# Patient Record
Sex: Female | Born: 1945 | Race: White | Hispanic: No | Marital: Married | Smoking: Former smoker
Health system: Southern US, Community
[De-identification: ages and names within clinical notes are randomized; demographics above are authoritative.]

## PROBLEM LIST (undated history)

## (undated) DIAGNOSIS — M4802 Spinal stenosis, cervical region: Secondary | ICD-10-CM

## (undated) DIAGNOSIS — D649 Anemia, unspecified: Secondary | ICD-10-CM

## (undated) DIAGNOSIS — R51 Headache: Secondary | ICD-10-CM

## (undated) DIAGNOSIS — Z5189 Encounter for other specified aftercare: Secondary | ICD-10-CM

## (undated) DIAGNOSIS — K2270 Barrett's esophagus without dysplasia: Secondary | ICD-10-CM

## (undated) DIAGNOSIS — I10 Essential (primary) hypertension: Secondary | ICD-10-CM

## (undated) DIAGNOSIS — I4891 Unspecified atrial fibrillation: Secondary | ICD-10-CM

## (undated) DIAGNOSIS — C50919 Malignant neoplasm of unspecified site of unspecified female breast: Secondary | ICD-10-CM

## (undated) DIAGNOSIS — Z923 Personal history of irradiation: Secondary | ICD-10-CM

## (undated) DIAGNOSIS — I509 Heart failure, unspecified: Secondary | ICD-10-CM

## (undated) DIAGNOSIS — Z9221 Personal history of antineoplastic chemotherapy: Secondary | ICD-10-CM

## (undated) DIAGNOSIS — E079 Disorder of thyroid, unspecified: Secondary | ICD-10-CM

## (undated) DIAGNOSIS — C189 Malignant neoplasm of colon, unspecified: Secondary | ICD-10-CM

## (undated) DIAGNOSIS — K649 Unspecified hemorrhoids: Secondary | ICD-10-CM

## (undated) DIAGNOSIS — R2230 Localized swelling, mass and lump, unspecified upper limb: Secondary | ICD-10-CM

## (undated) HISTORY — PX: COLONOSCOPY: SHX174

## (undated) HISTORY — DX: Barrett's esophagus without dysplasia: K22.70

## (undated) HISTORY — DX: Malignant neoplasm of unspecified site of unspecified female breast: C50.919

## (undated) HISTORY — DX: Disorder of thyroid, unspecified: E07.9

## (undated) HISTORY — DX: Encounter for other specified aftercare: Z51.89

## (undated) HISTORY — PX: INGUINAL HERNIA REPAIR: SUR1180

## (undated) HISTORY — DX: Unspecified atrial fibrillation: I48.91

## (undated) HISTORY — PX: COLON SURGERY: SHX602

## (undated) HISTORY — DX: Headache: R51

## (undated) HISTORY — DX: Localized swelling, mass and lump, unspecified upper limb: R22.30

## (undated) HISTORY — DX: Malignant neoplasm of colon, unspecified: C18.9

## (undated) HISTORY — DX: Spinal stenosis, cervical region: M48.02

## (undated) HISTORY — DX: Heart failure, unspecified: I50.9

## (undated) HISTORY — PX: BUNIONECTOMY: SHX129

## (undated) HISTORY — DX: Essential (primary) hypertension: I10

## (undated) HISTORY — DX: Unspecified hemorrhoids: K64.9

## (undated) HISTORY — DX: Anemia, unspecified: D64.9

---

## 1993-07-29 DIAGNOSIS — C50919 Malignant neoplasm of unspecified site of unspecified female breast: Secondary | ICD-10-CM

## 1993-07-29 HISTORY — DX: Malignant neoplasm of unspecified site of unspecified female breast: C50.919

## 1994-05-29 HISTORY — PX: BREAST LUMPECTOMY: SHX2

## 1994-07-29 HISTORY — PX: TOTAL ABDOMINAL HYSTERECTOMY W/ BILATERAL SALPINGOOPHORECTOMY: SHX83

## 1994-08-30 HISTORY — PX: HYSTEROSCOPY: SHX211

## 1997-03-29 HISTORY — PX: TUNNELED VENOUS CATHETER PLACEMENT: SHX818

## 1997-07-29 HISTORY — PX: BONE MARROW TRANSPLANT: SHX200

## 1998-01-26 DIAGNOSIS — R2230 Localized swelling, mass and lump, unspecified upper limb: Secondary | ICD-10-CM

## 1998-01-26 HISTORY — DX: Localized swelling, mass and lump, unspecified upper limb: R22.30

## 1998-01-26 HISTORY — PX: NECK MASS EXCISION: SHX2079

## 1998-02-03 ENCOUNTER — Ambulatory Visit (HOSPITAL_BASED_OUTPATIENT_CLINIC_OR_DEPARTMENT_OTHER): Admission: RE | Admit: 1998-02-03 | Discharge: 1998-02-03 | Payer: Self-pay | Admitting: General Surgery

## 1998-04-25 ENCOUNTER — Ambulatory Visit (HOSPITAL_COMMUNITY): Admission: RE | Admit: 1998-04-25 | Discharge: 1998-04-25 | Payer: Self-pay | Admitting: General Surgery

## 1998-04-25 ENCOUNTER — Encounter: Payer: Self-pay | Admitting: General Surgery

## 1998-09-13 ENCOUNTER — Other Ambulatory Visit: Admission: RE | Admit: 1998-09-13 | Discharge: 1998-09-13 | Payer: Self-pay | Admitting: *Deleted

## 1999-04-27 ENCOUNTER — Ambulatory Visit (HOSPITAL_COMMUNITY): Admission: RE | Admit: 1999-04-27 | Discharge: 1999-04-27 | Payer: Self-pay | Admitting: Family Medicine

## 1999-04-27 ENCOUNTER — Encounter: Payer: Self-pay | Admitting: Family Medicine

## 1999-05-25 ENCOUNTER — Ambulatory Visit (HOSPITAL_COMMUNITY): Admission: RE | Admit: 1999-05-25 | Discharge: 1999-05-25 | Payer: Self-pay | Admitting: Family Medicine

## 2000-04-29 ENCOUNTER — Encounter: Payer: Self-pay | Admitting: Family Medicine

## 2000-04-29 ENCOUNTER — Encounter: Admission: RE | Admit: 2000-04-29 | Discharge: 2000-04-29 | Payer: Self-pay | Admitting: Family Medicine

## 2000-10-01 ENCOUNTER — Encounter: Payer: Self-pay | Admitting: Internal Medicine

## 2000-10-01 ENCOUNTER — Encounter: Admission: RE | Admit: 2000-10-01 | Discharge: 2000-10-01 | Payer: Self-pay | Admitting: Internal Medicine

## 2001-04-22 ENCOUNTER — Ambulatory Visit (HOSPITAL_COMMUNITY): Admission: RE | Admit: 2001-04-22 | Discharge: 2001-04-22 | Payer: Self-pay | Admitting: Family Medicine

## 2001-04-22 ENCOUNTER — Encounter: Payer: Self-pay | Admitting: Family Medicine

## 2001-04-30 ENCOUNTER — Encounter: Payer: Self-pay | Admitting: Family Medicine

## 2001-04-30 ENCOUNTER — Encounter: Admission: RE | Admit: 2001-04-30 | Discharge: 2001-04-30 | Payer: Self-pay | Admitting: Family Medicine

## 2002-03-03 ENCOUNTER — Other Ambulatory Visit: Admission: RE | Admit: 2002-03-03 | Discharge: 2002-03-03 | Payer: Self-pay | Admitting: *Deleted

## 2002-05-05 ENCOUNTER — Encounter: Admission: RE | Admit: 2002-05-05 | Discharge: 2002-05-05 | Payer: Self-pay | Admitting: Family Medicine

## 2002-05-05 ENCOUNTER — Encounter: Payer: Self-pay | Admitting: Family Medicine

## 2002-07-06 ENCOUNTER — Encounter: Payer: Self-pay | Admitting: Family Medicine

## 2002-07-06 ENCOUNTER — Ambulatory Visit (HOSPITAL_COMMUNITY): Admission: RE | Admit: 2002-07-06 | Discharge: 2002-07-06 | Payer: Self-pay | Admitting: Family Medicine

## 2002-09-09 ENCOUNTER — Encounter: Payer: Self-pay | Admitting: Family Medicine

## 2002-09-09 ENCOUNTER — Encounter: Admission: RE | Admit: 2002-09-09 | Discharge: 2002-09-09 | Payer: Self-pay | Admitting: Family Medicine

## 2003-03-10 ENCOUNTER — Other Ambulatory Visit: Admission: RE | Admit: 2003-03-10 | Discharge: 2003-03-10 | Payer: Self-pay | Admitting: *Deleted

## 2003-03-30 ENCOUNTER — Encounter: Payer: Self-pay | Admitting: Emergency Medicine

## 2003-03-31 ENCOUNTER — Encounter: Payer: Self-pay | Admitting: *Deleted

## 2003-03-31 ENCOUNTER — Encounter (INDEPENDENT_AMBULATORY_CARE_PROVIDER_SITE_OTHER): Payer: Self-pay | Admitting: Cardiology

## 2003-03-31 ENCOUNTER — Inpatient Hospital Stay (HOSPITAL_COMMUNITY): Admission: AD | Admit: 2003-03-31 | Discharge: 2003-04-01 | Payer: Self-pay | Admitting: Internal Medicine

## 2003-05-10 ENCOUNTER — Encounter: Payer: Self-pay | Admitting: Family Medicine

## 2003-05-10 ENCOUNTER — Encounter: Admission: RE | Admit: 2003-05-10 | Discharge: 2003-05-10 | Payer: Self-pay | Admitting: Family Medicine

## 2003-11-28 ENCOUNTER — Ambulatory Visit (HOSPITAL_BASED_OUTPATIENT_CLINIC_OR_DEPARTMENT_OTHER): Admission: RE | Admit: 2003-11-28 | Discharge: 2003-11-28 | Payer: Self-pay | Admitting: General Surgery

## 2003-11-28 ENCOUNTER — Encounter (INDEPENDENT_AMBULATORY_CARE_PROVIDER_SITE_OTHER): Payer: Self-pay | Admitting: *Deleted

## 2003-11-28 ENCOUNTER — Ambulatory Visit (HOSPITAL_COMMUNITY): Admission: RE | Admit: 2003-11-28 | Discharge: 2003-11-28 | Payer: Self-pay | Admitting: General Surgery

## 2004-01-13 ENCOUNTER — Ambulatory Visit (HOSPITAL_COMMUNITY): Admission: RE | Admit: 2004-01-13 | Discharge: 2004-01-13 | Payer: Self-pay | Admitting: Family Medicine

## 2004-05-10 ENCOUNTER — Encounter: Admission: RE | Admit: 2004-05-10 | Discharge: 2004-05-10 | Payer: Self-pay | Admitting: *Deleted

## 2004-07-17 ENCOUNTER — Ambulatory Visit: Payer: Self-pay | Admitting: Oncology

## 2004-07-29 DIAGNOSIS — C189 Malignant neoplasm of colon, unspecified: Secondary | ICD-10-CM

## 2004-07-29 HISTORY — DX: Malignant neoplasm of colon, unspecified: C18.9

## 2004-10-16 ENCOUNTER — Ambulatory Visit: Payer: Self-pay | Admitting: Oncology

## 2004-11-22 ENCOUNTER — Ambulatory Visit (HOSPITAL_COMMUNITY): Admission: RE | Admit: 2004-11-22 | Discharge: 2004-11-22 | Payer: Self-pay | Admitting: Family Medicine

## 2005-01-15 ENCOUNTER — Ambulatory Visit: Payer: Self-pay | Admitting: Hematology and Oncology

## 2005-01-26 DIAGNOSIS — C189 Malignant neoplasm of colon, unspecified: Secondary | ICD-10-CM | POA: Insufficient documentation

## 2005-01-26 HISTORY — DX: Malignant neoplasm of colon, unspecified: C18.9

## 2005-01-26 HISTORY — PX: OTHER SURGICAL HISTORY: SHX169

## 2005-01-28 ENCOUNTER — Ambulatory Visit: Payer: Self-pay | Admitting: Internal Medicine

## 2005-02-01 ENCOUNTER — Inpatient Hospital Stay (HOSPITAL_COMMUNITY): Admission: EM | Admit: 2005-02-01 | Discharge: 2005-02-08 | Payer: Self-pay | Admitting: Internal Medicine

## 2005-02-01 ENCOUNTER — Ambulatory Visit: Payer: Self-pay | Admitting: Internal Medicine

## 2005-02-01 ENCOUNTER — Encounter (INDEPENDENT_AMBULATORY_CARE_PROVIDER_SITE_OTHER): Payer: Self-pay | Admitting: *Deleted

## 2005-02-02 ENCOUNTER — Ambulatory Visit: Payer: Self-pay | Admitting: Gastroenterology

## 2005-02-03 ENCOUNTER — Encounter (INDEPENDENT_AMBULATORY_CARE_PROVIDER_SITE_OTHER): Payer: Self-pay | Admitting: *Deleted

## 2005-02-03 ENCOUNTER — Ambulatory Visit: Payer: Self-pay | Admitting: Hematology and Oncology

## 2005-03-15 ENCOUNTER — Ambulatory Visit (HOSPITAL_COMMUNITY): Admission: RE | Admit: 2005-03-15 | Discharge: 2005-03-15 | Payer: Self-pay | Admitting: Hematology and Oncology

## 2005-03-21 ENCOUNTER — Ambulatory Visit: Payer: Self-pay | Admitting: Hematology and Oncology

## 2005-05-13 ENCOUNTER — Encounter: Admission: RE | Admit: 2005-05-13 | Discharge: 2005-05-13 | Payer: Self-pay | Admitting: Family Medicine

## 2005-05-14 ENCOUNTER — Ambulatory Visit: Payer: Self-pay | Admitting: Hematology and Oncology

## 2005-07-03 ENCOUNTER — Ambulatory Visit: Payer: Self-pay | Admitting: Hematology and Oncology

## 2005-08-20 ENCOUNTER — Ambulatory Visit (HOSPITAL_COMMUNITY): Admission: RE | Admit: 2005-08-20 | Discharge: 2005-08-20 | Payer: Self-pay | Admitting: Hematology and Oncology

## 2005-08-22 ENCOUNTER — Ambulatory Visit: Payer: Self-pay | Admitting: Hematology and Oncology

## 2005-08-23 ENCOUNTER — Ambulatory Visit (HOSPITAL_COMMUNITY): Admission: RE | Admit: 2005-08-23 | Discharge: 2005-08-23 | Payer: Self-pay | Admitting: Family Medicine

## 2005-11-01 ENCOUNTER — Ambulatory Visit (HOSPITAL_COMMUNITY): Admission: RE | Admit: 2005-11-01 | Discharge: 2005-11-01 | Payer: Self-pay | Admitting: Hematology and Oncology

## 2005-11-05 ENCOUNTER — Ambulatory Visit: Payer: Self-pay | Admitting: Cardiology

## 2005-11-07 ENCOUNTER — Ambulatory Visit: Payer: Self-pay | Admitting: Hematology and Oncology

## 2005-11-08 LAB — CBC WITH DIFFERENTIAL/PLATELET
BASO%: 0.4 % (ref 0.0–2.0)
Basophils Absolute: 0 10*3/uL (ref 0.0–0.1)
EOS%: 1.2 % (ref 0.0–7.0)
Eosinophils Absolute: 0.1 10*3/uL (ref 0.0–0.5)
HCT: 37.9 % (ref 34.8–46.6)
HGB: 12.6 g/dL (ref 11.6–15.9)
LYMPH%: 27.8 % (ref 14.0–48.0)
MCH: 29.3 pg (ref 26.0–34.0)
MCHC: 33.3 g/dL (ref 32.0–36.0)
MCV: 88 fL (ref 81.0–101.0)
MONO#: 0.4 10*3/uL (ref 0.1–0.9)
MONO%: 6.8 % (ref 0.0–13.0)
NEUT#: 4.2 10*3/uL (ref 1.5–6.5)
NEUT%: 63.8 % (ref 39.6–76.8)
Platelets: 218 10*3/uL (ref 145–400)
RBC: 4.3 10*6/uL (ref 3.70–5.32)
RDW: 12.6 % (ref 11.3–14.5)
WBC: 6.5 10*3/uL (ref 3.9–10.0)
lymph#: 1.8 10*3/uL (ref 0.9–3.3)

## 2005-11-08 LAB — COMPREHENSIVE METABOLIC PANEL
ALT: 29 U/L (ref 0–40)
AST: 27 U/L (ref 0–37)
Albumin: 4.1 g/dL (ref 3.5–5.2)
Alkaline Phosphatase: 76 U/L (ref 39–117)
BUN: 15 mg/dL (ref 6–23)
CO2: 26 mEq/L (ref 19–32)
Calcium: 9.5 mg/dL (ref 8.4–10.5)
Chloride: 105 mEq/L (ref 96–112)
Creatinine, Ser: 0.8 mg/dL (ref 0.4–1.2)
Glucose, Bld: 103 mg/dL — ABNORMAL HIGH (ref 70–99)
Potassium: 4.9 mEq/L (ref 3.5–5.3)
Sodium: 140 mEq/L (ref 135–145)
Total Bilirubin: 0.6 mg/dL (ref 0.3–1.2)
Total Protein: 6.7 g/dL (ref 6.0–8.3)

## 2005-11-08 LAB — LACTATE DEHYDROGENASE: LDH: 158 U/L (ref 94–250)

## 2005-11-08 LAB — CEA: CEA: 1.6 ng/mL (ref 0.0–5.0)

## 2005-11-08 LAB — CANCER ANTIGEN 27.29: CA 27.29: 33 U/mL (ref 0–39)

## 2005-11-20 ENCOUNTER — Ambulatory Visit: Payer: Self-pay

## 2005-11-20 ENCOUNTER — Encounter: Payer: Self-pay | Admitting: Cardiology

## 2005-12-09 ENCOUNTER — Ambulatory Visit (HOSPITAL_COMMUNITY): Admission: RE | Admit: 2005-12-09 | Discharge: 2005-12-09 | Payer: Self-pay | Admitting: Family Medicine

## 2006-01-01 ENCOUNTER — Ambulatory Visit: Payer: Self-pay | Admitting: Internal Medicine

## 2006-01-23 ENCOUNTER — Ambulatory Visit: Payer: Self-pay | Admitting: Internal Medicine

## 2006-01-31 ENCOUNTER — Ambulatory Visit: Payer: Self-pay | Admitting: Hematology and Oncology

## 2006-02-04 ENCOUNTER — Ambulatory Visit (HOSPITAL_COMMUNITY): Admission: RE | Admit: 2006-02-04 | Discharge: 2006-02-04 | Payer: Self-pay | Admitting: Family Medicine

## 2006-02-07 LAB — CBC WITH DIFFERENTIAL/PLATELET
BASO%: 0.4 % (ref 0.0–2.0)
Basophils Absolute: 0 10*3/uL (ref 0.0–0.1)
EOS%: 2.1 % (ref 0.0–7.0)
Eosinophils Absolute: 0.1 10*3/uL (ref 0.0–0.5)
HCT: 38.6 % (ref 34.8–46.6)
HGB: 12.9 g/dL (ref 11.6–15.9)
LYMPH%: 32.5 % (ref 14.0–48.0)
MCH: 29.2 pg (ref 26.0–34.0)
MCHC: 33.4 g/dL (ref 32.0–36.0)
MCV: 87.5 fL (ref 81.0–101.0)
MONO#: 0.4 10*3/uL (ref 0.1–0.9)
MONO%: 6.9 % (ref 0.0–13.0)
NEUT#: 3.2 10*3/uL (ref 1.5–6.5)
NEUT%: 58.1 % (ref 39.6–76.8)
Platelets: 217 10*3/uL (ref 145–400)
RBC: 4.42 10*6/uL (ref 3.70–5.32)
RDW: 13.4 % (ref 11.3–14.5)
WBC: 5.5 10*3/uL (ref 3.9–10.0)
lymph#: 1.8 10*3/uL (ref 0.9–3.3)

## 2006-02-07 LAB — LACTATE DEHYDROGENASE: LDH: 151 U/L (ref 94–250)

## 2006-02-07 LAB — COMPREHENSIVE METABOLIC PANEL
ALT: 24 U/L (ref 0–40)
AST: 23 U/L (ref 0–37)
Albumin: 4.2 g/dL (ref 3.5–5.2)
Alkaline Phosphatase: 65 U/L (ref 39–117)
BUN: 16 mg/dL (ref 6–23)
CO2: 28 mEq/L (ref 19–32)
Calcium: 9.5 mg/dL (ref 8.4–10.5)
Chloride: 104 mEq/L (ref 96–112)
Creatinine, Ser: 0.84 mg/dL (ref 0.40–1.20)
Glucose, Bld: 105 mg/dL — ABNORMAL HIGH (ref 70–99)
Potassium: 4.9 mEq/L (ref 3.5–5.3)
Sodium: 141 mEq/L (ref 135–145)
Total Bilirubin: 0.7 mg/dL (ref 0.3–1.2)
Total Protein: 6.4 g/dL (ref 6.0–8.3)

## 2006-02-07 LAB — CEA: CEA: 1.9 ng/mL (ref 0.0–5.0)

## 2006-02-07 LAB — CANCER ANTIGEN 27.29: CA 27.29: 33 U/mL (ref 0–39)

## 2006-05-07 ENCOUNTER — Ambulatory Visit: Payer: Self-pay | Admitting: Hematology and Oncology

## 2006-05-09 LAB — CBC WITH DIFFERENTIAL/PLATELET
BASO%: 0.7 % (ref 0.0–2.0)
Basophils Absolute: 0 10*3/uL (ref 0.0–0.1)
EOS%: 0.9 % (ref 0.0–7.0)
Eosinophils Absolute: 0 10*3/uL (ref 0.0–0.5)
HCT: 38.9 % (ref 34.8–46.6)
HGB: 13 g/dL (ref 11.6–15.9)
LYMPH%: 26.5 % (ref 14.0–48.0)
MCH: 29.3 pg (ref 26.0–34.0)
MCHC: 33.5 g/dL (ref 32.0–36.0)
MCV: 87.7 fL (ref 81.0–101.0)
MONO#: 0.3 10*3/uL (ref 0.1–0.9)
MONO%: 6.2 % (ref 0.0–13.0)
NEUT#: 3.7 10*3/uL (ref 1.5–6.5)
NEUT%: 65.7 % (ref 39.6–76.8)
Platelets: 223 10*3/uL (ref 145–400)
RBC: 4.44 10*6/uL (ref 3.70–5.32)
RDW: 13 % (ref 11.3–14.5)
WBC: 5.6 10*3/uL (ref 3.9–10.0)
lymph#: 1.5 10*3/uL (ref 0.9–3.3)

## 2006-05-09 LAB — COMPREHENSIVE METABOLIC PANEL
ALT: 23 U/L (ref 0–40)
AST: 22 U/L (ref 0–37)
Albumin: 4.2 g/dL (ref 3.5–5.2)
Alkaline Phosphatase: 66 U/L (ref 39–117)
BUN: 12 mg/dL (ref 6–23)
CO2: 28 mEq/L (ref 19–32)
Calcium: 9.6 mg/dL (ref 8.4–10.5)
Chloride: 104 mEq/L (ref 96–112)
Creatinine, Ser: 0.84 mg/dL (ref 0.40–1.20)
Glucose, Bld: 91 mg/dL (ref 70–99)
Potassium: 5.1 mEq/L (ref 3.5–5.3)
Sodium: 140 mEq/L (ref 135–145)
Total Bilirubin: 0.8 mg/dL (ref 0.3–1.2)
Total Protein: 6.7 g/dL (ref 6.0–8.3)

## 2006-05-09 LAB — CEA: CEA: 1.8 ng/mL (ref 0.0–5.0)

## 2006-05-09 LAB — LACTATE DEHYDROGENASE: LDH: 151 U/L (ref 94–250)

## 2006-05-09 LAB — CANCER ANTIGEN 27.29: CA 27.29: 36 U/mL (ref 0–39)

## 2006-05-14 ENCOUNTER — Encounter: Admission: RE | Admit: 2006-05-14 | Discharge: 2006-05-14 | Payer: Self-pay | Admitting: Family Medicine

## 2006-05-27 ENCOUNTER — Other Ambulatory Visit: Admission: RE | Admit: 2006-05-27 | Discharge: 2006-05-27 | Payer: Self-pay | Admitting: Obstetrics & Gynecology

## 2006-07-21 ENCOUNTER — Ambulatory Visit: Payer: Self-pay | Admitting: Internal Medicine

## 2006-08-05 ENCOUNTER — Ambulatory Visit: Payer: Self-pay | Admitting: Hematology and Oncology

## 2006-08-08 ENCOUNTER — Ambulatory Visit (HOSPITAL_COMMUNITY): Admission: RE | Admit: 2006-08-08 | Discharge: 2006-08-08 | Payer: Self-pay | Admitting: Hematology and Oncology

## 2006-08-08 LAB — COMPREHENSIVE METABOLIC PANEL
ALT: 20 U/L (ref 0–35)
AST: 21 U/L (ref 0–37)
Albumin: 4.2 g/dL (ref 3.5–5.2)
Alkaline Phosphatase: 69 U/L (ref 39–117)
BUN: 15 mg/dL (ref 6–23)
CO2: 24 mEq/L (ref 19–32)
Calcium: 9.4 mg/dL (ref 8.4–10.5)
Chloride: 104 mEq/L (ref 96–112)
Creatinine, Ser: 0.8 mg/dL (ref 0.40–1.20)
Glucose, Bld: 82 mg/dL (ref 70–99)
Potassium: 4.2 mEq/L (ref 3.5–5.3)
Sodium: 138 mEq/L (ref 135–145)
Total Bilirubin: 0.7 mg/dL (ref 0.3–1.2)
Total Protein: 6.6 g/dL (ref 6.0–8.3)

## 2006-08-08 LAB — CBC WITH DIFFERENTIAL/PLATELET
BASO%: 0.4 % (ref 0.0–2.0)
Basophils Absolute: 0 10*3/uL (ref 0.0–0.1)
EOS%: 1.1 % (ref 0.0–7.0)
Eosinophils Absolute: 0.1 10*3/uL (ref 0.0–0.5)
HCT: 39 % (ref 34.8–46.6)
HGB: 12.8 g/dL (ref 11.6–15.9)
LYMPH%: 24.3 % (ref 14.0–48.0)
MCH: 28.8 pg (ref 26.0–34.0)
MCHC: 33 g/dL (ref 32.0–36.0)
MCV: 87.3 fL (ref 81.0–101.0)
MONO#: 0.4 10*3/uL (ref 0.1–0.9)
MONO%: 7.6 % (ref 0.0–13.0)
NEUT#: 3.8 10*3/uL (ref 1.5–6.5)
NEUT%: 66.6 % (ref 39.6–76.8)
Platelets: 224 10*3/uL (ref 145–400)
RBC: 4.46 10*6/uL (ref 3.70–5.32)
RDW: 12.8 % (ref 11.3–14.5)
WBC: 5.7 10*3/uL (ref 3.9–10.0)
lymph#: 1.4 10*3/uL (ref 0.9–3.3)

## 2006-08-08 LAB — CEA: CEA: 2.1 ng/mL (ref 0.0–5.0)

## 2006-08-08 LAB — CANCER ANTIGEN 27.29: CA 27.29: 34 U/mL (ref 0–39)

## 2006-12-08 ENCOUNTER — Ambulatory Visit: Payer: Self-pay | Admitting: Hematology and Oncology

## 2006-12-10 LAB — CBC WITH DIFFERENTIAL/PLATELET
BASO%: 0.5 % (ref 0.0–2.0)
Basophils Absolute: 0 10*3/uL (ref 0.0–0.1)
EOS%: 1.2 % (ref 0.0–7.0)
Eosinophils Absolute: 0.1 10*3/uL (ref 0.0–0.5)
HCT: 38.1 % (ref 34.8–46.6)
HGB: 12.8 g/dL (ref 11.6–15.9)
LYMPH%: 33 % (ref 14.0–48.0)
MCH: 29 pg (ref 26.0–34.0)
MCHC: 33.7 g/dL (ref 32.0–36.0)
MCV: 86.1 fL (ref 81.0–101.0)
MONO#: 0.5 10*3/uL (ref 0.1–0.9)
MONO%: 7.8 % (ref 0.0–13.0)
NEUT#: 3.7 10*3/uL (ref 1.5–6.5)
NEUT%: 57.5 % (ref 39.6–76.8)
Platelets: 234 10*3/uL (ref 145–400)
RBC: 4.43 10*6/uL (ref 3.70–5.32)
RDW: 12.7 % (ref 11.3–14.5)
WBC: 6.4 10*3/uL (ref 3.9–10.0)
lymph#: 2.1 10*3/uL (ref 0.9–3.3)

## 2006-12-10 LAB — COMPREHENSIVE METABOLIC PANEL
ALT: 19 U/L (ref 0–35)
AST: 20 U/L (ref 0–37)
Albumin: 4.2 g/dL (ref 3.5–5.2)
Alkaline Phosphatase: 62 U/L (ref 39–117)
BUN: 14 mg/dL (ref 6–23)
CO2: 28 mEq/L (ref 19–32)
Calcium: 9.8 mg/dL (ref 8.4–10.5)
Chloride: 103 mEq/L (ref 96–112)
Creatinine, Ser: 0.75 mg/dL (ref 0.40–1.20)
Glucose, Bld: 95 mg/dL (ref 70–99)
Potassium: 4.5 mEq/L (ref 3.5–5.3)
Sodium: 141 mEq/L (ref 135–145)
Total Bilirubin: 0.6 mg/dL (ref 0.3–1.2)
Total Protein: 6.6 g/dL (ref 6.0–8.3)

## 2006-12-10 LAB — CEA: CEA: 1.4 ng/mL (ref 0.0–5.0)

## 2006-12-10 LAB — CANCER ANTIGEN 27.29: CA 27.29: 32 U/mL (ref 0–39)

## 2006-12-10 LAB — LACTATE DEHYDROGENASE: LDH: 148 U/L (ref 94–250)

## 2007-02-20 ENCOUNTER — Ambulatory Visit: Payer: Self-pay | Admitting: Internal Medicine

## 2007-02-27 ENCOUNTER — Encounter: Payer: Self-pay | Admitting: Internal Medicine

## 2007-02-27 ENCOUNTER — Ambulatory Visit (HOSPITAL_COMMUNITY): Admission: RE | Admit: 2007-02-27 | Discharge: 2007-02-27 | Payer: Self-pay | Admitting: Internal Medicine

## 2007-03-24 ENCOUNTER — Ambulatory Visit: Payer: Self-pay | Admitting: Internal Medicine

## 2007-04-08 ENCOUNTER — Ambulatory Visit: Payer: Self-pay | Admitting: Hematology and Oncology

## 2007-04-10 LAB — CANCER ANTIGEN 27.29: CA 27.29: 41 U/mL — ABNORMAL HIGH (ref 0–39)

## 2007-04-10 LAB — CBC WITH DIFFERENTIAL/PLATELET
BASO%: 0.7 % (ref 0.0–2.0)
Basophils Absolute: 0 10*3/uL (ref 0.0–0.1)
EOS%: 1 % (ref 0.0–7.0)
Eosinophils Absolute: 0.1 10*3/uL (ref 0.0–0.5)
HCT: 37.9 % (ref 34.8–46.6)
HGB: 12.9 g/dL (ref 11.6–15.9)
LYMPH%: 32.1 % (ref 14.0–48.0)
MCH: 29 pg (ref 26.0–34.0)
MCHC: 34.1 g/dL (ref 32.0–36.0)
MCV: 85.1 fL (ref 81.0–101.0)
MONO#: 0.5 10*3/uL (ref 0.1–0.9)
MONO%: 7.8 % (ref 0.0–13.0)
NEUT#: 3.8 10*3/uL (ref 1.5–6.5)
NEUT%: 58.4 % (ref 39.6–76.8)
Platelets: 239 10*3/uL (ref 145–400)
RBC: 4.45 10*6/uL (ref 3.70–5.32)
RDW: 10.5 % — ABNORMAL LOW (ref 11.3–14.5)
WBC: 6.6 10*3/uL (ref 3.9–10.0)
lymph#: 2.1 10*3/uL (ref 0.9–3.3)

## 2007-04-10 LAB — LACTATE DEHYDROGENASE: LDH: 145 U/L (ref 94–250)

## 2007-04-10 LAB — COMPREHENSIVE METABOLIC PANEL
ALT: 24 U/L (ref 0–35)
AST: 23 U/L (ref 0–37)
Albumin: 3.8 g/dL (ref 3.5–5.2)
Alkaline Phosphatase: 65 U/L (ref 39–117)
BUN: 11 mg/dL (ref 6–23)
CO2: 33 mEq/L — ABNORMAL HIGH (ref 19–32)
Calcium: 9.5 mg/dL (ref 8.4–10.5)
Chloride: 102 mEq/L (ref 96–112)
Creatinine, Ser: 0.81 mg/dL (ref 0.40–1.20)
Glucose, Bld: 98 mg/dL (ref 70–99)
Potassium: 4.1 mEq/L (ref 3.5–5.3)
Sodium: 139 mEq/L (ref 135–145)
Total Bilirubin: 0.9 mg/dL (ref 0.3–1.2)
Total Protein: 6.6 g/dL (ref 6.0–8.3)

## 2007-04-10 LAB — CEA: CEA: 1.6 ng/mL (ref 0.0–5.0)

## 2007-04-14 ENCOUNTER — Ambulatory Visit (HOSPITAL_COMMUNITY): Admission: RE | Admit: 2007-04-14 | Discharge: 2007-04-14 | Payer: Self-pay | Admitting: Hematology and Oncology

## 2007-06-08 ENCOUNTER — Encounter: Admission: RE | Admit: 2007-06-08 | Discharge: 2007-06-08 | Payer: Self-pay

## 2007-08-05 ENCOUNTER — Ambulatory Visit: Payer: Self-pay | Admitting: Hematology and Oncology

## 2007-08-07 LAB — CBC WITH DIFFERENTIAL/PLATELET
BASO%: 0 % (ref 0.0–2.0)
Basophils Absolute: 0 10*3/uL (ref 0.0–0.1)
EOS%: 0.9 % (ref 0.0–7.0)
Eosinophils Absolute: 0.1 10*3/uL (ref 0.0–0.5)
HCT: 39 % (ref 34.8–46.6)
HGB: 13 g/dL (ref 11.6–15.9)
LYMPH%: 36.7 % (ref 14.0–48.0)
MCH: 28.7 pg (ref 26.0–34.0)
MCHC: 33.4 g/dL (ref 32.0–36.0)
MCV: 86.1 fL (ref 81.0–101.0)
MONO#: 0.5 10*3/uL (ref 0.1–0.9)
MONO%: 6.7 % (ref 0.0–13.0)
NEUT#: 3.9 10*3/uL (ref 1.5–6.5)
NEUT%: 55.7 % (ref 39.6–76.8)
Platelets: 241 10*3/uL (ref 145–400)
RBC: 4.53 10*6/uL (ref 3.70–5.32)
RDW: 13.2 % (ref 11.3–14.5)
WBC: 6.9 10*3/uL (ref 3.9–10.0)
lymph#: 2.5 10*3/uL (ref 0.9–3.3)

## 2007-08-07 LAB — COMPREHENSIVE METABOLIC PANEL
ALT: 21 U/L (ref 0–35)
AST: 19 U/L (ref 0–37)
Albumin: 4.5 g/dL (ref 3.5–5.2)
Alkaline Phosphatase: 65 U/L (ref 39–117)
BUN: 12 mg/dL (ref 6–23)
CO2: 27 mEq/L (ref 19–32)
Calcium: 9.7 mg/dL (ref 8.4–10.5)
Chloride: 103 mEq/L (ref 96–112)
Creatinine, Ser: 0.82 mg/dL (ref 0.40–1.20)
Glucose, Bld: 88 mg/dL (ref 70–99)
Potassium: 4.1 mEq/L (ref 3.5–5.3)
Sodium: 140 mEq/L (ref 135–145)
Total Bilirubin: 0.5 mg/dL (ref 0.3–1.2)
Total Protein: 6.9 g/dL (ref 6.0–8.3)

## 2007-08-07 LAB — CEA: CEA: 1.5 ng/mL (ref 0.0–5.0)

## 2007-08-07 LAB — LACTATE DEHYDROGENASE: LDH: 164 U/L (ref 94–250)

## 2007-08-07 LAB — CANCER ANTIGEN 27.29: CA 27.29: 31 U/mL (ref 0–39)

## 2007-08-10 ENCOUNTER — Ambulatory Visit (HOSPITAL_COMMUNITY): Admission: RE | Admit: 2007-08-10 | Discharge: 2007-08-10 | Payer: Self-pay | Admitting: Hematology and Oncology

## 2007-10-14 ENCOUNTER — Ambulatory Visit (HOSPITAL_COMMUNITY): Admission: RE | Admit: 2007-10-14 | Discharge: 2007-10-15 | Payer: Self-pay | Admitting: Orthopedic Surgery

## 2007-12-03 ENCOUNTER — Ambulatory Visit: Payer: Self-pay | Admitting: Hematology and Oncology

## 2007-12-07 LAB — CBC WITH DIFFERENTIAL/PLATELET
BASO%: 0.9 % (ref 0.0–2.0)
Basophils Absolute: 0.1 10*3/uL (ref 0.0–0.1)
EOS%: 0.5 % (ref 0.0–7.0)
Eosinophils Absolute: 0 10*3/uL (ref 0.0–0.5)
HCT: 39.3 % (ref 34.8–46.6)
HGB: 13.2 g/dL (ref 11.6–15.9)
LYMPH%: 21.8 % (ref 14.0–48.0)
MCH: 29.4 pg (ref 26.0–34.0)
MCHC: 33.6 g/dL (ref 32.0–36.0)
MCV: 87.4 fL (ref 81.0–101.0)
MONO#: 0.4 10*3/uL (ref 0.1–0.9)
MONO%: 6.7 % (ref 0.0–13.0)
NEUT#: 4.6 10*3/uL (ref 1.5–6.5)
NEUT%: 70.1 % (ref 39.6–76.8)
Platelets: 234 10*3/uL (ref 145–400)
RBC: 4.49 10*6/uL (ref 3.70–5.32)
RDW: 14.5 % (ref 11.3–14.5)
WBC: 6.6 10*3/uL (ref 3.9–10.0)
lymph#: 1.4 10*3/uL (ref 0.9–3.3)

## 2007-12-07 LAB — LACTATE DEHYDROGENASE: LDH: 165 U/L (ref 94–250)

## 2007-12-07 LAB — COMPREHENSIVE METABOLIC PANEL
ALT: 30 U/L (ref 0–35)
AST: 24 U/L (ref 0–37)
Albumin: 4.4 g/dL (ref 3.5–5.2)
Alkaline Phosphatase: 73 U/L (ref 39–117)
BUN: 14 mg/dL (ref 6–23)
CO2: 24 mEq/L (ref 19–32)
Calcium: 9.8 mg/dL (ref 8.4–10.5)
Chloride: 106 mEq/L (ref 96–112)
Creatinine, Ser: 0.68 mg/dL (ref 0.40–1.20)
Glucose, Bld: 87 mg/dL (ref 70–99)
Potassium: 4.4 mEq/L (ref 3.5–5.3)
Sodium: 142 mEq/L (ref 135–145)
Total Bilirubin: 0.6 mg/dL (ref 0.3–1.2)
Total Protein: 7 g/dL (ref 6.0–8.3)

## 2007-12-07 LAB — CEA: CEA: 2 ng/mL (ref 0.0–5.0)

## 2007-12-07 LAB — CANCER ANTIGEN 27.29: CA 27.29: 39 U/mL (ref 0–39)

## 2007-12-11 ENCOUNTER — Encounter: Payer: Self-pay | Admitting: Internal Medicine

## 2007-12-14 LAB — VITAMIN D 25 HYDROXY (VIT D DEFICIENCY, FRACTURES): Vit D, 25-Hydroxy: 27 ng/mL — ABNORMAL LOW (ref 30–89)

## 2008-05-27 ENCOUNTER — Ambulatory Visit (HOSPITAL_COMMUNITY): Admission: RE | Admit: 2008-05-27 | Discharge: 2008-05-28 | Payer: Self-pay | Admitting: Orthopedic Surgery

## 2008-06-09 ENCOUNTER — Ambulatory Visit: Payer: Self-pay | Admitting: Hematology and Oncology

## 2008-06-13 ENCOUNTER — Ambulatory Visit (HOSPITAL_COMMUNITY): Admission: RE | Admit: 2008-06-13 | Discharge: 2008-06-13 | Payer: Self-pay | Admitting: Hematology and Oncology

## 2008-06-13 ENCOUNTER — Encounter: Admission: RE | Admit: 2008-06-13 | Discharge: 2008-06-13 | Payer: Self-pay | Admitting: Obstetrics & Gynecology

## 2008-06-13 LAB — CBC WITH DIFFERENTIAL/PLATELET
BASO%: 0.4 % (ref 0.0–2.0)
Basophils Absolute: 0 10*3/uL (ref 0.0–0.1)
EOS%: 0.6 % (ref 0.0–7.0)
Eosinophils Absolute: 0 10*3/uL (ref 0.0–0.5)
HCT: 39.7 % (ref 34.8–46.6)
HGB: 13.2 g/dL (ref 11.6–15.9)
LYMPH%: 24.6 % (ref 14.0–48.0)
MCH: 29 pg (ref 26.0–34.0)
MCHC: 33.2 g/dL (ref 32.0–36.0)
MCV: 87.4 fL (ref 81.0–101.0)
MONO#: 0.4 10*3/uL (ref 0.1–0.9)
MONO%: 5.5 % (ref 0.0–13.0)
NEUT#: 4.6 10*3/uL (ref 1.5–6.5)
NEUT%: 68.9 % (ref 39.6–76.8)
Platelets: 231 10*3/uL (ref 145–400)
RBC: 4.54 10*6/uL (ref 3.70–5.32)
RDW: 13 % (ref 11.3–14.5)
WBC: 6.6 10*3/uL (ref 3.9–10.0)
lymph#: 1.6 10*3/uL (ref 0.9–3.3)

## 2008-06-13 LAB — COMPREHENSIVE METABOLIC PANEL
ALT: 26 U/L (ref 0–35)
AST: 27 U/L (ref 0–37)
Albumin: 3.9 g/dL (ref 3.5–5.2)
Alkaline Phosphatase: 65 U/L (ref 39–117)
BUN: 13 mg/dL (ref 6–23)
CO2: 30 mEq/L (ref 19–32)
Calcium: 9.7 mg/dL (ref 8.4–10.5)
Chloride: 105 mEq/L (ref 96–112)
Creatinine, Ser: 0.69 mg/dL (ref 0.40–1.20)
Glucose, Bld: 90 mg/dL (ref 70–99)
Potassium: 4.1 mEq/L (ref 3.5–5.3)
Sodium: 140 mEq/L (ref 135–145)
Total Bilirubin: 1 mg/dL (ref 0.3–1.2)
Total Protein: 6.6 g/dL (ref 6.0–8.3)

## 2008-06-13 LAB — CEA: CEA: 1.8 ng/mL (ref 0.0–5.0)

## 2008-06-13 LAB — CANCER ANTIGEN 27.29: CA 27.29: 42 U/mL — ABNORMAL HIGH (ref 0–39)

## 2008-06-15 LAB — URINALYSIS, MICROSCOPIC - CHCC
Bilirubin (Urine): NEGATIVE
Glucose: NEGATIVE g/dL
Ketones: NEGATIVE mg/dL
Leukocyte Esterase: NEGATIVE
Nitrite: NEGATIVE
Protein: NEGATIVE mg/dL
Specific Gravity, Urine: 1.005 (ref 1.003–1.035)
pH: 6 (ref 4.6–8.0)

## 2008-06-28 ENCOUNTER — Encounter: Payer: Self-pay | Admitting: Internal Medicine

## 2008-08-19 ENCOUNTER — Other Ambulatory Visit: Admission: RE | Admit: 2008-08-19 | Discharge: 2008-08-19 | Payer: Self-pay | Admitting: Obstetrics & Gynecology

## 2008-08-22 ENCOUNTER — Telehealth: Payer: Self-pay | Admitting: Internal Medicine

## 2008-08-23 ENCOUNTER — Ambulatory Visit: Payer: Self-pay | Admitting: Internal Medicine

## 2008-08-23 ENCOUNTER — Telehealth: Payer: Self-pay | Admitting: Physician Assistant

## 2008-08-23 DIAGNOSIS — R1031 Right lower quadrant pain: Secondary | ICD-10-CM | POA: Insufficient documentation

## 2008-08-23 DIAGNOSIS — K625 Hemorrhage of anus and rectum: Secondary | ICD-10-CM | POA: Insufficient documentation

## 2008-08-23 DIAGNOSIS — C50919 Malignant neoplasm of unspecified site of unspecified female breast: Secondary | ICD-10-CM | POA: Insufficient documentation

## 2008-08-23 DIAGNOSIS — K5909 Other constipation: Secondary | ICD-10-CM | POA: Insufficient documentation

## 2008-08-23 DIAGNOSIS — K649 Unspecified hemorrhoids: Secondary | ICD-10-CM | POA: Insufficient documentation

## 2008-08-23 DIAGNOSIS — Z853 Personal history of malignant neoplasm of breast: Secondary | ICD-10-CM | POA: Insufficient documentation

## 2008-08-23 DIAGNOSIS — Z85038 Personal history of other malignant neoplasm of large intestine: Secondary | ICD-10-CM | POA: Insufficient documentation

## 2008-08-24 LAB — CONVERTED CEMR LAB
ALT: 27 units/L (ref 0–35)
AST: 26 units/L (ref 0–37)
Albumin: 4.2 g/dL (ref 3.5–5.2)
Alkaline Phosphatase: 63 units/L (ref 39–117)
BUN: 11 mg/dL (ref 6–23)
Bacteria, UA: NEGATIVE
Basophils Absolute: 0.1 10*3/uL (ref 0.0–0.1)
Basophils Relative: 1.3 % (ref 0.0–3.0)
Bilirubin Urine: NEGATIVE
CEA: 2.4 ng/mL (ref 0.0–5.0)
CO2: 31 meq/L (ref 19–32)
Calcium: 9.8 mg/dL (ref 8.4–10.5)
Chloride: 103 meq/L (ref 96–112)
Creatinine, Ser: 0.8 mg/dL (ref 0.4–1.2)
Crystals: NEGATIVE
Eosinophils Absolute: 0.1 10*3/uL (ref 0.0–0.7)
Eosinophils Relative: 1.4 % (ref 0.0–5.0)
GFR calc Af Amer: 94 mL/min
GFR calc non Af Amer: 78 mL/min
Glucose, Bld: 101 mg/dL — ABNORMAL HIGH (ref 70–99)
HCT: 43.1 % (ref 36.0–46.0)
Hemoglobin, Urine: NEGATIVE
Hemoglobin: 14.3 g/dL (ref 12.0–15.0)
Ketones, ur: NEGATIVE mg/dL
Leukocytes, UA: NEGATIVE
Lymphocytes Relative: 27.7 % (ref 12.0–46.0)
MCHC: 33.2 g/dL (ref 30.0–36.0)
MCV: 88.6 fL (ref 78.0–100.0)
Monocytes Absolute: 0.5 10*3/uL (ref 0.1–1.0)
Monocytes Relative: 7.2 % (ref 3.0–12.0)
Mucus, UA: NEGATIVE
Neutro Abs: 4.1 10*3/uL (ref 1.4–7.7)
Neutrophils Relative %: 62.4 % (ref 43.0–77.0)
Nitrite: NEGATIVE
Platelets: 210 10*3/uL (ref 150–400)
Potassium: 4.8 meq/L (ref 3.5–5.1)
RBC / HPF: NONE SEEN
RBC: 4.86 M/uL (ref 3.87–5.11)
RDW: 12.3 % (ref 11.5–14.6)
Sodium: 141 meq/L (ref 135–145)
Specific Gravity, Urine: 1.01 (ref 1.000–1.03)
Total Bilirubin: 0.9 mg/dL (ref 0.3–1.2)
Total Protein, Urine: NEGATIVE mg/dL
Total Protein: 7.2 g/dL (ref 6.0–8.3)
Urine Glucose: NEGATIVE mg/dL
Urobilinogen, UA: 0.2 (ref 0.0–1.0)
WBC: 6.6 10*3/uL (ref 4.5–10.5)
pH: 8 (ref 5.0–8.0)

## 2008-08-30 ENCOUNTER — Ambulatory Visit: Payer: Self-pay | Admitting: Internal Medicine

## 2008-12-08 ENCOUNTER — Ambulatory Visit: Payer: Self-pay | Admitting: Hematology and Oncology

## 2008-12-12 LAB — COMPREHENSIVE METABOLIC PANEL
ALT: 23 U/L (ref 0–35)
AST: 19 U/L (ref 0–37)
Albumin: 4.1 g/dL (ref 3.5–5.2)
Alkaline Phosphatase: 75 U/L (ref 39–117)
BUN: 17 mg/dL (ref 6–23)
CO2: 27 mEq/L (ref 19–32)
Calcium: 9.8 mg/dL (ref 8.4–10.5)
Chloride: 101 mEq/L (ref 96–112)
Creatinine, Ser: 0.86 mg/dL (ref 0.40–1.20)
Glucose, Bld: 97 mg/dL (ref 70–99)
Potassium: 4.5 mEq/L (ref 3.5–5.3)
Sodium: 139 mEq/L (ref 135–145)
Total Bilirubin: 0.5 mg/dL (ref 0.3–1.2)
Total Protein: 6.7 g/dL (ref 6.0–8.3)

## 2008-12-12 LAB — CANCER ANTIGEN 27.29: CA 27.29: 39 U/mL (ref 0–39)

## 2008-12-12 LAB — CBC WITH DIFFERENTIAL/PLATELET
BASO%: 0.6 % (ref 0.0–2.0)
Basophils Absolute: 0.1 10*3/uL (ref 0.0–0.1)
EOS%: 1 % (ref 0.0–7.0)
Eosinophils Absolute: 0.1 10*3/uL (ref 0.0–0.5)
HCT: 42.2 % (ref 34.8–46.6)
HGB: 14 g/dL (ref 11.6–15.9)
LYMPH%: 23.2 % (ref 14.0–49.7)
MCH: 29.5 pg (ref 25.1–34.0)
MCHC: 33.2 g/dL (ref 31.5–36.0)
MCV: 88.7 fL (ref 79.5–101.0)
MONO#: 0.5 10*3/uL (ref 0.1–0.9)
MONO%: 5.1 % (ref 0.0–14.0)
NEUT#: 6.6 10*3/uL — ABNORMAL HIGH (ref 1.5–6.5)
NEUT%: 70.1 % (ref 38.4–76.8)
Platelets: 243 10*3/uL (ref 145–400)
RBC: 4.75 10*6/uL (ref 3.70–5.45)
RDW: 12.8 % (ref 11.2–14.5)
WBC: 9.4 10*3/uL (ref 3.9–10.3)
lymph#: 2.2 10*3/uL (ref 0.9–3.3)

## 2008-12-12 LAB — LACTATE DEHYDROGENASE: LDH: 158 U/L (ref 94–250)

## 2008-12-12 LAB — CEA: CEA: 1.7 ng/mL (ref 0.0–5.0)

## 2008-12-15 ENCOUNTER — Encounter: Payer: Self-pay | Admitting: Internal Medicine

## 2008-12-26 ENCOUNTER — Encounter: Payer: Self-pay | Admitting: Cardiology

## 2008-12-26 ENCOUNTER — Emergency Department (HOSPITAL_COMMUNITY): Admission: EM | Admit: 2008-12-26 | Discharge: 2008-12-26 | Payer: Self-pay | Admitting: Emergency Medicine

## 2009-01-20 ENCOUNTER — Ambulatory Visit: Payer: Self-pay | Admitting: Cardiology

## 2009-01-20 DIAGNOSIS — I10 Essential (primary) hypertension: Secondary | ICD-10-CM | POA: Insufficient documentation

## 2009-01-20 DIAGNOSIS — R072 Precordial pain: Secondary | ICD-10-CM | POA: Insufficient documentation

## 2009-02-06 ENCOUNTER — Ambulatory Visit: Payer: Self-pay

## 2009-02-06 ENCOUNTER — Encounter: Payer: Self-pay | Admitting: Cardiology

## 2009-02-09 ENCOUNTER — Encounter: Payer: Self-pay | Admitting: Cardiology

## 2009-02-10 ENCOUNTER — Telehealth: Payer: Self-pay | Admitting: Cardiology

## 2009-06-16 ENCOUNTER — Encounter: Admission: RE | Admit: 2009-06-16 | Discharge: 2009-06-16 | Payer: Self-pay | Admitting: Hematology and Oncology

## 2009-09-14 ENCOUNTER — Ambulatory Visit: Payer: Self-pay | Admitting: Hematology and Oncology

## 2009-09-18 ENCOUNTER — Ambulatory Visit (HOSPITAL_COMMUNITY): Admission: RE | Admit: 2009-09-18 | Discharge: 2009-09-18 | Payer: Self-pay | Admitting: Hematology and Oncology

## 2009-09-18 LAB — CBC WITH DIFFERENTIAL/PLATELET
BASO%: 0.7 % (ref 0.0–2.0)
Basophils Absolute: 0 10*3/uL (ref 0.0–0.1)
EOS%: 1.4 % (ref 0.0–7.0)
Eosinophils Absolute: 0.1 10*3/uL (ref 0.0–0.5)
HCT: 42.3 % (ref 34.8–46.6)
HGB: 14.1 g/dL (ref 11.6–15.9)
LYMPH%: 29.4 % (ref 14.0–49.7)
MCH: 29.8 pg (ref 25.1–34.0)
MCHC: 33.4 g/dL (ref 31.5–36.0)
MCV: 89.2 fL (ref 79.5–101.0)
MONO#: 0.3 10*3/uL (ref 0.1–0.9)
MONO%: 5 % (ref 0.0–14.0)
NEUT#: 4.2 10*3/uL (ref 1.5–6.5)
NEUT%: 63.5 % (ref 38.4–76.8)
Platelets: 215 10*3/uL (ref 145–400)
RBC: 4.75 10*6/uL (ref 3.70–5.45)
RDW: 13 % (ref 11.2–14.5)
WBC: 6.7 10*3/uL (ref 3.9–10.3)
lymph#: 2 10*3/uL (ref 0.9–3.3)

## 2009-09-18 LAB — COMPREHENSIVE METABOLIC PANEL
ALT: 28 U/L (ref 0–35)
AST: 28 U/L (ref 0–37)
Albumin: 4.2 g/dL (ref 3.5–5.2)
Alkaline Phosphatase: 61 U/L (ref 39–117)
BUN: 12 mg/dL (ref 6–23)
CO2: 28 mEq/L (ref 19–32)
Calcium: 9.6 mg/dL (ref 8.4–10.5)
Chloride: 105 mEq/L (ref 96–112)
Creatinine, Ser: 0.77 mg/dL (ref 0.40–1.20)
Glucose, Bld: 90 mg/dL (ref 70–99)
Potassium: 3.9 mEq/L (ref 3.5–5.3)
Sodium: 140 mEq/L (ref 135–145)
Total Bilirubin: 1.1 mg/dL (ref 0.3–1.2)
Total Protein: 6.9 g/dL (ref 6.0–8.3)

## 2009-09-18 LAB — CANCER ANTIGEN 27.29: CA 27.29: 37 U/mL (ref 0–39)

## 2009-09-18 LAB — LACTATE DEHYDROGENASE: LDH: 155 U/L (ref 94–250)

## 2009-09-18 LAB — CEA: CEA: 2.2 ng/mL (ref 0.0–5.0)

## 2009-09-21 ENCOUNTER — Encounter: Payer: Self-pay | Admitting: Internal Medicine

## 2009-10-19 ENCOUNTER — Encounter: Payer: Self-pay | Admitting: Internal Medicine

## 2010-06-07 ENCOUNTER — Ambulatory Visit: Payer: Self-pay | Admitting: Hematology and Oncology

## 2010-06-11 ENCOUNTER — Ambulatory Visit (HOSPITAL_COMMUNITY): Admission: RE | Admit: 2010-06-11 | Discharge: 2010-06-11 | Payer: Self-pay | Admitting: Hematology and Oncology

## 2010-06-11 LAB — CBC WITH DIFFERENTIAL/PLATELET
BASO%: 0.5 % (ref 0.0–2.0)
Basophils Absolute: 0 10*3/uL (ref 0.0–0.1)
EOS%: 0.7 % (ref 0.0–7.0)
Eosinophils Absolute: 0 10*3/uL (ref 0.0–0.5)
HCT: 38.6 % (ref 34.8–46.6)
HGB: 12.7 g/dL (ref 11.6–15.9)
LYMPH%: 25.9 % (ref 14.0–49.7)
MCH: 29.1 pg (ref 25.1–34.0)
MCHC: 33 g/dL (ref 31.5–36.0)
MCV: 88 fL (ref 79.5–101.0)
MONO#: 0.4 10*3/uL (ref 0.1–0.9)
MONO%: 6.2 % (ref 0.0–14.0)
NEUT#: 4.5 10*3/uL (ref 1.5–6.5)
NEUT%: 66.7 % (ref 38.4–76.8)
Platelets: 230 10*3/uL (ref 145–400)
RBC: 4.38 10*6/uL (ref 3.70–5.45)
RDW: 13.2 % (ref 11.2–14.5)
WBC: 6.7 10*3/uL (ref 3.9–10.3)
lymph#: 1.7 10*3/uL (ref 0.9–3.3)

## 2010-06-11 LAB — COMPREHENSIVE METABOLIC PANEL
ALT: 20 U/L (ref 0–35)
AST: 20 U/L (ref 0–37)
Albumin: 4.1 g/dL (ref 3.5–5.2)
Alkaline Phosphatase: 58 U/L (ref 39–117)
BUN: 13 mg/dL (ref 6–23)
CO2: 28 mEq/L (ref 19–32)
Calcium: 9.6 mg/dL (ref 8.4–10.5)
Chloride: 104 mEq/L (ref 96–112)
Creatinine, Ser: 0.73 mg/dL (ref 0.40–1.20)
Glucose, Bld: 104 mg/dL — ABNORMAL HIGH (ref 70–99)
Potassium: 3.9 mEq/L (ref 3.5–5.3)
Sodium: 141 mEq/L (ref 135–145)
Total Bilirubin: 0.4 mg/dL (ref 0.3–1.2)
Total Protein: 6.5 g/dL (ref 6.0–8.3)

## 2010-06-11 LAB — CANCER ANTIGEN 27.29: CA 27.29: 34 U/mL (ref 0–39)

## 2010-06-11 LAB — LACTATE DEHYDROGENASE: LDH: 187 U/L (ref 94–250)

## 2010-06-11 LAB — CEA: CEA: 2 ng/mL (ref 0.0–5.0)

## 2010-06-13 ENCOUNTER — Encounter: Payer: Self-pay | Admitting: Internal Medicine

## 2010-06-18 ENCOUNTER — Encounter: Admission: RE | Admit: 2010-06-18 | Discharge: 2010-06-18 | Payer: Self-pay | Admitting: Family Medicine

## 2010-08-18 ENCOUNTER — Encounter: Payer: Self-pay | Admitting: General Surgery

## 2010-08-19 ENCOUNTER — Encounter: Payer: Self-pay | Admitting: Internal Medicine

## 2010-08-19 ENCOUNTER — Encounter: Payer: Self-pay | Admitting: Hematology and Oncology

## 2010-08-20 ENCOUNTER — Encounter: Payer: Self-pay | Admitting: Hematology and Oncology

## 2010-08-30 NOTE — Letter (Signed)
Summary: [facility]  [facility]   Imported By: Sherian Rein 01/04/2009 10:48:43  _____________________________________________________________________  External Attachment:    Type:   Image     Comment:   External Document

## 2010-08-30 NOTE — Progress Notes (Signed)
Summary: stress  test results   Phone Note Call from Patient Call back at Home Phone [phone]   Caller: Patient Reason for Call: Lab or Test Results Summary of Call: stress test results Initial call taken by: Burnard Leigh,  February 10, 2009 3:48 PM  Follow-up for Phone Call         pt called, Stress echo results given to pt. Pt verbalized understanding Ollen Gross, RN, BSN  February 10, 2009 4:03 PM

## 2010-08-30 NOTE — Procedures (Signed)
Summary: Colonoscopy   Colonoscopy  Procedure date:  02/01/2005  Findings:      Location:  [facility].      Patient Name: Eileen [name], Eileen [name] MRN:  Procedure Procedures: Colonoscopy CPT: [phone].    with biopsy. CPT: [id number].  Personnel: Endoscopist: Mercades Bajaj L. [name], MD.  Exam Location: Exam performed in Outpatient Clinic. Outpatient  Patient Consent: Procedure, Alternatives, Risks and Benefits discussed, consent obtained, from patient. Consent was obtained by the RN.  Indications Symptoms: Abdominal pain / bloating.  Surveillance of: Adenomatous Polyp(s). Initial polypectomy was performed in 1996. 1-2 Polyps were found at Index Exam. Largest polyp removed was 6 to 9 mm. Prior polyp located in distal colon. Pathology of worst  polyp: high- grade dysplasia. Previous surveillance exam(s) in  1997, Previous surveillance exam(s) in  2002, 2002.  Increased Risk Screening: Personal history of breast cancer.  Comments: hx of bone marrow transplant 1999 History  Current Medications: Patient is not currently taking Coumadin.  Pre-Exam Physical: Performed Feb 01, 2005. Entire physical exam was normal.  Exam Exam: Extent of exam reached: Cecum, extent intended: Cecum.  The cecum was identified by appendiceal orifice and IC valve. Colon retroflexion performed. Images taken. ASA Classification: I. Tolerance: good.  Monitoring: Pulse and BP monitoring, Oximetry used. Supplemental O2 given.  Colon Prep Used Miralax for colon prep. Prep results: good.  Sedation Meds: Patient assessed and found to be appropriate for moderate (conscious) sedation. Fentanyl 50 mcg. given IV. Versed 5 mg. given IV.  Findings - TUMOR: Malignant tumor, suspected,  in Cecum. Length: 100 cm, circumferential, with partial obstruction. Biopsy/Tumor taken. 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  - NORMAL EXAM: Rectum.   Assessment Abnormal  examination, see findings above.  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   Comments: obstructing cecal mass, s/p biopsies Events  Unplanned Interventions: No intervention was required.  Unplanned Events: There were no complications. Plans Medication Plan: Await pathology.  Comments: refer to a surgeon, pt had a normal CT scan of the abdomen and pelvis  2 weeks ago Disposition: After procedure patient sent to recovery. After recovery patient sent home.      CC: Randall [name],MD     Lennis Levisay,MD  This report was created from the original endoscopy report, which was reviewed and signed by the above listed endoscopist.

## 2010-08-30 NOTE — Assessment & Plan Note (Signed)
Summary: ABD. PAIN X1 WEEK. HX. COLON CA.      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 SHE HAD SUBSEQUENT CHEMO. LAST COLONOSCOPY 8/08 WHICH SHOWED A NORMAL ANASTAMOSIS,SMALL INT HEM. 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 PT COMES IN TODAY WITH C/O RLQ ABDOMINAL PAIN ONSET ABOUT 10-12 DAYS AGO. SHE SAYS THIS WAS A HARD STEADY PAIN WITHOUT RADIATION AND WORSE WITH WALKING FOR ABOUT 4 DAYS. NO ASSOC. FEVER,NO N/V,+MILD BLOATING, NO CHANGE IN BOWEL HABITS,NO MELENA OR HEMATOCHEZIA. DENIES ANY URINARY SXS. SHE FELT BETTER FOR A COUPLE OF DAYS THEN THE PAIN RECURRED YESTERDAYAND HAS BEEN CONSTANT,NOT SEVERE. SHE APPARENTLY DID UNDERGO A UROLOGY EVAL THIS PAST FALL FOR MICROSCOPIC HEMATURIA AND THIS WAS NEGATIVE.  PT HAD ROUTINE CT ABD/PELVIS/CHEST IN 11/09 WHICH WERE NEGATIVE FOR ANY EVIDENCE OF METASTATIC DISEASE.   GI Review of Systems    Reports bloating.     Location of  Abdominal pain: RLQ.    Denies acid reflux, belching, chest pain, dysphagia with liquids, dysphagia with solids, heartburn, loss of appetite, nausea, vomiting, vomiting blood, and  weight loss.        Denies anal fissure, black tarry stools, change in bowel habit, constipation, diarrhea, diverticulosis, fecal incontinence, heme positive stool, hemorrhoids, irritable bowel syndrome, jaundice, light color stool, liver problems, rectal bleeding, and  rectal pain.     Updated Prior Medication List: * FEMARE 2.5 MG Take 1 daily VASOTEC 10 MG TABS (ENALAPRIL MALEATE) Take 1 tab daily in the am B-PLEX PLUS  TABS (MULTIPLE VITAMINS-MINERALS) Take 1 tab daily [facility] FOLIC ACID 400 MCG TABS (FOLIC ACID)   Current Allergies  (reviewed today): No known allergies   Past Medical History:    Reviewed history and no changes required:       Current Problems:        HEMORRHOIDS (ICD-455.6)       CONSTIPATION, CHRONIC (ICD-564.09)       Hx of RECTAL BLEEDING (ICD-569.3)       Hx of COLON CANCER (ICD-153.9) S/P RIGHT HEMICOLECTOMY2006/CHEMO       Hx of CARCINOMA, BREAST (ICD-174.9)/STEM CELL TRANSPLANT 1999         Past Surgical History:    Reviewed history and no changes required:       Bone Marrow Transplant in 1999       TAH/BSO in 1996          R Hemicolectomy 01/2005   Family History:    Reviewed history and no changes required:       Mother-Uterine Cancer       Father- MI  Social History:    Reviewed history and no changes required:       Occupation: Airline pilot       Patient is a former smoker.        Alcohol Use - yes- occassionally       Daily Caffeine Use- coffee daily       Illicit Drug Use - no       Patient gets regular exercise.-Regularly goes to gym 4-5 days a week   Risk Factors:  Tobacco use:  quit Drug use:  no Alcohol use:  yes Exercise:  yes   Review of Systems       The patient complains of arthritis/joint pain.  The patient denies anorexia, fever, weight loss, weight gain, vision loss, decreased hearing, hoarseness, chest pain, syncope, dyspnea on exertion, peripheral edema, prolonged cough, headaches, hemoptysis, melena, hematochezia, severe indigestion/heartburn, hematuria, incontinence, genital sores, muscle weakness, suspicious skin lesions, transient blindness, difficulty walking, depression, unusual weight change, abnormal bleeding, enlarged lymph nodes, angioedema, breast masses, and testicular masses.     Vital Signs:  Patient Profile:   65 Years Old Female Height:     65 inches Weight:      149.25 pounds BMI:     24.93 Pulse rate:   80 / minute Pulse rhythm:   regular BP sitting:   142 / 78  (right arm)  Vitals Entered By: Lowry Ram CMA (August 23, 2008  8:45 AM)                  Physical Exam  General:     Well developed, well nourished, no acute distress. Head:     Normocephalic and atraumatic. Eyes:     PERRLA, no icterus. Lungs:     Clear throughout to auscultation. Heart:     Regular rate and rhythm; no murmurs, rubs,  or bruits. Abdomen:     SOFT,MINIMALLY TENDER RLQ, NO PALP MASS OR HSM,NO GUARDING, NO REBOUND BS SOMEWHAT HYPERACTIVE Rectal:     HEMOCULT NEGATIVE Msk:     Symmetrical with no gross deformities. Normal posture. Neurologic:     Alert and  oriented x4;  grossly normal neurologically.    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 R/O LOW GRADE OBSTRUCTION,R/O URETEROLITHIASIS, R/O RECURRENT COLON LESION.  DISCUSSED REPEAT CT ABDOMEN WITH THE PT SHE IS VERY RELUCTANT AS SHE HAS HAD SO MANY SCANS IN THE PAST. PROCEED WITH COLONOSCOPY WITH DR BRODIE ON 08/30/08 CBC,CMET,CEA,UA TODAY. OFFERED PAIN MED ,PT DOES NOT FEEL SHE NEEDS AT THIS TIME.  Problem # 2:  PERSONAL HX COLON CANCER (ICD-V10.05) S/P RIGHT HEMICOLECTOMY 2006 FOR CECAL ADENOCA (STAGE 2A T3NOMO),LAST COLONOSCOPY 8/08  Other Orders: TLB-CMP (Comprehensive Metabolic Pnl) (80053-COMP) TLB-CBC Platelet - w/Differential (85025-CBCD) TLB-Udip w/ Micro (81001-URINE) TLB-CEA (Carcinoembryonic Antigen) (82378-CEA) Colonoscopy (Colon)     Prescriptions: DULCOLAX 5 MG  TBEC (BISACODYL) Day before procedure take 2 at 3pm and 2 at 8pm.  #4 x 0   Entered by:   Lowry Ram CMA   Authorized by:   Sammuel Cooper PA-c   Signed by:   Lowry Ram CMA on 08/23/2008   Method used:   Electronically to        [facility] [address] (retail)       [address]       [place], [place]  [address]       Ph: [phone] or [phone]       Fax: [phone]   RxID:    [id number] REGLAN 10 MG  TABS (METOCLOPRAMIDE HCL) As per prep instructions.  #2 x 0   Entered by:   Lowry Ram CMA   Authorized by:   Sammuel Cooper PA-c   Signed by:   Lowry Ram CMA on 08/23/2008   Method used:   Electronically to        [facility]  [address] (retail)       [address]       [place], [place]  [address]       Ph: [phone] or [phone]       Fax: [phone]   RxID:   [id number] MIRALAX   POWD (POLYETHYLENE GLYCOL 3350) As per prep  instructions.  #255gm x 0   Entered by:   Lowry Ram CMA   Authorized by:   Sammuel Cooper PA-c   Signed by:   Lowry Ram CMA on 08/23/2008   Method used:   Electronically to        [facility] (retail)       [address]       [place], [place]  [address]       Ph: [phone] or [phone]       Fax: [phone]   RxID:   [id number]

## 2010-08-30 NOTE — Progress Notes (Signed)
Summary: Abd.pain   Phone Note Call from Patient Call back at Work Phone [phone]   Caller: Patient Call For: Dr. [name] Reason for Call: Talk to Nurse Details for Reason: blood in stool Summary of Call: hx of colon cancer... blood in stool... pt only would leave work #, but said that she has no privacy at work to answer private questions Initial call taken by: Vallarie Mare,  August 22, 2008 1:09 PM  Follow-up for Phone Call        Pt. had low abd. pain last week. Restarted today. Denies constipation,diarrhea,blood,black stools,fever. Pt. would like an appointment to be checked out, she has a history of Colon Ca and is very concerned. Will see Amy on 08-23-08 at 8:30am.    Follow-up by: Laureen Ochs LPN,  August 22, 2008 1:44 PM

## 2010-08-30 NOTE — Procedures (Signed)
Summary: COLONOSCOPY   Colonoscopy  Procedure date:  02/27/2007  Findings:      Location:  [facility].      Patient Name: Eileen [name], Eileen [name] MRN:  Procedure Procedures: Colonoscopy CPT: [id number].  Personnel: Endoscopist: Zethan Alfieri L. [name], MD.  Exam Location: Exam performed in Endoscopy Suite.  Patient Consent: Procedure, Alternatives, Risks and Benefits discussed, consent obtained, from patient. Consent was obtained by the RN.  Indications  Surveillance of: Colorectal Cancer. The patient has had prior cancer staging. Index tumor removed in 2006, 1-2 tumors were found at the index exam. Previous surveillance exam/s in: Previous surveillance exam(s) in  2007,  Increased Risk Screening: Personal history of breast cancer.  Comments: s/p right hemicolectomy for cecal ca 2006 History  Current Medications: Patient is not currently taking Coumadin.  Pre-Exam Physical: Performed Feb 27, 2007. Entire physical exam was normal.  Comments: Pt. history reviewed/updated, physical exam performed prior to initiation of sedation?yes Exam Exam: Extent of exam reached: Ileum, extent intended: Ileum.  Colon retroflexion performed. Images taken. ASA Classification: II. Tolerance: good.  Monitoring: Pulse and BP monitoring, Oximetry used. Supplemental O2 given.  Colon Prep Used Miralax for colon prep. Prep results: good.  Sedation Meds: Patient assessed and found to be appropriate for moderate (conscious) sedation. Fentanyl 75 mcg. given IV. Versed given IV.  Findings - PRIOR SURGERY: Ileum. Right Hemicolectomy. Anastamosis present. Comments: normal functioning ileocolic anastomosis.  - HEMORRHOIDS: Internal. Size: Grade I. ICD9: Hemorrhoids, Internal: 455.0. Comments: small internal hems.   Assessment Abnormal examination, see findings above.  Diagnoses: 455.0: Hemorrhoids, Internal.   Comments: no evidence for recurrent cancer, s/p right hemicolectomy by Dr [name] in  2006 Events  Unplanned Interventions: No intervention was required.  Plans Patient Education: Patient given standard instructions for: Yearly hemoccult testing recommended. Patient instructed to get routine colonoscopy every 3 years.  Disposition: After procedure patient sent to recovery.    CC: Synthia Innocent.Ingram,MD     Lauretta Odogwu,MD  This report was created from the original endoscopy report, which was reviewed and signed by the above listed endoscopist.

## 2010-08-30 NOTE — Procedures (Signed)
Summary: Colonoscopy   Colonoscopy  Procedure date:  01/23/2006  Findings:      Location:  [facility].     Patient Name: Eileen [name], Eileen [name] MRN:  Procedure Procedures: Colonoscopy CPT: [phone].  Personnel: Endoscopist: Echo Propp L. [name], MD.  Exam Location: Exam performed in Outpatient Clinic. Outpatient  Patient Consent: Procedure, Alternatives, Risks and Benefits discussed, consent obtained, from patient. Consent was obtained by the RN.  Indications  Surveillance of: Colorectal Cancer. T-Score T3: Serosa/adventitia. N-Score N0: No nodes. M-Score M0: No distant metastasis. The patient has had prior cancer staging. This is not an initial surveillance exam. Index tumor removed in 2006, month of Jul. Index cancerous lesion was  located in proximal (splenic flexure and beyond) colon. 1-2 tumors were found at the index exam. Largest tumor removed was > 19 mm. Previous surveillance exam/s in: Previous surveillance exam(s) in  1996, Previous surveillance exam(s) in  1997, Previous surveillance exam(s) in  2004, 2006.  Increased Risk Screening: Personal history of breast cancer. s/p stem cell bone marrow transplant 1999.  History  Current Medications: Patient is not currently taking Coumadin.  Pre-Exam Physical: Performed Jan 23, 2006. Entire physical exam was normal.  Comments: Pt. history reviewed/updated, physical exam performed prior to initiation of sedation? Exam Exam: Extent of exam reached: Ileum, extent intended: Ileum.  Colon retroflexion performed. Images taken. ASA Classification: II. Tolerance: good.  Monitoring: Pulse and BP monitoring, Oximetry used. Supplemental O2 given.  Colon Prep Used Miralax for colon prep. Prep results: good.  Sedation Meds: Patient assessed and found to be appropriate for moderate (conscious) sedation. Fentanyl 75 mcg. given IV. Versed 9 mg. given IV.  Findings - PRIOR SURGERY: Cecum. Right Hemicolectomy. Anastamosis  present. Comments: wide open clean appearing anastomosis.  - HEMORRHOIDS: Internal. Size: Grade I. ICD9: Hemorrhoids, Internal: 455.0.   Assessment Abnormal examination, see findings above.  Diagnoses: 455.0: Hemorrhoids, Internal.   Comments: s/p right hemicolectomy for cecal carcinoma in 7/06, no evidence of recurrence Events  Unplanned Interventions: No intervention was required.  Unplanned Events: There were no complications. Plans Patient Education: Patient given standard instructions for: Yearly hemoccult testing recommended. Patient instructed to get routine colonoscopy every 1 years.  Disposition: After procedure patient sent to recovery. After recovery patient sent home.    CC: Brynda Greathouse [name],MD     Lennis Levisay,MD     Lauretta Odogwu,MD     [place].Ingram,MD  This report was created from the original endoscopy report, which was reviewed and signed by the above listed endoscopist.

## 2010-08-30 NOTE — Letter (Signed)
Summary: [facility]   Imported By: Lanelle Bal 07/12/2008 10:52:01  _____________________________________________________________________  External Attachment:    Type:   Image     Comment:   External Document

## 2010-08-30 NOTE — Assessment & Plan Note (Signed)
Summary: F2Y/KFW  Medications Added VITAMIN C 500 MG  TABS (ASCORBIC ACID) daily * CALICUM WITH VIT D daily * VITAMIN B 50 daily * VITAMIN A as needed      Allergies Added: NKDA  Visit Type:  Follow-up Primary Provider:  [name]  CC:  pt has had surgery on her feet since last visit here.  pt returns because when she took hydrocodone forher foot pain  she began to have sob, chest pain, and arm numbness.  Pt then went to hospital ER  she states they did not find anything but she should follow up with heart doctor. Since that no more eposides..  History of Present Illness: [name] is 65 years old and was referred by the [facility] long emergency department for evaluation of arm pain and diaphoresis. the patient had surgery on her foot on May 31 and then was given hydrocodone. She then developed arm pain and chest pain diaphoresis and dizziness and went to [facility] long emergency room. In the current and there showed PVCs but was otherwise normal. Her markers were negative. Her chest x-ray was negative. She was sent home with instructions to see [place] for further cardiac evaluation.  I had seen the patient in 2007 for evaluation of shortness of breath. We did a echocardiogram which was normal and a exercise rest rest Myoview scan which was normal. She does have a moderately high risk profile for vascular disease with history of hypertension and a father who had a microinfarction at age 50.  She has had no recurrent chest pain shortness of breath or palpitations since her ER visit on May 31.  She still works as an Electrical engineer. She is also a patient of Dr. Julio Alm.  Current Medications (verified): 1)  Femare 2.5 Mg .... Take 1 Daily 2)  Vasotec 10 Mg Tabs (Enalapril Maleate) .... Take 1 Tab Daily in The Am 3)  B-Plex Plus  Tabs (Multiple Vitamins-Minerals) .... Take 1 Tab Daily 4)  Cvs Folic Acid 400 Mcg Tabs (Folic Acid) 5)  Vitamin C 500 Mg  Tabs (Ascorbic Acid) .... Daily 6)   Calicum With Vit D .... Daily 7)  Vitamin B 50 .... Daily 8)  Vitamin A .... As Needed  Allergies (verified): No Known Drug Allergies  Past History:  Past Medical History: Reviewed history from 08/23/2008 and no changes required. Current Problems:  HEMORRHOIDS (ICD-455.6) CONSTIPATION, CHRONIC (ICD-564.09) Hx of RECTAL BLEEDING (ICD-569.3) Hx of COLON CANCER (ICD-153.9) S/P RIGHT HEMICOLECTOMY2006/CHEMO Hx of CARCINOMA, BREAST (ICD-174.9)/STEM CELL TRANSPLANT 1999  Family History: Reviewed history from 08/23/2008 and no changes required. Mother-Uterine Cancer Father- MI  Social History: Reviewed history from 08/23/2008 and no changes required. 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  Alcohol Use - yes- occassionally Daily Caffeine Use- coffee daily Illicit Drug Use - no Patient gets regular exercise.-Regularly goes to gym 4-5 days a week  Review of Systems       ROS is negative except as outlined in HPI.   Vital Signs:  Patient profile:   65 year old female Height:      65 inches Weight:      147 pounds Pulse rate:   68 / minute BP sitting:   124 / 75  (right arm) Cuff size:   regular  Vitals Entered By: Burnett Kanaris, CNA (January 20, 2009 10:09 AM)  Physical Exam  Additional Exam:  Gen. Well-nourished,  no distress   Neck: No JVD, thyroid not enlarged, no carotid  bruits Lungs: No tachypnea, clear without rales, rhonchi or wheezes Cardiovascular: Rhythm regular, PMI not displaced,  heart sounds  normal, no murmurs or gallops, no peripheral edema, pulses normal in all 4 extremities. Abdomen: BS nl, abdomen soft and non-tender without masses or organomegaly, no hepatosplenomegaly. Musculoskeletal: No deformities, no cyanosis or clubbing   Neuro:  No focal signs   Skin:  Warm, no lesions   Impression & Recommendations:  Problem # 1:  CHEST PAIN-PRECORDIAL (ICD-786.51)  The symptoms that brought her to [facility] long emergency room on the 31st are  somewhat atypical for ischemia but are somewhat concerning. She also has new PVCs and she has a moderate risk profile with a father who had an MI at age 8 and a history of hypertension in her. We plan to evaluate her further with a exercise rest rest Myoview scan. If this is negative I think we can reassure her that the symptoms were not indicative of serious cardiac condition. If it's abnormal then we'll decide about further evaluation.  Orders: EKG w/ Interpretation (93000) Nuclear Stress Test (Nuc Stress Test)  Problem # 2:  HYPERTENSION, BENIGN (ICD-401.1) She is currently taking Vasotec for blood pressure and this appears to be well-controlled.  Patient Instructions: 1)  exercise rest stress Myoview scan 2)  If this is negative we will have followup p.r.n. 3)  if it is positive then we'll decide about further evaluation

## 2010-08-30 NOTE — Progress Notes (Signed)
Summary: Cancel CT for now.   Phone Note Outgoing Call   Call placed by: Lowry Ram CMA,  August 23, 2008 11:24 AM Call placed to: Specialist Summary of Call: Called Rose @ [facility] and advised her the pt discussed with Jessina Marse her desire to not do the CT right now.  She has been through so much with her breast cancer and colon cancer.  Arael Piccione agreed to cancel the CT for now, have the pt do the colonoscoy on 08-30-2008.  We will discuss possibly doing the CT if it is warrented.  Initial call taken by: Lowry Ram CMA,  August 23, 2008 11:27 AM

## 2010-08-30 NOTE — Consult Note (Signed)
Summary: [facility]  [facility]   Imported By: Stephannie Li 01/13/2008 08:59:09  _____________________________________________________________________  External Attachment:    Type:   Image     Comment:   External Document

## 2010-08-30 NOTE — Letter (Signed)
Summary: [facility]  [facility]   Imported By: Lennie Odor 06/22/2010 12:20:42  _____________________________________________________________________  External Attachment:    Type:   Image     Comment:   External Document

## 2010-08-30 NOTE — Procedures (Signed)
Summary: Colonoscopy   Colonoscopy  Procedure date:  08/30/2008  Findings:      Location:  [facility].    Procedures Next Due Date:    Colonoscopy: 08/2011  COLONOSCOPY PROCEDURE REPORT  PATIENT:  Eileen [name], Eileen [name]  MR#:  [id number] BIRTHDATE:   Dec 19, 1945   GENDER:   female  ENDOSCOPIST:   [name]. [name], MD Referred by: Holley Bouche, M.D.  Arlan Organ, M.D.  PROCEDURE DATE:  08/30/2008 PROCEDURE:  Colonoscopy, diagnostic ASA CLASS:   Class II INDICATIONS: history of colon cancer s/p right hemicolectomy 2006, hx colonpolyp 1996, hx of breast cancer, s/p bone marrow transplant  last colonoscopy 8/08  pt had a recent episode of severe RLQ abd. pain  MEDICATIONS:    Versed 7 mg, Fentanyl 50 mcg  DESCRIPTION OF PROCEDURE:   After the risks benefits and alternatives of the procedure were thoroughly explained, informed consent was obtained.  Digital rectal exam was performed and revealed no rectal masses.   The LB PCF-[id number] endoscope was introduced through the anus and advanced to the ileum, without limitations.  The quality of the prep was good, using MiraLax.  The instrument was then slowly withdrawn as the colon was fully examined. <<PROCEDUREIMAGES>>    <<OLD IMAGES>>  FINDINGS:  The right colon was surgically resected and an ileo-colonic anastamosis was seen (see image1 and image2). wide open ileocolic anastomosis  normal rectum (see image3). small hemorrhoids   Retroflexed views in the rectum revealed no abnormalities.    The scope was then withdrawn from the patient and the procedure completed.  COMPLICATIONS:   None  ENDOSCOPIC IMPRESSION:  1) Prior right hemi-colectomy  2) Normal rectum  no evidence of recuirrent carcinoma RECOMMENDATIONS:  1) hemoccults q 2-3 years  2) high fiber diet  REPEAT EXAM:   In 3 year(s) for.   _______________________________ [name]. [name], MD  CC: Arlan Organ, M.D Holley Bouche, MD

## 2010-08-30 NOTE — Procedures (Signed)
Summary: Endoscopy   EGD  Procedure date:  02/01/2005  Findings:      Location: [facility]      Patient Name: Eileen [name], Eileen [name] MRN:  Procedure Procedures: Panendoscopy (EGD) CPT: 43235.    with biopsy(s)/brushing(s). CPT: D1846139.  Personnel: Endoscopist: Mariano Doshi L. [name], MD.  Exam Location: Exam performed in Outpatient Clinic. Outpatient  Patient Consent: Procedure, Alternatives, Risks and Benefits discussed, consent obtained, from patient. Consent was obtained by the RN.  Indications Symptoms: Reflux symptoms for <1 yr,  History  Current Medications: Patient is not currently taking Coumadin.  Pre-Exam Physical: Performed Feb 01, 2005  Entire physical exam was normal.  Exam Exam Info: Maximum depth of insertion Duodenum, intended Duodenum. Vocal cords visualized. Gastric retroflexion performed. Images taken. ASA Classification: I. Tolerance: good.  Sedation Meds: Patient assessed and found to be appropriate for moderate (conscious) sedation. Fentanyl 50 mcg. given IV. Versed 5 mg. given IV. Cetacaine Spray 2 sprays given aerosolized.  Monitoring: BP and pulse monitoring done. Oximetry used. Supplemental O2 given  Findings - Normal: Distal Esophagus.  DIAGNOSTIC TEST: from Antrum. RUT done, results pending   Assessment Normal examination.  Comments: s/p CLO test Events  Unplanned Intervention: No unplanned interventions were required.  Unplanned Events: There were no complications. Plans Medication(s): Await pathology. PPI: Esomeprazole/Nexium 40 mg QD, starting Feb 01, 2005   Comments: colonoscopy Disposition: After procedure patient sent to recovery. After recovery patient sent home.    CC: Eileen [name]  This report was created from the original endoscopy report, which was reviewed and signed by the above listed endoscopist.

## 2010-08-30 NOTE — Letter (Signed)
Summary: [facility]   Imported By: Sherian Rein 11/23/2009 13:08:45  _____________________________________________________________________  External Attachment:    Type:   Image     Comment:   External Document

## 2010-08-31 NOTE — Letter (Signed)
[facility]   Imported By: Lester [place] 10/13/2009 07:44:07  _____________________________________________________________________  External Attachment:    Type:   Image     Comment:   External Document

## 2010-11-06 LAB — CBC
HCT: 36.3 % (ref 36.0–46.0)
Hemoglobin: 11.9 g/dL — ABNORMAL LOW (ref 12.0–15.0)
MCHC: 32.8 g/dL (ref 30.0–36.0)
MCV: 88.9 fL (ref 78.0–100.0)
Platelets: 177 10*3/uL (ref 150–400)
RBC: 4.08 MIL/uL (ref 3.87–5.11)
RDW: 12.9 % (ref 11.5–15.5)
WBC: 9.4 10*3/uL (ref 4.0–10.5)

## 2010-11-06 LAB — URINALYSIS, ROUTINE W REFLEX MICROSCOPIC
Bilirubin Urine: NEGATIVE
Glucose, UA: NEGATIVE mg/dL
Hgb urine dipstick: NEGATIVE
Ketones, ur: NEGATIVE mg/dL
Nitrite: NEGATIVE
Protein, ur: NEGATIVE mg/dL
Specific Gravity, Urine: 1.004 — ABNORMAL LOW (ref 1.005–1.030)
Urobilinogen, UA: 0.2 mg/dL (ref 0.0–1.0)
pH: 6.5 (ref 5.0–8.0)

## 2010-11-06 LAB — POCT I-STAT, CHEM 8
BUN: 15 mg/dL (ref 6–23)
Calcium, Ion: 1.2 mmol/L (ref 1.12–1.32)
Chloride: 99 mEq/L (ref 96–112)
Creatinine, Ser: 1 mg/dL (ref 0.4–1.2)
Glucose, Bld: 240 mg/dL — ABNORMAL HIGH (ref 70–99)
HCT: 39 % (ref 36.0–46.0)
Hemoglobin: 13.3 g/dL (ref 12.0–15.0)
Potassium: 3.8 mEq/L (ref 3.5–5.1)
Sodium: 137 mEq/L (ref 135–145)
TCO2: 27 mmol/L (ref 0–100)

## 2010-11-06 LAB — DIFFERENTIAL
Basophils Absolute: 0 10*3/uL (ref 0.0–0.1)
Basophils Relative: 0 % (ref 0–1)
Eosinophils Absolute: 0.1 10*3/uL (ref 0.0–0.7)
Eosinophils Relative: 1 % (ref 0–5)
Lymphocytes Relative: 12 % (ref 12–46)
Lymphs Abs: 1.1 10*3/uL (ref 0.7–4.0)
Monocytes Absolute: 0.6 10*3/uL (ref 0.1–1.0)
Monocytes Relative: 6 % (ref 3–12)
Neutro Abs: 7.7 10*3/uL (ref 1.7–7.7)
Neutrophils Relative %: 81 % — ABNORMAL HIGH (ref 43–77)

## 2010-11-06 LAB — POCT CARDIAC MARKERS
CKMB, poc: <1 ng/mL — ABNORMAL LOW (ref 1.0–8.0)
CKMB, poc: <1 ng/mL — ABNORMAL LOW (ref 1.0–8.0)
Myoglobin, poc: 69.2 ng/mL (ref 12–200)
Myoglobin, poc: 97.8 ng/mL (ref 12–200)
Troponin i, poc: <0.05 ng/mL (ref 0.00–0.09)
Troponin i, poc: <0.05 ng/mL (ref 0.00–0.09)

## 2010-11-23 ENCOUNTER — Encounter: Payer: Self-pay | Admitting: Cardiology

## 2010-11-29 ENCOUNTER — Encounter: Payer: Self-pay | Admitting: Cardiology

## 2010-12-04 ENCOUNTER — Encounter (INDEPENDENT_AMBULATORY_CARE_PROVIDER_SITE_OTHER): Payer: Self-pay | Admitting: General Surgery

## 2010-12-04 ENCOUNTER — Encounter: Payer: Self-pay | Admitting: Cardiology

## 2010-12-04 ENCOUNTER — Ambulatory Visit (INDEPENDENT_AMBULATORY_CARE_PROVIDER_SITE_OTHER): Payer: Managed Care, Other (non HMO) | Admitting: Cardiology

## 2010-12-04 DIAGNOSIS — R079 Chest pain, unspecified: Secondary | ICD-10-CM

## 2010-12-04 NOTE — Assessment & Plan Note (Signed)
Continue present blood pressure medications. Followup with primary care.

## 2010-12-04 NOTE — Patient Instructions (Addendum)
Your physician recommends that you schedule a follow-up appointment in: AS NEEDED  Your physician recommends that you continue on your current medications as directed. Please refer to the Current Medication list given to you today.  Your physician has requested that you have a stress echocardiogram. For further information please visit [email or web address] Please follow instruction sheet as given. DX CHEST PAIN  AT PT'S CONVENINCE./CY

## 2010-12-04 NOTE — Assessment & Plan Note (Signed)
Symptoms atypical. Schedule stress echo for risk stratification.

## 2010-12-04 NOTE — Progress Notes (Signed)
HPI: 65 year old female previously seen by Dr. [name] for dyspnea and chest pain. Seen in 2007 for evaluation of shortness of breath. Echocardiogram was normal and a exercise Myoview scan was normal. Stress echocardiogram in July of 2010 was normal. Patient states that over the past several weeks she has had occasional chest pain. It is in the left chest area without radiation. There are no associated symptoms. The pain is not pleuritic, positional or exertional. It lasts 30 minutes and resolved spontaneously. She denies dyspnea on exertion, orthopnea, pedal edema or syncope. Note she exercises routinely and does not have chest pain.  Current Outpatient Prescriptions  Medication Sig Dispense Refill  . CALCIUM-VITAMIN D PO Take by mouth daily.        . folic acid (FOLVITE) 400 MCG tablet Take 400 mcg by mouth daily.        Marland Kitchen letrozole (FEMARA) 2.5 MG tablet Take 2.5 mg by mouth daily.        Marland Kitchen losartan (COZAAR) 25 MG tablet Take 25 mg by mouth daily.        Marland Kitchen DISCONTD: Ascorbic Acid (VITAMIN C) 500 MG tablet Take 500 mg by mouth daily.        Marland Kitchen DISCONTD: enalapril (VASOTEC) 10 MG tablet Take 10 mg by mouth daily.        Marland Kitchen DISCONTD: Multiple Vitamins-Minerals (B-PLEX PLUS) TABS Take by mouth daily.        Marland Kitchen DISCONTD: NON FORMULARY femare 2.5mg  qd        . DISCONTD: Thiamine HCl (VITAMIN B-1) 50 MG tablet Take 50 mg by mouth daily.        Marland Kitchen DISCONTD: vitamin A 84132 UNIT capsule Take 10,000 Units by mouth as needed.           Past Medical History  Diagnosis Date  . CARCINOMA, BREAST   . COLON CANCER   . HEMORRHOIDS   . Hypertension     Past Surgical History  Procedure Date  . Bone marrow transplant 1999  . Total abdominal hysterectomy w/ bilateral salpingoophorectomy 1996  . Hemicolectomy 01/2005    right    History   Social History  . Marital Status: Married    Spouse Name: [name]    Number of Children: [name]  . Years of Education: [name]   Occupational History  . Not on file.    Social History Main Topics  . Smoking status: Former Games developer  . Smokeless tobacco: Not on file  . Alcohol Use: Not on file  . Drug Use: Not on file  . Sexually Active: Not on file   Other Topics Concern  . Not on file   Social History Narrative  . No narrative on file    ROS: no fevers or chills, productive cough, hemoptysis, dysphasia, odynophagia, melena, hematochezia, dysuria, hematuria, rash, seizure activity, orthopnea, PND, pedal edema, claudication. Remaining systems are negative.  Physical Exam: Well-developed well-nourished in no acute distress.  Skin is warm and dry.  HEENT is normal.  Neck is supple. No thyromegaly.  Chest is clear to auscultation with normal expansion.  Cardiovascular exam is regular rate and rhythm.  Abdominal exam nontender or distended. No masses palpated. Extremities show no edema. neuro grossly intact  ECG Sinus rhythm at a rate of 72. Occasional PAC and PVC. No significant ST changes.

## 2010-12-11 NOTE — Op Note (Signed)
NAMEANNALI, Eileen [name]               ACCOUNT NO.:  [id number]   MEDICAL RECORD NO.:  [id number]          PATIENT TYPE:  OIB   LOCATION:  0006                         FACILITY[facility]   PHYSICIAN:  Marlowe Kays, M.D.  DATE OF BIRTH:  [birth year]   DATE OF PROCEDURE:  10/14/2007  DATE OF DISCHARGE:                               OPERATIVE REPORT   PREOPERATIVE DIAGNOSES:  1. 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  2. Painful corns outer aspect of the great toe and fourth toe right      foot.   POSTOPERATIVE DIAGNOSES:  1. 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  2. Painful corns outer aspect of the great toe and fourth toe right      foot.   OPERATION:  1. Funk bunionectomy.  2. Partial condylectomy outer distal proximal phalanx great toe.  3. Partial condylectomy proximal and middle phalanges fourth toe right      foot.   SURGEON:  Marlowe Kays, M.D.   ASSISTANTDruscilla Brownie. Cherlynn June.   ANESTHESIA:  General.   JUSTIFICATION FOR PROCEDURE:  She has painful right foot with the  deformities and pathology noted under diagnoses.  With standing x-ray  she had roughly a 15 degree second metatarsal angle and on top of that  the above mentioned bunionectomy with first metatarsal osteotomy was the  appropriate treatment for the problem.   PROCEDURE IN DETAIL:  Prophylactic antibiotics.  She has a history of  bone marrow transplant.  Satisfactory general anesthesia.  Pneumatic  tourniquet with right leg Esmarch'd out nonsterilely and prepped from  mid calf to toes with DuraPrep and draped in a sterile field.  Time-out  performed.  First addressed the fourth toe problem.  She had a soft corn  over the mid outer portion due to abutment against the little toe.  I  curetted out gently the corn then made a dorsal lateral incision  exposing the protruding distal condyle of the proximal phalanx and the  outer  base of the middle phalanx.  With this protruding areas of bone  were resected with a small rongeurs.  The wound was irrigated with  sterile saline and closure performed with interrupted 4-0 Vicryl and  subcutaneous tissue and interrupted 5-0 nylon mattress sutures in the  skin.  I then made a dorsal lateral incision centered over the soft corn  of the great toe which I also curetted out.  The rather prominent  protruding portion of the distal condyle of the proximal phalanx.  I  then isolated with sharp dissection and resected this as well with a  small rongeurs.  I then irrigated the wound well and closed this wound  as with the fourth toe.  Lastly I addressed the bunion problem.  I made  a mid medial incision from roughly the proximal portion of the proximal  phalanx into the distal metatarsal area.  The incision was carried down  carefully through the subcutaneous tissue.  She had a large amount of  reactive  inflammatory tissue over the bunion which I carefully dissected  off trying to preserve the dorsal cutaneous nerve which was retracted  lateral ward.  The capsule was then demarcated and I opened it with a  flat base distally exposing the MP joint of the great toe which was in  relatively good shape.  With osteotomes and rongeurs I then performed a  generous bunionectomy including removal of some dorsal bunion as well.  I then measured along the cut surface from the bunionectomy a centimeter  from the articular surface and made a scribe line there with cautery and  then another 6 mm proximal to it with a second scribe line.  At the more  proximal line I then used a micro saw to make a transverse cut leaving  the lateral cortex intact.  Distally I then made oblique cut removing  the wedge of bone and perforating the lateral cortex with a small  osteotome and then gently cracking down the defect which realigned the  great toe nicely.  After irrigating the wound well with saline I  then  held the great toe in the corrected position and with proximal tension  reapproximated the capsule with multiple interrupted zero Vicryl.  Skin  and subcutaneous tissue were closed with interrupted 4-0 nylon and 5-0  nylon.  Betadine, Adaptic dry sterile dressing were applied with a well-  padded sterile tongue blade along the medial border of the foot and  great toe.  I followed this with medial and lateral plaster splints and  continued dry sterile dressing.  The tourniquet was released.  She  tolerated the procedure well and was taken to the recovery room in  satisfactory condition.  There were no known complications.           ______________________________  Marlowe Kays, M.D.     JA/MEDQ  D:  10/14/2007  T:  10/14/2007  Job:  [id number]

## 2010-12-11 NOTE — Op Note (Signed)
Eileen [name], Eileen [name]               ACCOUNT NO.:  [id number]   MEDICAL RECORD NO.:  [id number]          PATIENT TYPE:  AMB   LOCATION:  DAY                          [facility]ACILITY:  [facility]   PHYSICIAN:  Marlowe Kays, M.D.  DATE OF BIRTH:  Jul 07, 1946   DATE OF PROCEDURE:  05/27/2008  DATE OF DISCHARGE:                               OPERATIVE REPORT   PREOPERATIVE DIAGNOSIS:  Metatarsus primus varus bunion and hallux  valgus deformities, left foot.   POSTOPERATIVE DIAGNOSIS:  Metatarsus primus varus bunion and hallux  valgus deformities, left foot.   OPERATION:  FUNK bunionectomy, left foot.   SURGEON:  [place]   ASSISTANT:  Nurse.   ANESTHESIA:  General.   PATHOLOGY AND JUSTIFICATION FOR PROCEDURE:  She has a 15-degree 1st and  2nd metatarsal angle bilaterally.  I performed a similar procedure on  her right foot, and she is happy with it, and she is here today for  correction of the left foot.   PROCEDURE:  Satisfactory general anesthesia, pneumatic tourniquet.  Left  leg was esmarched out nonsterilely and tourniquet inflated to 325 mmHg.  Left leg was prepped from midcalf to toes with DuraPrep and draped in a  sterile field.  Time-out performed.  I made a dorsal medial incision  extending from midportion of the proximal phalanx to the great toe, down  over the distal 1st metatarsal.  Incision was carried down to the  underlying capsule.  Dorsal sensory nerve was identified and protected.  I opened the capsule with the flap based distally.  A large prominent  bunion was identified and after making a cut at the base of the bunion,  I then went from distal to proximal with a small osteotome and removed  the major portion of the bunion which I trimmed up slightly with a small  rongeur afterwards.  I then measured from the distal articular surface 1  cm proximal-ward and made a scribe line on the cut bone with a marking  pen.  I then measured additional 6 mm proximal to this and  made a 2nd  scribe line.  At the more proximal line, I then used a microsaw and  protecting the underlying tissues, made a transverse cut to the bone  leaving the lateral cortex intact.  Then distally I made oblique cut,  removing a wedge of bone and leaving the lateral cortex once again  intact.  I then closed the osteotomy.  It was a little wobbly but did  appear to be stable.  I then trimmed a little bit more of the metatarsal  head off to give a nice smooth medial line to the resection.  The great  toe valgus was corrected.  The wound was irrigated well with sterile  saline and soft tissues infiltrated with 0.5% plain Marcaine.  With the  great toe in a correct position, I then re-placed the capsular flap  tightly and proximally with interrupted 0 Vicryl.  The skin and  subcutaneous tissue were then closed with interrupted 4-0 nylon mattress  sutures.  Betadine, Adaptic, dry sterile dressing and a well-padded sterile  tongue  blade were applied to the great toe and the metatarsal.  Tourniquet was  released.  She tolerated the procedure well and at the time of this  dictation, was on her way to recovery in satisfactory condition with no  known complications.           ______________________________  Marlowe Kays, M.D.     JA/MEDQ  D:  05/27/2008  T:  05/27/2008  Job:  [id number]

## 2010-12-13 ENCOUNTER — Ambulatory Visit (HOSPITAL_COMMUNITY): Payer: Managed Care, Other (non HMO) | Attending: Cardiology | Admitting: Radiology

## 2010-12-13 ENCOUNTER — Ambulatory Visit (HOSPITAL_BASED_OUTPATIENT_CLINIC_OR_DEPARTMENT_OTHER): Payer: Managed Care, Other (non HMO) | Admitting: Radiology

## 2010-12-13 DIAGNOSIS — R0989 Other specified symptoms and signs involving the circulatory and respiratory systems: Secondary | ICD-10-CM | POA: Insufficient documentation

## 2010-12-13 DIAGNOSIS — R072 Precordial pain: Secondary | ICD-10-CM

## 2010-12-13 DIAGNOSIS — R0609 Other forms of dyspnea: Secondary | ICD-10-CM | POA: Insufficient documentation

## 2010-12-13 DIAGNOSIS — R5383 Other fatigue: Secondary | ICD-10-CM | POA: Insufficient documentation

## 2010-12-13 DIAGNOSIS — I10 Essential (primary) hypertension: Secondary | ICD-10-CM | POA: Insufficient documentation

## 2010-12-13 DIAGNOSIS — R5381 Other malaise: Secondary | ICD-10-CM | POA: Insufficient documentation

## 2010-12-14 NOTE — H&P (Signed)
NAMEENAYA, HOWZE NO.:  [id number]   MEDICAL RECORD NO.:  [id number]                   PATIENT TYPE:  EMS   LOCATION:  ED                                   FACILITY:  [facility]   PHYSICIAN:  Sherin Quarry, MD                   DATE OF BIRTH:  [birth year]   DATE OF ADMISSION:  03/30/2003  DATE OF DISCHARGE:                                HISTORY & PHYSICAL   HISTORY OF PRESENT ILLNESS:  Eileen [name] is a pleasant, very physically  active, 65 year old lady who reports that about 3 p.m. she took a break from  work and went for a short walk.  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  This  seemed to last for about one hour.  Then this resolved and she noted the  onset of numbness of the left side of her face and the left hand without any  obvious motor impairment.  These symptoms gradually resolved and then she  noted the onset of numbness in her left foot.  Her husband felt that during  the course of this she seemed to have some slurring of the speech.  At about  6 p.m. all the symptoms seemed to have entirely resolved, but they felt it  would be prudent to be further evaluated and presented to the [facility]  Emergency Room.  There was no associated headache, dysphagia, shortness of  breath, chest pain, nausea, or vomiting.  There was no past history of  hyperlipidemia, diabetes, or heart disease.  She does not smoke.  Of note is  that she does have a past history perhaps 20 years ago of recurrent migraine  headaches, which would sometimes be heralded by visual symptoms.   PAST MEDICAL HISTORY:   ALLERGIES:  None.   CURRENT MEDICATIONS:  1. Femara one tablet daily.  2. Fosamax 70 mg weekly.   ILLNESSES:  1. The patient was diagnosed with breast cancer in 1995 and had a recurrence     in 1998.  She received a stem cell transplant at [facility] on 1999 and has had     no evidence of  recurrence.  2. She had an MRI scan of the brain done in February of 2004, apparently     because of headaches and concern that there might be metastatic disease.     This was normal.  She also has bone scans and CT scans at [facility] every six     months as part of her breast cancer follow-up.   OPERATIONS:  1. The patient has had bilateral inguinal hernias.  2. She has also had a left mastectomy as noted above.   FAMILY HISTORY:  The patient's sister had history of hypertension.  Her  father died of an MI at age 28.  Her mother died of  cancer at the age of 65.   SOCIAL HISTORY:  The patient lives with her husband.  She does not smoke or  abuse alcohol or drugs.  She exercises frequently by walking and jogging at  least four times per week and does not experience any difficulty doing this.  She works in an Oceanographer.   REVIEW OF SYSTEMS:  Otherwise negative.   PHYSICAL EXAMINATION:  GENERAL:  She is an alert, pleasant, very intelligent  lady who has no complaints.  VITAL SIGNS:  Her blood pressure is 146/78, pulse is 72, respirations 12.  HEENT:  Within normal limits.  NECK:  Her carotids are 2+ without bruits.  CHEST:  Clear to auscultation and percussion.  BACK:  Examination of the back revealed no CVA or point tenderness.  BREASTS:  Remarkable for previous left mastectomy.  CARDIOVASCULAR:  Revealed normal S1 and S2 without rubs, murmurs, or  gallops.  ABDOMEN:  Within normal limits.  There were no masses or tenderness.  NEUROLOGIC:  Cranial nerves, motor, sensory, and cerebellar testing was  normal.  Station and gait was normal.  EXTREMITIES:  Revealed no evidence of cyanosis or edema.   LABORATORY DATA:  An EKG was within normal limits.  A CT scan of the brain,  verbal report was negative.   White count was 5800, hemoglobin 13.1.  A CMET was within normal limits.  PT  and PTT were normal.   IMPRESSION:  1. Possible transient ischemic attack versus migraine  equivalent.  2. History of breast cancer, status post recurrence, status post stem cell     transplant.  3. Osteoporosis.  4. History of migraine headaches.  5. History of inguinal hernia.   PLAN:  I think it would be important to make sure that the possibility of a  transient ischemic attack is thoroughly evaluated.  Will obtain a carotid  ultrasound.  Will schedule MRI and MRA scan.  Will do a 2-D echo.  Will  check a lipid profile and I am going to empirically add aspirin and Plavix  pending the results of these studies.                                                  Sherin Quarry, MD    SY/MEDQ  D:  03/30/2003  T:  03/30/2003  Job:  [id number]   cc:   Holley Bouche, M.D.  [address]  [place], [place] [address]  Fax: [phone]

## 2010-12-14 NOTE — Op Note (Signed)
Eileen [name], Eileen [name]                           ACCOUNT NO.:  [id number]   MEDICAL RECORD NO.:  [id number]                   PATIENT TYPE:  AMB   LOCATION:  DSC                                  FACILITY:  [facility]   PHYSICIAN:  [name]. [name], M.D.             DATE OF BIRTH:  1946/02/26   DATE OF PROCEDURE:  11/28/2003  DATE OF DISCHARGE:                                 OPERATIVE REPORT   PREOPERATIVE DIAGNOSIS:  Left neck mass.   POSTOPERATIVE DIAGNOSIS:  Left neck mass.   OPERATION PERFORMED:  Excision, 8 mm left neck mass.   SURGEON:  [name]. [name], M.D.   OPERATIVE INDICATION:  This is a 64 year old [place] woman who underwent  surgery for a breast cancer of the left breast about 10 years ago.  She had  positive lymph nodes.  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  She has had no known recurrence since that time.  She has been known to have  small lymph nodes in the left neck for some time.  She thinks they may be  getting a little bit larger.  Because they are on the same side as her prior  recurrence, she is becoming more concerned and she was advised to have these  excised and came to me for that.   OPERATIVE TECHNIQUE:  The patient was placed supine on the operating table  with her neck turned to the right.  I could palpate in the left neck about  an 8 mm superficial mobile mass in the posterior triangle of the mid-neck.  This area was prepped and draped in a sterile fashion.  Xylocaine 1% with  epinephrine was used as a local infiltration anesthetic.  An oblique  incision approximately 3 cm in length was made in Langer's lines.  Dissection was carried down through the subcutaneous tissue and through the  platysma muscle, and then we encountered what appeared to be a soft, benign-  feeling, rubbery mass consistent with a  benign lymph node and consistent  with the palpable abnormality.  This was dissected away using electrocautery  and sent for routine histology.  This was sent fresh to the lab with the  appropriate history attached.  Hemostasis was excellent and achieved with  electrocautery.  The platysma muscle was closed with interrupted sutures of  3-0 Vicryl.  The skin was closed with a running subcuticular suture of 4-0  Monocryl and Steri-Strips.  Clean bandages were placed and the patient taken  to the recovery room in stable condition.  Estimated blood loss was about 3  mL.  Complications:  None.  The sponge, needle, and instrument counts were  correct.  [name]. [name], M.D.    HMI/MEDQ  D:  11/28/2003  T:  11/28/2003  Job:  [id number]   cc:   Maia Breslow, M.D.  Dept. of Medical Oncology  [facility]  [place], [place]   Pierce Crane, M.D.  [address] [facility]  [place]  [place] [address]  Fax: [phone]   Andres Ege, M.D.  [address]  [place]  [place] [address]  Fax: [phone]

## 2010-12-14 NOTE — Discharge Summary (Signed)
Eileen [name], Eileen [name]                           ACCOUNT NO.:  [id number]   MEDICAL RECORD NO.:  [id number]                   PATIENT TYPE:  INP   LOCATION:  5705                                 FACILITY:  [facility]   PHYSICIAN:  Deirdre Peer. Polite, M.D.              DATE OF BIRTH:  [birth year]   DATE OF ADMISSION:  03/31/2003  DATE OF DISCHARGE:  04/01/2003                                 DISCHARGE SUMMARY   PRIMARY CARE PHYSICIAN:  Holley Bouche, M.D., [facility]   DISCHARGE DIAGNOSES:  1. Transient ischemic attack characterized by left-sided numbness and     slurred speech:  Resolved.  2. Recurrent breast cancer.  3. Osteoporosis.  4. Hypertension:  Requires outpatient followup.   DISCHARGE MEDICATIONS:  1. Fosamax 70 mg a week.  2. Aspirin 325 mg a day.   ALLERGIES:  No known drug allergies.   PROCEDURES:  None.   HISTORY OF PRESENT ILLNESS:  This is a 65 year old female who presented with  complaint of bilateral blurred vision, left-side facial and left hand  weakness of sudden onset without motor impairment.  Her husband noted that  her speech seemed to be somewhat slurred.  The patient had had symptoms for  several hours prior to presentation.  In the emergency department, she is  admitted to rule out CVA.   HOSPITAL COURSE:  The patient underwent carotid Doppler studies which showed  mild heterogenous plaque noted in the distal CCA and origin on the right  side with mild focal heterogenous plaque noted on the anterior wall of the  origin and proximal ICA on the right side and the left side had no  significant plaque noted.  There was no significant stenosis on either side  and vertebral artery flow antegrade.  A 2-D echocardiogram was performed  which found normal LV function with an EF of 50%-55%.  No left ventricular  regional wall motion abnormality.  No echocardiographic evidence of cardiac  source of embolism.  EKG was also normal.   The patient's  symptoms subsided and her neuromuscular status is now at  baseline.  MRI showed mild white matter disease with nonspecific changes and  no acute stroke.  The MRA was negative.  Therefore, the patient is  discharged with diagnosis of TIA.  The patient was noted to be hypertensive  throughout her stay in the hospitalization.  Blood pressure initially was  146/78 and day #2 blood pressure was 161/81.  At discharge, blood pressure  is 133/81.  Recommend outpatient followup regarding potential new diagnosis  of hypertension.   At the time of discharge, the patient is free of any neurological deficits  or complaints of headache, slurred speech, blurred vision, chest pain, or  shortness of breath.  No numbness or tingling.  Her vital signs show a  temperature of 97.7, blood pressure 133/81, pulse 63, respirations are 16.   DISCHARGE LABORATORY DATA:  Total cholesterol 184, triglycerides 74, HDL 67,  LDL 102.  Sodium 143, potassium 3.9, glucose 80, BUN 10, creatinine 0.8, ALP  41, AST 21, ALT 27, total bilirubin 0.9.  White blood cell count 5.4,  hemoglobin 12.7 hematocrit 38.8, platelet count 211,000.  PTT 28, PT 11.9,  INR 0.8.   CONSULTATIONS:  None.   CONDITION AT DISCHARGE:  Good.   DISPOSITION:  Discharged to home.  Followup is scheduled with Dr. Tiburcio Pea on  April 07, 2003 at 11:30 a.m.      Ellender Hose. Virl Son. Polite, M.D.    SMD/MEDQ  D:  04/01/2003  T:  04/02/2003  Job:  [id number]   cc:   Holley Bouche, M.D.  [address]  [place], [place] [address]  Fax: [phone]

## 2010-12-14 NOTE — Discharge Summary (Signed)
Eileen [name], Eileen [name]               ACCOUNT NO.:  [id number]   MEDICAL RECORD NO.:  [id number]          PATIENT TYPE:  INP   LOCATION:  1604                         FACILITY:  [facility]   PHYSICIAN:  [name]. [name], M.D.DATE OF BIRTH:  1945/10/07   DATE OF ADMISSION:  02/01/2005  DATE OF DISCHARGE:  02/08/2005                                 DISCHARGE SUMMARY   FINAL DIAGNOSES:  1.  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  History of metastatic breast cancer, in remission since stem cell      transplant in 1999.  3.  Status post bilateral inguinal hernia repair.  4.  Status post hysterectomy and bilateral salpingo-oophorectomy.   OPERATIONS PERFORMED:  1.  Colonoscopy with biopsy, date February 01, 2005.  2.  Right colectomy, date February 03, 2005.   HISTORY:  This is a 65 year old white female well-known to me from  management of breast cancer in the past. She had been in remission since  stem cell transplant in 1999.   For 3 weeks prior to admission she noted some crampy centralized and right  lower quadrant abdominal pain but did not see any blood in her stools. A CT  scan was performed as an outpatient and no abnormalities were noted. She  apparently had one hemoccult positive stool. Dr. [name] saw her and  performed an elective colonoscopy in the day of admission and she described  a malignant-appearing neoplasm the cecum, circumferential, with partial  obstruction. She was admitted following the colonoscopy and I was asked to  see her at that point. She otherwise has been feeling fine; denies pain,  nausea, vomiting, abdominal distension, or weight loss. She requested that  we do something about her colon tumor as soon as possible.   PHYSICAL EXAMINATION:  GENERAL:  Pleasant middle-aged eastern-European woman  in no distress.  VITAL SIGNS:  Weight 149, height 5 feet 5 inches, temperature 97.9.  NECK:  Supple and nontender. Small  scar on the left neck well-healed, no  adenopathy or thyromegaly, no jugular distension.  LUNGS:  Clear to auscultation. No chest wall tenderness.  ABDOMEN:  Flat, soft, nontender, except perhaps a little bit of subjective  tenderness in the right suprapubic area. No mass palpable. No distension, no  hernia. Liver and spleen not enlarged. No inguinal adenopathy.   HOSPITAL COURSE:  The patient completed a bowel prep and was taken to the  operating room on February 03, 2005 at which time she underwent a right  colectomy. She had a palpable mass in the right colon but no palpable  adenopathy or signs of any metastatic disease. Surgery was uneventful.   Final pathology report showed that this was an adenocarcinoma stage T3 N0  with positive lymphovascular invasion. The patient was seen by Dr. Arlan Organ from the medical oncology group and plans for outpatient follow-up  were decided upon.   She advanced in her diet and activity slowly but steadily over the next few  days until she was ready to go home on  February 08, 2005. At that time she was  tolerating a regular diet, had had bowel movements. Her abdominal exam was  fairly benign with a clean wound and no distension or tenderness. Follow-up  arrangements were made to see me in 2 weeks. Follow-up arrangements were  made to see Dr. Arlan Organ on February 26, 2005. Her staples were removed  from her skin and Steri-Strips were applied. She was given a prescription  for Vicodin for pain.       HMI/MEDQ  D:  03/04/2005  T:  03/04/2005  Job:  [id number]   cc:   [name], M.D. [facility]  [address]  [place]  [place] [address]   Holley Bouche, M.D.  [address]  [place], [place] [address]  Fax: [phone]   Lauretta I. Odogwu, M.D.  Fax: [phone]   Andres Ege, M.D.  [address]  [place]  [place] [address]  Fax: [phone]   Jarvis Newcomer, M.D.  [facility]  Department of  Medical Oncology

## 2010-12-14 NOTE — H&P (Signed)
Eileen [name], Eileen [name]               ACCOUNT NO.:  [id number]   MEDICAL RECORD NO.:  [id number]          PATIENT TYPE:  INP   LOCATION:  1604                         [facility]ACILITY:  [facility]   PHYSICIAN:  [name], M.D. [facility]  DATE OF BIRTH:  1946/07/25   DATE OF ADMISSION:  02/01/2005  DATE OF DISCHARGE:                                HISTORY & PHYSICAL   REASON FOR ADMISSION:  Obstructing cecal mass found on colonoscopy.  Mass  consistent with carcinoma.   HISTORY OF PRESENT ILLNESS:  [name] is a very pleasant, 65 year old woman  of [place] origin.  She had breast cancer diagnosed and surgically excised  in 1995.  She had a recurrence in 1998, which required a stem cell  transplant performed at [facility].  Generally her oncology followup has been at  [facility] since then.   The patient had a colonoscopy in 1996, showing a dysplastic polyp in the  left colon.  The colonoscopy was repeated in 1997, at which time no polyps  were encountered.  The cecal region was normal.  Another colonoscopy in 2002  was also normal to the cecum.   For about 2-3 weeks she has been having crampy abdominal pain which she  noted started after being treated with clindamycin for 1 week.  She did not  have any diarrhea.  In fact, she was constipated, bloated, distended, and  appetite was off.  Food exacerbated the symptoms.  She was set up for a  colonoscopy, and this was performed July 7th at the [facility].  During the procedure, Dr. [name] encountered an obstructing mass in  the cecum which looked cancerous.  She admitted the patient afterwards for  surgical evaluation and supportive care with IV fluids and symptom  management.   MEDICATIONS:  1.  Fosamax 70 mg p.o. weekly.  2.  Femara 2.5 mg daily.  3.  Previously did take Nexium but no longer requires this medication.   DRUG ALLERGIES:  No known drug allergies.   PAST MEDICAL HISTORY:  1.  Left partial mastectomy in 1995.  Had stage T1, N1  at the time, treated      with radiation and tamoxifen.  Developed left supraclavicular metastasis      in 1998, after which she underwent bone marrow stem cell transplant.  2.  Biopsy of left axillary mass July of 1999.  This was benign and      noncancerous.  Dr. Chaya Jan at the [facility] mostly      follows her of late.  Dr. Donnie Coffin also has followed her in the past.  3.  On January 16, 2005, the patient had a CT scan of the abdomen and pelvis at      [facility], and this showed no evidence for metastatic      disease or inflammatory process.  4.  Osteoporosis.  5.  Status post hysterectomy and bilateral salpingo-oophorectomy.  6.  Status post bilateral inguinal surgery 30 years ago.   SOCIAL HISTORY:  The patient is married.  She and her husband live in  [place].  They have two adult children.  She works in the Product manager at [employer].  She does not use alcohol or tobacco.   FAMILY HISTORY:  MI as the cause of death in the father.  The mother died  with uterine cancer.  Maternal grandfather and an aunt also had colon  cancer.  She has a sister who is alive and well.   REVIEW OF SYSTEMS:  Other than GI symptoms as described above, it is pretty  much a negative review of systems.  She has no problems with nosebleed or  sinus congestion.  No sweats or chills.  No polydipsia or polyuria.  No  problems with inappropriate bleeding or bruising.  Does get occasional  headaches.  No history of falls or extremity weakness.  No cough, no  shortness of breath.  She is up to date on flu and pneumococcal vaccine  performed in the fall of 2005.  She has occasional insomnia.  No urinary  incontinence.   PHYSICAL EXAMINATION:  GENERAL APPEARANCE:  The patient is a pleasant white  female who is nonobese.  VITAL SIGNS:  Blood pressure 154/83, pulse 64, respirations 19, temperature  97.5.  Weight is 149 pounds.  HEENT:  Sclerae are nonicteric.  Conjunctivae are  pink.  Extraocular  movements are intact.  Oropharynx mucosa is moist and clear.  NECK:  No JVD, no masses.  CHEST:  Clear to auscultation and percussion bilaterally.  COR:  Regular rate and rhythm.  No murmurs, rubs or gallops.  ABDOMEN:  Diffusely tender, but this is especially so in the right lower  quadrant and somewhat in the left upper quadrant.  There is no rebound or  guarding.  Bowel sounds are active.  RECTAL:  Exam was performed on the 3rd of July, and it was guaiac negative.  She had a colonoscopy on the day of admission which encountered the cecal  mass.  NEUROLOGIC:  She is alert and oriented times three.  No tremor.  No  extremity weakness.  PSYCHIATRIC:  The patient is pleasant.  She does not appear depressed.  EXTREMITIES:  No cyanosis, clubbing or edema.  BREASTS/GU:  Exams were not performed.   IMPRESSION:  1.  Obstructing cecal mass, consistent with colon carcinoma.  Will need      surgical excision.  2.  A history of breast cancer with resection, chemotherapy and radiation in      1995.  Status post bone marrow transplant for recurrence to the neck      region in 1999.   PLAN:  Admit the patient to regular floor bed for surgical evaluation and  supportive care.   DIET:  Will be limited to clear liquids.   Dr. [name] will be available to see the patient on July 8th.  He is the  surgeon who has performed prior surgeries.  We plan to get an acute  abdominal series to rule out absolute versus partial obstruction.  Plan to  continue her oral medications,  Femara and Nexium.       SG/MEDQ  D:  02/03/2005  T:  02/03/2005  Job:  [id number]   cc:   [name]. [name], M.D.  [address]  [place]  [place] [address]   Pierce Crane, M.D.  [address] [facility]  [place]  [place] [address]  Fax: [phone]   Holley Bouche, M.D.  [address]  [place], [place] [address] Fax: [phone]

## 2010-12-14 NOTE — Consult Note (Signed)
NAMEKEYLIE, BEAVERS               ACCOUNT NO.:  [id number]   MEDICAL RECORD NO.:  [id number]          PATIENT TYPE:  INP   LOCATION:  1604                         FACILITY:  [facility]   PHYSICIAN:  [name]. [name], M.D.DATE OF BIRTH:  27-Aug-1945   DATE OF CONSULTATION:  02/02/2005  DATE OF DISCHARGE:                                   CONSULTATION   REASON FOR CONSULTATION:  Evaluate colon mass.   HISTORY OF PRESENT ILLNESS:  This is a 65 year old white female, who is  known to me from management of breast cancer in the past.  She has been  healthy and stable over the past few years.  For the past 3 weeks, she has  noted some crampy centralized and right lower quadrant abdominal pain, but  she denies seeing blood in her stools and denies any change in her bowel  movements.  She denies nausea or vomiting, fever, or weight loss.   She was evaluated by Dr. Elias Else.  A CT scan was performed at  [facility] on January 16, 2005, and no abnormalities were noted.  She was evaluated by Dr. [name] as an outpatient.  Apparently, she had  one stool that was hemoccult positive, although on rectal exam there were no  masses; stool was hemoccult negative.  A colonoscopy was performed  yesterday.  Dr. [name] describes a malignant-appearing neoplasm in the  cecum, circumferential with partial obstruction.  Biopsies were taken.  This  is pending.  The patient was admitted for further evaluation and management.   This morning, she feels fine.  She denies any pain, nausea, vomiting, or  abdominal distention.  She is aware of her problem and wants to proceed with  appropriate surgical intervention as soon as possible.   PAST MEDICAL HISTORY:  1.  Left partial mastectomy in 1995.  Recurrence in the left supraclavicular      area in 1998.  Stem cell bone marrow transplant in 1999 in remission      since then.  2.  A hysterectomy and BSO in 1997.  3.  Bilateral inguinal hernia surgery  30 years ago.  4.  She had left neck lymph node biopsy in 2005 which was benign.   CURRENT MEDICATIONS:  1.  Fosamax 70 mg weekly.  2.  Femora daily.  3.  She was on Nexium, but that has been discontinued.   DRUG ALLERGIES:  None known.   SOCIAL HISTORY:  The patient is married.  They live in [place].  They have  two children.  She works in the Oceanographer at [employer].  Denies the use of alcohol or tobacco.   FAMILY HISTORY:  Father died of myocardial infarction.  Mother died of  uterine cancer.  A maternal grandfather and an aunt both had colon cancer.  Sister living and well.   REVIEW OF SYSTEMS:  All systems are reviewed.  They are completely  noncontributory except as described above.   PHYSICAL EXAMINATION:  GENERAL:  Very pleasant, middle-aged, [place]  European woman in no distress.  She is fit and happy and pleasant.  VITAL SIGNS:  Weight is 149.  Estimated height is 5 feet 5 inches.  Temp  97.9, blood pressure 154/87, heart rate 64 and regular, respiratory rate 19.  EYES:  Sclerae clear.  Extraocular movements intact.  EARS/NOSE/MOUTH/THROAT:  Nose looks clean and oropharynx without lesions.  NECK:  Supple and nontender.  Small scar on the left neck, well healed.  There is no adenopathy or thyromegaly.  There is no jugular venous  distention.  LUNGS:  Clear to auscultation.  No chest wall tenderness.  HEART:  Regular rate and rhythm.  No murmur.  BREASTS:  Not examined.  ABDOMEN:  Flat, soft, nontender except perhaps a little bit of subjective  tenderness in the right suprapubic area.  I do not feel a mass.  There is no  distention.  There is no hernia.  The liver and spleen are not enlarged.  EXTREMITIES:  She moves all four extremities well without pain or deformity.  NEUROLOGIC:  No gross motor or sensory deficits.   ADMISSION DATA:  CT scan and colonoscopy reports as described above.  CEA  level is pending.  Hemoglobin 11.6, white blood cell  count 5200, potassium  3.3, liver function tests normal, urinalysis negative.   ASSESSMENT:  1.  Malignant neoplasm of the cecum, circumferential.  I suspect she has had      obstructive symptoms.  This is much more likely to be primary colon      cancer than metastatic breast cancer, especially considering the      negative CT scan and the family history of colon cancer.  2.  History of metastatic breast cancer, in remission since stem cell      transplant in 1999.  3.  Status post bilateral inguinal hernias.  4.  Status post hysterectomy and BSO.   PLAN:  1.  We will complete her bowel prep today.  2.  We will proceed with right colectomy tomorrow, February 03, 2005.  3.  I have discussed the indications in detail for this procedure with her.      Risks and complications have been outlined, including but not limited to      bleeding, infection, injury to adjacent organs with major reconstructive      surgery, wound problems such as infection or hernia, cardiac, pulmonary,      and thromboembolic problems.  She seems to understand these issues well.      At this time, all of her questions are answered.  She is in full      agreement with this plan.       HMI/MEDQ  D:  02/02/2005  T:  02/02/2005  Job:  [id number]   cc:   [name], M.D. [facility]   Holley Bouche, M.D.  [address]  [place], [place] [address]  Fax: [phone]   Andres Ege, M.D.  [address]  [place]  [place] [address]  Fax: [phone]   Maia Breslow, MD  [facility], Dept. of Medical Oncology

## 2010-12-14 NOTE — Consult Note (Signed)
Eileen [name], Eileen [name]               ACCOUNT NO.:  [id number]   MEDICAL RECORD NO.:  [id number]          PATIENT TYPE:  INP   LOCATION:  1604                         [facility]ACILITY[facility]  [facility]   PHYSICIAN:  Lauretta I. Odogwu, M.D.DATE OF BIRTH:  1945/10/19   DATE OF CONSULTATION:  DATE OF DISCHARGE:                                   CONSULTATION   REASON FOR CONSULTATION:  Cecal mass.   IDENTIFYING STATEMENT:  The patient is a 65 year old woman, being seen at  the request of Dr. [name] with a cecal mass.   HISTORY OF PRESENT ILLNESS:  The patient presented to gastroenterology with  a 3-week history of right lower quadrant pain without melena, hematochezia,  weight loss or changes in bowel habits.  Abdominal CT scan done in the  office on January 16, 2005 was negative for pathology.  Colonoscopy performed  January 31, 2005 demonstrated a new obstructing fecal mass.  Biopsies were  obtained.  CA was 1.6.  The patient was subsequently admitted to evaluate  and manage impending obstruction.  This morning she underwent a right  colectomy performed by Dr. Claud Kelp.  Final pathology is pending.  Besides being in low pain, she is in very good spirits.   PAST MEDICAL HISTORY:  1.  Breast cancer, initially diagnosed in December, 1995.  She underwent a      left lumpectomy for a 1.4-cm infiltrating ductal carcinoma followed by      CMF and tamoxifen.  She had a recurrence in the left supraclavicular      lymph node (ER negaive/PR positive) and went on to receive four cycles      of doxorubicin and Taxotere followed by a hematopoietic stem cell      transplant on August 16, 1997.  She has had no evidence of recurrence      since then, and she is currently on letrozole.  2.  Status post total abdominal hysterectomy and BSO in 1997.  3.  Bilateral hernia repair.   ALLERGIES:  No known drug allergies.   MEDICATIONS:  1.  Lovenox.  2.  Femara 2.5 mg daily.  3.  Protonix 40 mg daily.  4.   Fluoxetine.  5.  Fentanyl.   SOCIAL HISTORY:  The patient is married and works in Audiological scientist.  She lives  in [place] and denies alcohol or tobacco use.   FAMILY HISTORY:  Pertinent in that the patient's maternal grandfather and  maternal aunt had colon cancer.  Her mother died of uterine cancer.   PHYSICAL EXAMINATION:  GENERAL APPEARANCE:  The patient is immediate postop,  slightly drowsy but able to answer questions.  VITAL SIGNS:  Pulse 61, blood pressure 124/65, temperature 99, respirations  20.  Saturations 99%.  HEENT:  Head is atraumatic and normocephalic.  Sclerae are anicteric.  Extraocular muscles are intact.  Mouth was moist without thrush.  CHEST:  Clear to auscultation.  CARDIOVASCULAR:  Exam reveals sinus rhythm.  ABDOMEN:  Revealed a wound dressing.  The patient is recently postop.  EXTREMITIES:  Revealed no edema.   IMPRESSION:  1.  Recently status  post right colectomy for near obstructing cecal mass.  2.  A history of recurrent breast cancer, status post hematopoietic stem      cell transplant in January, 1999 and currently on letrozole.   PLAN:  Will await final pathology report before treatment options are  recommended.  However, the patient will need to recover from surgery prior  to oncology follow-up visit and treatment.  The patient follows up at [facility]  with breast oncology and has indicated that she may probably follow up there  for a GI cancer, as she would like to keep all her oncology care within one  institution.  She is still very undecided.  She was, however, given my  contact number should she change her mind or require referral to GI Oncology  at [facility] or if she should have any questions.   Thank you for this referral.       LIO/MEDQ  D:  02/03/2005  T:  02/03/2005  Job:  [id number]   cc:   [name], M.D. [facility]

## 2010-12-14 NOTE — Assessment & Plan Note (Signed)
[facility]                         GASTROENTEROLOGY OFFICE NOTE   NAME:Marquess, Maricia                        MRN:          [id number]  DATE:07/21/2006                            DOB:          25-Apr-1946    Ms. Tilda Burrow is a 65 year old white female with history of breast CA in 1996  status post bone marrow transplant in 1999, history of colon CA in the  cecum July 2006 status post right hemicolectomy.  She is status post  TAH/BSO in 1996.  Last colonoscopy in June 2007 did not show evidence of  polyps or lesions.  She comes now with three episodes of rectal  bleeding.  Two of them occurred 3 weeks ago, one about 3 days ago.  On  her last colonoscopy in June 2007 she was found to have first-grade  internal hemorrhoids.  The bleeding is described as bright red blood in  the water as well as on the toilet tissue.  There is no associated  abdominal pain.  The patient has had chronic constipation, poor  evacuation.  She has been on high-fiber diet with not much improvement  of the constipation.   MEDICATIONS:  1. Anusol HC suppositories q.h.s. x12.  2. Femara.  3. Enalapril.  4. Calcium supplements.  5. Folic acid.  6. Vitamin E, B, and C.   PHYSICAL EXAMINATION:  VITAL SIGNS:  Blood pressure 120/70, pulse 65,  weight 150 pounds.  GENERAL:  She was alert, oriented, no distress.  LUNGS:  Clear to auscultation.  CARDIAC:  Normal S1, S2.  ABDOMEN:  Soft, nontender, with normal active bowel sounds, mild  discomfort to right lower quadrant, no palpable mass.  Liver edge at  edge costal margin.  RECTAL AND ANOSCOPIC:  Shows normal perianal area.  Rectal tone was  normal.  Small skin tag protruding through the anal canal.  In the  rectal ampulla, first-grade hemorrhoids circumferentially, at least  three of them, without prolapse.  They are bluish as well as reddish.  No active bleeding but hyperemic showing signs of potential bleeding.  Stool itself was  hemoccult negative.   IMPRESSION:  A 65 year old white female with recent history of right  hemicolectomy with adenocarcinoma of the cecum with recurrent rectal  bleeding which at this time is due to first-grade hemorrhoids.   PLAN:  1. Anusol HC suppositories to use q.h.s. for 1 week, then p.r.n.  2. High-fiber diet.  3. MiraLAX 17 g two to three times a week.  4. Next colonoscopy June 2008.  At that time we may consider doing      banding of the hemorrhoids if the patient desires to do so; we have      discussed it today.     [name]. [name], MD  Electronically Signed    DMB/MedQ  DD: 07/21/2006  DT: 07/21/2006  Job #: [id number]   cc:   [name], M.D.  Lauretta I. Odogwu, M.D.  [name]. [name], M.D.

## 2010-12-14 NOTE — Op Note (Signed)
Eileen [name], [name]               ACCOUNT NO.:  [id number]   MEDICAL RECORD NO.:  [id number]          PATIENT TYPE:  INP   LOCATION:  1604                         FACILITY:  [facility]   PHYSICIAN:  [name]. [name], M.D.DATE OF BIRTH:  1946/01/30   DATE OF PROCEDURE:  02/03/2005  DATE OF DISCHARGE:                                 OPERATIVE REPORT   PREOPERATIVE DIAGNOSIS:  Neoplastic mass of the ileocecal area.   POSTOPERATIVE DIAGNOSIS:  Neoplastic mass of the ileocecal area.   OPERATION PERFORMED:  Right colectomy.   SURGEON:  [name]. [name], M.D.   OPERATIVE INDICATIONS:  This is a 65 year old white female with a past  history of breast cancer which recurred in the supraclavicular area. She has  undergone stem cell transplant and chemotherapy and has been in remission  with no evidence of disease for about 7 years. She has been healthy. She  recently developed some right-sided abdominal pain. A CT scan is  unremarkable but a colonoscopy shows a circumferential mass at the ileocecal  area partially obstructing the ileocecal valve. She was admitted to hospital  48 hours ago, completed her bowel prep yesterday which she tolerated fairly  well and was brought to operating room today for right colectomy.   FINDINGS:  The patient had a mass which seemed to be encircling the  ileocecal valve. The terminal ileum appeared to be invaginated into the wall  of the cecum. The tumor did not appear to penetrate the full-thickness of  the wall so far as I could see. There were some slightly enlarged lymph  nodes in the mesentery which were taken with the specimen. There was no  ascites. There was no signs of any metastatic disease. Specifically the  liver felt normal. Both right and left lobes. The omentum felt normal. There  is no retroperitoneal mass or other mesenteric mass other than some enlarged  lymph node along the right colic artery. The pelvis felt fine. There is no  mass in the  pelvis. The small bowel looked healthy and there is no evidence  of any obstruction, the small bowel was completely collapsed.   OPERATIVE TECHNIQUE:  Following induction of general endotracheal  anesthesia, the patient was given intravenous antibiotics. She was  identified. The abdomen was prepped and draped in sterile fashion after the  insertion of Foley catheter. A transverse incision was made in the right  abdomen just above the level of the umbilicus. Subcutaneous tissues were  divided with electrocautery. The rectus sheath and rectus muscle was divided  with electrocautery and the abdomen entered under direct vision. The abdomen  was explored with findings as described above. We mobilized the terminal  ileum and right colon and hepatic flexure by dividing the lateral peritoneal  attachments with electrocautery. Larger attachments that appeared to have  some small vessels in them were clamped, divided and ligated with 2-0 silk  ties. We continued this mobilization until the terminal ileum, right colon  and hepatic flexure were completely mobilized and were up to the midline and  out in the wound. The duodenum was identified and  preserved.  I identified  the right and left branches of the middle colic artery and divided the  transverse colon at this point using the GIA stapling device. Mesenteric  vessels were then isolated, clamped, divided and ligated with 2-0 silk ties.  The right colic and ileocolic vessels were isolated, clamped, divided and  ligated 2-0 silk ties. The larger of these were doubly ligated with 2-0  silk. The terminal ileum was transected about 6 inches proximal to the  ileocecal valve with the GIA stapling device and specimen was removed and  sent to lab. An anastomosis was created between the terminal ileum and the  mid transverse colon with a GIA stapling device. The defect in the bowel  wall was closed with TA 60 stapling device. This seemed to work well. At   this point we changed our gloves and instruments. The mesentery was closed  with multiple interrupted figure-of-eight sutures of 3-0 silk. Hemostasis  was excellent. The anastomotic staple line was reinforced at strategic  points with some interrupted sutures of 3-0 silk. We irrigated the abdomen  and pelvis with about 2 or 3 liters of saline and all the irrigation fluid  became clear. There did not appear to be any bleeding in the right pericolic  gutter or the mesentery and hemostasis appeared quite good. The viscera were  returned to their anatomic position. The posterior rectus sheath was closed  with running suture of #1 PDS. The midline fascia was closed with some  interrupted sutures of #1 PDS. The anterior rectus sheath was closed with  running suture of #1 PDS. Wounds were irrigated with saline. Skin closed  with skin staples. Clean bandages were placed. The patient taken to recovery  room in stable condition. Estimated blood loss was about 50-75 mL.  Complications none. Sponge, needle, and instrument counts were correct.       HMI/MEDQ  D:  02/03/2005  T:  02/04/2005  Job:  [id number]   cc:   [name], M.D. [facility]   Holley Bouche, M.D.  [address]  [place], [place] [address]  Fax: [phone]   Andres Ege, M.D.  [address]  [place]  [place] [address]  Fax: [phone]   [facility] Dept. Medical Oncology

## 2010-12-17 ENCOUNTER — Encounter: Payer: Managed Care, Other (non HMO) | Admitting: Genetic Counselor

## 2010-12-20 ENCOUNTER — Ambulatory Visit (INDEPENDENT_AMBULATORY_CARE_PROVIDER_SITE_OTHER): Payer: Managed Care, Other (non HMO) | Admitting: Cardiology

## 2010-12-20 ENCOUNTER — Encounter: Payer: Self-pay | Admitting: Cardiology

## 2010-12-20 VITALS — BP 156/82 | HR 75 | Resp 17 | Ht 65.0 in | Wt 146.4 lb

## 2010-12-20 DIAGNOSIS — R9430 Abnormal result of cardiovascular function study, unspecified: Secondary | ICD-10-CM

## 2010-12-20 DIAGNOSIS — I10 Essential (primary) hypertension: Secondary | ICD-10-CM

## 2010-12-20 DIAGNOSIS — R072 Precordial pain: Secondary | ICD-10-CM

## 2010-12-20 NOTE — Assessment & Plan Note (Signed)
No further symptoms. No ischemia on stress echocardiogram.

## 2010-12-20 NOTE — Assessment & Plan Note (Signed)
I have reviewed the patient's stress echocardiogram with the patient and her husband. The report states her LV function is diminished at baseline but improves in all territories with exercise to normal. I reviewed this today and it is difficult to quantify the patient's LV function at rest as there was some ectopy. It appears to be normal to mildly reduced. Her LV function is vigorous with exercise and there are were no wall motion abnormalities. I will plan to quantify LV function with a MUGA. If her LV function is reduced we will evaluate for potential causes of nonischemic cardiomyopathy. If her LV function is normal no further workup planned.

## 2010-12-20 NOTE — Progress Notes (Signed)
HPI: 65 year old female previously seen by Dr. [name] for dyspnea and chest pain. Seen in 2007 for evaluation of shortness of breath. Echocardiogram was normal and a exercise Myoview scan was normal. Stress echocardiogram in July of 2010 was normal. When I saw her previously, we scheduled a repeat stress echo for chest pain. Performed 5/12 and revealed per Dr. Myrtis Ser moderately reduced LV function at rest with an ejection fraction of 35% but improvement in all segments with exercise to an ejection fraction of 55%. No chest pain and no ST changes. Since she was seen previously, she denies dyspnea on exertion, orthopnea, PND, pedal edema, palpitations, syncope or chest pain.   Current Outpatient Prescriptions  Medication Sig Dispense Refill  . CALCIUM-VITAMIN D PO Take by mouth daily.        . folic acid (FOLVITE) 400 MCG tablet Take 400 mcg by mouth as needed.       Marland Kitchen letrozole (FEMARA) 2.5 MG tablet Take 2.5 mg by mouth daily.        Marland Kitchen losartan (COZAAR) 25 MG tablet Take 25 mg by mouth daily.           Past Medical History  Diagnosis Date  . CARCINOMA, BREAST   . COLON CANCER   . HEMORRHOIDS   . Hypertension     Past Surgical History  Procedure Date  . Bone marrow transplant 1999  . Total abdominal hysterectomy w/ bilateral salpingoophorectomy 1996  . Hemicolectomy 01/2005    right    History   Social History  . Marital Status: Married    Spouse Name: [name]    Number of Children: [name]  . Years of Education: [name]   Occupational History  . Not on file.   Social History Main Topics  . Smoking status: Former Smoker    Quit date: 12/20/1990  . Smokeless tobacco: Not on file  . Alcohol Use: No  . Drug Use: Not on file  . Sexually Active: Not on file   Other Topics Concern  . Not on file   Social History Narrative  . No narrative on file    ROS: no fevers or chills, productive cough, hemoptysis, dysphasia, odynophagia, melena, hematochezia, dysuria, hematuria, rash, seizure  activity, orthopnea, PND, pedal edema, claudication. Remaining systems are negative.  Physical Exam: Well-developed well-nourished in no acute distress.  Skin is warm and dry.  HEENT is normal.  Neck is supple. No thyromegaly.  Chest is clear to auscultation with normal expansion.  Cardiovascular exam is regular rate and rhythm.  Abdominal exam nontender or distended. No masses palpated. Extremities show no edema. neuro grossly intact

## 2010-12-20 NOTE — Patient Instructions (Signed)
You are being scheduled for a MUGA study.  Please follow the instruction sheet given. Please continue your current medications Follow up with Dr Jens Som as needed

## 2010-12-20 NOTE — Assessment & Plan Note (Signed)
Blood pressure mildly elevated but she states normal at home. Continue present medications.

## 2010-12-25 ENCOUNTER — Ambulatory Visit (HOSPITAL_COMMUNITY): Payer: Managed Care, Other (non HMO) | Attending: Cardiovascular Disease | Admitting: Radiology

## 2010-12-25 DIAGNOSIS — Z853 Personal history of malignant neoplasm of breast: Secondary | ICD-10-CM | POA: Insufficient documentation

## 2010-12-25 DIAGNOSIS — R9430 Abnormal result of cardiovascular function study, unspecified: Secondary | ICD-10-CM

## 2010-12-25 DIAGNOSIS — R079 Chest pain, unspecified: Secondary | ICD-10-CM | POA: Insufficient documentation

## 2010-12-25 DIAGNOSIS — I10 Essential (primary) hypertension: Secondary | ICD-10-CM | POA: Insufficient documentation

## 2010-12-25 DIAGNOSIS — Z87891 Personal history of nicotine dependence: Secondary | ICD-10-CM | POA: Insufficient documentation

## 2010-12-25 DIAGNOSIS — C50919 Malignant neoplasm of unspecified site of unspecified female breast: Secondary | ICD-10-CM

## 2010-12-25 MED ORDER — TECHNETIUM TC 99M-LABELED RED BLOOD CELLS IV KIT
33.0000 | PACK | Freq: Once | INTRAVENOUS | Status: AC | PRN
  Administered 2010-12-25: 33 via INTRAVENOUS

## 2010-12-25 NOTE — Progress Notes (Signed)
Mug Study  Indication: 18 g IV est. to right AC by Stanton Kidney, EMTP.  Indication: Evaluation for ischemia, Hx of Breast cancer s/p stem cell transplant.  History: '07 Echo: NL; '07 MPS: NL; '10 Stress Echo: NL: 5/12 Stress Echo: \/ LV function, EF=35% rest, 55% stress.  Symptoms: Chest pain.  Cardiac risk factors: H/O smoking, HTN  Muga Information:  The patient's red blood cells were labeled using the Ultra Tag method with 33 mci of Technetium 51m Pertechnetate.  The images were reconstructed in the Anterior, Lateral and Left Anterior oblique views.  Impression: Normal LV size and wall motion.  EF calculated to 57%.   Eileen [name] [employer]

## 2010-12-26 NOTE — Progress Notes (Addendum)
COPY ROUTED TO DR. CRENSHAW.Scarlette Ar

## 2010-12-27 NOTE — Progress Notes (Signed)
Patient is aware of test/lab results.  

## 2011-03-12 ENCOUNTER — Other Ambulatory Visit: Payer: Self-pay | Admitting: Hematology and Oncology

## 2011-03-12 ENCOUNTER — Encounter (HOSPITAL_BASED_OUTPATIENT_CLINIC_OR_DEPARTMENT_OTHER): Payer: Managed Care, Other (non HMO) | Admitting: Hematology and Oncology

## 2011-03-12 DIAGNOSIS — C50919 Malignant neoplasm of unspecified site of unspecified female breast: Secondary | ICD-10-CM

## 2011-03-12 DIAGNOSIS — C180 Malignant neoplasm of cecum: Secondary | ICD-10-CM

## 2011-03-12 LAB — COMPREHENSIVE METABOLIC PANEL
ALT: 21 U/L (ref 0–35)
AST: 20 U/L (ref 0–37)
Albumin: 4.2 g/dL (ref 3.5–5.2)
Alkaline Phosphatase: 60 U/L (ref 39–117)
BUN: 16 mg/dL (ref 6–23)
CO2: 24 mEq/L (ref 19–32)
Calcium: 9.6 mg/dL (ref 8.4–10.5)
Chloride: 105 mEq/L (ref 96–112)
Creatinine, Ser: 0.74 mg/dL (ref 0.50–1.10)
Glucose, Bld: 93 mg/dL (ref 70–99)
Potassium: 4.5 mEq/L (ref 3.5–5.3)
Sodium: 139 mEq/L (ref 135–145)
Total Bilirubin: 0.6 mg/dL (ref 0.3–1.2)
Total Protein: 6.3 g/dL (ref 6.0–8.3)

## 2011-03-12 LAB — CBC WITH DIFFERENTIAL/PLATELET
BASO%: 0.6 % (ref 0.0–2.0)
Basophils Absolute: 0 10*3/uL (ref 0.0–0.1)
EOS%: 1.2 % (ref 0.0–7.0)
Eosinophils Absolute: 0.1 10*3/uL (ref 0.0–0.5)
HCT: 38.3 % (ref 34.8–46.6)
HGB: 12.9 g/dL (ref 11.6–15.9)
LYMPH%: 28.5 % (ref 14.0–49.7)
MCH: 29.5 pg (ref 25.1–34.0)
MCHC: 33.6 g/dL (ref 31.5–36.0)
MCV: 87.9 fL (ref 79.5–101.0)
MONO#: 0.4 10*3/uL (ref 0.1–0.9)
MONO%: 7.4 % (ref 0.0–14.0)
NEUT#: 3.7 10*3/uL (ref 1.5–6.5)
NEUT%: 62.3 % (ref 38.4–76.8)
Platelets: 203 10*3/uL (ref 145–400)
RBC: 4.36 10*6/uL (ref 3.70–5.45)
RDW: 12.7 % (ref 11.2–14.5)
WBC: 5.9 10*3/uL (ref 3.9–10.3)
lymph#: 1.7 10*3/uL (ref 0.9–3.3)

## 2011-03-12 LAB — LACTATE DEHYDROGENASE: LDH: 170 U/L (ref 94–250)

## 2011-03-12 LAB — CEA: CEA: 1.9 ng/mL (ref 0.0–5.0)

## 2011-03-12 LAB — CANCER ANTIGEN 27.29: CA 27.29: 34 U/mL (ref 0–39)

## 2011-03-15 ENCOUNTER — Encounter (HOSPITAL_BASED_OUTPATIENT_CLINIC_OR_DEPARTMENT_OTHER): Payer: Managed Care, Other (non HMO) | Admitting: Hematology and Oncology

## 2011-03-15 DIAGNOSIS — C50919 Malignant neoplasm of unspecified site of unspecified female breast: Secondary | ICD-10-CM

## 2011-03-15 DIAGNOSIS — C180 Malignant neoplasm of cecum: Secondary | ICD-10-CM

## 2011-04-22 LAB — BASIC METABOLIC PANEL
BUN: 11
CO2: 26
Calcium: 9.3
Chloride: 104
Creatinine, Ser: 0.7
GFR calc Af Amer: >60
GFR calc non Af Amer: >60
Glucose, Bld: 87
Potassium: 3.8
Sodium: 137

## 2011-04-22 LAB — HEMOGLOBIN AND HEMATOCRIT, BLOOD
HCT: 38.1
Hemoglobin: 12.7

## 2011-04-29 ENCOUNTER — Other Ambulatory Visit: Payer: Self-pay | Admitting: Family Medicine

## 2011-04-29 DIAGNOSIS — M899 Disorder of bone, unspecified: Secondary | ICD-10-CM

## 2011-04-29 DIAGNOSIS — Z1231 Encounter for screening mammogram for malignant neoplasm of breast: Secondary | ICD-10-CM

## 2011-04-29 LAB — BASIC METABOLIC PANEL WITH GFR
BUN: 13
CO2: 29
Calcium: 9.5
Chloride: 108
Creatinine, Ser: 0.82
GFR calc non Af Amer: >60
Glucose, Bld: 95
Potassium: 4.4
Sodium: 143

## 2011-04-29 LAB — HEMOGLOBIN AND HEMATOCRIT, BLOOD
HCT: 37.5
Hemoglobin: 12.6

## 2011-06-24 ENCOUNTER — Ambulatory Visit
Admission: RE | Admit: 2011-06-24 | Discharge: 2011-06-24 | Disposition: A | Discharge status: Home / Self Care | Payer: Managed Care, Other (non HMO) | Source: Ambulatory Visit | Attending: Family Medicine | Admitting: Family Medicine

## 2011-06-24 DIAGNOSIS — Z1231 Encounter for screening mammogram for malignant neoplasm of breast: Secondary | ICD-10-CM

## 2011-06-24 DIAGNOSIS — M899 Disorder of bone, unspecified: Secondary | ICD-10-CM

## 2011-09-03 ENCOUNTER — Ambulatory Visit (AMBULATORY_SURGERY_CENTER): Payer: Managed Care, Other (non HMO) | Admitting: *Deleted

## 2011-09-03 VITALS — Ht 65.0 in | Wt 148.5 lb

## 2011-09-03 DIAGNOSIS — Z1211 Encounter for screening for malignant neoplasm of colon: Secondary | ICD-10-CM

## 2011-09-03 MED ORDER — MOVIPREP 100 G PO SOLR: ORAL | Status: DC

## 2011-09-04 ENCOUNTER — Encounter: Payer: Self-pay | Admitting: Internal Medicine

## 2011-09-16 ENCOUNTER — Encounter: Payer: Self-pay | Admitting: *Deleted

## 2011-09-17 ENCOUNTER — Encounter: Payer: Self-pay | Admitting: Internal Medicine

## 2011-09-17 ENCOUNTER — Ambulatory Visit (AMBULATORY_SURGERY_CENTER): Payer: Managed Care, Other (non HMO) | Admitting: Internal Medicine

## 2011-09-17 DIAGNOSIS — Z85038 Personal history of other malignant neoplasm of large intestine: Secondary | ICD-10-CM

## 2011-09-17 DIAGNOSIS — Z1211 Encounter for screening for malignant neoplasm of colon: Secondary | ICD-10-CM

## 2011-09-17 MED ORDER — SODIUM CHLORIDE 0.9 % IV SOLN: 500.0000 mL | INTRAVENOUS | Status: DC

## 2011-09-17 NOTE — Progress Notes (Signed)
Patient did not experience any of the following events: a burn prior to discharge; a fall within the facility; wrong site/side/patient/procedure/implant event; or a hospital transfer or hospital admission upon discharge from the facility. (G8907) Patient did not have preoperative order for IV antibiotic SSI prophylaxis. (G8918)  

## 2011-09-17 NOTE — Patient Instructions (Signed)
YOU HAD AN ENDOSCOPIC PROCEDURE TODAY AT THE [facility]: Refer to the procedure report that was given to you for any specific questions about what was found during the examination.  If the procedure report does not answer your questions, please call your gastroenterologist to clarify.  If you requested that your care partner not be given the details of your procedure findings, then the procedure report has been included in a sealed envelope for you to review at your convenience later.  YOU SHOULD EXPECT: Some feelings of bloating in the abdomen. Passage of more gas than usual.  Walking can help get rid of the air that was put into your GI tract during the procedure and reduce the bloating. If you had a lower endoscopy (such as a colonoscopy or flexible sigmoidoscopy) you may notice spotting of blood in your stool or on the toilet paper. If you underwent a bowel prep for your procedure, then you may not have a normal bowel movement for a few days.  DIET: Your first meal following the procedure should be a light meal and then it is ok to progress to your normal diet.  A half-sandwich or bowl of soup is an example of a good first meal.  Heavy or fried foods are harder to digest and may make you feel nauseous or bloated.  Likewise meals heavy in dairy and vegetables can cause extra gas to form and this can also increase the bloating.  Drink plenty of fluids but you should avoid alcoholic beverages for 24 hours.  ACTIVITY: Your care partner should take you home directly after the procedure.  You should plan to take it easy, moving slowly for the rest of the day.  You can resume normal activity the day after the procedure however you should NOT DRIVE or use heavy machinery for 24 hours (because of the sedation medicines used during the test).    SYMPTOMS TO REPORT IMMEDIATELY: A gastroenterologist can be reached at any hour.  During normal business hours, 8:30 AM to 5:00 PM Monday through Friday,  call [phone].  After hours and on weekends, please call the GI answering service at [phone] who will take a message and have the physician on call contact you.   Following lower endoscopy (colonoscopy or flexible sigmoidoscopy):  Excessive amounts of blood in the stool  Significant tenderness or worsening of abdominal pains  Swelling of the abdomen that is new, acute  Fever of 100F or higher Hemorrhoids Hemorrhoids are enlarged (dilated) veins around the rectum. There are 2 types of hemorrhoids, and the type of hemorrhoid is determined by its location. Internal hemorrhoids occur in the veins just inside the rectum.They are usually not painful, but they may bleed.However, they may poke through to the outside and become irritated and painful. External hemorrhoids involve the veins outside the anus and can be felt as a painful swelling or hard lump near the anus.They are often itchy and may crack and bleed. Sometimes clots will form in the veins. This makes them swollen and painful. These are called thrombosed hemorrhoids. CAUSES Causes of hemorrhoids include:  Pregnancy. This increases the pressure in the hemorrhoidal veins.   Constipation.   Straining to have a bowel movement.   Obesity.   Heavy lifting or other activity that caused you to strain.  TREATMENT Most of the time hemorrhoids improve in 1 to 2 weeks. However, if symptoms do not seem to be getting better or if you have a lot of rectal  bleeding, your caregiver may perform a procedure to help make the hemorrhoids get smaller or remove them completely.Possible treatments include:  Rubber band ligation. A rubber band is placed at the base of the hemorrhoid to cut off the circulation.   Sclerotherapy. A chemical is injected to shrink the hemorrhoid.   Infrared light therapy. Tools are used to burn the hemorrhoid.   Hemorrhoidectomy. This is surgical removal of the hemorrhoid.  HOME CARE INSTRUCTIONS    Increase fiber in your diet. Ask your caregiver about using fiber supplements.   Drink enough water and fluids to keep your urine clear or pale yellow.   Exercise regularly.   Go to the bathroom when you have the urge to have a bowel movement. Do not wait.   Avoid straining to have bowel movements.   Keep the anal area dry and clean.   Only take over-the-counter or prescription medicines for pain, discomfort, or fever as directed by your caregiver.  If your hemorrhoids are thrombosed:  Take warm sitz baths for 20 to 30 minutes, 3 to 4 times per day.   If the hemorrhoids are very tender and swollen, place ice packs on the area as tolerated. Using ice packs between sitz baths may be helpful. Fill a plastic bag with ice. Place a towel between the bag of ice and your skin.   Medicated creams and suppositories may be used or applied as directed.   Do not use a donut-shaped pillow or sit on the toilet for long periods. This increases blood pooling and pain.  SEEK MEDICAL CARE IF:   You have increasing pain and swelling that is not controlled with your medicine.   You have uncontrolled bleeding.   You have difficulty or you are unable to have a bowel movement.   You have pain or inflammation outside the area of the hemorrhoids.   You have chills or an oral temperature above 102 F (38.9 C).  MAKE SURE YOU:   Understand these instructions.   Will watch your condition.   Will get help right away if you are not doing well or get worse.  Document Released: 07/12/2000 Document Revised: 03/27/2011 Document Reviewed: 11/17/2007 [facility] Patient Information 2012 [place], [place]. FOLLOW UP: If any biopsies were taken you will be contacted by phone or by letter within the next 1-3 weeks.  Call your gastroenterologist if you have not heard about the biopsies in 3 weeks.  Our staff will call the home number listed on your records the next business day following your procedure to check  on you and address any questions or concerns that you may have at that time regarding the information given to you following your procedure. This is a courtesy call and so if there is no answer at the home number and we have not heard from you through the emergency physician on call, we will assume that you have returned to your regular daily activities without incident.  SIGNATURES/CONFIDENTIALITY: You and/or your care partner have signed paperwork which will be entered into your electronic medical record.  These signatures attest to the fact that that the information above on your After Visit Summary has been reviewed and is understood.  Full responsibility of the confidentiality of this discharge information lies with you and/or your care-partner.

## 2011-09-17 NOTE — Op Note (Signed)
[facility] [address] [place], [place]  [address]  COLONOSCOPY PROCEDURE REPORT  PATIENT:  Eileen [name], Eileen [name]  MR#:  [id number] BIRTHDATE:  1946/06/19, 65 yrs. old  GENDER:  female ENDOSCOPIST:  [name]. [name], MD REF. BY:  Holley Bouche, M.D. PROCEDURE DATE:  09/17/2011 PROCEDURE:  Colonoscopy 84132 ASA CLASS:  Class II INDICATIONS:  history of colon cancer s/p right hemicolectomy for cecal carcinoma 2006, last colon 08/2008, doing well, hyperplastic polyp in 2008 MEDICATIONS:   MAC sedation, administered by CRNA, propofol (Diprivan) 200 mg  DESCRIPTION OF PROCEDURE:   After the risks and benefits and of the procedure were explained, informed consent was obtained. Digital rectal exam was performed and revealed no rectal masses. The LB CF-[id number] endoscope was introduced through the anus and advanced to the anastomosis.  The quality of the prep was good, using MoviPrep.  The instrument was then slowly withdrawn as the colon was fully examined. <<PROCEDUREIMAGES>>  FINDINGS:  The right colon was surgically resected and an ileo-colonic anastamosis was seen (see image1 and image2). widely patent ileo-colic anastomosis  Internal Hemorrhoids were found (see image3).   Retroflexed views in the rectum revealed no abnormalities.    The scope was then withdrawn from the patient and the procedure completed.  COMPLICATIONS:  None ENDOSCOPIC IMPRESSION: 1) Prior right hemi-colectomy 2) Internal hemorrhoids no evidence of recurrent cancer RECOMMENDATIONS: 1) High fiber diet.  REPEAT EXAM:  In 3 year(s) for.  ______________________________ [name]. [name], MD  CC:  n. eSIGNED:   [name]. Koreena Joost at 09/17/2011 02:33 PM  Adah Salvage, [phone]

## 2011-09-18 ENCOUNTER — Telehealth: Payer: Self-pay

## 2011-09-18 NOTE — Telephone Encounter (Signed)
  Follow up Call-  Call back number 09/17/2011  Post procedure Call Back phone  # [phone]  Permission to leave phone message Yes     Patient questions:  Do you have a fever, pain , or abdominal swelling? no Pain Score  0 *  Have you tolerated food without any problems? yes  Have you been able to return to your normal activities? yes  Do you have any questions about your discharge instructions: Diet   no Medications  no Follow up visit  no  Do you have questions or concerns about your Care? no  Actions: * If pain score is 4 or above: No action needed, pain <4.

## 2011-10-11 ENCOUNTER — Ambulatory Visit (INDEPENDENT_AMBULATORY_CARE_PROVIDER_SITE_OTHER): Payer: Managed Care, Other (non HMO) | Admitting: Cardiology

## 2011-10-11 ENCOUNTER — Encounter: Payer: Self-pay | Admitting: Cardiology

## 2011-10-11 VITALS — BP 158/86 | HR 73 | Ht 65.0 in | Wt 147.1 lb

## 2011-10-11 DIAGNOSIS — I10 Essential (primary) hypertension: Secondary | ICD-10-CM

## 2011-10-11 DIAGNOSIS — I493 Ventricular premature depolarization: Secondary | ICD-10-CM | POA: Insufficient documentation

## 2011-10-11 DIAGNOSIS — I4949 Other premature depolarization: Secondary | ICD-10-CM

## 2011-10-11 NOTE — Assessment & Plan Note (Signed)
Blood pressure mildly elevated. However she states normal at home. She will follow and her medications can be increased as needed. I will leave this to primary care.

## 2011-10-11 NOTE — Assessment & Plan Note (Signed)
Patient had asymptomatic PVCs and transient junctional rhythm with a heart rate of 58-60 during colonoscopy. She has no dyspnea, chest pain, palpitations and no syncope. Last evaluation of LV function normal. I do not think further cardiac workup indicated.

## 2011-10-11 NOTE — Progress Notes (Signed)
   HPI: Pleasant female for FU of hypertension. Seen in 2007 for evaluation of shortness of breath. Echocardiogram was normal and a exercise Myoview scan was normal. Stress echocardiogram in July of 2010 was normal. Stress echo in  5/12 revealed per Dr. Myrtis Ser moderately reduced LV function at rest with an ejection fraction of 35% but improvement in all segments with exercise to an ejection fraction of 55%. I reviewed this and felt baseline LV function was better than above; MUGA in May of 2012 showed EF 57. Patient had a colonoscopy recently. While on telemetry she was noted to have occasional PVCs and brief junctional rhythm. There were no symptoms. She denies dyspnea, chest pain, palpitations or syncope.   Current Outpatient Prescriptions  Medication Sig Dispense Refill  . CALCIUM-VITAMIN D PO Take by mouth daily.        . folic acid (FOLVITE) 400 MCG tablet Take 400 mcg by mouth as needed.       Marland Kitchen letrozole (FEMARA) 2.5 MG tablet Take 2.5 mg by mouth daily.        Marland Kitchen losartan (COZAAR) 25 MG tablet Take 25 mg by mouth daily.        . Vitamin D, Ergocalciferol, (DRISDOL) 50000 UNITS CAPS       . vitamin E 100 UNIT capsule Take 100 Units by mouth daily.         Past Medical History  Diagnosis Date  . CARCINOMA, BREAST   . COLON CANCER   . HEMORRHOIDS   . Hypertension     Past Surgical History  Procedure Date  . Bone marrow transplant 1999  . Total abdominal hysterectomy w/ bilateral salpingoophorectomy 1996  . Hemicolectomy 01/2005    right  . Breast lumpectomy     left breast    History   Social History  . Marital Status: Married    Spouse Name: [name]    Number of Children: [name]  . Years of Education: [name]   Occupational History  . Not on file.   Social History Main Topics  . Smoking status: Former Smoker    Quit date: 12/20/1990  . Smokeless tobacco: Not on file  . Alcohol Use: No  . Drug Use: Not on file  . Sexually Active: Not on file   Other Topics Concern  . Not on  file   Social History Narrative  . No narrative on file    ROS: no fevers or chills, productive cough, hemoptysis, dysphasia, odynophagia, melena, hematochezia, dysuria, hematuria, rash, seizure activity, orthopnea, PND, pedal edema, claudication. Remaining systems are negative.  Physical Exam: Well-developed well-nourished in no acute distress.  Skin is warm and dry.  HEENT is normal.  Neck is supple. No thyromegaly.  Chest is clear to auscultation with normal expansion.  Cardiovascular exam is regular rate and rhythm.  Abdominal exam nontender or distended. No masses palpated. Extremities show no edema. neuro grossly intact  ECG sinus rhythm at a rate of 73. Axis normal. No ST changes.

## 2011-10-17 ENCOUNTER — Ambulatory Visit (INDEPENDENT_AMBULATORY_CARE_PROVIDER_SITE_OTHER): Payer: Self-pay | Admitting: General Surgery

## 2011-10-31 ENCOUNTER — Ambulatory Visit (INDEPENDENT_AMBULATORY_CARE_PROVIDER_SITE_OTHER): Payer: Managed Care, Other (non HMO) | Admitting: General Surgery

## 2011-10-31 ENCOUNTER — Encounter (INDEPENDENT_AMBULATORY_CARE_PROVIDER_SITE_OTHER): Payer: Self-pay | Admitting: General Surgery

## 2011-10-31 ENCOUNTER — Telehealth (INDEPENDENT_AMBULATORY_CARE_PROVIDER_SITE_OTHER): Payer: Self-pay

## 2011-10-31 VITALS — BP 150/80 | HR 86 | Temp 98.2°F | Resp 18 | Ht 65.0 in | Wt 147.0 lb

## 2011-10-31 DIAGNOSIS — C50919 Malignant neoplasm of unspecified site of unspecified female breast: Secondary | ICD-10-CM

## 2011-10-31 DIAGNOSIS — C189 Malignant neoplasm of colon, unspecified: Secondary | ICD-10-CM

## 2011-10-31 DIAGNOSIS — R1011 Right upper quadrant pain: Secondary | ICD-10-CM

## 2011-10-31 NOTE — Progress Notes (Signed)
Patient ID: Eileen [name], female   DOB: 03-Jan-1946, 66 y.o.   MRN: [id number]  Chief Complaint  Patient presents with  . Follow-up    breast and colon ck    HPI Eileen [name] is a 66 y.o. female.  She returns for long-term followup of her colon cancer and breast cancer.  In 1995 she presented with a small cancer in the left breast. She underwent left partial mastectomy and axillary lymph node dissection. This was a 1.5 cm tumor,stage TI C. N0.    Dr. Donnie Coffin was involved in her care. She recurred in the left supraclavicular nodes and I biopsied that node, the Hickman catheter in her, and she went to dig and had a stem cell transplant. Today she has no known regional or metastatic breast disease.  In 2006 she developed a right colon cancer. I performed a right colectomy for what turned out to be a stage T3 N0 tumor. She is now followed by Dr. Dalene Carrow who does her lab work.  Colonoscopy performed on September 17, 2011 by Dr. [name]  is normal with no evidence of disease.  Bilateral mammograms performed June 24, 2011 her category one, no evidence of disease.  She has no complaints about her digestive function and no complaints about her breast. Her weight has been stable. Her only reported complaint was a 2 year history of intermittent episodes of right upper quadrant pain. She says the pain occurs about once a month and last about one hour. There are no aggravating or alleviating factors. There is no GI disturbance.  Denies chest wall tenderness.  This has never been evaluated, and she states that she has never reported this to any other physician. She wanted my opinion. HPI  Past Medical History  Diagnosis Date  . CARCINOMA, BREAST   . COLON CANCER   . HEMORRHOIDS   . Hypertension     Past Surgical History  Procedure Date  . Bone marrow transplant 1999  . Total abdominal hysterectomy w/ bilateral salpingoophorectomy 1996  . Hemicolectomy 01/2005    right  . Breast lumpectomy    left breast    Family History  Problem Relation Age of Onset  . Uterine cancer Mother   . Heart attack Father   . Colon cancer Neg Hx   . Esophageal cancer Neg Hx   . Stomach cancer Neg Hx   . Rectal cancer Neg Hx     Social History History  Substance Use Topics  . Smoking status: Former Smoker    Quit date: 12/20/1990  . Smokeless tobacco: Not on file  . Alcohol Use: No    No Known Allergies  Current Outpatient Prescriptions  Medication Sig Dispense Refill  . CALCIUM-VITAMIN D PO Take by mouth daily.        . folic acid (FOLVITE) 400 MCG tablet Take 400 mcg by mouth as needed.       Marland Kitchen letrozole (FEMARA) 2.5 MG tablet Take 2.5 mg by mouth daily.        Marland Kitchen losartan (COZAAR) 25 MG tablet Take 25 mg by mouth daily.        . Vitamin D, Ergocalciferol, (DRISDOL) 50000 UNITS CAPS       . vitamin E 100 UNIT capsule Take 100 Units by mouth daily.        Review of Systems Review of Systems  Constitutional: Negative for fever, chills, diaphoresis and unexpected weight change.  HENT: Negative for hearing loss, congestion, sore throat, trouble swallowing and voice  change.   Eyes: Negative for redness and visual disturbance.  Respiratory: Negative for cough and wheezing.   Cardiovascular: Negative for chest pain, palpitations and leg swelling.  Gastrointestinal: Positive for abdominal pain. Negative for nausea, vomiting, diarrhea, constipation, blood in stool, abdominal distention and anal bleeding.  Genitourinary: Negative for hematuria, vaginal bleeding and difficulty urinating.  Musculoskeletal: Negative for arthralgias.  Skin: Negative for rash and wound.  Neurological: Negative for seizures, syncope and headaches.  Hematological: Negative for adenopathy. Does not bruise/bleed easily.  Psychiatric/Behavioral: Negative for confusion.    Blood pressure 150/80, pulse 86, temperature 98.2 F (36.8 C), resp. rate 18, height 5\' 5"  (1.651 m), weight 147 lb (66.679 kg).  Physical  Exam Physical Exam  Constitutional: She is oriented to person, place, and time. She appears well-developed and well-nourished. No distress.  HENT:  Head: Normocephalic and atraumatic.  Nose: Nose normal.  Mouth/Throat: No oropharyngeal exudate.  Eyes: Conjunctivae and EOM are normal. Pupils are equal, round, and reactive to light. Left eye exhibits no discharge. No scleral icterus.  Neck: Neck supple. No JVD present. No tracheal deviation present. No thyromegaly present.  Cardiovascular: Normal rate, regular rhythm, normal heart sounds and intact distal pulses.   No murmur heard. Pulmonary/Chest: Effort normal and breath sounds normal. No respiratory distress. She has no wheezes. She has no rales. She exhibits no tenderness.    Abdominal: Soft. Bowel sounds are normal. She exhibits no distension and no mass. There is no tenderness. There is no rebound and no guarding.       Well-healed surgical scar. No mass. No hernia. Right upper quadrant nontender without mass. No tenderness of right chest wall or costal margin.  Musculoskeletal: She exhibits no edema and no tenderness.       No swelling or tenderness left forearm.  Lymphadenopathy:    She has no cervical adenopathy.  Neurological: She is alert and oriented to person, place, and time. She exhibits normal muscle tone. Coordination normal.  Skin: Skin is warm. No rash noted. She is not diaphoretic. No erythema. No pallor.  Psychiatric: She has a normal mood and affect. Her behavior is normal. Judgment and thought content normal.    Data Reviewed I reviewed her recent colonoscopy and mammogram reports and images. I reviewed all of my old records. I reviewed notes from the [facility].  Assessment    History stage I cancer left breast, status post left partial mastectomy and axillary lymph node dissection 1995. Status post metastatic breast cancer to left supraclavicular area, treated with stem cell transplant and  chemotherapy. No evidence of recurrent disease today.  History of adenocarcinoma number right colon, stage TIII N0, no evidence of recurrence 7 years postop right colectomy(2006).  2 year history intermittent episodes right upper quadrant pain. Etiology unclear. Considerations would be biliary tract disease, occult liver disease, or other more obscure process. Intermittent history suggest non-neoplastic process, but considering her history further evaluation is warranted.    Plan    For her right upper quadrant pain, we will simply start with an abdominal ultrasound. We will discuss this on the phone after this is done.  If nothing is found on the ultrasound we may be forced to proceed with CT scanning.  Bilateral mammograms in one  year, November 2013.  Colonoscopy every 3 years.  Otherwise followup with me in one year.  She will continue to follow with  Dr. Leonides Sake, and Dr. Dalene Carrow.       [name].  [name], M.D., [facility] General and Minimally invasive Surgery Breast and Colorectal Surgery Office:   [phone] Pager:   [phone]  10/31/2011, 11:21 AM

## 2011-10-31 NOTE — Telephone Encounter (Signed)
error 

## 2011-10-31 NOTE — Patient Instructions (Signed)
Your physical exam today reveals no abnormality or evidence of cancer. Your recent mammograms and colonoscopy are normal, and that is very reassuring.  The intermittent episodes of right upper quadrant pain should be investigated. This may be your gallbladder or some other problem. You will be scheduled for a gallbladder ultrasound, and we need to discuss this on the phone after that test is done.  I advise colonoscopy every 3 years with Dr. Dickie La. I advise a mammograms yearly.   Keep your  regular appointment with Dr. Dalene Carrow.  Otherwise, return to see Dr. [name] in one year.

## 2011-11-04 ENCOUNTER — Encounter (HOSPITAL_COMMUNITY): Payer: Self-pay | Admitting: Emergency Medicine

## 2011-11-04 ENCOUNTER — Inpatient Hospital Stay (HOSPITAL_COMMUNITY)
Admission: EM | Admit: 2011-11-04 | Discharge: 2011-11-05 | DRG: 312 | Disposition: A | Discharge status: Home / Self Care | Payer: Managed Care, Other (non HMO) | Attending: Internal Medicine | Admitting: Internal Medicine

## 2011-11-04 ENCOUNTER — Emergency Department (HOSPITAL_COMMUNITY): Payer: Managed Care, Other (non HMO)

## 2011-11-04 ENCOUNTER — Other Ambulatory Visit: Payer: Self-pay

## 2011-11-04 DIAGNOSIS — A088 Other specified intestinal infections: Secondary | ICD-10-CM | POA: Diagnosis present

## 2011-11-04 DIAGNOSIS — R5381 Other malaise: Secondary | ICD-10-CM | POA: Diagnosis present

## 2011-11-04 DIAGNOSIS — R200 Anesthesia of skin: Secondary | ICD-10-CM | POA: Diagnosis present

## 2011-11-04 DIAGNOSIS — C189 Malignant neoplasm of colon, unspecified: Secondary | ICD-10-CM | POA: Diagnosis present

## 2011-11-04 DIAGNOSIS — E860 Dehydration: Secondary | ICD-10-CM | POA: Diagnosis present

## 2011-11-04 DIAGNOSIS — C50919 Malignant neoplasm of unspecified site of unspecified female breast: Secondary | ICD-10-CM | POA: Diagnosis present

## 2011-11-04 DIAGNOSIS — N390 Urinary tract infection, site not specified: Secondary | ICD-10-CM | POA: Diagnosis present

## 2011-11-04 DIAGNOSIS — I10 Essential (primary) hypertension: Secondary | ICD-10-CM | POA: Diagnosis present

## 2011-11-04 DIAGNOSIS — Z9481 Bone marrow transplant status: Secondary | ICD-10-CM

## 2011-11-04 DIAGNOSIS — K5900 Constipation, unspecified: Secondary | ICD-10-CM | POA: Diagnosis present

## 2011-11-04 DIAGNOSIS — K5909 Other constipation: Secondary | ICD-10-CM | POA: Diagnosis present

## 2011-11-04 DIAGNOSIS — G459 Transient cerebral ischemic attack, unspecified: Secondary | ICD-10-CM | POA: Diagnosis present

## 2011-11-04 DIAGNOSIS — Z85038 Personal history of other malignant neoplasm of large intestine: Secondary | ICD-10-CM

## 2011-11-04 DIAGNOSIS — Z85830 Personal history of malignant neoplasm of bone: Secondary | ICD-10-CM

## 2011-11-04 DIAGNOSIS — I4949 Other premature depolarization: Secondary | ICD-10-CM | POA: Diagnosis present

## 2011-11-04 DIAGNOSIS — I493 Ventricular premature depolarization: Secondary | ICD-10-CM | POA: Diagnosis present

## 2011-11-04 DIAGNOSIS — Z853 Personal history of malignant neoplasm of breast: Secondary | ICD-10-CM | POA: Diagnosis present

## 2011-11-04 DIAGNOSIS — R531 Weakness: Secondary | ICD-10-CM | POA: Diagnosis present

## 2011-11-04 DIAGNOSIS — Z9049 Acquired absence of other specified parts of digestive tract: Secondary | ICD-10-CM

## 2011-11-04 DIAGNOSIS — R209 Unspecified disturbances of skin sensation: Secondary | ICD-10-CM | POA: Diagnosis present

## 2011-11-04 DIAGNOSIS — Z79899 Other long term (current) drug therapy: Secondary | ICD-10-CM

## 2011-11-04 DIAGNOSIS — Z800 Family history of malignant neoplasm of digestive organs: Secondary | ICD-10-CM

## 2011-11-04 DIAGNOSIS — R55 Syncope and collapse: Principal | ICD-10-CM | POA: Diagnosis present

## 2011-11-04 LAB — COMPREHENSIVE METABOLIC PANEL
ALT: 44 U/L — ABNORMAL HIGH (ref 0–35)
AST: 33 U/L (ref 0–37)
Albumin: 3.6 g/dL (ref 3.5–5.2)
Alkaline Phosphatase: 62 U/L (ref 39–117)
BUN: 15 mg/dL (ref 6–23)
CO2: 22 mEq/L (ref 19–32)
Calcium: 8.8 mg/dL (ref 8.4–10.5)
Chloride: 101 mEq/L (ref 96–112)
Creatinine, Ser: 0.68 mg/dL (ref 0.50–1.10)
GFR calc Af Amer: >90 mL/min (ref >90)
GFR calc non Af Amer: >90 mL/min (ref >90)
Glucose, Bld: 124 mg/dL — ABNORMAL HIGH (ref 70–99)
Potassium: 3.5 mEq/L (ref 3.5–5.1)
Sodium: 137 mEq/L (ref 135–145)
Total Bilirubin: 1 mg/dL (ref 0.3–1.2)
Total Protein: 6.2 g/dL (ref 6.0–8.3)

## 2011-11-04 LAB — DIFFERENTIAL
Basophils Absolute: 0 10*3/uL (ref 0.0–0.1)
Basophils Relative: 0 % (ref 0–1)
Eosinophils Absolute: 0 10*3/uL (ref 0.0–0.7)
Eosinophils Relative: 0 % (ref 0–5)
Lymphocytes Relative: 9 % — ABNORMAL LOW (ref 12–46)
Lymphs Abs: 0.8 10*3/uL (ref 0.7–4.0)
Monocytes Absolute: 0.6 10*3/uL (ref 0.1–1.0)
Monocytes Relative: 7 % (ref 3–12)
Neutro Abs: 7.6 10*3/uL (ref 1.7–7.7)
Neutrophils Relative %: 85 % — ABNORMAL HIGH (ref 43–77)

## 2011-11-04 LAB — URINALYSIS, ROUTINE W REFLEX MICROSCOPIC
Bilirubin Urine: NEGATIVE
Glucose, UA: 100 mg/dL — AB
Hgb urine dipstick: NEGATIVE
Ketones, ur: >80 mg/dL — AB
Nitrite: NEGATIVE
Protein, ur: 100 mg/dL — AB
Specific Gravity, Urine: 1.018 (ref 1.005–1.030)
Urobilinogen, UA: 1 mg/dL (ref 0.0–1.0)
pH: 7.5 (ref 5.0–8.0)

## 2011-11-04 LAB — CBC
HCT: 41.2 % (ref 36.0–46.0)
Hemoglobin: 13.9 g/dL (ref 12.0–15.0)
MCH: 28.8 pg (ref 26.0–34.0)
MCHC: 33.7 g/dL (ref 30.0–36.0)
MCV: 85.5 fL (ref 78.0–100.0)
Platelets: 199 10*3/uL (ref 150–400)
RBC: 4.82 MIL/uL (ref 3.87–5.11)
RDW: 12.9 % (ref 11.5–15.5)
WBC: 9 10*3/uL (ref 4.0–10.5)

## 2011-11-04 LAB — URINE MICROSCOPIC-ADD ON

## 2011-11-04 LAB — POCT I-STAT TROPONIN I: Troponin i, poc: 0 ng/mL (ref 0.00–0.08)

## 2011-11-04 MED ORDER — SODIUM CHLORIDE 0.9 % IV BOLUS (SEPSIS)
500.0000 mL | Freq: Once | INTRAVENOUS | Status: AC
  Administered 2011-11-04: 500 mL via INTRAVENOUS

## 2011-11-04 MED ORDER — SODIUM CHLORIDE 0.9 % IV SOLN
INTRAVENOUS | Status: DC
  Administered 2011-11-05: via INTRAVENOUS

## 2011-11-04 NOTE — ED Notes (Signed)
Pt states she has had 2 syncopal episodes today once this morning and then again this evening  Pt is c/o left sided numbness that started on her way here  Pt is pale and slightly diaphoretic

## 2011-11-04 NOTE — ED Provider Notes (Signed)
History     CSN: [id number]  Arrival date & time 11/04/11  8295   First MD Initiated Contact with Patient 11/04/11 2057      Chief Complaint  Patient presents with  . Loss of Consciousness    (Consider location/radiation/quality/duration/timing/severity/associated sxs/prior treatment) HPI.... level V caveat for urgent need for intervention.  History obtained from husband.  Patient had 2 syncopal spells today, one this morning, 1 this evening. Complains of bilateral weakness left side worse than right. This has never happened before. Nothing makes it better or worse. Described as moderate to severe no chest pain, dyspnea, dysuria, meningeal signs, fever, chills  Past Medical History  Diagnosis Date  . CARCINOMA, BREAST   . COLON CANCER   . HEMORRHOIDS   . Hypertension     Past Surgical History  Procedure Date  . Bone marrow transplant 1999  . Total abdominal hysterectomy w/ bilateral salpingoophorectomy 1996  . Hemicolectomy 01/2005    right  . Breast lumpectomy     left breast    Family History  Problem Relation Age of Onset  . Uterine cancer Mother   . Heart attack Father   . Colon cancer Neg Hx   . Esophageal cancer Neg Hx   . Stomach cancer Neg Hx   . Rectal cancer Neg Hx     History  Substance Use Topics  . Smoking status: Former Smoker    Quit date: 12/20/1990  . Smokeless tobacco: Not on file  . Alcohol Use: No    OB History    Grav Para Term Preterm Abortions TAB SAB Ect Mult Living                  Review of Systems  Unable to perform ROS: Other    Allergies  Review of patient's allergies indicates no known allergies.  Home Medications   Current Outpatient Rx  Name Route Sig Dispense Refill  . CALCIUM-VITAMIN D PO Oral Take by mouth daily.      Marland Kitchen LETROZOLE 2.5 MG PO TABS Oral Take 2.5 mg by mouth daily.      Marland Kitchen LOSARTAN POTASSIUM 25 MG PO TABS Oral Take 25 mg by mouth daily.      Marland Kitchen VITAMIN D (ERGOCALCIFEROL) 50000 UNITS PO CAPS      .  VITAMIN E 100 UNITS PO CAPS Oral Take 100 Units by mouth daily.      BP 146/88  Pulse 103  Temp(Src) 98.5 F (36.9 C) (Oral)  Resp 24  Ht 5\' 5"  (1.651 m)  Wt 147 lb (66.679 kg)  BMI 24.46 kg/m2  SpO2 95%  Physical Exam  Nursing note and vitals reviewed. Constitutional: She is oriented to person, place, and time. She appears well-developed and well-nourished.       Patient appears frightened, agitated  HENT:  Head: Normocephalic and atraumatic.  Eyes: Conjunctivae and EOM are normal. Pupils are equal, round, and reactive to light.  Neck: Normal range of motion. Neck supple.  Cardiovascular: Normal rate and regular rhythm.   Pulmonary/Chest: Effort normal and breath sounds normal.  Abdominal: Soft. Bowel sounds are normal.  Musculoskeletal: Normal range of motion.  Neurological: She is alert and oriented to person, place, and time.       Patient appears to move all 4 extremities equally with normal strength  Skin: Skin is warm and dry.  Psychiatric:       Flat affect    ED Course  Procedures (including critical care time)  Labs  Reviewed  DIFFERENTIAL - Abnormal; Notable for the following:    Neutrophils Relative 85 (*)    Lymphocytes Relative 9 (*)    All other components within normal limits  COMPREHENSIVE METABOLIC PANEL - Abnormal; Notable for the following:    Glucose, Bld 124 (*)    ALT 44 (*)    All other components within normal limits  URINALYSIS, ROUTINE W REFLEX MICROSCOPIC - Abnormal; Notable for the following:    Color, Urine AMBER (*) BIOCHEMICALS MAY BE AFFECTED BY COLOR   APPearance CLOUDY (*)    Glucose, UA 100 (*)    Ketones, ur >80 (*)    Protein, ur 100 (*)    Leukocytes, UA SMALL (*)    All other components within normal limits  URINE MICROSCOPIC-ADD ON - Abnormal; Notable for the following:    Squamous Epithelial / LPF FEW (*)    Bacteria, UA FEW (*)    Casts HYALINE CASTS (*)    All other components within normal limits  CBC  POCT I-STAT  TROPONIN I   Ct Head Wo Contrast  11/04/2011  *RADIOLOGY REPORT*  Clinical Data: Syncopal episodes.  Left sided numbness.  CT HEAD WITHOUT CONTRAST  Technique:  Contiguous axial images were obtained from the base of the skull through the vertex without contrast.  Comparison: 12/09/2005 MR.  Findings: No intracranial hemorrhage.  Nonspecific white matter type changes.  This limits detection of small acute infarct.  No CT evidence of large acute infarct.  Mild atrophy without hydrocephalus.  No intracranial mass lesion detected on this unenhanced exam.  IMPRESSION: Nonspecific white matter type changes without CT evidence of large acute infarct.  No intracranial hemorrhage.  Mild atrophy.  Original Report Authenticated By: Fuller Canada, M.D.     No diagnosis found.  Date: 11/04/2011  Rate: 64  Rhythm: normal sinus rhythm  QRS Axis: normal  Intervals: normal  ST/T Wave abnormalities: normal  Conduction Disutrbances:none  Narrative Interpretation:   Old EKG Reviewed: none available PVC's   MDM   Uncertain etiology of syncopal spells.  EKG shows normal sinus rhythm with several PVCs.  CT head normal. Hemoglobin normal. Urinalysis 3-6 white cells.  Patient rechecked at 2345:  Feeling much better. Admit       Donnetta Hutching, MD 11/04/11 [phone]

## 2011-11-05 ENCOUNTER — Observation Stay (HOSPITAL_COMMUNITY): Payer: Managed Care, Other (non HMO)

## 2011-11-05 ENCOUNTER — Inpatient Hospital Stay (HOSPITAL_COMMUNITY)
Admit: 2011-11-05 | Discharge: 2011-11-05 | Disposition: A | Discharge status: Home / Self Care | Payer: Managed Care, Other (non HMO) | Attending: Family Medicine | Admitting: Family Medicine

## 2011-11-05 ENCOUNTER — Encounter (HOSPITAL_COMMUNITY): Payer: Self-pay | Admitting: Family Medicine

## 2011-11-05 ENCOUNTER — Other Ambulatory Visit: Payer: Managed Care, Other (non HMO)

## 2011-11-05 DIAGNOSIS — N390 Urinary tract infection, site not specified: Secondary | ICD-10-CM | POA: Diagnosis present

## 2011-11-05 DIAGNOSIS — R55 Syncope and collapse: Secondary | ICD-10-CM

## 2011-11-05 DIAGNOSIS — E860 Dehydration: Secondary | ICD-10-CM | POA: Diagnosis present

## 2011-11-05 DIAGNOSIS — G459 Transient cerebral ischemic attack, unspecified: Secondary | ICD-10-CM

## 2011-11-05 DIAGNOSIS — R531 Weakness: Secondary | ICD-10-CM | POA: Diagnosis present

## 2011-11-05 DIAGNOSIS — R569 Unspecified convulsions: Secondary | ICD-10-CM

## 2011-11-05 DIAGNOSIS — R200 Anesthesia of skin: Secondary | ICD-10-CM | POA: Diagnosis present

## 2011-11-05 LAB — HEMOGLOBIN A1C
Hgb A1c MFr Bld: 5.4 % (ref <5.7)
Mean Plasma Glucose: 108 mg/dL (ref <117)

## 2011-11-05 LAB — RAPID URINE DRUG SCREEN, HOSP PERFORMED
Amphetamines: NOT DETECTED
Barbiturates: NOT DETECTED
Benzodiazepines: NOT DETECTED
Cocaine: NOT DETECTED
Opiates: NOT DETECTED
Tetrahydrocannabinol: NOT DETECTED

## 2011-11-05 LAB — LIPID PANEL
Cholesterol: 161 mg/dL (ref 0–200)
HDL: 75 mg/dL (ref >39)
LDL Cholesterol: 75 mg/dL (ref 0–99)
Total CHOL/HDL Ratio: 2.1 RATIO
Triglycerides: 57 mg/dL (ref <150)
VLDL: 11 mg/dL (ref 0–40)

## 2011-11-05 LAB — CARDIAC PANEL(CRET KIN+CKTOT+MB+TROPI)
CK, MB: 1.2 ng/mL (ref 0.3–4.0)
CK, MB: 1.5 ng/mL (ref 0.3–4.0)
Relative Index: INVALID (ref 0.0–2.5)
Relative Index: INVALID (ref 0.0–2.5)
Total CK: 81 U/L (ref 7–177)
Total CK: 89 U/L (ref 7–177)
Troponin I: <0.3 ng/mL (ref <0.30)
Troponin I: <0.3 ng/mL (ref <0.30)

## 2011-11-05 LAB — GLUCOSE, CAPILLARY
Glucose-Capillary: 116 mg/dL — ABNORMAL HIGH (ref 70–99)
Glucose-Capillary: 109 mg/dL — ABNORMAL HIGH (ref 70–99)

## 2011-11-05 LAB — LACTIC ACID, PLASMA: Lactic Acid, Venous: 1.4 mmol/L (ref 0.5–2.2)

## 2011-11-05 MED ORDER — LEVOFLOXACIN IN D5W 750 MG/150ML IV SOLN
750.0000 mg | Freq: Every day | INTRAVENOUS | Status: DC
  Administered 2011-11-05: 750 mg via INTRAVENOUS
  Filled 2011-11-05: qty 150

## 2011-11-05 MED ORDER — CEFUROXIME AXETIL 500 MG PO TABS: 500.0000 mg | ORAL_TABLET | Freq: Two times a day (BID) | ORAL | Status: AC

## 2011-11-05 MED ORDER — ASPIRIN 325 MG PO TABS
325.0000 mg | ORAL_TABLET | Freq: Every day | ORAL | Status: DC
  Administered 2011-11-05: 325 mg via ORAL
  Filled 2011-11-05: qty 1

## 2011-11-05 MED ORDER — ASPIRIN 325 MG PO TABS: 325.0000 mg | ORAL_TABLET | Freq: Every day | ORAL | Status: DC

## 2011-11-05 MED ORDER — DEXTROSE 5 % IV SOLN
1.0000 g | Freq: Every day | INTRAVENOUS | Status: DC
  Administered 2011-11-05: 1 g via INTRAVENOUS
  Filled 2011-11-05 (×2): qty 10

## 2011-11-05 MED ORDER — SACCHAROMYCES BOULARDII 250 MG PO CAPS
250.0000 mg | ORAL_CAPSULE | Freq: Two times a day (BID) | ORAL | Status: DC
  Administered 2011-11-05: 250 mg via ORAL
  Filled 2011-11-05 (×2): qty 1

## 2011-11-05 MED ORDER — POTASSIUM CHLORIDE IN NACL 20-0.9 MEQ/L-% IV SOLN
INTRAVENOUS | Status: DC
  Administered 2011-11-05: 01:00:00 via INTRAVENOUS
  Filled 2011-11-05 (×4): qty 1000

## 2011-11-05 MED ORDER — LOSARTAN POTASSIUM 25 MG PO TABS
25.0000 mg | ORAL_TABLET | Freq: Every day | ORAL | Status: DC
  Administered 2011-11-05: 25 mg via ORAL
  Filled 2011-11-05: qty 1

## 2011-11-05 MED ORDER — LETROZOLE 2.5 MG PO TABS
2.5000 mg | ORAL_TABLET | Freq: Every day | ORAL | Status: DC
  Administered 2011-11-05: 2.5 mg via ORAL
  Filled 2011-11-05: qty 1

## 2011-11-05 MED ORDER — ENOXAPARIN SODIUM 40 MG/0.4ML ~~LOC~~ SOLN
40.0000 mg | SUBCUTANEOUS | Status: DC
  Administered 2011-11-05: 40 mg via SUBCUTANEOUS
  Filled 2011-11-05: qty 0.4

## 2011-11-05 MED ORDER — ACETAMINOPHEN 325 MG PO TABS: 650.0000 mg | ORAL_TABLET | ORAL | Status: DC | PRN

## 2011-11-05 NOTE — Progress Notes (Signed)
Portable EEG completed

## 2011-11-05 NOTE — Progress Notes (Signed)
Subjective: Patient feeling better today.  No other specific complaints.  Denies any pain.  Objective: Vital signs in last 24 hours: Filed Vitals:   11/05/11 0045 11/05/11 0121 11/05/11 0200 11/05/11 0541  BP:  128/64 142/65 120/69  Pulse:  95 96 64  Temp: 100 F (37.8 C) 99.7 F (37.6 C) 98.3 F (36.8 C) 98.5 F (36.9 C)  TempSrc:  Oral Oral Oral  Resp:  20 18 20   Height:      Weight:      SpO2:   96% 96%   Weight change:   Intake/Output Summary (Last 24 hours) at 11/05/11 1330 Last data filed at 11/05/11 1329  Gross per 24 hour  Intake   1480 ml  Output   1150 ml  Net    330 ml    Physical Exam: General: Awake, Oriented x3, No acute distress. HEENT: EOMI. Neck: Supple CV: S1 and S2 Lungs: Clear to ascultation bilaterally Abdomen: Soft, Nontender, Nondistended, +bowel sounds. Ext: Good pulses. Trace edema.  Lab Results:  [facility] 11/04/11 2207  NA 137  K 3.5  CL 101  CO2 22  GLUCOSE 124*  BUN 15  CREATININE 0.68  CALCIUM 8.8  MG --  PHOS --    Basename 11/04/11 2207  AST 33  ALT 44*  ALKPHOS 62  BILITOT 1.0  PROT 6.2  ALBUMIN 3.6   No results found for this basename: LIPASE:2,AMYLASE:2 in the last 72 hours  Basename 11/04/11 2207  WBC 9.0  NEUTROABS 7.6  HGB 13.9  HCT 41.2  MCV 85.5  PLT 199    Basename 11/05/11 0959 11/05/11 0107  CKTOTAL 89 81  CKMB 1.5 1.2  CKMBINDEX -- --  TROPONINI <0.30 <0.30   No components found with this basename: POCBNP:3 No results found for this basename: DDIMER:2 in the last 72 hours  Basename 11/05/11 0104  HGBA1C 5.4    Basename 11/05/11 0959  CHOL 161  HDL 75  LDLCALC 75  TRIG 57  CHOLHDL 2.1  LDLDIRECT --   No results found for this basename: TSH,T4TOTAL,FREET3,T3FREE,THYROIDAB in the last 72 hours No results found for this basename: VITAMINB12:2,FOLATE:2,FERRITIN:2,TIBC:2,IRON:2,RETICCTPCT:2 in the last 72 hours  Micro Results: No results found for this or any previous visit (from the  past 240 hour(s)).  Studies/Results: Ct Head Wo Contrast  11/04/2011  *RADIOLOGY REPORT*  Clinical Data: Syncopal episodes.  Left sided numbness.  CT HEAD WITHOUT CONTRAST  Technique:  Contiguous axial images were obtained from the base of the skull through the vertex without contrast.  Comparison: 12/09/2005 MR.  Findings: No intracranial hemorrhage.  Nonspecific white matter type changes.  This limits detection of small acute infarct.  No CT evidence of large acute infarct.  Mild atrophy without hydrocephalus.  No intracranial mass lesion detected on this unenhanced exam.  IMPRESSION: Nonspecific white matter type changes without CT evidence of large acute infarct.  No intracranial hemorrhage.  Mild atrophy.  Original Report Authenticated By: Fuller Canada, M.D.   Mr Maxine Glenn Head Wo Contrast  11/05/2011  *RADIOLOGY REPORT*  Clinical Data:  66 year old female with syncope, left side weakness.  Comparison: Head CT 11/04/2011.  Brain MRI 12/09/2005.  MRI HEAD WITHOUT CONTRAST  Technique: Multiplanar, multiecho pulse sequences of the brain and surrounding structures were obtained according to standard protocol without intravenous contrast.  Findings: No restricted diffusion to suggest acute infarction.  No midline shift, mass effect, evidence of mass lesion, ventriculomegaly, extra-axial collection or acute intracranial hemorrhage.  Cervicomedullary junction and pituitary  are within normal limits.  Major intracranial vascular flow voids are stable.  Scattered patchy and confluent white matter T2 and FLAIR hyperintensity is present in a nonspecific pattern.  This has mildly progressed since 2007.  As before, the deep gray matter nuclei, brainstem, and cerebellum are relatively spared.  Negative visualized cervical spine.  Normal bone marrow signal.  Visualized orbit soft tissues are within normal limits.  Minor paranasal sinus mucosal thickening.  Occasional mastoid fluid. Negative visualized nasopharynx.  The other  visualized internal auditory structures are grossly normal.  Negative scalp soft tissues.  IMPRESSION: 1. No acute intracranial abnormality. 2.  Nonspecific cerebral white matter signal changes with mild progression since 2007. 3.  MRA findings are below.  MRA HEAD WITHOUT CONTRAST  Technique: Angiographic images of the Circle of Willis were obtained using MRA technique without  intravenous contrast.  Findings: Antegrade flow in the posterior circulation.  Dominant distal right vertebral artery.  Normal right PICA.  Dominant left AICA.  Normal vertebrobasilar junction and basilar artery.  Normal superior cerebellar arteries and PCA origins.  Posterior communicating arteries are diminutive or absent.  Bilateral PCA branches are within normal limits.  Antegrade flow in both ICA siphons.  Mild ICA irregularity.  No ICA stenosis.  Normal ophthalmic artery origins.  Patent carotid termini.  Dominant right ACA A1 segment.  Normal MCA origins. Normal anterior communicating artery.  Visualized ACA branches are within normal limits.  Visualized right MCA branches are within normal limits.  Mildly irregular left MCA M1 segment without stenosis.  Visualized left MCA branches are within normal limits.  IMPRESSION:  Minimal or mild for age intracranial atherosclerosis, otherwise negative intracranial MRA.  Original Report Authenticated By: Harley Hallmark, M.D.   Mri Brain Without Contrast  11/05/2011  *RADIOLOGY REPORT*  Clinical Data:  66 year old female with syncope, left side weakness.  Comparison: Head CT 11/04/2011.  Brain MRI 12/09/2005.  MRI HEAD WITHOUT CONTRAST  Technique: Multiplanar, multiecho pulse sequences of the brain and surrounding structures were obtained according to standard protocol without intravenous contrast.  Findings: No restricted diffusion to suggest acute infarction.  No midline shift, mass effect, evidence of mass lesion, ventriculomegaly, extra-axial collection or acute intracranial hemorrhage.   Cervicomedullary junction and pituitary are within normal limits.  Major intracranial vascular flow voids are stable.  Scattered patchy and confluent white matter T2 and FLAIR hyperintensity is present in a nonspecific pattern.  This has mildly progressed since 2007.  As before, the deep gray matter nuclei, brainstem, and cerebellum are relatively spared.  Negative visualized cervical spine.  Normal bone marrow signal.  Visualized orbit soft tissues are within normal limits.  Minor paranasal sinus mucosal thickening.  Occasional mastoid fluid. Negative visualized nasopharynx.  The other visualized internal auditory structures are grossly normal.  Negative scalp soft tissues.  IMPRESSION: 1. No acute intracranial abnormality. 2.  Nonspecific cerebral white matter signal changes with mild progression since 2007. 3.  MRA findings are below.  MRA HEAD WITHOUT CONTRAST  Technique: Angiographic images of the Circle of Willis were obtained using MRA technique without  intravenous contrast.  Findings: Antegrade flow in the posterior circulation.  Dominant distal right vertebral artery.  Normal right PICA.  Dominant left AICA.  Normal vertebrobasilar junction and basilar artery.  Normal superior cerebellar arteries and PCA origins.  Posterior communicating arteries are diminutive or absent.  Bilateral PCA branches are within normal limits.  Antegrade flow in both ICA siphons.  Mild ICA irregularity.  No ICA stenosis.  Normal ophthalmic artery origins.  Patent carotid termini.  Dominant right ACA A1 segment.  Normal MCA origins. Normal anterior communicating artery.  Visualized ACA branches are within normal limits.  Visualized right MCA branches are within normal limits.  Mildly irregular left MCA M1 segment without stenosis.  Visualized left MCA branches are within normal limits.  IMPRESSION:  Minimal or mild for age intracranial atherosclerosis, otherwise negative intracranial MRA.  Original Report Authenticated By: Harley Hallmark, M.D.    Medications: I have reviewed the patient's current medications. Scheduled Meds:   . aspirin  325 mg Oral Daily  . cefTRIAXone (ROCEPHIN)  IV  1 g Intravenous QHS  . enoxaparin  40 mg Subcutaneous Q24H  . letrozole  2.5 mg Oral Daily  . losartan  25 mg Oral Daily  . saccharomyces boulardii  250 mg Oral BID  . sodium chloride  500 mL Intravenous Once  . DISCONTD: levofloxacin (LEVAQUIN) IV  750 mg Intravenous QHS   Continuous Infusions:   . 0.9 % NaCl with KCl 20 mEq / L 100 mL/hr at 11/05/11 0112  . DISCONTD: sodium chloride 125 mL/hr at 11/05/11 0001   PRN Meds:.acetaminophen  2-D echocardiogram on 11/05/2011 Study Conclusions Left ventricle: The cavity size was normal. Systolic function was normal. The estimated ejection fraction was in the range of 50% to 55%. Wall motion was normal; there were no regional wall motion abnormalities.  Assessment/Plan: Syncope and collapse Etiology unclear.  Patient was orthostatic on admission suspect patient had dehydration with diarrhea and nausea.  Patient had 2-D echocardiogram with results as indicated above.  Patient had stress echocardiogram on 12/13/2010,, patient had multiple PVCs scattered throughout the study, wall motion was abnormal at rest, however it improved in all segments with stress.  Patient had MRI of the brain with results as indicated above which did not show any acute intracranial abnormality.  Patient had bilateral carotid Dopplers which showed no evidence of hemodynamically significant internal carotid artery stenosis.  Vertebral artery flow is antegrade.  EEG done with results pending.  Patient was evaluated by PT/OT and needs seen.  Urine drug screen negative.  LDL 75, troponin stent x3, hemoglobin A1c 5.4.  Low likelihood that this is TIA given negative findings on the MRI.  Continue aspirin.  Generalized weakness PT and OT evaluated the patient, and no needs seen.  Colon cancer/breast  carcinoma Management as outpatient.  Continue letrozole.  Premature ventricular contractions Noted on tele, otherwise no events noted on telemetry.  Questionable urinary tract infection Patient on ceftriaxone.  Urine analysis on 11/04/2011 was negative for nitrates and small leukocytes had mucus and hyaline casts suggesting of poor sample.  Change to cefuroxime mean for 2 more days to complete an empiric 3 day course.  History of chronic constipation Stable  Prophylaxis SCDs.  Disposition As the patient is no longer orthostatic consider discharging the patient today.  Discussed with patient and husband [phone]) and updated him of the results.   LOS: 1 day  Marja Adderley A, MD 11/05/2011, 1:30 PM

## 2011-11-05 NOTE — Progress Notes (Signed)
  Echocardiogram 2D Echocardiogram has been performed.  Cathie Beams Deneen 11/05/2011, 9:05 AM

## 2011-11-05 NOTE — Evaluation (Signed)
Physical Therapy Evaluation Patient Details Name: Eileen [name] MRN: [id number] DOB: [birth year] Today's Date: 11/05/2011  Problem List:  Patient Active Problem List  Diagnoses  . COLON CANCER  . CARCINOMA, BREAST  . HYPERTENSION, BENIGN  . HEMORRHOIDS  . CONSTIPATION, CHRONIC  . RECTAL BLEEDING  . CHEST PAIN-PRECORDIAL  . ABDOMINAL PAIN-RLQ  . PERSONAL HX COLON CANCER  . PERSONAL HX BREAST CANCER  . Nonspecific abnormal unspecified cardiovascular function study  . PVC's (premature ventricular contractions)  . TIA (transient ischemic attack)  . Syncope and collapse  . Dehydration  . UTI (lower urinary tract infection)  . Generalized weakness  . Numbness in both legs    Past Medical History:  Past Medical History  Diagnosis Date  . CARCINOMA, BREAST   . COLON CANCER   . HEMORRHOIDS   . Hypertension    Past Surgical History:  Past Surgical History  Procedure Date  . Bone marrow transplant 1999  . Total abdominal hysterectomy w/ bilateral salpingoophorectomy 1996  . Hemicolectomy 01/2005    right  . Breast lumpectomy     left breast    PT Assessment/Plan/Recommendation PT Assessment Clinical Impression Statement: Pt presents with diagnosis of syncope and collapse. Pt reports being very active PTA.  Orthostatics: supine-151/83, sit-154/90, stand-138/84. Pt c/o minimal lightheadedness. Will follow during acute stay. Do not anticipate any follow-up at discharge.  PT Recommendation/Assessment: Patient will need skilled PT in the acute care venue PT Problem List: Decreased mobility;Decreased activity tolerance PT Therapy Diagnosis : Difficulty walking PT Plan PT Frequency: Min 3X/week PT Treatment/Interventions: DME instruction;Gait training;Functional mobility training;Therapeutic activities;Patient/family education PT Recommendation Follow Up Recommendations: No PT follow up Equipment Recommended: None recommended by PT PT Goals  Acute Rehab PT Goals PT Goal  Formulation: With patient Time For Goal Achievement: 7 days Pt will go Sit to Stand: Independently PT Goal: Sit to Stand - Progress: Goal set today Pt will Ambulate: >150 feet;Independently PT Goal: Ambulate - Progress: Goal set today  PT Evaluation Precautions/Restrictions  Restrictions Weight Bearing Restrictions: No Prior Functioning  Home Living Lives With: Spouse Receives Help From: Family Type of Home: House Home Layout: One level Home Access: Level entry Bathroom Shower/Tub: Engineer, manufacturing systems: Standard Home Adaptive Equipment: None Prior Function Level of Independence: Independent with basic ADLs;Independent with transfers;Independent with gait;Independent with homemaking with ambulation Driving: Yes Cognition Cognition Arousal/Alertness: Awake/alert Overall Cognitive Status: Appears within functional limits for tasks assessed Orientation Level: Oriented X4 Sensation/Coordination Sensation Light Touch: Appears Intact Coordination Gross Motor Movements are Fluid and Coordinated: Yes Extremity Assessment RUE Assessment RUE Assessment: Within Functional Limits LUE Assessment LUE Assessment: Within Functional Limits RLE Assessment RLE Assessment: Within Functional Limits LLE Assessment LLE Assessment: Within Functional Limits Mobility (including Balance) Bed Mobility Bed Mobility: Yes Supine to Sit: 6: Modified independent (Device/Increase time) Transfers Transfers: Yes Sit to Stand: 5: Supervision Stand to Sit: 5: Supervision Ambulation/Gait Ambulation/Gait: Yes Ambulation/Gait Assistance Details (indicate cue type and reason): Min-guard assist. Slightly unsteady Ambulation Distance (Feet): 150 Feet Assistive device: None Gait Pattern: Step-through pattern  Posture/Postural Control Posture/Postural Control: No significant limitations Exercise    End of Session PT - End of Session Equipment Utilized During Treatment: Gait belt Activity  Tolerance: Patient tolerated treatment well Patient left: in chair;with call bell in reach General Behavior During Session: [facility] for tasks performed Cognition: [facility] for tasks performed  Rebeca Alert [facility] 11/05/2011, 12:06 PM [phone]

## 2011-11-05 NOTE — Procedures (Signed)
EEG NUMBER:  This routine EEG was requested in this 66 year old female who was admitted with a TIA and multiple syncopal episodes.  She has a history of breast cancer and colon cancer as well as bone marrow transplant. She also currently has a urinary tract infection.  MEDICATIONS:  Include Levaquin, Cozaar, Lovenox, aspirin, and ceftriaxone.  The EEG was done with the patient awake.  During periods of maximal wakefulness, background activities were composed of low-amplitude beta activities with intermixed low-to-very low-amplitude alpha activities. A posterior dominant rhythm of 8-9 cycles per second was seen.  At times, this was slightly higher in amplitude over the left hemisphere.  Photic stimulation produced symmetric driving response. Hyperventilation for 3 minutes with good effort did not markedly change the tracing.  The patient did not sleep.  CLINICAL INTERPRETATION:  This routine EEG done with the patient awake is normal.  The slight amplitude asymmetry in the alpha rhythm was thought to be within normal limits.          ______________________________ Denton Meek, MD    NF:AOZH D:  11/05/2011 22:09:44  T:  11/05/2011 22:30:35  Job #:  [id number]

## 2011-11-05 NOTE — Evaluation (Signed)
Occupational Therapy Evaluation Patient Details Name: Eileen [name] MRN: [id number] DOB: March 11, 1946 Today's Date: 11/05/2011  Problem List:  Patient Active Problem List  Diagnoses  . COLON CANCER  . CARCINOMA, BREAST  . HYPERTENSION, BENIGN  . HEMORRHOIDS  . CONSTIPATION, CHRONIC  . RECTAL BLEEDING  . CHEST PAIN-PRECORDIAL  . ABDOMINAL PAIN-RLQ  . PERSONAL HX COLON CANCER  . PERSONAL HX BREAST CANCER  . Nonspecific abnormal unspecified cardiovascular function study  . PVC's (premature ventricular contractions)  . TIA (transient ischemic attack)  . Syncope and collapse  . Dehydration  . UTI (lower urinary tract infection)  . Generalized weakness  . Numbness in both legs    Past Medical History:  Past Medical History  Diagnosis Date  . CARCINOMA, BREAST   . COLON CANCER   . HEMORRHOIDS   . Hypertension    Past Surgical History:  Past Surgical History  Procedure Date  . Bone marrow transplant 1999  . Total abdominal hysterectomy w/ bilateral salpingoophorectomy 1996  . Hemicolectomy 01/2005    right  . Breast lumpectomy     left breast    OT Assessment/Plan/Recommendation OT Assessment Clinical Impression Statement: Pt is mod I/supervision for all ADLs No f/u OT needed. OT Recommendation/Assessment: Patient does not need any further OT services OT Recommendation Follow Up Recommendations: No OT follow up Equipment Recommended: None recommended by OT OT Goals    OT Evaluation Precautions/Restrictions  Restrictions Weight Bearing Restrictions: No Prior Functioning Home Living Lives With: Spouse Receives Help From: Family Type of Home: House Home Layout: One level Home Access: Level entry Bathroom Shower/Tub: Engineer, manufacturing systems: Standard Home Adaptive Equipment: None Prior Function Level of Independence: Independent with basic ADLs;Independent with transfers;Independent with gait;Independent with homemaking with ambulation Driving:  Yes ADL ADL Grooming: Performed;Modified independent Where Assessed - Grooming: Standing at sink Upper Body Bathing: Simulated;Modified independent Where Assessed - Upper Body Bathing: Standing at sink Lower Body Bathing: Performed;Supervision/safety Where Assessed - Lower Body Bathing: Sit to stand from bed Upper Body Dressing: Performed;Modified independent Where Assessed - Upper Body Dressing: Standing Lower Body Dressing: Performed;Supervision/safety Where Assessed - Lower Body Dressing: Lean right and/or left;Sitting, bed;Unsupported Toilet Transfer: Performed;Modified independent Toilet Transfer Method: Proofreader: Regular height toilet Toileting - Clothing Manipulation: Performed;Modified independent Where Assessed - Toileting Clothing Manipulation: Sit to stand from 3-in-1 or toilet Toileting - Hygiene: Performed;Modified independent Where Assessed - Toileting Hygiene: Sit to stand from 3-in-1 or toilet Tub/Shower Transfer: Not assessed Tub/Shower Transfer Method: Not assessed Vision/Perception  Vision - History Patient Visual Report: No change from baseline Cognition Cognition Arousal/Alertness: Awake/alert Overall Cognitive Status: Appears within functional limits for tasks assessed Orientation Level: Oriented X4 Sensation/Coordination   Extremity Assessment RUE Assessment RUE Assessment: Within Functional Limits LUE Assessment LUE Assessment: Within Functional Limits Mobility  Bed Mobility Bed Mobility: Yes Supine to Sit: 6: Modified independent (Device/Increase time) Transfers Transfers: Yes Sit to Stand: 5: Supervision;From bed;From toilet;With upper extremity assist Stand to Sit: 5: Supervision;With upper extremity assist;To chair/3-in-1;To toilet Exercises   End of Session OT - End of Session Equipment Utilized During Treatment: Gait belt Activity Tolerance: Patient tolerated treatment well Patient left: in  chair General Behavior During Session: [facility] for tasks performed Cognition: [facility] for tasks performed   Shelley Pooley A OTR/L [phone] 11/05/2011, 11:39 AM

## 2011-11-05 NOTE — H&P (Signed)
History and Physical Examination  Date: 11/05/2011  Patient name: Eileen [name] Medical record number: [id number] Date of birth: [birth year] Age: 66 y.o. Gender: female PCP: [name], MD, MD  Attending physician: Cristal Ford, MD  Chief Complaint:  Chief Complaint  Patient presents with  . Loss of Consciousness     History of Present Illness: Eileen [name] is an 67 y.o. female who is a survivor of breast cancer and bone cancer and status post having a bone marrow transplant in 1999 was brought to the emergency department by her husband this evening after the patient experienced multiple syncopal episodes at home.  The first syncopal episode occurred earlier this morning when the patient got up from the kitchen table and ended up passing out on the floor.  She was out for a few seconds and her husband found her on the floor.  She said that she was feeling nauseated at the kitchen table and got up to go to the bathroom and passed out.  The patient reports that she did not have a good night she did not sleep well and she had been feeling nauseated.  The second episode occurred after she had been lying in the bed most of the day resting.  She got up to sit up on the bed and she passed out again.  Her husband was present and he describes a situation where her eyes were open but crossed.  He got her up and tried to sit her in a chair and she passed out again.  The patient went to the bathroom and had some nausea and had some loose stools.  On the way to the emergency department the patient was slightly confused and had some labored breathing according to what the husband describes.  She also read poor status she had a cold sweat and experienced numbness in the hands and legs on both occasions that she experienced these episodes.  The patient reports that she had what she calls a mini stroke approximately 8 years ago.  She had a cardiac stress echo in 2011 that was reviewed and negative for reversible  ischemia but she did have some diminished ejection fraction of approximately 35% at that time.  This was during rest.  During maximal activity her EF was 55%.  The patient has treated hypertension.  She does not have a history of myocardial infarction.  In the emergency department the patient was evaluated and given IV fluids and she started to feel better.  She developed a low-grade temperature of 100.  She had an abnormal urinalysis positive for ketones protein and white blood cells.  The patient had recently been seen by Dr. [name] for long-term cancer followup.  The patient denies cough, fever, shortness of breath, abdominal pain and visual changes.  The patient denies having any epilepsy history.  A hospital admission was requested for further evaluation and management.  Past Medical History Past Medical History  Diagnosis Date  . CARCINOMA, BREAST   . COLON CANCER   . HEMORRHOIDS   . Hypertension     Past Surgical History Past Surgical History  Procedure Date  . Bone marrow transplant 1999  . Total abdominal hysterectomy w/ bilateral salpingoophorectomy 1996  . Hemicolectomy 01/2005    right  . Breast lumpectomy     left breast    Home Meds: Prior to Admission medications   Medication Sig Start Date End Date Taking? Authorizing Provider  CALCIUM-VITAMIN D PO Take by mouth daily.  Yes Historical Provider, MD  letrozole (FEMARA) 2.5 MG tablet Take 2.5 mg by mouth daily.     Yes Historical Provider, MD  losartan (COZAAR) 25 MG tablet Take 25 mg by mouth daily.     Yes Historical Provider, MD  Vitamin D, Ergocalciferol, (DRISDOL) 50000 UNITS CAPS  08/28/11  Yes Historical Provider, MD  vitamin E 100 UNIT capsule Take 100 Units by mouth daily.   Yes Historical Provider, MD    Allergies: Review of patient's allergies indicates no known allergies.  Social History:  History   Social History  . Marital Status: Married    Spouse Name: [name]    Number of Children: [name]  . Years of  Education: [name]   Occupational History  . Not on file.   Social History Main Topics  . Smoking status: Former Smoker    Quit date: 12/20/1990  . Smokeless tobacco: Not on file  . Alcohol Use: No  . Drug Use: No  . Sexually Active: Not on file   Other Topics Concern  . Not on file   Social History Narrative  . No narrative on file   Family History:  Family History  Problem Relation Age of Onset  . Uterine cancer Mother   . Heart attack Father   . Colon cancer Neg Hx   . Esophageal cancer Neg Hx   . Stomach cancer Neg Hx   . Rectal cancer Neg Hx     Review of Systems: Pertinent items are noted in HPI. All other systems reviewed and reported as negative.   Physical Exam: Blood pressure 130/68, pulse 77, temperature 100 F (37.8 C), temperature source Oral, resp. rate 20, height 5\' 5"  (1.651 m), weight 66.679 kg (147 lb), SpO2 100.00%. General appearance: alert, cooperative, appears stated age and no distress Head: Normocephalic, without obvious abnormality, atraumatic Eyes: negative Nose: Nares normal. Septum midline. Mucosa normal. No drainage or sinus tenderness., no discharge Throat: Dry mucous membranes Neck: no adenopathy, no carotid bruit, no JVD, supple, symmetrical, trachea midline and thyroid not enlarged, symmetric, no tenderness/mass/nodules Lungs: clear to auscultation bilaterally and normal percussion bilaterally Heart: regular rate and rhythm, S1, S2 normal, no murmur, click, rub or gallop Abdomen: soft, non-tender; bowel sounds normal; no masses,  no organomegaly Extremities: extremities normal, atraumatic, no cyanosis or edema Pulses: 2+ and symmetric Skin: Skin color, texture, turgor normal. No rashes or lesions Neurologic: Alert and oriented X 3, normal strength and tone. Normal symmetric reflexes. Normal coordination and gait  Lab  And Imaging results:  Results for orders placed during the hospital encounter of 11/04/11 (from the past 24 hour(s))    URINALYSIS, ROUTINE W REFLEX MICROSCOPIC     Status: Abnormal   Collection Time   11/04/11  9:49 PM      Component Value Range   Color, Urine AMBER (*) YELLOW    APPearance CLOUDY (*) CLEAR    Specific Gravity, Urine 1.018  1.005 - 1.030    pH 7.5  5.0 - 8.0    Glucose, UA 100 (*) NEGATIVE (mg/dL)   Hgb urine dipstick NEGATIVE  NEGATIVE    Bilirubin Urine NEGATIVE  NEGATIVE    Ketones, ur >80 (*) NEGATIVE (mg/dL)   Protein, ur 914 (*) NEGATIVE (mg/dL)   Urobilinogen, UA 1.0  0.0 - 1.0 (mg/dL)   Nitrite NEGATIVE  NEGATIVE    Leukocytes, UA SMALL (*) NEGATIVE   URINE MICROSCOPIC-ADD ON     Status: Abnormal   Collection Time   11/04/11  9:49 PM      Component Value Range   Squamous Epithelial / LPF FEW (*) RARE    WBC, UA 3-6  <3 (WBC/hpf)   RBC / HPF 0-2  <3 (RBC/hpf)   Bacteria, UA FEW (*) RARE    Casts HYALINE CASTS (*) NEGATIVE    Urine-Other MUCOUS PRESENT    CBC     Status: Normal   Collection Time   11/04/11 10:07 PM      Component Value Range   WBC 9.0  4.0 - 10.5 (K/uL)   RBC 4.82  3.87 - 5.11 (MIL/uL)   Hemoglobin 13.9  12.0 - 15.0 (g/dL)   HCT 16.1  09.6 - 04.5 (%)   MCV 85.5  78.0 - 100.0 (fL)   MCH 28.8  26.0 - 34.0 (pg)   MCHC 33.7  30.0 - 36.0 (g/dL)   RDW 40.9  81.1 - 91.4 (%)   Platelets 199  150 - 400 (K/uL)  DIFFERENTIAL     Status: Abnormal   Collection Time   11/04/11 10:07 PM      Component Value Range   Neutrophils Relative 85 (*) 43 - 77 (%)   Neutro Abs 7.6  1.7 - 7.7 (K/uL)   Lymphocytes Relative 9 (*) 12 - 46 (%)   Lymphs Abs 0.8  0.7 - 4.0 (K/uL)   Monocytes Relative 7  3 - 12 (%)   Monocytes Absolute 0.6  0.1 - 1.0 (K/uL)   Eosinophils Relative 0  0 - 5 (%)   Eosinophils Absolute 0.0  0.0 - 0.7 (K/uL)   Basophils Relative 0  0 - 1 (%)   Basophils Absolute 0.0  0.0 - 0.1 (K/uL)  COMPREHENSIVE METABOLIC PANEL     Status: Abnormal   Collection Time   11/04/11 10:07 PM      Component Value Range   Sodium 137  135 - 145 (mEq/L)   Potassium 3.5   3.5 - 5.1 (mEq/L)   Chloride 101  96 - 112 (mEq/L)   CO2 22  19 - 32 (mEq/L)   Glucose, Bld 124 (*) 70 - 99 (mg/dL)   BUN 15  6 - 23 (mg/dL)   Creatinine, Ser 7.82  0.50 - 1.10 (mg/dL)   Calcium 8.8  8.4 - 95.6 (mg/dL)   Total Protein 6.2  6.0 - 8.3 (g/dL)   Albumin 3.6  3.5 - 5.2 (g/dL)   AST 33  0 - 37 (U/L)   ALT 44 (*) 0 - 35 (U/L)   Alkaline Phosphatase 62  39 - 117 (U/L)   Total Bilirubin 1.0  0.3 - 1.2 (mg/dL)   GFR calc non Af Amer >90  >90 (mL/min)   GFR calc Af Amer >90  >90 (mL/min)  POCT I-STAT TROPONIN I     Status: Normal   Collection Time   11/04/11 10:15 PM      Component Value Range   Troponin i, poc 0.00  0.00 - 0.08 (ng/mL)   Comment 3            EKG Results:  Orders placed during the hospital encounter of 11/04/11  . ED EKG  . ED EKG     Impression    Syncope and collapse   TIA (transient ischemic attack)   Dehydration   Numbness in both legs   UTI (lower urinary tract infection)   Generalized weakness   COLON CANCER   CARCINOMA, BREAST   HYPERTENSION, BENIGN   CONSTIPATION, CHRONIC   PVC's (  premature ventricular contractions)    Plan  Although initially, the patient presented with syncope and collapse, after listening to the history of what actually happened to the patient today, I am concerned that she may have had a TIA event.  I like to admit her to telemetry for close monitoring.  I like to check an echocardiogram, carotid Doppler studies, A1c, lipid panel, EEG, replace IV fluids, monitor electrolytes, check blood cultures, check thyroid function studies, I'd like to get an MRI/MRA of the brain without contrast, consult PT and OT, monitor blood pressure closely, I've ordered for her orthostatic vital signs to be tested.  Consider neurology consultation if symptoms persist.   I'd like to culture the abnormal urine and empirically treat the infection even though she's not currently having many symptoms, she is dehydrated and has a low grade temperature.   Please see orders and follow hospital course.  Standley Dakins MD [facility] [place], [place] [phone] 11/05/2011, 12:07 AM

## 2011-11-05 NOTE — Discharge Summary (Signed)
Discharge Summary  Eileen [name] MR#: [id number]  DOB:09-30-1945  Date of Admission: 11/04/2011 Date of Discharge: 11/05/2011  Patient's PCP: [name], MD, MD  Attending Physician:Kylie Gros A  Consults: None  Discharge Diagnoses: Principal Problem:  *Syncope and collapse Active Problems:  COLON CANCER  CARCINOMA, BREAST  HYPERTENSION, BENIGN  CONSTIPATION, CHRONIC  PVC's (premature ventricular contractions)  TIA (transient ischemic attack)  Dehydration  UTI (lower urinary tract infection)  Generalized weakness  Numbness in both legs   Brief Admitting History and Physical Eileen [name] is an 66 y.o. female who is a survivor of breast cancer currently on letrozole, and bone cancer and status post having a bone marrow transplant in 1999 was brought to the emergency department by her husband this evening after the patient experienced multiple syncopal episodes at home.   Discharge Medications Medication List  As of 11/05/2011  1:49 PM   STOP taking these medications         folic acid 400 MCG tablet         TAKE these medications         aspirin 325 MG tablet   Take 1 tablet (325 mg total) by mouth daily.      CALCIUM-VITAMIN D PO   Take by mouth daily.      cefUROXime 500 MG tablet   Commonly known as: CEFTIN   Take 1 tablet (500 mg total) by mouth 2 (two) times daily.      letrozole 2.5 MG tablet   Commonly known as: FEMARA   Take 2.5 mg by mouth daily.      losartan 25 MG tablet   Commonly known as: COZAAR   Take 25 mg by mouth daily.      Vitamin D (Ergocalciferol) 50000 UNITS Caps   Commonly known as: DRISDOL      vitamin E 100 UNIT capsule   Take 100 Units by mouth daily.            Hospital Course: Syncope and collapse Etiology unclear.  Patient was orthostatic on admission, suspect patient had dehydration with diarrhea and nausea.  Patient had 2-D echocardiogram on 11/05/2011 with results as indicated below with ejection fraction of  55%.  Patient had stress echocardiogram on 12/13/2010, patient had multiple PVCs scattered throughout the study, wall motion was abnormal at rest, however it improved in all segments with stress.  Patient had MRI of the brain on 11/05/2011 with results as indicated below which did not show any acute intracranial abnormality.  Patient had bilateral carotid Dopplers which showed no evidence of hemodynamically significant internal carotid artery stenosis, vertebral artery flow is antegrade.  EEG done on 11/05/2011, with preliminary results per Dr. Modesto Charon was a normal EEG.  Patient was evaluated by PT/OT and no needs seen.  Urine drug screen negative.  LDL 75, troponin stent x3, hemoglobin A1c 5.4.  Low likelihood that this is TIA given negative findings on the MRI.  Continue aspirin. Orthostatics were checked prior to discharge and patient was not orthostatic.  Patient at the time of discharge was instructed to adequately hydrate herself at home.  Generalized weakness PT and OT evaluated the patient, and no needs seen.  Colon cancer/breast carcinoma Management as outpatient.  Continue letrozole for breast cancer.  Premature ventricular contractions Noted on tele, otherwise no events noted on telemetry.  Questionable urinary tract infection Patient on ceftriaxone.  Urine analysis on 11/04/2011 was negative for nitrates and small leukocytes had mucus and hyaline casts suggesting of poor sample.  Change to cefuroxime mean for 2 more days to complete an empiric 3 day course.  History of chronic constipation Stable  Gastroenteritis Patient reported diarrhea prior to coming to the hospital.  Suspect patient had viral gastroenteritis which has resolved, no further episodes during the course of hospital stay.  Disposition Discussed with patient and husband [phone]) and updated them off all the results. Instructed the patient to follow up with her primary care physician in 1 week.    Day of Discharge BP  120/69  Pulse 64  Temp(Src) 98.5 F (36.9 C) (Oral)  Resp 20  Ht 5\' 5"  (1.651 m)  Wt 66.679 kg (147 lb)  BMI 24.46 kg/m2  SpO2 96%  Results for orders placed during the hospital encounter of 11/04/11 (from the past 48 hour(s))  URINALYSIS, ROUTINE W REFLEX MICROSCOPIC     Status: Abnormal   Collection Time   11/04/11  9:49 PM      Component Value Range Comment   Color, Urine AMBER (*) YELLOW  BIOCHEMICALS MAY BE AFFECTED BY COLOR   APPearance CLOUDY (*) CLEAR     Specific Gravity, Urine 1.018  1.005 - 1.030     pH 7.5  5.0 - 8.0     Glucose, UA 100 (*) NEGATIVE (mg/dL)    Hgb urine dipstick NEGATIVE  NEGATIVE     Bilirubin Urine NEGATIVE  NEGATIVE     Ketones, ur >80 (*) NEGATIVE (mg/dL)    Protein, ur 454 (*) NEGATIVE (mg/dL)    Urobilinogen, UA 1.0  0.0 - 1.0 (mg/dL)    Nitrite NEGATIVE  NEGATIVE     Leukocytes, UA SMALL (*) NEGATIVE    URINE MICROSCOPIC-ADD ON     Status: Abnormal   Collection Time   11/04/11  9:49 PM      Component Value Range Comment   Squamous Epithelial / LPF FEW (*) RARE     WBC, UA 3-6  <3 (WBC/hpf)    RBC / HPF 0-2  <3 (RBC/hpf)    Bacteria, UA FEW (*) RARE     Casts HYALINE CASTS (*) NEGATIVE     Urine-Other MUCOUS PRESENT     CBC     Status: Normal   Collection Time   11/04/11 10:07 PM      Component Value Range Comment   WBC 9.0  4.0 - 10.5 (K/uL)    RBC 4.82  3.87 - 5.11 (MIL/uL)    Hemoglobin 13.9  12.0 - 15.0 (g/dL)    HCT 09.8  11.9 - 14.7 (%)    MCV 85.5  78.0 - 100.0 (fL)    MCH 28.8  26.0 - 34.0 (pg)    MCHC 33.7  30.0 - 36.0 (g/dL)    RDW 82.9  56.2 - 13.0 (%)    Platelets 199  150 - 400 (K/uL)   DIFFERENTIAL     Status: Abnormal   Collection Time   11/04/11 10:07 PM      Component Value Range Comment   Neutrophils Relative 85 (*) 43 - 77 (%)    Neutro Abs 7.6  1.7 - 7.7 (K/uL)    Lymphocytes Relative 9 (*) 12 - 46 (%)    Lymphs Abs 0.8  0.7 - 4.0 (K/uL)    Monocytes Relative 7  3 - 12 (%)    Monocytes Absolute 0.6  0.1 - 1.0  (K/uL)    Eosinophils Relative 0  0 - 5 (%)    Eosinophils Absolute 0.0  0.0 - 0.7 (K/uL)  Basophils Relative 0  0 - 1 (%)    Basophils Absolute 0.0  0.0 - 0.1 (K/uL)   COMPREHENSIVE METABOLIC PANEL     Status: Abnormal   Collection Time   11/04/11 10:07 PM      Component Value Range Comment   Sodium 137  135 - 145 (mEq/L)    Potassium 3.5  3.5 - 5.1 (mEq/L)    Chloride 101  96 - 112 (mEq/L)    CO2 22  19 - 32 (mEq/L)    Glucose, Bld 124 (*) 70 - 99 (mg/dL)    BUN 15  6 - 23 (mg/dL)    Creatinine, Ser 9.14  0.50 - 1.10 (mg/dL)    Calcium 8.8  8.4 - 10.5 (mg/dL)    Total Protein 6.2  6.0 - 8.3 (g/dL)    Albumin 3.6  3.5 - 5.2 (g/dL)    AST 33  0 - 37 (U/L)    ALT 44 (*) 0 - 35 (U/L)    Alkaline Phosphatase 62  39 - 117 (U/L)    Total Bilirubin 1.0  0.3 - 1.2 (mg/dL)    GFR calc non Af Amer >90  >90 (mL/min)    GFR calc Af Amer >90  >90 (mL/min)   POCT I-STAT TROPONIN I     Status: Normal   Collection Time   11/04/11 10:15 PM      Component Value Range Comment   Troponin i, poc 0.00  0.00 - 0.08 (ng/mL)    Comment 3            HEMOGLOBIN A1C     Status: Normal   Collection Time   11/05/11  1:04 AM      Component Value Range Comment   Hemoglobin A1C 5.4  <5.7 (%)    Mean Plasma Glucose 108  <117 (mg/dL)   LACTIC ACID, PLASMA     Status: Normal   Collection Time   11/05/11  1:04 AM      Component Value Range Comment   Lactic Acid, Venous 1.4  0.5 - 2.2 (mmol/L)   CARDIAC PANEL(CRET KIN+CKTOT+MB+TROPI)     Status: Normal   Collection Time   11/05/11  1:07 AM      Component Value Range Comment   Total CK 81  7 - 177 (U/L)    CK, MB 1.2  0.3 - 4.0 (ng/mL)    Troponin I <0.30  <0.30 (ng/mL)    Relative Index RELATIVE INDEX IS INVALID  0.0 - 2.5    URINE RAPID DRUG SCREEN (HOSP PERFORMED)     Status: Normal   Collection Time   11/05/11  3:37 AM      Component Value Range Comment   Opiates NONE DETECTED  NONE DETECTED     Cocaine NONE DETECTED  NONE DETECTED     Benzodiazepines  NONE DETECTED  NONE DETECTED     Amphetamines NONE DETECTED  NONE DETECTED     Tetrahydrocannabinol NONE DETECTED  NONE DETECTED     Barbiturates NONE DETECTED  NONE DETECTED    GLUCOSE, CAPILLARY     Status: Abnormal   Collection Time   11/05/11  8:00 AM      Component Value Range Comment   Glucose-Capillary 116 (*) 70 - 99 (mg/dL)   CARDIAC PANEL(CRET KIN+CKTOT+MB+TROPI)     Status: Normal   Collection Time   11/05/11  9:59 AM      Component Value Range Comment   Total CK 89  7 -  177 (U/L)    CK, MB 1.5  0.3 - 4.0 (ng/mL)    Troponin I <0.30  <0.30 (ng/mL)    Relative Index RELATIVE INDEX IS INVALID  0.0 - 2.5    LIPID PANEL     Status: Normal   Collection Time   11/05/11  9:59 AM      Component Value Range Comment   Cholesterol 161  0 - 200 (mg/dL)    Triglycerides 57  <161 (mg/dL)    HDL 75  >09 (mg/dL)    Total CHOL/HDL Ratio 2.1      VLDL 11  0 - 40 (mg/dL)    LDL Cholesterol 75  0 - 99 (mg/dL)   GLUCOSE, CAPILLARY     Status: Abnormal   Collection Time   11/05/11 11:26 AM      Component Value Range Comment   Glucose-Capillary 109 (*) 70 - 99 (mg/dL)     Ct Head Wo Contrast  11/04/2011  *RADIOLOGY REPORT*  Clinical Data: Syncopal episodes.  Left sided numbness.  CT HEAD WITHOUT CONTRAST  Technique:  Contiguous axial images were obtained from the base of the skull through the vertex without contrast.  Comparison: 12/09/2005 MR.  Findings: No intracranial hemorrhage.  Nonspecific white matter type changes.  This limits detection of small acute infarct.  No CT evidence of large acute infarct.  Mild atrophy without hydrocephalus.  No intracranial mass lesion detected on this unenhanced exam.  IMPRESSION: Nonspecific white matter type changes without CT evidence of large acute infarct.  No intracranial hemorrhage.  Mild atrophy.  Original Report Authenticated By: Fuller Canada, M.D.   Mr Maxine Glenn Head Wo Contrast  11/05/2011  *RADIOLOGY REPORT*  Clinical Data:  66 year old female with  syncope, left side weakness.  Comparison: Head CT 11/04/2011.  Brain MRI 12/09/2005.  MRI HEAD WITHOUT CONTRAST  Technique: Multiplanar, multiecho pulse sequences of the brain and surrounding structures were obtained according to standard protocol without intravenous contrast.  Findings: No restricted diffusion to suggest acute infarction.  No midline shift, mass effect, evidence of mass lesion, ventriculomegaly, extra-axial collection or acute intracranial hemorrhage.  Cervicomedullary junction and pituitary are within normal limits.  Major intracranial vascular flow voids are stable.  Scattered patchy and confluent white matter T2 and FLAIR hyperintensity is present in a nonspecific pattern.  This has mildly progressed since 2007.  As before, the deep gray matter nuclei, brainstem, and cerebellum are relatively spared.  Negative visualized cervical spine.  Normal bone marrow signal.  Visualized orbit soft tissues are within normal limits.  Minor paranasal sinus mucosal thickening.  Occasional mastoid fluid. Negative visualized nasopharynx.  The other visualized internal auditory structures are grossly normal.  Negative scalp soft tissues.  IMPRESSION: 1. No acute intracranial abnormality. 2.  Nonspecific cerebral white matter signal changes with mild progression since 2007. 3.  MRA findings are below.  MRA HEAD WITHOUT CONTRAST  Technique: Angiographic images of the Circle of Willis were obtained using MRA technique without  intravenous contrast.  Findings: Antegrade flow in the posterior circulation.  Dominant distal right vertebral artery.  Normal right PICA.  Dominant left AICA.  Normal vertebrobasilar junction and basilar artery.  Normal superior cerebellar arteries and PCA origins.  Posterior communicating arteries are diminutive or absent.  Bilateral PCA branches are within normal limits.  Antegrade flow in both ICA siphons.  Mild ICA irregularity.  No ICA stenosis.  Normal ophthalmic artery origins.  Patent  carotid termini.  Dominant right ACA A1 segment.  Normal MCA origins. Normal anterior communicating artery.  Visualized ACA branches are within normal limits.  Visualized right MCA branches are within normal limits.  Mildly irregular left MCA M1 segment without stenosis.  Visualized left MCA branches are within normal limits.  IMPRESSION:  Minimal or mild for age intracranial atherosclerosis, otherwise negative intracranial MRA.  Original Report Authenticated By: Harley Hallmark, M.D.   Mri Brain Without Contrast  11/05/2011  *RADIOLOGY REPORT*  Clinical Data:  66 year old female with syncope, left side weakness.  Comparison: Head CT 11/04/2011.  Brain MRI 12/09/2005.  MRI HEAD WITHOUT CONTRAST  Technique: Multiplanar, multiecho pulse sequences of the brain and surrounding structures were obtained according to standard protocol without intravenous contrast.  Findings: No restricted diffusion to suggest acute infarction.  No midline shift, mass effect, evidence of mass lesion, ventriculomegaly, extra-axial collection or acute intracranial hemorrhage.  Cervicomedullary junction and pituitary are within normal limits.  Major intracranial vascular flow voids are stable.  Scattered patchy and confluent white matter T2 and FLAIR hyperintensity is present in a nonspecific pattern.  This has mildly progressed since 2007.  As before, the deep gray matter nuclei, brainstem, and cerebellum are relatively spared.  Negative visualized cervical spine.  Normal bone marrow signal.  Visualized orbit soft tissues are within normal limits.  Minor paranasal sinus mucosal thickening.  Occasional mastoid fluid. Negative visualized nasopharynx.  The other visualized internal auditory structures are grossly normal.  Negative scalp soft tissues.  IMPRESSION: 1. No acute intracranial abnormality. 2.  Nonspecific cerebral white matter signal changes with mild progression since 2007. 3.  MRA findings are below.  MRA HEAD WITHOUT CONTRAST   Technique: Angiographic images of the Circle of Willis were obtained using MRA technique without  intravenous contrast.  Findings: Antegrade flow in the posterior circulation.  Dominant distal right vertebral artery.  Normal right PICA.  Dominant left AICA.  Normal vertebrobasilar junction and basilar artery.  Normal superior cerebellar arteries and PCA origins.  Posterior communicating arteries are diminutive or absent.  Bilateral PCA branches are within normal limits.  Antegrade flow in both ICA siphons.  Mild ICA irregularity.  No ICA stenosis.  Normal ophthalmic artery origins.  Patent carotid termini.  Dominant right ACA A1 segment.  Normal MCA origins. Normal anterior communicating artery.  Visualized ACA branches are within normal limits.  Visualized right MCA branches are within normal limits.  Mildly irregular left MCA M1 segment without stenosis.  Visualized left MCA branches are within normal limits.  IMPRESSION:  Minimal or mild for age intracranial atherosclerosis, otherwise negative intracranial MRA.  Original Report Authenticated By: Harley Hallmark, M.D.    2-D echocardiogram on 11/05/2011 Study Conclusions Left ventricle: The cavity size was normal. Systolic function was normal. The estimated ejection fraction was in the range of 50% to 55%. Wall motion was normal; there were no regional wall motion abnormalities.   Disposition: Home, no PT or OT needs.  Diet: Heart healthy diet  Activity: Resume as tolerated.   Follow-up Appts: Discharge Orders    Future Orders Please Complete By Expires   Diet - low sodium heart healthy      Increase activity slowly      Discharge instructions      Comments:   Followup with [name], MD (PCP) in 1 week. Drink fluids to hydrate yourself.      TESTS THAT NEED FOLLOW-UP None.  Time spent on discharge, talking to the patient, and coordinating care: 35 mins.   Signed: Eyonna Sandstrom A,  MD 11/05/2011, 1:49 PM

## 2011-11-05 NOTE — Progress Notes (Signed)
11/04/08 0030- Pt came from ED with IV Levaquin infusing; noted pt had red streaks above IV site and pt c/o that it was painful and irritated. IV was not infiltrated; stopped IV Levaquin and notified Pharmacy and on-call MD. Antibiotic orders changed. Pt's arm returned to "normal" color after 5-10 minutes of med being stopped; noted Levaquin as allergy for pt.  ALaughlin, RN

## 2011-11-05 NOTE — Progress Notes (Signed)
VASCULAR LAB PRELIMINARY  PRELIMINARY  PRELIMINARY  PRELIMINARY  Carotid duplex  completed.    Preliminary report:  Bilateral:  No evidence of hemodynamically significant internal carotid artery stenosis.   Vertebral artery flow is antegrade.      Terance Hart, RVT 11/05/2011, 9:41 AM

## 2011-11-11 LAB — CULTURE, BLOOD (ROUTINE X 2)
Culture  Setup Time: 201304090325
Culture  Setup Time: 201304090325
Culture: NO GROWTH
Culture: NO GROWTH

## 2011-11-12 ENCOUNTER — Ambulatory Visit
Admission: RE | Admit: 2011-11-12 | Discharge: 2011-11-12 | Disposition: A | Discharge status: Home / Self Care | Payer: Managed Care, Other (non HMO) | Source: Ambulatory Visit | Attending: General Surgery | Admitting: General Surgery

## 2011-11-18 ENCOUNTER — Telehealth (INDEPENDENT_AMBULATORY_CARE_PROVIDER_SITE_OTHER): Payer: Self-pay

## 2011-11-18 ENCOUNTER — Other Ambulatory Visit (INDEPENDENT_AMBULATORY_CARE_PROVIDER_SITE_OTHER): Payer: Self-pay

## 2011-11-18 DIAGNOSIS — R1011 Right upper quadrant pain: Secondary | ICD-10-CM

## 2011-11-18 NOTE — Telephone Encounter (Signed)
Message copied by Joanette Gula on Mon Nov 18, 2011  2:57 PM ------      Message from: Ernestene Mention      Created: Mon Nov 18, 2011  1:58 PM       Tell Ms. Wimmer I am not sure whether ultrasound shows biliary tract disease or not.  It cannot rule out CBD stones.            Recommend CCK stimulated HIDA Scan.  Referral to GI if Dx still unclear after HIDA.            HMI                  ----- Message -----         From: Rad Results In Interface         Sent: 11/12/2011  10:34 AM           To: Ernestene Mention, MD

## 2011-11-18 NOTE — Telephone Encounter (Signed)
Pt notified of hida scan w/cck date of 11-26-11.

## 2011-11-18 NOTE — Telephone Encounter (Signed)
Pt notified of U/S result and need for hida scan. I advised pt of test and how it is done and why we want this test. Pt advised I will call her with date of scan.

## 2011-11-26 ENCOUNTER — Encounter (HOSPITAL_COMMUNITY)
Admission: RE | Admit: 2011-11-26 | Discharge: 2011-11-26 | Disposition: A | Discharge status: Home / Self Care | Payer: Managed Care, Other (non HMO) | Source: Ambulatory Visit | Attending: General Surgery | Admitting: General Surgery

## 2011-11-26 DIAGNOSIS — R1011 Right upper quadrant pain: Secondary | ICD-10-CM

## 2011-11-26 MED ORDER — SINCALIDE 5 MCG IJ SOLR
INTRAMUSCULAR | Status: AC
  Administered 2011-11-26: 1.33 ug via INTRAVENOUS
  Filled 2011-11-26: qty 5

## 2011-11-26 MED ORDER — TECHNETIUM TC 99M MEBROFENIN IV KIT
5.0000 | PACK | Freq: Once | INTRAVENOUS | Status: AC | PRN
  Administered 2011-11-26: 5 via INTRAVENOUS

## 2011-11-26 MED ORDER — SINCALIDE 5 MCG IJ SOLR
0.0200 ug/kg | Freq: Once | INTRAMUSCULAR | Status: AC
  Administered 2011-11-26: 1.33 ug via INTRAVENOUS

## 2011-11-28 ENCOUNTER — Telehealth: Payer: Self-pay | Admitting: Internal Medicine

## 2011-11-28 ENCOUNTER — Telehealth (INDEPENDENT_AMBULATORY_CARE_PROVIDER_SITE_OTHER): Payer: Self-pay

## 2011-11-28 ENCOUNTER — Other Ambulatory Visit (INDEPENDENT_AMBULATORY_CARE_PROVIDER_SITE_OTHER): Payer: Self-pay

## 2011-11-28 DIAGNOSIS — R1011 Right upper quadrant pain: Secondary | ICD-10-CM

## 2011-11-28 NOTE — Telephone Encounter (Signed)
Dr. [name], Dr. Jacinto Halim office is requesting an ERCP on this patient for abdominal pain chronice RUQ. HIDA- normal, ultrasound- prominent CBD. When would you like me to try to schedule this? Please, advise.

## 2011-11-28 NOTE — Telephone Encounter (Signed)
PT NOTIFIED OF HIDA SCAN RESULT AND NEED FOR GI REF. TO EVAL NARROWING OF CBD ON U/S.

## 2011-11-28 NOTE — Telephone Encounter (Signed)
I have reviewed the chart, normal HIDA, normal sono, CBD upper limits of normal, normal LFT's except for mild elevation of ALT on one occasion. I would have to see her in the office to see if ERCP indicated.

## 2011-11-29 NOTE — Telephone Encounter (Signed)
Spoke with Arline Asp at Dr. Jacinto Halim office and told her we are going to see patient in office on 12/02/11 at 9:15 AM. Left a message for patient to call me.

## 2011-11-29 NOTE — Telephone Encounter (Signed)
Spoke with patient and gave her appointment date and time. 

## 2011-11-29 NOTE — Telephone Encounter (Signed)
[place]

## 2011-12-02 ENCOUNTER — Ambulatory Visit (INDEPENDENT_AMBULATORY_CARE_PROVIDER_SITE_OTHER): Payer: Managed Care, Other (non HMO) | Admitting: Internal Medicine

## 2011-12-02 ENCOUNTER — Other Ambulatory Visit (INDEPENDENT_AMBULATORY_CARE_PROVIDER_SITE_OTHER): Payer: Managed Care, Other (non HMO)

## 2011-12-02 ENCOUNTER — Encounter: Payer: Self-pay | Admitting: Internal Medicine

## 2011-12-02 VITALS — BP 140/72 | HR 60 | Ht 65.0 in | Wt 148.0 lb

## 2011-12-02 DIAGNOSIS — R1011 Right upper quadrant pain: Secondary | ICD-10-CM

## 2011-12-02 DIAGNOSIS — R932 Abnormal findings on diagnostic imaging of liver and biliary tract: Secondary | ICD-10-CM

## 2011-12-02 LAB — HEPATIC FUNCTION PANEL
ALT: 25 U/L (ref 0–35)
AST: 21 U/L (ref 0–37)
Albumin: 4 g/dL (ref 3.5–5.2)
Alkaline Phosphatase: 58 U/L (ref 39–117)
Bilirubin, Direct: 0.1 mg/dL (ref 0.0–0.3)
Total Bilirubin: 0.9 mg/dL (ref 0.3–1.2)
Total Protein: 6.5 g/dL (ref 6.0–8.3)

## 2011-12-02 MED ORDER — DICYCLOMINE HCL 10 MG PO CAPS: 10.0000 mg | ORAL_CAPSULE | Freq: Two times a day (BID) | ORAL | Status: DC

## 2011-12-02 MED ORDER — PSYLLIUM 28 % PO PACK: 1.0000 | PACK | ORAL | Status: DC

## 2011-12-02 NOTE — Progress Notes (Signed)
Markiyah Gahm [birth year] MRN [id number]   History of Present Illness:  This is a 66 year old white female with persistent right upper quadrant abdominal pain located along the right costal margin and radiating laterally. It is not associated with food or with bowel movements. There has been no nausea or change in bowel habits. Her upper abdominal ultrasound showed the common bile duct to be 8.1 mm with an otherwise normal-appearing gallbladder. A HIDA scan on 11/18/2011 was normal with a 75% ejection fraction. She has a history of cecal carcinoma in 2006. She is status post right hemicolectomy for a T3N0 lesion. Her last colonoscopy within the last few months showed a normal ileocolic anastomosis. She had a CT scan of the abdomen and pelvis in 2009 which was negative. There is a history of breast carcinoma in 1995 and a recurrence of cancer in 1999 for which she had a stem cell transplant at [facility]; she has been disease free. Her liver function tests over the past several years has been normal except the last set of liver tests in April 2013 showed mild elevation of ALT of 44. She is feeling well otherwise.   Past Medical History  Diagnosis Date  . CARCINOMA, BREAST 1995  . COLON CANCER 2006    T3 N0 tumor  . HEMORRHOIDS   . Hypertension    Past Surgical History  Procedure Date  . Bone marrow transplant 1999  . Total abdominal hysterectomy w/ bilateral salpingoophorectomy 1996  . Hemicolectomy 01/2005    right  . Breast lumpectomy     left breast  . Inguinal hernia repair     reports that she quit smoking about 20 years ago. She has never used smokeless tobacco. She reports that she does not drink alcohol or use illicit drugs. family history includes Heart attack in her father and Uterine cancer in her mother.  There is no history of Colon cancer, and Esophageal cancer, and Stomach cancer, and Rectal cancer, . Allergies  Allergen Reactions  . Levofloxacin Other (See Comments)    Red  streaks to IV site when IV dose infusing and arm feeling very irritated, throbbing, painful        Review of Systems: Denies reflux symptoms. Nausea vomiting chest pain  The remainder of the 10 point ROS is negative except as outlined in H&P   Physical Exam: General appearance  Well developed, in no distress. Eyes- non icteric. HEENT nontraumatic, normocephalic. Mouth no lesions, tongue papillated, no cheilosis. Neck supple without adenopathy, thyroid not enlarged, no carotid bruits, no JVD. Lungs Clear to auscultation bilaterally. Cor normal S1, normal S2, regular rhythm, no murmur,  quiet precordium. Abdomen: Soft relaxed abdomen with normoactive bowel sounds. I cannot reproduce right upper quadrant pain. Liver edge is at costal margin. There is a well-healed surgical scar. No palpable mass. Rectal: Not done, she just had a colonoscopy. Extremities no pedal edema. Skin no lesions. Neurological alert and oriented x 3. Psychological normal mood and affect.  Assessment and Plan:  Problem #1 Right upper quadrant abdominal discomfort and pain with normal biliary function. Patient had essentially normal liver function tests except for one set where her transaminase was slightly elevated. Her common bile duct is at the upper limits of normal. She has had a right hemicolectomy and is cancer free but her symptoms of right upper quadrant discomfort may be related to her colon. For instance, she may have irritable bowel syndrome, bacterial overgrowth or adhesions. For that reason, I would like to give her  a trial of a bowel regimen which would include Metamucil and Bentyl as an antispasmodic for presumed irritable bowel syndrome. She is leaving for [place] for 3 weeks and will be back in mid June and we will set up an ERCP for late June if her symptoms are not resolved by that time. We will obtain another set of liver function tests today. I think there is a chance there is a stone in the common  bile duct or a common bile duct stricture. Some sort of biliary spasm is certainly a possibility but in the setting of normal liver tests, it is less likely.   12/02/2011 [name]

## 2011-12-02 NOTE — Patient Instructions (Addendum)
You have been scheduled for an ERCP with propofol. Please follow written instructions given to you at your visit today. We have sent the following medications to your pharmacy for you to pick up at your convenience: Bentyl Your physician has requested that you go to the basement for the following lab work before leaving today: LFT's Please purchase Metamucil over the counter. Take as directed. CC: Dr [name], Dr Rexene Edison.[name]

## 2012-01-15 ENCOUNTER — Encounter (HOSPITAL_COMMUNITY): Payer: Self-pay | Admitting: *Deleted

## 2012-01-21 ENCOUNTER — Ambulatory Visit (HOSPITAL_COMMUNITY): Payer: Managed Care, Other (non HMO) | Admitting: Registered Nurse

## 2012-01-21 ENCOUNTER — Ambulatory Visit (HOSPITAL_COMMUNITY)
Admission: RE | Admit: 2012-01-21 | Discharge: 2012-01-21 | Disposition: A | Discharge status: Home / Self Care | Payer: Managed Care, Other (non HMO) | Source: Ambulatory Visit | Attending: Registered Nurse | Admitting: Registered Nurse

## 2012-01-21 ENCOUNTER — Encounter (HOSPITAL_COMMUNITY): Payer: Self-pay | Admitting: *Deleted

## 2012-01-21 ENCOUNTER — Ambulatory Visit (HOSPITAL_COMMUNITY): Payer: Managed Care, Other (non HMO)

## 2012-01-21 ENCOUNTER — Encounter: Payer: Self-pay | Admitting: *Deleted

## 2012-01-21 ENCOUNTER — Encounter (HOSPITAL_COMMUNITY): Admission: RE | Disposition: A | Discharge status: Home / Self Care | Payer: Self-pay | Source: Ambulatory Visit

## 2012-01-21 ENCOUNTER — Encounter (HOSPITAL_COMMUNITY): Payer: Self-pay | Admitting: Registered Nurse

## 2012-01-21 DIAGNOSIS — R945 Abnormal results of liver function studies: Secondary | ICD-10-CM | POA: Insufficient documentation

## 2012-01-21 DIAGNOSIS — Z85038 Personal history of other malignant neoplasm of large intestine: Secondary | ICD-10-CM | POA: Insufficient documentation

## 2012-01-21 DIAGNOSIS — R932 Abnormal findings on diagnostic imaging of liver and biliary tract: Secondary | ICD-10-CM

## 2012-01-21 DIAGNOSIS — I10 Essential (primary) hypertension: Secondary | ICD-10-CM | POA: Insufficient documentation

## 2012-01-21 DIAGNOSIS — R1011 Right upper quadrant pain: Secondary | ICD-10-CM | POA: Insufficient documentation

## 2012-01-21 DIAGNOSIS — K8050 Calculus of bile duct without cholangitis or cholecystitis without obstruction: Secondary | ICD-10-CM | POA: Insufficient documentation

## 2012-01-21 DIAGNOSIS — R7989 Other specified abnormal findings of blood chemistry: Secondary | ICD-10-CM

## 2012-01-21 DIAGNOSIS — R1031 Right lower quadrant pain: Secondary | ICD-10-CM

## 2012-01-21 DIAGNOSIS — Z853 Personal history of malignant neoplasm of breast: Secondary | ICD-10-CM | POA: Insufficient documentation

## 2012-01-21 DIAGNOSIS — Z9481 Bone marrow transplant status: Secondary | ICD-10-CM | POA: Insufficient documentation

## 2012-01-21 HISTORY — PX: ERCP: SHX5425

## 2012-01-21 SURGERY — ERCP, WITH INTERVENTION IF INDICATED
Anesthesia: General

## 2012-01-21 MED ORDER — HYDRALAZINE HCL 20 MG/ML IJ SOLN
5.0000 mg | INTRAMUSCULAR | Status: DC | PRN
  Administered 2012-01-21 (×3): 5 mg via INTRAVENOUS

## 2012-01-21 MED ORDER — LIDOCAINE HCL (CARDIAC) 20 MG/ML IV SOLN
INTRAVENOUS | Status: DC | PRN
  Administered 2012-01-21: 60 mg via INTRAVENOUS

## 2012-01-21 MED ORDER — ONDANSETRON HCL 4 MG/2ML IJ SOLN
INTRAMUSCULAR | Status: DC | PRN
  Administered 2012-01-21: 4 mg via INTRAVENOUS

## 2012-01-21 MED ORDER — GLUCAGON HCL (RDNA) 1 MG IJ SOLR
INTRAMUSCULAR | Status: DC | PRN
  Administered 2012-01-21: .5 mg via INTRAVENOUS

## 2012-01-21 MED ORDER — MEPERIDINE HCL 100 MG/ML IJ SOLN
50.0000 mg | Freq: Once | INTRAMUSCULAR | Status: AC
  Administered 2012-01-21: 50 mg via INTRAMUSCULAR

## 2012-01-21 MED ORDER — LACTATED RINGERS IV SOLN: INTRAVENOUS | Status: DC

## 2012-01-21 MED ORDER — SUCCINYLCHOLINE CHLORIDE 20 MG/ML IJ SOLN
INTRAMUSCULAR | Status: DC | PRN
  Administered 2012-01-21: 100 mg via INTRAVENOUS

## 2012-01-21 MED ORDER — GLUCAGON HCL (RDNA) 1 MG IJ SOLR
INTRAMUSCULAR | Status: AC
  Filled 2012-01-21: qty 2

## 2012-01-21 MED ORDER — IOHEXOL 350 MG/ML SOLN
INTRAVENOUS | Status: DC | PRN
  Administered 2012-01-21: 10:00:00

## 2012-01-21 MED ORDER — HYDRALAZINE HCL 20 MG/ML IJ SOLN
INTRAMUSCULAR | Status: AC
  Filled 2012-01-21: qty 1

## 2012-01-21 MED ORDER — PROPOFOL 10 MG/ML IV EMUL
INTRAVENOUS | Status: DC | PRN
  Administered 2012-01-21: 150 mg via INTRAVENOUS

## 2012-01-21 MED ORDER — PROMETHAZINE HCL 25 MG/ML IJ SOLN: 6.2500 mg | INTRAMUSCULAR | Status: DC | PRN

## 2012-01-21 MED ORDER — PROMETHAZINE HCL 25 MG/ML IJ SOLN
25.0000 mg | INTRAMUSCULAR | Status: DC | PRN
  Administered 2012-01-21: 25 mg via INTRAMUSCULAR

## 2012-01-21 MED ORDER — MEPERIDINE HCL 100 MG/ML IJ SOLN
INTRAMUSCULAR | Status: AC
  Filled 2012-01-21: qty 1

## 2012-01-21 MED ORDER — FENTANYL CITRATE 0.05 MG/ML IJ SOLN
INTRAMUSCULAR | Status: DC | PRN
  Administered 2012-01-21 (×2): 50 ug via INTRAVENOUS

## 2012-01-21 MED ORDER — PROMETHAZINE HCL 25 MG/ML IJ SOLN
INTRAMUSCULAR | Status: AC
  Filled 2012-01-21: qty 1

## 2012-01-21 MED ORDER — SODIUM CHLORIDE 0.9 % IV SOLN
INTRAVENOUS | Status: AC
  Filled 2012-01-21: qty 1.5

## 2012-01-21 MED ORDER — LIDOCAINE HCL 2 % IJ SOLN
INTRAMUSCULAR | Status: AC
  Filled 2012-01-21: qty 1

## 2012-01-21 MED ORDER — SODIUM CHLORIDE 0.9 % IV SOLN
1.5000 g | Freq: Once | INTRAVENOUS | Status: AC
  Administered 2012-01-21: 1.5 g via INTRAVENOUS
  Filled 2012-01-21: qty 1.5

## 2012-01-21 MED ORDER — MIDAZOLAM HCL 5 MG/5ML IJ SOLN
INTRAMUSCULAR | Status: DC | PRN
  Administered 2012-01-21: 2 mg via INTRAVENOUS

## 2012-01-21 MED ORDER — LACTATED RINGERS IV SOLN
INTRAVENOUS | Status: DC | PRN
  Administered 2012-01-21: 08:00:00 via INTRAVENOUS

## 2012-01-21 NOTE — Anesthesia Postprocedure Evaluation (Signed)
  Anesthesia Post-op Note  Patient: Eileen [name]  Procedure(s) Performed: Procedure(s) (LRB): ENDOSCOPIC RETROGRADE CHOLANGIOPANCREATOGRAPHY (ERCP) ([name])  Patient Location: PACU  Anesthesia Type: General  Level of Consciousness: awake, alert  and oriented  Airway and Oxygen Therapy: Patient Spontanous Breathing  Post-op Pain: mild  Post-op Assessment: Post-op Vital signs reviewed  Post-op Vital Signs: stable  Complications: No apparent anesthesia complications

## 2012-01-21 NOTE — Op Note (Signed)
[facility] [address] [place], [place]  [address]  ERCP PROCEDURE REPORT  PATIENT:  Eileen [name], [name]  MR#:  [id number] BIRTHDATE:  July 02, 1946  GENDER:  female  ENDOSCOPIST:  Eileen [name] Morton. [name], MD ASSISTANT:  Kizzie Bane, Debi Mays, RN, CGRN, Darral Dash, RN  PROCEDURE DATE:  01/21/2012 PROCEDURE:  ERCP w/ direct viz bile duct, ERCP with sphincterotomy, ERCP with balloon passage ASA CLASS:  Class II  INDICATIONS:  abdominal pain, abnormal imaging, abnormal Liver Function Tests RUQ abd. pain  MEDICATIONS:   These medications were titrated to patient response per physician's verbal order, general anesthewsia(edit list) TOPICAL ANESTHETIC:  DESCRIPTION OF PROCEDURE:   After the risks benefits and alternatives of the procedure were thoroughly explained, informed consent was obtained.  The [id number]> endoscope was introduced through the mouth and advanced to the second portion of the duodenum.  The common bile duct was successfully cannulated and filled with contrast. in the common hepatic duct 8 mm CBD with smooth tapering, no stones seen on fluoro, Sphincterotomy was performed with a regular 20 mm papillotome. A stone retrieval balloon was passed and all apparent stones removed (see image1, image2, image3, and image4). 12 mm baloon passed x 2 with recovery of a yellow bile, no stone or sludge  The cystic duct was patent. The gallbladder was normal, and there was no evidence of cholecystitis or obvious stones.  normal duodenum.  pancreatic duct - no attempt.    The scope was then completely withdrawn from the patient and the procedure terminated. <<PROCEDUREIMAGES>>  COMPLICATIONS:  None  ENDOSCOPIC IMPRESSION: 1) Bile duct cannulation in the common hepatic duct 2) Normal gallbladder/cystic duct 3) Normal duodenum 4) Pancreatic duct - no attempt s/p shincterotomy, 8 mm CBD without stones or sludge after passage of 12 mm  baloon RECOMMENDATIONS: follow LFT's, follow abd. pain, OV 4 weeks  ______________________________ Eileen [name] Morton. [name], MD  CC:  Claud Kelp, MD  n. [name] at 01/21/2012 10:12 AM  Adah Salvage, [id number]

## 2012-01-21 NOTE — Discharge Instructions (Addendum)
Resume regular diet today,  Please make an appointment to see me in about 4 weeks.  Endoscopic Retrograde Cholangiopancreatography This is a test used to evaluate people with jaundice (a condition in which the person's skin is turning yellow because of the high level of bilirubin in your blood). Bilirubin is a product of the blood which is elevated when an obstruction in the bile duct occurs. The bile ducts are the pipe-like system which carry the bile from the liver to the gallbladder and out into your small bowel. In this test, a fiber optic endoscope (a small pencil-sized telescope) is inserted and a catheter is put into ducts for examination. This test can reveal stones, strictures, cysts, tumors, and other irregularities within the pancreatic ducts and bile ducts. PREPARATION FOR TEST Do not eat or drink after midnight the day before the test.  NORMAL FINDINGS Normal size of biliary and pancreatic ducts. No obstruction or filling defects within the biliary or pancreatic ducts. Ranges for normal findings may vary among different laboratories and hospitals. You should always check with your doctor after having lab work or other tests done to discuss the meaning of your test results and whether your values are considered within normal limits. MEANING OF TEST  Your caregiver will go over the test results with you and discuss the importance and meaning of your results, as well as treatment options and the need for additional tests if necessary. OBTAINING THE TEST RESULTS  It is your responsibility to obtain your test results. Ask the lab or department performing the test when and how you will get your results.  Endoscopy Care After Please read the instructions outlined below and refer to this sheet in the next few weeks. These discharge instructions provide you with general information on caring for yourself after you leave the hospital. Your doctor may also give you specific instructions. While  your treatment has been planned according to the most current medical practices available, unavoidable complications occasionally occur. If you have any problems or questions after discharge, please call your doctor. HOME CARE INSTRUCTIONS Activity  You may resume your regular activity but move at a slower pace for the next 24 hours.   Take frequent rest periods for the next 24 hours.   Walking will help expel (get rid of) the air and reduce the bloated feeling in your abdomen.   No driving for 24 hours (because of the anesthesia (medicine) used during the test).   You may shower.   Do not sign any important legal documents or operate any machinery for 24 hours (because of the anesthesia used during the test).  Nutrition  Drink plenty of fluids.   You may resume your normal diet.   Begin with a light meal and progress to your normal diet.   Avoid alcoholic beverages for 24 hours or as instructed by your caregiver.  Medications You may resume your normal medications unless your caregiver tells you otherwise. What you can expect today  You may experience abdominal discomfort such as a feeling of fullness or "gas" pains.   You may experience a sore throat for 2 to 3 days. This is normal. Gargling with salt water may help this.  Follow-up Your doctor will discuss the results of your test with you. SEEK IMMEDIATE MEDICAL CARE IF:  You have excessive nausea (feeling sick to your stomach) and/or vomiting.   You have severe abdominal pain and distention (swelling).   You have trouble swallowing.   You have a  temperature over 100 F (37.8 C).   You have rectal bleeding or vomiting of blood.  Document Released: 02/27/2004 Document Revised: 07/04/2011 Document Reviewed: 09/09/2007 [facility] Patient Information 2012 [place], [place].

## 2012-01-21 NOTE — H&P (Signed)
Eileen [name] 27-Sep-1945 MRN [id number]        History of Present Illness:RUQ abd. pain  This is a 66 y.o WF with RUQ abd. Pain, borderline dilated CBD 8 mm and intermittent mild elevation of LFTs. She has not responded to IBS regimen. She is up to date on her colonoscopy. Hx of colon cancer and breast cancer   Past Medical History  Diagnosis Date  . HEMORRHOIDS   . Hypertension   . CARCINOMA, BREAST 1995  . COLON CANCER 2006    T3 N0 tumor   Past Surgical History  Procedure Date  . Bone marrow transplant 1999  . Total abdominal hysterectomy w/ bilateral salpingoophorectomy 1996  . Hemicolectomy 01/2005    right  . Breast lumpectomy     left breast  . Inguinal hernia repair     reports that she quit smoking about 21 years ago. She has never used smokeless tobacco. She reports that she does not drink alcohol or use illicit drugs. family history includes Heart attack in her father and Uterine cancer in her mother.  There is no history of Colon cancer, and Esophageal cancer, and Stomach cancer, and Rectal cancer, . Allergies  Allergen Reactions  . Levofloxacin Other (See Comments)    Red streaks to IV site when IV dose infusing and arm feeling very irritated, throbbing, painful        Review of Systems:  The remainder of the 10 point ROS is negative except as outlined in H&P   Physical Exam: General appearance  Well developed, in no distress. Eyes- non icteric. HEENT nontraumatic, normocephalic. Mouth no lesions, tongue papillated, no cheilosis. Neck supple without adenopathy, thyroid not enlarged, no carotid bruits, no JVD. Lungs Clear to auscultation bilaterally. Cor normal S1, normal S2, regular rhythm, no murmur,  quiet precordium. Abdomen: mild tenderness RUQ, normal bowl sounds Rectal:deferred Extremities no pedal edema. Skin no lesions. Neurological alert and oriented x 3. Psychological normal mood and affect.  Assessment and Plan: Chronic RUQ abd.  pain, ,prominent CBD, r/o retained CBD stone, mild elevation of LFT's. Plan ERCP and sweep of the CBD, possible sphincterotomy   01/21/2012 Eileen [name]

## 2012-01-21 NOTE — Anesthesia Preprocedure Evaluation (Addendum)
Anesthesia Evaluation  Patient identified by MRN, date of birth, ID band Patient awake    Reviewed: Allergy & Precautions, H&P , NPO status , Patient's Chart, lab work & pertinent test results  History of Anesthesia Complications Negative for: history of anesthetic complications  Airway Mallampati: II TM Distance: >3 FB Neck ROM: Full    Dental  (+) Teeth Intact and Dental Advisory Given   Pulmonary former smoker breath sounds clear to auscultation  Pulmonary exam normal       Cardiovascular hypertension, Pt. on medications + dysrhythmias Rhythm:Regular Rate:Normal     Neuro/Psych TIAnegative psych ROS   GI/Hepatic negative GI ROS, Neg liver ROS, Hx Colon Cancer   Endo/Other  negative endocrine ROS  Renal/GU negative Renal ROS  negative genitourinary   Musculoskeletal negative musculoskeletal ROS (+)   Abdominal   Peds negative pediatric ROS (+)  Hematology negative hematology ROS (+)   Anesthesia Other Findings   Reproductive/Obstetrics negative OB ROS                        Anesthesia Physical Anesthesia Plan  ASA: II  Anesthesia Plan: General   Post-op Pain Management:    Induction: Intravenous  Airway Management Planned: Oral ETT  Additional Equipment:   Intra-op Plan:   Post-operative Plan: Extubation in OR  Informed Consent: I have reviewed the patients History and Physical, chart, labs and discussed the procedure including the risks, benefits and alternatives for the proposed anesthesia with the patient or authorized representative who has indicated his/her understanding and acceptance.   Dental advisory given  Plan Discussed with: CRNA  Anesthesia Plan Comments:        Anesthesia Quick Evaluation

## 2012-01-21 NOTE — Preoperative (Signed)
Beta Blockers   Reason not to administer Beta Blockers:Not Applicable 

## 2012-01-21 NOTE — Transfer of Care (Signed)
Immediate Anesthesia Transfer of Care Note  Patient: Eileen [name]  Procedure(s) Performed: Procedure(s) (LRB): ENDOSCOPIC RETROGRADE CHOLANGIOPANCREATOGRAPHY (ERCP) ([name])  Patient Location: PACU  Anesthesia Type: General  Level of Consciousness: awake, alert , oriented and patient cooperative  Airway & Oxygen Therapy: Patient Spontanous Breathing and Patient connected to face mask oxygen  Post-op Assessment: Report given to PACU RN and Post -op Vital signs reviewed and stable  Post vital signs: Reviewed and stable  Complications: No apparent anesthesia complications

## 2012-01-22 ENCOUNTER — Encounter (HOSPITAL_COMMUNITY): Payer: Self-pay | Admitting: Internal Medicine

## 2012-02-21 ENCOUNTER — Ambulatory Visit (INDEPENDENT_AMBULATORY_CARE_PROVIDER_SITE_OTHER): Payer: Managed Care, Other (non HMO) | Admitting: Internal Medicine

## 2012-02-21 ENCOUNTER — Telehealth: Payer: Self-pay | Admitting: Hematology and Oncology

## 2012-02-21 ENCOUNTER — Encounter: Payer: Self-pay | Admitting: Internal Medicine

## 2012-02-21 VITALS — BP 158/80 | HR 76 | Ht 65.0 in | Wt 147.0 lb

## 2012-02-21 DIAGNOSIS — R1011 Right upper quadrant pain: Secondary | ICD-10-CM

## 2012-02-21 DIAGNOSIS — R932 Abnormal findings on diagnostic imaging of liver and biliary tract: Secondary | ICD-10-CM

## 2012-02-21 DIAGNOSIS — R7402 Elevation of levels of lactic acid dehydrogenase (LDH): Secondary | ICD-10-CM

## 2012-02-21 NOTE — Patient Instructions (Addendum)
Dr Dalene Carrow, Dr Rexene [place].[name], DR [name]

## 2012-02-21 NOTE — Telephone Encounter (Signed)
S/w pt re appts for 8/21 and 8/23.

## 2012-02-21 NOTE — Progress Notes (Signed)
Eileen [name] [birth year] MRN [id number]  History of Present Illness:  This is a 66 year old white female with a history of breast cancer and colon cancer with persistent right upper quadrant discomfort. She had an ERCP and sphincterotomy on 01/21/2012  to clarify  minor elevation of her liver function tests and dilated common bile duct to 8 mm. No stones were found on ERC.. Patient underwent a sphincterotomy and sweep of the common bile duct. She comes today for followup. The episodes of pain have decreased in frequency but she had several episodes with more severe pain. The episodes are not associated with meals or with bowel movements. She has not responded to Bentyl and Metamucil. Her last CT scan of the abdomen was in 2011 for followup of breast cancer and it did not show any metastatic disease. She is up-to-date on her colonoscopy. Her weight has been stable and she has been feeling fine.   Past Medical History  Diagnosis Date  . HEMORRHOIDS   . Hypertension   . CARCINOMA, BREAST 1995  . COLON CANCER 2006    T3 N0 tumor   Past Surgical History  Procedure Date  . Bone marrow transplant 1999  . Total abdominal hysterectomy w/ bilateral salpingoophorectomy 1996  . Hemicolectomy 01/2005    right  . Breast lumpectomy     left breast  . Inguinal hernia repair   . Ercp 01/21/2012    Procedure: ENDOSCOPIC RETROGRADE CHOLANGIOPANCREATOGRAPHY (ERCP);  Surgeon: Hart Carwin, MD;  Location: [facility];  Service: Endoscopy;  Laterality: [name];    reports that she quit smoking about 21 years ago. She has never used smokeless tobacco. She reports that she does not drink alcohol or use illicit drugs. family history includes Heart attack in her father and Uterine cancer in her mother.  There is no history of Colon cancer, and Esophageal cancer, and Stomach cancer, and Rectal cancer, . Allergies  Allergen Reactions  . Levofloxacin Other (See Comments)    Red streaks to IV site when IV dose infusing  and arm feeling very irritated, throbbing, painful        Review of Systems: Denies dyspepsia heartburn fever jaundice  The remainder of the 10 point ROS is negative except as outlined in H&P   Physical Exam: General appearance  Well developed, in no distress. Eyes- non icteric. HEENT nontraumatic, normocephalic. Mouth no lesions, tongue papillated, no cheilosis. Neck supple without adenopathy, thyroid not enlarged, no carotid bruits, no JVD. Lungs Clear to auscultation bilaterally. Cor normal S1, normal S2, regular rhythm, no murmur,  quiet precordium. Abdomen: Soft abdomen with normal active bowel sounds. Well-healed surgical scar. Liver edge at costal margin. No ascites, no distention. Rectal: Not done Extremities no pedal edema. Skin no lesions. Neurological alert and oriented x 3. Psychological normal mood and affect.  Assessment and Plan:  Problem #1 Right upper quadrant discomfort of unknown etiology. A comprehensive evaluation has been negative. There is no evidence of recurrent cancer. Her common bile duct was slightly dilated but there were no stones. Her last liver function tests were normal. She may have adhesions causing her symptoms. No other evaluation is planned. She will be seeing Dr.Odogwu shortly for followup of her breast and colon cancer. She may need a repeat CT scan at some point as per Dr Dalene Carrow and we are going to see her on a when necessary basis.    02/21/2012 [name]

## 2012-03-10 ENCOUNTER — Telehealth: Payer: Self-pay | Admitting: *Deleted

## 2012-03-10 ENCOUNTER — Other Ambulatory Visit: Payer: Self-pay | Admitting: Hematology and Oncology

## 2012-03-10 ENCOUNTER — Other Ambulatory Visit: Payer: Self-pay | Admitting: *Deleted

## 2012-03-10 DIAGNOSIS — C50919 Malignant neoplasm of unspecified site of unspecified female breast: Secondary | ICD-10-CM

## 2012-03-10 DIAGNOSIS — C189 Malignant neoplasm of colon, unspecified: Secondary | ICD-10-CM

## 2012-03-10 NOTE — Telephone Encounter (Signed)
Pt called and stated she has lab appt on 03/18/12.  Pt would like to ask Dr. Dalene Carrow to order CT scans on same day for pt.  Pt stated she has been experiencing  Pain on  Right side of abdomen.   Pt had work-up studies done by Dr. Diamond Nickel on 03/09/2023 ;  Had  [place] procedure ordered by Dr. [name] ,  And xray study.   Pt stated all procedures and radiology results were negative.   However, pt still has intermittent pain at right abdomen.   Pt would like to have CT scan done to assure nothing else is going on.    Message to Dr. Dalene Carrow to review. Pt's   Phone    [phone].

## 2012-03-12 ENCOUNTER — Other Ambulatory Visit: Payer: Self-pay | Admitting: *Deleted

## 2012-03-13 ENCOUNTER — Telehealth: Payer: Self-pay | Admitting: Hematology and Oncology

## 2012-03-13 NOTE — Telephone Encounter (Signed)
Added ct for 8/21 @ 3:30 pm @ [facility] [address] lmonvm for pt today re appt d/t/location/instruction and also confirmed 8/21 lb appt and 8/23 f/u appt. Also lm for Lilyan Punt yesterday informing her that ct would be scheduled for 8/21 @ [facility]. Bonita Quin to return to office today.

## 2012-03-18 ENCOUNTER — Ambulatory Visit
Admission: RE | Admit: 2012-03-18 | Discharge: 2012-03-18 | Disposition: A | Discharge status: Home / Self Care | Payer: Managed Care, Other (non HMO) | Source: Ambulatory Visit | Attending: Hematology and Oncology | Admitting: Hematology and Oncology

## 2012-03-18 ENCOUNTER — Other Ambulatory Visit (HOSPITAL_BASED_OUTPATIENT_CLINIC_OR_DEPARTMENT_OTHER): Payer: Managed Care, Other (non HMO) | Admitting: Lab

## 2012-03-18 DIAGNOSIS — C180 Malignant neoplasm of cecum: Secondary | ICD-10-CM

## 2012-03-18 DIAGNOSIS — C189 Malignant neoplasm of colon, unspecified: Secondary | ICD-10-CM

## 2012-03-18 DIAGNOSIS — C50919 Malignant neoplasm of unspecified site of unspecified female breast: Secondary | ICD-10-CM

## 2012-03-18 LAB — CBC WITH DIFFERENTIAL/PLATELET
BASO%: 0.3 % (ref 0.0–2.0)
Basophils Absolute: 0 10*3/uL (ref 0.0–0.1)
EOS%: 0.6 % (ref 0.0–7.0)
Eosinophils Absolute: 0 10*3/uL (ref 0.0–0.5)
HCT: 38.3 % (ref 34.8–46.6)
HGB: 12.8 g/dL (ref 11.6–15.9)
LYMPH%: 29.4 % (ref 14.0–49.7)
MCH: 28.2 pg (ref 25.1–34.0)
MCHC: 33.4 g/dL (ref 31.5–36.0)
MCV: 84.4 fL (ref 79.5–101.0)
MONO#: 0.4 10*3/uL (ref 0.1–0.9)
MONO%: 6.5 % (ref 0.0–14.0)
NEUT#: 4 10*3/uL (ref 1.5–6.5)
NEUT%: 63.2 % (ref 38.4–76.8)
Platelets: 240 10*3/uL (ref 145–400)
RBC: 4.54 10*6/uL (ref 3.70–5.45)
RDW: 13.2 % (ref 11.2–14.5)
WBC: 6.4 10*3/uL (ref 3.9–10.3)
lymph#: 1.9 10*3/uL (ref 0.9–3.3)

## 2012-03-18 LAB — CMP (CANCER CENTER ONLY)
ALT(SGPT): 40 U/L (ref 10–47)
AST: 37 U/L (ref 11–38)
Albumin: 3.8 g/dL (ref 3.3–5.5)
Alkaline Phosphatase: 83 U/L (ref 26–84)
BUN, Bld: 13 mg/dL (ref 7–22)
CO2: 31 mEq/L (ref 18–33)
Calcium: 9.1 mg/dL (ref 8.0–10.3)
Chloride: 100 mEq/L (ref 98–108)
Creat: 0.9 mg/dl (ref 0.6–1.2)
Glucose, Bld: 92 mg/dL (ref 73–118)
Potassium: 4.2 mEq/L (ref 3.3–4.7)
Sodium: 139 mEq/L (ref 128–145)
Total Bilirubin: 0.7 mg/dl (ref 0.20–1.60)
Total Protein: 6.8 g/dL (ref 6.4–8.1)

## 2012-03-18 LAB — CEA: CEA: 1.6 ng/mL (ref 0.0–5.0)

## 2012-03-18 LAB — LACTATE DEHYDROGENASE: LDH: 170 U/L (ref 94–250)

## 2012-03-18 LAB — CANCER ANTIGEN 27.29: CA 27.29: 33 U/mL (ref 0–39)

## 2012-03-18 MED ORDER — IOHEXOL 300 MG/ML  SOLN
100.0000 mL | Freq: Once | INTRAMUSCULAR | Status: AC | PRN
  Administered 2012-03-18: 100 mL via INTRAVENOUS

## 2012-03-19 ENCOUNTER — Encounter: Payer: Self-pay | Admitting: *Deleted

## 2012-03-20 ENCOUNTER — Encounter: Payer: Self-pay | Admitting: Hematology and Oncology

## 2012-03-20 ENCOUNTER — Telehealth: Payer: Self-pay | Admitting: Hematology and Oncology

## 2012-03-20 ENCOUNTER — Ambulatory Visit (HOSPITAL_BASED_OUTPATIENT_CLINIC_OR_DEPARTMENT_OTHER): Payer: Managed Care, Other (non HMO) | Admitting: Hematology and Oncology

## 2012-03-20 VITALS — BP 165/93 | HR 68 | Temp 97.6°F | Resp 20 | Ht 65.0 in | Wt 148.7 lb

## 2012-03-20 DIAGNOSIS — C189 Malignant neoplasm of colon, unspecified: Secondary | ICD-10-CM | POA: Insufficient documentation

## 2012-03-20 DIAGNOSIS — R109 Unspecified abdominal pain: Secondary | ICD-10-CM

## 2012-03-20 DIAGNOSIS — C773 Secondary and unspecified malignant neoplasm of axilla and upper limb lymph nodes: Secondary | ICD-10-CM

## 2012-03-20 DIAGNOSIS — C180 Malignant neoplasm of cecum: Secondary | ICD-10-CM

## 2012-03-20 DIAGNOSIS — C50919 Malignant neoplasm of unspecified site of unspecified female breast: Secondary | ICD-10-CM | POA: Insufficient documentation

## 2012-03-20 NOTE — Telephone Encounter (Signed)
appts made and printed for pt,the breast center will call the pt for the dexa as their books are not open yet

## 2012-03-20 NOTE — Progress Notes (Signed)
This office note has been dictated.

## 2012-03-20 NOTE — Patient Instructions (Addendum)
Eileen [name]  [id number]   [facility] - AFTER VISIT SUMMARY   **RECOMMENDATIONS MADE BY THE CONSULTANT AND ANY TEST    RESULTS WILL BE SENT TO YOUR REFERRING DOCTORS.   YOUR EXAM FINDINGS, LABS AND RESULTS WERE DISCUSSED BY YOUR MD TODAY.    Your vital signs are: There were no vitals filed for this visit.  Your Updated drug allergies are: Allergies as of 03/20/2012 - Review Complete 02/21/2012  Allergen Reaction Noted  . Levofloxacin Other (See Comments) 11/05/2011    Your current list of medications are: Current Outpatient Prescriptions  Medication Sig Dispense Refill  . letrozole (FEMARA) 2.5 MG tablet Take 2.5 mg by mouth daily.        Marland Kitchen losartan (COZAAR) 25 MG tablet Take 25 mg by mouth daily.        . psyllium (METAMUCIL SMOOTH TEXTURE) 28 % packet Take 1 packet by mouth as directed.      . Vitamin D, Ergocalciferol, (DRISDOL) 50000 UNITS CAPS 50,000 Units as directed.       . vitamin E 100 UNIT capsule Take 100 Units by mouth daily.         INSTRUCTIONS GIVEN AND DISCUSSED:  See attached schedule   SPECIAL INSTRUCTIONS/FOLLOW-UP:  See above.  I acknowledge that I have been informed and understand all the instructions given to me and received a copy. I do not have any more questions at this time, but understand that I may call the [facility] at [phone] during business hours should I have any further questions or need assistance in obtaining follow-up care.

## 2012-03-23 NOTE — Progress Notes (Signed)
CC:   [name]. [name], MD [name], MD [name]. [name], M.D. [name]. [name], M.D. [name], M.D. [name], MD  IDENTIFYING STATEMENT:  The patient is a 66 year old woman with history of colon cancer and breast cancer who presents for followup.  HISTORY OF PRESENT ILLNESS:  [name] was last seen a year ago.  Recent concerns were over the summer she had persistent right upper quadrant discomfort with mildly elevated liver function tests.  On 01/21/2012 she had an ERCP and sphincterotomy.  No stones were found.  It was determined that she may have adhesions causing her symptoms.  She has had no loss in weight.  She has had no change in bowel function.  She does not have any new breast masses or lesions.  She received a CT scan of the chest, abdomen and pelvis on 03/18/2012. The chest scan showed small mediastinal lymph nodes not pathologically enlarged.  There were no pleural effusion.  No findings of malignancy in the thorax.  The abdomen and pelvis revealed an unremarkable liver, spleen, pancreas and adrenal gland.  There was a small umbilical hernia containing adipose tissue but no bowel.  There was no pathological retroperitoneal or porta hepatis adenopathy.  ALLERGIES:  Levofloxacin.  MEDICATIONS:  Femara 2.5 mg daily, Cozaar 25 mg daily, vitamin D 5000 units daily, vitamin E 100 units daily.  REVIEW OF SYSTEMS:  Intermittent mild right upper quadrant pain. Otherwise 10 point review of systems negative.  PAST MEDICAL HISTORY/FAMILY HISTORY/SOCIAL HISTORY:  Unchanged.  PHYSICAL EXAM:  General:  The patient is alert and oriented times 3. Vitals:  Pulse 68, blood pressure 167/93, temperature 97.6, respirations 20, weight 158.7 pounds.  HEENT:  Head is atraumatic, normocephalic. Sclerae anicteric.  Mouth moist.  Chest:  Clear.  CVS:  Unremarkable. Abdomen:  Soft, nontender.  Bowel sounds present.  Extremities:  No edema.  Breasts:  Exam notes no  palpable masses or nipple lesions.  No axillary adenopathy.  Lymph nodes:  No adenopathy.  CNS:  Nonfocal.  LABORATORY DATA:  On 03/18/2012 white cell count 6.4, hemoglobin 12.8, hematocrit 38.3, platelets 240.  Sodium 139, potassium 4.2, chloride 100, CO2 31, BUN 13, creatinine 0.9, glucose 92, T bilirubin 0.7, alkaline phosphatase 83, AST 37, ALT 14, calcium 9.1.  LDH 170, CA 1.6. CA27.29 33.  Results of CTs as in interval history.  IMPRESSION AND PLAN:  [name] is a 66 year old woman with: 1. 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  She is up-to-date with colonoscopy performed     in February 2013 and was unremarkable. 2. History of breast cancer status post lumpectomy with lymph node     dissection followed by [facility].  She received radiation and tamoxifen.     Had recurrence in left supraclavicular lymph node with tumor ER     negative, PR positive.  Went on to receive 4 cycles of     Adriamycin and Taxotere followed by stem cell transplant at [facility] on 08/16/1997.  She was placed on Femara and has been on     Femara since then. The patient was reassured as her current CT scans and tumor markers indicate no evidence of recurrence.  She will continue surveillance with plans to follow up  in a year's time.  She will follow up with labs and bone density testing.  She will continue to receive annual mammograms.    ______________________________ Laurice Record, M.D. LIO/MEDQ  D:  03/20/2012  T:  03/20/2012  Job:  [id number]

## 2012-03-25 ENCOUNTER — Other Ambulatory Visit: Payer: Self-pay | Admitting: *Deleted

## 2012-03-25 ENCOUNTER — Telehealth (INDEPENDENT_AMBULATORY_CARE_PROVIDER_SITE_OTHER): Payer: Self-pay | Admitting: General Surgery

## 2012-03-25 NOTE — Telephone Encounter (Signed)
Called patient back and advised The CT was ordered by Dr. Dalene Carrow, thus she needs to contact her office regarding the next step. Patient stated she has an appointment to see her this Friday. Advised her that she has not seen Dr. [name] since April, also he did not order the test to be performed. Advised that if Dr. Dalene Carrow feels she needs to be seen by Dr. [name], she would either send a message to him to advise, or refer her back to our office. Patient stated she will contact Dr. Dalene Carrow.

## 2012-03-26 ENCOUNTER — Telehealth: Payer: Self-pay | Admitting: *Deleted

## 2012-03-26 NOTE — Telephone Encounter (Signed)
Spoke with pt and informed pt re:  Per Dr. Dalene Carrow ,  Pt was instructed to call her surgeon Dr. [name] if pt develops any symptoms - pain, discomfort for further evaluation of her umbilical hernia from recent scans.   Informed pt that md did not refer pt to surgeon as she is already an established pt with Dr. [name].   Pt voiced understanding.

## 2012-03-27 ENCOUNTER — Telehealth (INDEPENDENT_AMBULATORY_CARE_PROVIDER_SITE_OTHER): Payer: Self-pay | Admitting: General Surgery

## 2012-03-31 ENCOUNTER — Telehealth (INDEPENDENT_AMBULATORY_CARE_PROVIDER_SITE_OTHER): Payer: Self-pay | Admitting: General Surgery

## 2012-03-31 NOTE — Telephone Encounter (Signed)
Called patient back, advised her of what Dr. [name] stated "no obvious surgical problem on CT". Offered an appointment to be seen. Patient declined and said that she will wait. Patient asked about working out at the gym, advised he to use caution when she works out, especially stomach exercises and to have a day of rest in between. Patient agreed and will call to set up an appointment if her situation gets worse.

## 2012-05-26 ENCOUNTER — Other Ambulatory Visit: Payer: Self-pay | Admitting: Hematology and Oncology

## 2012-05-26 DIAGNOSIS — Z1231 Encounter for screening mammogram for malignant neoplasm of breast: Secondary | ICD-10-CM

## 2012-07-01 ENCOUNTER — Other Ambulatory Visit: Payer: Self-pay | Admitting: Dermatology

## 2012-07-01 DIAGNOSIS — D485 Neoplasm of uncertain behavior of skin: Secondary | ICD-10-CM | POA: Diagnosis not present

## 2012-07-01 DIAGNOSIS — L820 Inflamed seborrheic keratosis: Secondary | ICD-10-CM | POA: Diagnosis not present

## 2012-07-07 ENCOUNTER — Ambulatory Visit
Admission: RE | Admit: 2012-07-07 | Discharge: 2012-07-07 | Disposition: A | Discharge status: Home / Self Care | Payer: Medicare Other | Source: Ambulatory Visit | Attending: Hematology and Oncology | Admitting: Hematology and Oncology

## 2012-07-07 DIAGNOSIS — Z1231 Encounter for screening mammogram for malignant neoplasm of breast: Secondary | ICD-10-CM | POA: Diagnosis not present

## 2012-07-08 DIAGNOSIS — H409 Unspecified glaucoma: Secondary | ICD-10-CM | POA: Diagnosis not present

## 2012-07-08 DIAGNOSIS — H4011X0 Primary open-angle glaucoma, stage unspecified: Secondary | ICD-10-CM | POA: Diagnosis not present

## 2012-08-12 ENCOUNTER — Telehealth (INDEPENDENT_AMBULATORY_CARE_PROVIDER_SITE_OTHER): Payer: Self-pay | Admitting: General Surgery

## 2012-08-12 NOTE — Telephone Encounter (Signed)
Called patient back based on message left and advised her that I would send a message to the cancer center ([facility]) in order for them to contact her to advise who her new oncology doctor is. Patient stated that Dr. Dalene Carrow has left and she did not know who she can see going forward. I advised her the cancer center is on the same network we are and has access to her history with Dr.Ingram. Patient agreed.

## 2012-08-25 ENCOUNTER — Encounter: Payer: Self-pay | Admitting: Oncology

## 2012-08-25 ENCOUNTER — Telehealth: Payer: Self-pay | Admitting: Oncology

## 2012-08-25 NOTE — Telephone Encounter (Signed)
Former pt of LO reassigned to [employer] per email from [facility]. S/w pt today re reassignment and appts for 8/18 lb and 8/20 FS. Letter mailed and pt aware to call if she has any questions/concerns prior to August appts.

## 2012-08-27 DIAGNOSIS — E559 Vitamin D deficiency, unspecified: Secondary | ICD-10-CM | POA: Diagnosis not present

## 2012-09-28 ENCOUNTER — Encounter (HOSPITAL_COMMUNITY): Payer: Self-pay | Admitting: Emergency Medicine

## 2012-09-28 ENCOUNTER — Emergency Department (HOSPITAL_COMMUNITY)
Admission: EM | Admit: 2012-09-28 | Discharge: 2012-09-28 | Disposition: A | Discharge status: Home / Self Care | Payer: Medicare Other | Attending: Emergency Medicine | Admitting: Emergency Medicine

## 2012-09-28 DIAGNOSIS — R55 Syncope and collapse: Secondary | ICD-10-CM | POA: Insufficient documentation

## 2012-09-28 DIAGNOSIS — Z85038 Personal history of other malignant neoplasm of large intestine: Secondary | ICD-10-CM | POA: Diagnosis not present

## 2012-09-28 DIAGNOSIS — Z8679 Personal history of other diseases of the circulatory system: Secondary | ICD-10-CM | POA: Insufficient documentation

## 2012-09-28 DIAGNOSIS — I10 Essential (primary) hypertension: Secondary | ICD-10-CM | POA: Insufficient documentation

## 2012-09-28 DIAGNOSIS — Z853 Personal history of malignant neoplasm of breast: Secondary | ICD-10-CM | POA: Diagnosis not present

## 2012-09-28 DIAGNOSIS — Z79899 Other long term (current) drug therapy: Secondary | ICD-10-CM | POA: Diagnosis not present

## 2012-09-28 DIAGNOSIS — R197 Diarrhea, unspecified: Secondary | ICD-10-CM | POA: Diagnosis not present

## 2012-09-28 DIAGNOSIS — Z87891 Personal history of nicotine dependence: Secondary | ICD-10-CM | POA: Diagnosis not present

## 2012-09-28 DIAGNOSIS — R404 Transient alteration of awareness: Secondary | ICD-10-CM | POA: Diagnosis not present

## 2012-09-28 LAB — BASIC METABOLIC PANEL
BUN: 17 mg/dL (ref 6–23)
CO2: 22 mEq/L (ref 19–32)
Calcium: 8.8 mg/dL (ref 8.4–10.5)
Chloride: 97 mEq/L (ref 96–112)
Creatinine, Ser: 0.72 mg/dL (ref 0.50–1.10)
GFR calc Af Amer: >90 mL/min (ref >90)
GFR calc non Af Amer: 88 mL/min — ABNORMAL LOW (ref >90)
Glucose, Bld: 153 mg/dL — ABNORMAL HIGH (ref 70–99)
Potassium: 3.2 mEq/L — ABNORMAL LOW (ref 3.5–5.1)
Sodium: 130 mEq/L — ABNORMAL LOW (ref 135–145)

## 2012-09-28 LAB — CBC
HCT: 38.5 % (ref 36.0–46.0)
Hemoglobin: 12.9 g/dL (ref 12.0–15.0)
MCH: 28.5 pg (ref 26.0–34.0)
MCHC: 33.5 g/dL (ref 30.0–36.0)
MCV: 85 fL (ref 78.0–100.0)
Platelets: 204 10*3/uL (ref 150–400)
RBC: 4.53 MIL/uL (ref 3.87–5.11)
RDW: 12.7 % (ref 11.5–15.5)
WBC: 8.5 10*3/uL (ref 4.0–10.5)

## 2012-09-28 MED ORDER — PANTOPRAZOLE SODIUM 40 MG IV SOLR
40.0000 mg | Freq: Once | INTRAVENOUS | Status: AC
  Administered 2012-09-28: 40 mg via INTRAVENOUS
  Filled 2012-09-28: qty 40

## 2012-09-28 MED ORDER — SODIUM CHLORIDE 0.9 % IV BOLUS (SEPSIS)
1000.0000 mL | Freq: Once | INTRAVENOUS | Status: AC
  Administered 2012-09-28: 1000 mL via INTRAVENOUS

## 2012-09-28 MED ORDER — DIPHENOXYLATE-ATROPINE 2.5-0.025 MG PO TABS: 1.0000 | ORAL_TABLET | Freq: Four times a day (QID) | ORAL | Status: DC | PRN

## 2012-09-28 MED ORDER — ONDANSETRON HCL 4 MG/2ML IJ SOLN
4.0000 mg | Freq: Once | INTRAMUSCULAR | Status: AC
  Administered 2012-09-28: 4 mg via INTRAVENOUS
  Filled 2012-09-28: qty 2

## 2012-09-28 MED ORDER — PROMETHAZINE HCL 25 MG PO TABS: 25.0000 mg | ORAL_TABLET | Freq: Four times a day (QID) | ORAL | Status: DC | PRN

## 2012-09-28 NOTE — ED Provider Notes (Signed)
History     CSN: [id number]  Arrival date & time 09/28/12  0202   First MD Initiated Contact with Patient 09/28/12 [phone]      Chief Complaint  Patient presents with  . Diarrhea  . Loss of Consciousness    (Consider location/radiation/quality/duration/timing/severity/associated sxs/prior treatment) HPI.... multiple episodes of diarrhea since mid afternoon on Sunday. Patient feels dehydrated. She apparently passed out in the bathroom at home. No blood or mucus in diarrhea. Severity is moderate. Nothing makes symptoms better or worse. No fever, sweats, chills.  Past Medical History  Diagnosis Date  . HEMORRHOIDS   . Hypertension   . CARCINOMA, BREAST 1995  . COLON CANCER 2006    T3 N0 tumor    Past Surgical History  Procedure Laterality Date  . Bone marrow transplant  1999  . Total abdominal hysterectomy w/ bilateral salpingoophorectomy  1996  . Hemicolectomy  01/2005    right  . Breast lumpectomy      left breast  . Inguinal hernia repair    . Ercp  01/21/2012    Procedure: ENDOSCOPIC RETROGRADE CHOLANGIOPANCREATOGRAPHY (ERCP);  Surgeon: Dora M Brodie, MD;  Location: [facility];  Service: Endoscopy;  Laterality: [name];    Family History  Problem Relation Age of Onset  . Uterine cancer Mother   . Heart attack Father   . Colon cancer Neg Hx   . Esophageal cancer Neg Hx   . Stomach cancer Neg Hx   . Rectal cancer Neg Hx     History  Substance Use Topics  . Smoking status: Former Smoker    Quit date: 12/20/1990  . Smokeless tobacco: Never Used  . Alcohol Use: No    OB History   Grav Para Term Preterm Abortions TAB SAB Ect Mult Living                  Review of Systems  All other systems reviewed and are negative.    Allergies  Levofloxacin  Home Medications   Current Outpatient Rx  Name  Route  Sig  Dispense  Refill  . letrozole (FEMARA) 2.5 MG tablet   Oral   Take 2.5 mg by mouth daily.           . losartan (COZAAR) 25 MG tablet   Oral   Take  25 mg by mouth daily.           . Vitamin D, Ergocalciferol, (DRISDOL) 50000 UNITS CAPS      50 ,000 Units as directed.            BP 140/64  Pulse 82  Temp(Src) 97.4 F (36.3 C) (Oral)  Resp 20  SpO2 99%  Physical Exam  Nursing note and vitals reviewed. Constitutional: She is oriented to person, place, and time. She appears well-developed and well-nourished.  HENT:  Head: Normocephalic and atraumatic.  Eyes: Conjunctivae and EOM are normal. Pupils are equal, round, and reactive to light.  Neck: Normal range of motion. Neck supple.  Cardiovascular: Normal rate, regular rhythm and normal heart sounds.   Pulmonary/Chest: Effort normal and breath sounds normal.  Abdominal: Soft. Bowel sounds are normal.  Minimal diffuse abdominal tenderness  Musculoskeletal: Normal range of motion.  Neurological: She is alert and oriented to person, place, and time.  Skin: Skin is warm and dry.  Psychiatric: She has a normal mood and affect.    ED Course  Procedures (including critical care time)  Labs Reviewed  BASIC METABOLIC PANEL - Abnormal; Notable for the  following:    Sodium 130 (*)    Potassium 3.2 (*)    Glucose, Bld 153 (*)    GFR calc non Af Amer 88 (*)    All other components within normal limits  CBC   No results found.   No diagnosis found.   Date: 09/28/2012  Rate: 64  Rhythm: normal sinus rhythm  QRS Axis: normal  Intervals: normal  ST/T Wave abnormalities: normal  Conduction Disutrbances: none  Narrative Interpretation: unremarkable     MDM  Suspect patient became volume depleted. She feels much better after 2 L of IV fluid. Discharge meds include Lomotil and Phenergan.  Do not think syncope was from cardiac source.        Donnetta Hutching, MD 09/28/12 [phone]

## 2012-09-28 NOTE — ED Notes (Signed)
Pt reports feeling better

## 2012-09-28 NOTE — Discharge Instructions (Signed)
Increase liquids.  Medication for nausea and diarrhea. Rest

## 2012-09-28 NOTE — ED Notes (Signed)
Pt c/o diarrhea 5-6 times onset 1700 yesterday. Per husband pt has syncopal episode while in bathroom just prior to arrival, husband states pt was very diaphoretic. Denies injury.  Pt A & O. PWD. Pt hyperventilating in triage.

## 2012-11-09 ENCOUNTER — Ambulatory Visit (INDEPENDENT_AMBULATORY_CARE_PROVIDER_SITE_OTHER): Payer: Medicare Other | Admitting: General Surgery

## 2012-11-09 ENCOUNTER — Encounter (INDEPENDENT_AMBULATORY_CARE_PROVIDER_SITE_OTHER): Payer: Self-pay | Admitting: General Surgery

## 2012-11-09 VITALS — BP 128/80 | HR 68 | Temp 97.3°F | Resp 20 | Ht 65.0 in | Wt 144.0 lb

## 2012-11-09 DIAGNOSIS — Z85038 Personal history of other malignant neoplasm of large intestine: Secondary | ICD-10-CM

## 2012-11-09 DIAGNOSIS — C50919 Malignant neoplasm of unspecified site of unspecified female breast: Secondary | ICD-10-CM

## 2012-11-09 DIAGNOSIS — Z853 Personal history of malignant neoplasm of breast: Secondary | ICD-10-CM | POA: Diagnosis not present

## 2012-11-09 DIAGNOSIS — C189 Malignant neoplasm of colon, unspecified: Secondary | ICD-10-CM

## 2012-11-09 NOTE — Patient Instructions (Signed)
Your physical exam today shows no abnormality of the breast, abdomen, or lymph nodes. There is no evidence of cancer.  Your mammograms in December were also normal.  Keep her appointment with Dr. Clelia Croft in August of this year.  Be sure to get bilateral mammograms in December of this year  You should get repeat colonoscopy in 2016.  Return to see Dr. [name] in one year.

## 2012-11-09 NOTE — Progress Notes (Signed)
Patient ID: Eileen [name], female   DOB: 03-20-1946, 67 y.o.   MRN: [id number]  Chief Complaint  Patient presents with  . Breast Cancer Long Term Follow Up    yearly br check    HPI Eileen [name] is a 67 y.o. female.  She returns for long-term followup regarding her breast cancer and colon cancer.   In 1995 she presented with a small cancer in the left breast. She underwent left partial mastectomy and axillary lymph node dissection. This was a 1.5 cm tumor,stage TI C. N0. Dr. Donnie Coffin was involved in her care. She recurred in the left supraclavicular nodes and I biopsied that node, the Hickman catheter in her, and she went to dig and had a stem cell transplant. Today she has no known regional or metastatic breast disease.Recent bilateral mammograms in December 2013 looked good, no focal abnormality. She gets mammograms annually.   In 2006 she developed a right colon cancer. I performed a right colectomy for what turned out to be a stage T3 N0 tumor. She was followed by Dr. Dalene Carrow who did her lab work. She is now reassigned to Dr. Clelia Croft and is scheduled to see him in August of 2014. Colonoscopy performed on September 17, 2011 by Dr. [name] is normal with no evidence of disease.   She has no complaints about her digestive function and no complaints about her breast. Her weight has been stable. She exercises daily and goes to the gym daily. She was worked up for right upper quadrant pain by Dr. [name]. ERCP was negative. She still has this pain but is not very frequent anymore.   Denies chest wall tenderness.  She basically feels fine and has no new complaints. Denies left arm swelling or numbness.She is followed by Dr. Leonides Sake for primary care. HPI  Past Medical History  Diagnosis Date  . HEMORRHOIDS   . Hypertension   . CARCINOMA, BREAST 1995  . COLON CANCER 2006    T3 N0 tumor    Past Surgical History  Procedure Laterality Date  . Bone marrow transplant  1999  . Total abdominal  hysterectomy w/ bilateral salpingoophorectomy  1996  . Hemicolectomy  01/2005    right  . Breast lumpectomy      left breast  . Inguinal hernia repair    . Ercp  01/21/2012    Procedure: ENDOSCOPIC RETROGRADE CHOLANGIOPANCREATOGRAPHY (ERCP);  Surgeon: Hart Carwin, MD;  Location: [facility];  Service: Endoscopy;  Laterality: [name];    Family History  Problem Relation Age of Onset  . Uterine cancer Mother   . Heart attack Father   . Colon cancer Neg Hx   . Esophageal cancer Neg Hx   . Stomach cancer Neg Hx   . Rectal cancer Neg Hx     Social History History  Substance Use Topics  . Smoking status: Former Smoker    Quit date: 12/20/1990  . Smokeless tobacco: Never Used  . Alcohol Use: No    Allergies  Allergen Reactions  . Levofloxacin Other (See Comments)    Red streaks to IV site when IV dose infusing and arm feeling very irritated, throbbing, painful    Current Outpatient Prescriptions  Medication Sig Dispense Refill  . letrozole (FEMARA) 2.5 MG tablet Take 2.5 mg by mouth daily.        Marland Kitchen losartan (COZAAR) 25 MG tablet Take 25 mg by mouth daily.        . Vitamin D, Ergocalciferol, (DRISDOL) 50000 UNITS CAPS 50,000  Units as directed.        No current facility-administered medications for this visit.    Review of Systems Review of Systems  Constitutional: Negative for fever, chills and unexpected weight change.  HENT: Negative for hearing loss, congestion, sore throat, trouble swallowing and voice change.   Eyes: Negative for visual disturbance.  Respiratory: Negative for cough and wheezing.   Cardiovascular: Negative for chest pain, palpitations and leg swelling.  Gastrointestinal: Positive for abdominal pain. Negative for nausea, vomiting, diarrhea, constipation, blood in stool, abdominal distention and anal bleeding.  Genitourinary: Negative for hematuria, vaginal bleeding and difficulty urinating.  Musculoskeletal: Negative for arthralgias.  Skin: Negative for  rash and wound.  Neurological: Negative for seizures, syncope and headaches.  Hematological: Negative for adenopathy. Does not bruise/bleed easily.  Psychiatric/Behavioral: Negative for confusion.    Blood pressure 128/80, pulse 68, temperature 97.3 F (36.3 C), temperature source Temporal, resp. rate 20, height 5\' 5"  (1.651 m), weight 144 lb (65.318 kg).  Physical Exam Physical Exam  Constitutional: She is oriented to person, place, and time. She appears well-developed and well-nourished. No distress.  HENT:  Head: Normocephalic and atraumatic.  Nose: Nose normal.  Mouth/Throat: No oropharyngeal exudate.  Eyes: Conjunctivae and EOM are normal. Pupils are equal, round, and reactive to light. Left eye exhibits no discharge. No scleral icterus.  Neck: Neck supple. No JVD present. No tracheal deviation present. No thyromegaly present.  Cardiovascular: Normal rate, regular rhythm, normal heart sounds and intact distal pulses.   No murmur heard. Pulmonary/Chest: Effort normal and breath sounds normal. No respiratory distress. She has no wheezes. She has no rales. She exhibits no tenderness.  Lumpectomy scar left breast, inferiorly, well healed. No mass in either breast. No axillary adenopathy bilaterally. Well-healed left axillary scar. No arm swelling.  Abdominal: Soft. Bowel sounds are normal. She exhibits no distension and no mass. There is no tenderness. There is no rebound and no guarding.  Well healed right transverse scar. Nontender. No hernia. Abdominal exam otherwise benign.  Musculoskeletal: She exhibits no edema and no tenderness.  Lymphadenopathy:    She has no cervical adenopathy.  Neurological: She is alert and oriented to person, place, and time. She exhibits normal muscle tone. Coordination normal.  Skin: Skin is warm. No rash noted. She is not diaphoretic. No erythema. No pallor.  Psychiatric: She has a normal mood and affect. Her behavior is normal. Judgment and thought  content normal.    Data Reviewed Cancer center notes. Recent mammograms. [facility] notes.  Assessment    History stage I cancer left breast, status post left partial mastectomy and axillary lymph node dissection 1995.  Status post metastatic breast cancer to left supraclavicular area, to stem cell transplant and chemotherapy. No evidence of recurrent disease at this time  History adenocarcinoma right colon, stage TIII N0, no evidence of recurrence 8 years postop right colectomy, 2006.     Plan    She will see Dr. Clelia Croft in August 2014.  Continue Femara unless he changes that.  Colonoscopy 2016, Dr. [name]  Bilateral mammogram of December 2014  Return to see me in one year        Thos Matsumoto M. [name], M.D., [facility] General and Minimally invasive Surgery Breast and Colorectal Surgery Office:   [phone] Pager:   [phone]  11/09/2012, 10:45 AM

## 2013-01-22 ENCOUNTER — Encounter: Payer: Self-pay | Admitting: *Deleted

## 2013-01-26 ENCOUNTER — Ambulatory Visit (INDEPENDENT_AMBULATORY_CARE_PROVIDER_SITE_OTHER): Payer: Medicare Other | Admitting: Obstetrics & Gynecology

## 2013-01-26 ENCOUNTER — Encounter: Payer: Self-pay | Admitting: Obstetrics & Gynecology

## 2013-01-26 VITALS — BP 160/82 | HR 60 | Resp 16 | Ht 64.5 in | Wt 143.8 lb

## 2013-01-26 DIAGNOSIS — Z01419 Encounter for gynecological examination (general) (routine) without abnormal findings: Secondary | ICD-10-CM

## 2013-01-26 DIAGNOSIS — Z124 Encounter for screening for malignant neoplasm of cervix: Secondary | ICD-10-CM | POA: Diagnosis not present

## 2013-01-26 NOTE — Patient Instructions (Addendum)

## 2013-01-26 NOTE — Progress Notes (Addendum)
67 y.o. G3P2 MarriedCaucasianF here for annual exam.  Two sons are doing well.  Son with history of colon cancer in [place] has moved to [place].  He is 45 and has a 2 1/2 daughter.  Other son is 57.  He had a negative colonoscopy.  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  He is having repeat testing this summer.  Has appointment at cancer center in October.  Had a bad episode of diarrhea in March.  Had syncopal episode and she was evaluated in ER at [facility].  Did not hit her head.    Patient's last menstrual period was 07/30/1995.          Sexually active: no  The current method of family planning is status post hysterectomy.    Exercising: yes   Smoker:  no  Health Maintenance: Pap:  11/28/10 WNL History of abnormal Pap:  no MMG:  05/26/12 normal Colonoscopy:  2/13 Dr. [name], repeat in 3 years BMD:   2012 TDaP:  01/02/12 Screening Labs: PCP, Hb today: PCP, Urine today: PCP   reports that she quit smoking about 22 years ago. She has never used smokeless tobacco. She reports that  drinks alcohol. She reports that she does not use illicit drugs.  Past Medical History  Diagnosis Date  . HEMORRHOIDS   . Hypertension   . CARCINOMA, BREAST 1995  . COLON CANCER 2006    T3 N0 tumor  . Anemia   . Axillary mass 7/99    Left  . Cervical spinal stenosis     Past Surgical History  Procedure Laterality Date  . Bone marrow transplant  1999  . Total abdominal hysterectomy w/ bilateral salpingoophorectomy  1996  . Hemicolectomy  01/2005    right  . Breast lumpectomy      left breast  . Inguinal hernia repair    . Ercp  01/21/2012    Procedure: ENDOSCOPIC RETROGRADE CHOLANGIOPANCREATOGRAPHY (ERCP);  Surgeon: Hart Carwin, MD;  Location: [facility];  Service: Endoscopy;  Laterality: [name];  . Neck mass excision Left   . Hysteroscopy  08/30/94  . Colonoscopy    . Tunneled venous catheter placement  9/98  . Bunionectomy  3/09, 10/09    right and left    Current  Outpatient Prescriptions  Medication Sig Dispense Refill  . letrozole (FEMARA) 2.5 MG tablet Take 2.5 mg by mouth daily.        Marland Kitchen losartan (COZAAR) 50 MG tablet Take 50 mg by mouth daily.      . Vitamin D, Ergocalciferol, (DRISDOL) 50000 UNITS CAPS 50,000 Units as directed. Once weekly       No current facility-administered medications for this visit.    Family History  Problem Relation Age of Onset  . Uterine cancer Mother   . Heart attack Father   . Colon cancer Neg Hx   . Esophageal cancer Neg Hx   . Stomach cancer Neg Hx   . Rectal cancer Neg Hx     ROS:  Pertinent items are noted in HPI.  Otherwise, a comprehensive ROS was negative.  Exam:   BP 160/82  Pulse 60  Resp 16  Ht 5' 4.5" (1.638 m)  Wt 143 lb 12.8 oz (65.227 kg)  BMI 24.31 kg/m2  LMP 07/30/1995  Weight change: +6  Height: 5' 4.5" (163.8 cm)  Ht Readings from Last 3 Encounters:  01/26/13 5' 4.5" (1.638 m)  11/09/12 5\' 5"  (1.651 m)  03/20/12 5\' 5"  (  1.651 m)    General appearance: alert, cooperative and appears stated age Head: Normocephalic, without obvious abnormality, atraumatic Neck: no adenopathy, supple, symmetrical, trachea midline and thyroid normal to inspection and palpation Lungs: clear to auscultation bilaterally Breasts: normal right breast, well healed scars left breast, no masses Heart: regular rate and rhythm Abdomen: soft, non-tender; bowel sounds normal; no masses,  no organomegaly Extremities: extremities normal, atraumatic, no cyanosis or edema Skin: Skin color, texture, turgor normal. No rashes or lesions Lymph nodes: Cervical, supraclavicular, and axillary nodes normal. No abnormal inguinal nodes palpated Neurologic: Grossly normal   Pelvic: External genitalia:  no lesions              Urethra:  normal appearing urethra with no masses, tenderness or lesions              Bartholins and Skenes: normal                 Vagina: normal appearing vagina with normal color and discharge, no  lesions              Cervix: no lesions              Pap taken: yes Bimanual Exam:  Uterus:  uterus absent              Adnexa: no mass, fullness, tenderness               Rectovaginal: Confirms               Anus:  normal sphincter tone, no lesions  A:  Well Woman with normal exam H/O TAH/BSO No HRT H/O colon cancer s/p hemicolectomy 7/06.  Colonoscopy every three years. H/O breast cancer with recurrence 1995/1998 Vit D deficiency.  Followed by PCP.  P:   Mammogram yearly. pap smear every other year.  Done today. Labs with pcp every six months. return annually or prn  An After Visit Summary was printed and given to the patient.

## 2013-01-28 LAB — IPS PAP SMEAR ONLY

## 2013-03-12 ENCOUNTER — Other Ambulatory Visit: Payer: Self-pay | Admitting: Oncology

## 2013-03-12 DIAGNOSIS — C189 Malignant neoplasm of colon, unspecified: Secondary | ICD-10-CM

## 2013-03-15 ENCOUNTER — Other Ambulatory Visit (HOSPITAL_BASED_OUTPATIENT_CLINIC_OR_DEPARTMENT_OTHER): Payer: Medicare Other

## 2013-03-15 DIAGNOSIS — C189 Malignant neoplasm of colon, unspecified: Secondary | ICD-10-CM | POA: Diagnosis not present

## 2013-03-15 DIAGNOSIS — C180 Malignant neoplasm of cecum: Secondary | ICD-10-CM

## 2013-03-15 DIAGNOSIS — C50919 Malignant neoplasm of unspecified site of unspecified female breast: Secondary | ICD-10-CM | POA: Diagnosis not present

## 2013-03-15 LAB — CBC WITH DIFFERENTIAL/PLATELET
BASO%: 0.6 % (ref 0.0–2.0)
Basophils Absolute: 0 10*3/uL (ref 0.0–0.1)
EOS%: 1 % (ref 0.0–7.0)
Eosinophils Absolute: 0.1 10*3/uL (ref 0.0–0.5)
HCT: 40 % (ref 34.8–46.6)
HGB: 13.2 g/dL (ref 11.6–15.9)
LYMPH%: 16.4 % (ref 14.0–49.7)
MCH: 28.7 pg (ref 25.1–34.0)
MCHC: 33 g/dL (ref 31.5–36.0)
MCV: 87 fL (ref 79.5–101.0)
MONO#: 0.5 10*3/uL (ref 0.1–0.9)
MONO%: 7.9 % (ref 0.0–14.0)
NEUT#: 5 10*3/uL (ref 1.5–6.5)
NEUT%: 74.1 % (ref 38.4–76.8)
Platelets: 211 10*3/uL (ref 145–400)
RBC: 4.6 10*6/uL (ref 3.70–5.45)
RDW: 13.1 % (ref 11.2–14.5)
WBC: 6.8 10*3/uL (ref 3.9–10.3)
lymph#: 1.1 10*3/uL (ref 0.9–3.3)

## 2013-03-15 LAB — COMPREHENSIVE METABOLIC PANEL (CC13)
ALT: 21 U/L (ref 0–55)
AST: 18 U/L (ref 5–34)
Albumin: 3.6 g/dL (ref 3.5–5.0)
Alkaline Phosphatase: 69 U/L (ref 40–150)
BUN: 16.6 mg/dL (ref 7.0–26.0)
CO2: 28 mEq/L (ref 22–29)
Calcium: 9.6 mg/dL (ref 8.4–10.4)
Chloride: 108 mEq/L (ref 98–109)
Creatinine: 0.8 mg/dL (ref 0.6–1.1)
Glucose: 73 mg/dl (ref 70–140)
Potassium: 4.5 mEq/L (ref 3.5–5.1)
Sodium: 145 mEq/L (ref 136–145)
Total Bilirubin: 0.52 mg/dL (ref 0.20–1.20)
Total Protein: 6.7 g/dL (ref 6.4–8.3)

## 2013-03-16 LAB — CEA: CEA: 1.9 ng/mL (ref 0.0–5.0)

## 2013-03-17 ENCOUNTER — Telehealth: Payer: Self-pay | Admitting: Oncology

## 2013-03-17 ENCOUNTER — Ambulatory Visit: Payer: Managed Care, Other (non HMO) | Admitting: Hematology and Oncology

## 2013-03-17 ENCOUNTER — Other Ambulatory Visit: Payer: Self-pay | Admitting: Oncology

## 2013-03-17 ENCOUNTER — Ambulatory Visit (HOSPITAL_BASED_OUTPATIENT_CLINIC_OR_DEPARTMENT_OTHER): Payer: Medicare Other | Admitting: Oncology

## 2013-03-17 VITALS — BP 139/81 | HR 71 | Temp 97.9°F | Resp 18 | Ht 64.0 in | Wt 146.4 lb

## 2013-03-17 DIAGNOSIS — C189 Malignant neoplasm of colon, unspecified: Secondary | ICD-10-CM

## 2013-03-17 DIAGNOSIS — Z8509 Personal history of malignant neoplasm of other digestive organs: Secondary | ICD-10-CM

## 2013-03-17 DIAGNOSIS — C50919 Malignant neoplasm of unspecified site of unspecified female breast: Secondary | ICD-10-CM | POA: Diagnosis not present

## 2013-03-17 NOTE — Telephone Encounter (Signed)
GV AND PRITNED APPT SCHED AND AVS FOR PT.

## 2013-03-17 NOTE — Progress Notes (Signed)
Hematology and Oncology Follow Up Visit  Eileen [name] [id number] 1946/06/28 67 y.o. 03/17/2013 10:23 AM Eileen [name], MDHarris, Eileen Noa, MD   Principle Diagnosis:  This is a 67 year old woman with the following diagnoses: 1. Breast cancer diagnosed in 1995 she had a recurrence in 1999 and was treated with stem cell transplant and currently in remission. 2. Colon cancer diagnosed in 2006 status post resection adjuvant therapy and currently in remission. She was found to have stage IIA disease.   Prior Therapy: 1. She is status post lumpectomy and lymph node dissection followed by [facility] and radiation therapy. 2. She is status post 4 cycles of Adriamycin and Taxotere followed by stem cell transplantation at [facility] on 08/16/1997 due to recurrence and the left supraclavicular lymph node. Her tumor is ER negative PR positive. 3. She is status post right colectomy for stage IIA adenocarcinoma of the colon in October of 2006. She is status post 10 cycles of FOLFOX concluded in 06/2005  Current therapy: Femara for the last 13 years.  Interim History:  This is a pleasant 67 year old woman presents today for a followup visit given her history of both breast cancer and colon cancer. Since her last visit, she is feeling well without any symptoms. She has not reported any illnesses or hospitalizations. She had not reported any abdominal pain, nausea, vomiting, diarrhea, or change in her bowel habits. She is up to date on her colonoscopies and mammograms. She is not reporting any complications from the Femara at this time.  Medications: I have reviewed the patient's current medications.  Current Outpatient Prescriptions  Medication Sig Dispense Refill  . letrozole (FEMARA) 2.5 MG tablet Take 2.5 mg by mouth daily.        Marland Kitchen losartan (COZAAR) 50 MG tablet Take 50 mg by mouth daily.      . Vitamin D, Ergocalciferol, (DRISDOL) 50000 UNITS CAPS 50,000 Units as directed. Once weekly       No  current facility-administered medications for this visit.     Allergies:  Allergies  Allergen Reactions  . Levofloxacin Other (See Comments)    Red streaks to IV site when IV dose infusing and arm feeling very irritated, throbbing, painful    Past Medical History, Surgical history, Social history, and Family History were reviewed and updated.  Review of Systems:  Remaining ROS negative. Physical Exam: Blood pressure 139/81, pulse 71, temperature 97.9 F (36.6 C), temperature source Oral, resp. rate 18, height 5\' 4"  (1.626 m), weight 146 lb 6.4 oz (66.407 kg), last menstrual period 07/30/1995, SpO2 95.00%. ECOG: 0 General appearance: alert Head: Normocephalic, without obvious abnormality, atraumatic Neck: no adenopathy, no carotid bruit, no JVD, supple, symmetrical, trachea midline and thyroid not enlarged, symmetric, no tenderness/mass/nodules Lymph nodes: Cervical, supraclavicular, and axillary nodes normal. Heart:regular rate and rhythm, S1, S2 normal, no murmur, click, rub or gallop Lung:chest clear, no wheezing, rales, normal symmetric air entry Abdomin: soft, non-tender, without masses or organomegaly EXT:no erythema, induration, or nodules   Lab Results: Lab Results  Component Value Date   WBC 6.8 03/15/2013   HGB 13.2 03/15/2013   HCT 40.0 03/15/2013   MCV 87.0 03/15/2013   PLT 211 03/15/2013     Chemistry      Component Value Date/Time   NA 145 03/15/2013 1014   NA 130* 09/28/2012 0231   NA 139 03/18/2012 1422   K 4.5 03/15/2013 1014   K 3.2* 09/28/2012 0231   K 4.2 03/18/2012 1422   CL 97 09/28/2012 0231  CL 100 03/18/2012 1422   CO2 28 03/15/2013 1014   CO2 22 09/28/2012 0231   CO2 31 03/18/2012 1422   BUN 16.6 03/15/2013 1014   BUN 17 09/28/2012 0231   BUN 13 03/18/2012 1422   CREATININE 0.8 03/15/2013 1014   CREATININE 0.72 09/28/2012 0231   CREATININE 0.9 03/18/2012 1422      Component Value Date/Time   CALCIUM 9.6 03/15/2013 1014   CALCIUM 8.8 09/28/2012 0231   CALCIUM  9.1 03/18/2012 1422   ALKPHOS 69 03/15/2013 1014   ALKPHOS 83 03/18/2012 1422   ALKPHOS 58 12/02/2011 1020   AST 18 03/15/2013 1014   AST 37 03/18/2012 1422   AST 21 12/02/2011 1020   ALT 21 03/15/2013 1014   ALT 40 03/18/2012 1422   ALT 25 12/02/2011 1020   BILITOT 0.52 03/15/2013 1014   BILITOT 0.70 03/18/2012 1422   BILITOT 0.9 12/02/2011 1020       Impression and Plan:  67 year old woman with the following issues:  1. Colon cancer diagnosed in 2006 and she is status post surgical resection followed by adjuvant FOLFOX chemotherapy that concluded in December of 2006. Her initial staging at stage Louanne Belton has very little risk of relapse at this point. Her examination as well as last CT scan from August of 2013 did not show any evidence to suggest recurrence of her disease. At this time we'll continue active surveillance with clinical visit on an annual  Basis.   2. Breast cancer: Diagnosed in 1995 was treated with a lumpectomy and radiation followed by adjuvant chemotherapy. She had relapse in 1999 and was treated with high dose chemotherapy and stem cell transplant. She has no evidence of relapse disease and continued to be on Femara at this time. The risks and benefits of continuing this drug were discussed today. It is reasonable to extrapolate further benefit beyond 10 years adjuvantly but that has to be weighed with the potential risks of osteoporosis as well as cardiovascular disease. At this time, her risk of osteoporosis as well as cardiovascular disease is minimal and she feels comfortable resuming this medication at this time.  [facility], MD 8/20/201410:23 AM

## 2013-03-18 ENCOUNTER — Telehealth: Payer: Self-pay | Admitting: *Deleted

## 2013-03-18 LAB — CANCER ANTIGEN 27.29: CA 27.29: 37 U/mL (ref 0–39)

## 2013-03-18 NOTE — Telephone Encounter (Signed)
Message copied by Reesa Chew on Thu Mar 18, 2013  3:55 PM ------      Message from: Benjiman Core      Created: Thu Mar 18, 2013 10:53 AM       Please call her result. Normal ------

## 2013-03-18 NOTE — Telephone Encounter (Signed)
Gave patient results of tumor markers from last visit, yesterday

## 2013-03-30 ENCOUNTER — Other Ambulatory Visit: Payer: Self-pay | Admitting: Dermatology

## 2013-03-30 DIAGNOSIS — L821 Other seborrheic keratosis: Secondary | ICD-10-CM | POA: Diagnosis not present

## 2013-03-30 DIAGNOSIS — L988 Other specified disorders of the skin and subcutaneous tissue: Secondary | ICD-10-CM | POA: Diagnosis not present

## 2013-03-30 DIAGNOSIS — D239 Other benign neoplasm of skin, unspecified: Secondary | ICD-10-CM | POA: Diagnosis not present

## 2013-03-30 DIAGNOSIS — R234 Changes in skin texture: Secondary | ICD-10-CM | POA: Diagnosis not present

## 2013-03-30 DIAGNOSIS — D485 Neoplasm of uncertain behavior of skin: Secondary | ICD-10-CM | POA: Diagnosis not present

## 2013-04-23 ENCOUNTER — Other Ambulatory Visit: Payer: Self-pay | Admitting: Dermatology

## 2013-04-23 DIAGNOSIS — D485 Neoplasm of uncertain behavior of skin: Secondary | ICD-10-CM | POA: Diagnosis not present

## 2013-04-23 DIAGNOSIS — L089 Local infection of the skin and subcutaneous tissue, unspecified: Secondary | ICD-10-CM | POA: Diagnosis not present

## 2013-05-04 DIAGNOSIS — Z23 Encounter for immunization: Secondary | ICD-10-CM | POA: Diagnosis not present

## 2013-07-05 ENCOUNTER — Other Ambulatory Visit: Payer: Self-pay

## 2013-07-05 DIAGNOSIS — Z1231 Encounter for screening mammogram for malignant neoplasm of breast: Secondary | ICD-10-CM

## 2013-07-05 DIAGNOSIS — Z9889 Other specified postprocedural states: Secondary | ICD-10-CM

## 2013-07-05 DIAGNOSIS — Z853 Personal history of malignant neoplasm of breast: Secondary | ICD-10-CM

## 2013-07-13 DIAGNOSIS — H2510 Age-related nuclear cataract, unspecified eye: Secondary | ICD-10-CM | POA: Diagnosis not present

## 2013-07-13 DIAGNOSIS — H2589 Other age-related cataract: Secondary | ICD-10-CM | POA: Diagnosis not present

## 2013-07-13 DIAGNOSIS — H40019 Open angle with borderline findings, low risk, unspecified eye: Secondary | ICD-10-CM | POA: Diagnosis not present

## 2013-07-19 DIAGNOSIS — S43429A Sprain of unspecified rotator cuff capsule, initial encounter: Secondary | ICD-10-CM | POA: Diagnosis not present

## 2013-08-02 DIAGNOSIS — S43429A Sprain of unspecified rotator cuff capsule, initial encounter: Secondary | ICD-10-CM | POA: Diagnosis not present

## 2013-08-09 DIAGNOSIS — M75120 Complete rotator cuff tear or rupture of unspecified shoulder, not specified as traumatic: Secondary | ICD-10-CM | POA: Diagnosis not present

## 2013-08-11 ENCOUNTER — Ambulatory Visit: Payer: BLUE CROSS/BLUE SHIELD

## 2013-08-12 ENCOUNTER — Ambulatory Visit
Admission: RE | Admit: 2013-08-12 | Discharge: 2013-08-12 | Disposition: A | Discharge status: Home / Self Care | Payer: Medicare Other | Source: Ambulatory Visit

## 2013-08-12 DIAGNOSIS — Z9889 Other specified postprocedural states: Secondary | ICD-10-CM

## 2013-08-12 DIAGNOSIS — Z1231 Encounter for screening mammogram for malignant neoplasm of breast: Secondary | ICD-10-CM

## 2013-08-12 DIAGNOSIS — Z853 Personal history of malignant neoplasm of breast: Secondary | ICD-10-CM

## 2013-08-17 DIAGNOSIS — H539 Unspecified visual disturbance: Secondary | ICD-10-CM | POA: Diagnosis not present

## 2013-08-17 DIAGNOSIS — R143 Flatulence: Secondary | ICD-10-CM | POA: Diagnosis not present

## 2013-08-17 DIAGNOSIS — M75120 Complete rotator cuff tear or rupture of unspecified shoulder, not specified as traumatic: Secondary | ICD-10-CM | POA: Diagnosis not present

## 2013-08-17 DIAGNOSIS — E559 Vitamin D deficiency, unspecified: Secondary | ICD-10-CM | POA: Diagnosis not present

## 2013-08-17 DIAGNOSIS — R141 Gas pain: Secondary | ICD-10-CM | POA: Diagnosis not present

## 2013-08-17 DIAGNOSIS — G479 Sleep disorder, unspecified: Secondary | ICD-10-CM | POA: Diagnosis not present

## 2013-08-17 DIAGNOSIS — Z23 Encounter for immunization: Secondary | ICD-10-CM | POA: Diagnosis not present

## 2013-08-17 DIAGNOSIS — I10 Essential (primary) hypertension: Secondary | ICD-10-CM | POA: Diagnosis not present

## 2013-08-17 DIAGNOSIS — Z0000 Encounter for general adult medical examination without abnormal findings: Secondary | ICD-10-CM | POA: Diagnosis not present

## 2013-08-24 ENCOUNTER — Encounter: Payer: Self-pay | Admitting: Gastroenterology

## 2013-08-24 ENCOUNTER — Ambulatory Visit (INDEPENDENT_AMBULATORY_CARE_PROVIDER_SITE_OTHER): Payer: Medicare Other | Admitting: Gastroenterology

## 2013-08-24 VITALS — BP 160/86 | HR 60 | Ht 65.0 in | Wt 147.0 lb

## 2013-08-24 DIAGNOSIS — R141 Gas pain: Secondary | ICD-10-CM

## 2013-08-24 DIAGNOSIS — R142 Eructation: Secondary | ICD-10-CM | POA: Diagnosis not present

## 2013-08-24 DIAGNOSIS — R0789 Other chest pain: Secondary | ICD-10-CM | POA: Insufficient documentation

## 2013-08-24 DIAGNOSIS — R143 Flatulence: Secondary | ICD-10-CM

## 2013-08-24 DIAGNOSIS — R1013 Epigastric pain: Secondary | ICD-10-CM

## 2013-08-24 MED ORDER — PANTOPRAZOLE SODIUM 40 MG PO TBEC: 40.0000 mg | DELAYED_RELEASE_TABLET | Freq: Two times a day (BID) | ORAL | Status: DC

## 2013-08-24 NOTE — Patient Instructions (Signed)
You have been scheduled for an endoscopy with propofol. Please follow written instructions given to you at your visit today. If you use inhalers (even only as needed), please bring them with you on the day of your procedure.   We have sent the following medications to your pharmacy for you to pick up at your convenience:  Protonix

## 2013-08-24 NOTE — Progress Notes (Signed)
08/24/2013 Eileen [name] [id number] [birth year]   History of Present Illness:  This is a pleasant 68 year old female who is known to Dr. Olevia Perches for previous colonoscopies.  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  She has been referred to our office by her PCP, Dr. Kenton Kingfisher, on this occasion for evaluation of epigastric abdominal pain that goes into her chest with belching.  She says that for the past 3 or 4 weeks she's experienced intermittent episodes of these symptoms, which are waking her up from sleep in the middle of the night. It does not occur every night, but when it does it has not be relieved by chewing Tums, drinking honey or club soda, etc. Is described as sharp pains and will usually keep her awake for proximal a 1.5 hours before she falls asleep. When she wakes up again the pain is gone. This never occurs during the day, but she does complain of a lot of deep belching. Says it occasionally hurts when she swallows, but she denies any difficulty swallowing or food getting stuck. She denies any nausea, vomiting, decrease in appetite or weight loss. She saw her PCP who ordered labs, but she states that she has not know the results of those yet. We will try to obtain those results. They placed her on Protonix 40 mg daily, which she has only been taking for less than a week, but has not seen a noticed much difference with that.  She had an EGD in 2006 at which time the study was normal.   Current Medications, Allergies, Past Medical History, Past Surgical History, Family History and Social History were reviewed in [employer] record.   Physical Exam: BP 160/86  Pulse 60  Ht 5\' 5"  (1.651 m)  Wt 147 lb (66.679 kg)  BMI 24.46 kg/m2  LMP 07/30/1995 General: Well developed white female in no acute distress Head: Normocephalic and atraumatic Eyes:  Sclerae anicteric, conjunctiva pink  Ears: Normal auditory  acuity. Lungs: Clear throughout to auscultation. Heart: Regular rate and rhythm. Abdomen: Soft, non-distended.  Normal bowel sounds.  Minimal TTP in the epigastric region. Musculoskeletal: Symmetrical with no gross deformities  Extremities: No edema  Neurological: Alert oriented x 4, grossly non-focal Psychological:  Alert and cooperative. Normal mood and affect  Assessment and Recommendations: -Epigastric abdominal pain with atypical chest pain and belching:  Will schedule EGD for evaluation.  Will increase protonix to BID in the interim.  She had labs recently by her PCP so we will try to obtain those results (I don't know at this point what all was checked).

## 2013-08-25 NOTE — Progress Notes (Signed)
Reviewed, Amy I had an opening this Friday 1/30 in am in [place] if it is still available, please try that.Thanx

## 2013-08-27 ENCOUNTER — Encounter: Payer: Self-pay | Admitting: Internal Medicine

## 2013-08-27 ENCOUNTER — Ambulatory Visit (AMBULATORY_SURGERY_CENTER): Payer: Medicare Other | Admitting: Internal Medicine

## 2013-08-27 VITALS — BP 154/72 | HR 65 | Temp 98.3°F | Resp 31 | Ht 65.0 in | Wt 147.0 lb

## 2013-08-27 DIAGNOSIS — K2100 Gastro-esophageal reflux disease with esophagitis, without bleeding: Secondary | ICD-10-CM

## 2013-08-27 DIAGNOSIS — K2970 Gastritis, unspecified, without bleeding: Secondary | ICD-10-CM

## 2013-08-27 DIAGNOSIS — R1013 Epigastric pain: Secondary | ICD-10-CM

## 2013-08-27 DIAGNOSIS — I10 Essential (primary) hypertension: Secondary | ICD-10-CM | POA: Diagnosis not present

## 2013-08-27 DIAGNOSIS — K2270 Barrett's esophagus without dysplasia: Secondary | ICD-10-CM

## 2013-08-27 DIAGNOSIS — D649 Anemia, unspecified: Secondary | ICD-10-CM | POA: Diagnosis not present

## 2013-08-27 DIAGNOSIS — K2990 Gastroduodenitis, unspecified, without bleeding: Secondary | ICD-10-CM

## 2013-08-27 DIAGNOSIS — R0789 Other chest pain: Secondary | ICD-10-CM | POA: Diagnosis not present

## 2013-08-27 DIAGNOSIS — R141 Gas pain: Secondary | ICD-10-CM | POA: Diagnosis not present

## 2013-08-27 MED ORDER — SODIUM CHLORIDE 0.9 % IV SOLN: 500.0000 mL | INTRAVENOUS | Status: DC

## 2013-08-27 NOTE — Progress Notes (Signed)
Procedure ends, to recovery, report given and VSS. 

## 2013-08-27 NOTE — Op Note (Signed)
[place]  [address] [place], [address]   ENDOSCOPY PROCEDURE REPORT  PATIENT: Eileen [name], Eileen [name]  MR#: [id number] BIRTHDATE: Jan 27, 1946 , 48  yrs. old GENDER: Female ENDOSCOPIST: Lafayette Dragon, MD REFERRED BY:  Shirline Frees, M.D. PROCEDURE DATE:  08/27/2013 PROCEDURE:  EGD w/ biopsy ASA CLASS:     Class II INDICATIONS:  Epigastric pain.   last upper endoscopy in 2006 was normal, she experience nocturnal epigastric pain, belching.. Improved on Protonix x10 days. MEDICATIONS: MAC sedation, administered by CRNA and Propofol (Diprivan) 160 mg IV TOPICAL ANESTHETIC: none  DESCRIPTION OF PROCEDURE: After the risks benefits and alternatives of the procedure were thoroughly explained, informed consent was obtained.  The LB [id number] endoscope was introduced through the mouth and advanced to the second portion of the duodenum. Without limitations.  The instrument was slowly withdrawn as the mucosa was fully examined.      Esophagus: esophageal mucosa was normal in proximal and midesophagus. Z line was slightly irregular. There was a suggestion of an erosion at the GE junction. Biopsies were obtained to rule out Barrett's esophagus. There was no stricture Stomach: There was a small sliding hiatal hernia which measured 2 cm and extended from 38-40 cm from the incisors. Gastric folds were normal. Gastric antrum showed mild erythema biopsies were obtained to rule out H. pylori. Gastric outlet was normal . Retroflexion of the endoscope revealed normal fundus and cardiaDuodenum: Duodenal bulb and descending duodenum was normal[          The scope was then withdrawn from the patient and the procedure completed.  COMPLICATIONS: There were no complications. ENDOSCOPIC IMPRESSION: small hiatal hernia with grade 1 esophagitis. Status post biopsies to rule out Barrett's esophagus Minimal antral gastritis status post biopsies to rule out H. pylori Patient's  nocturnal episodes of epigastric pain was tried likely related to a episode of reflux. RECOMMENDATIONS:  antireflux measures Await results of pathology Continue Protonix 40 mg once a day  REPEAT EXAM: for EGD pending biopsy results.  eSigned:  Lafayette Dragon, MD 08/27/2013 9:34 AM   CC:  PATIENT NAME:  [name], [name] MR#: [id number]

## 2013-08-27 NOTE — Progress Notes (Signed)
No complaints noted in the recovery room. Maw   

## 2013-08-27 NOTE — Progress Notes (Signed)
Called to room to assist during endoscopic procedure.  Patient ID and intended procedure confirmed with present staff. Received instructions for my participation in the procedure from the performing physician.  

## 2013-08-27 NOTE — Patient Instructions (Addendum)

## 2013-08-30 ENCOUNTER — Telehealth: Payer: Self-pay | Admitting: *Deleted

## 2013-08-30 NOTE — Telephone Encounter (Signed)
  Follow up Call-  Call back number 08/27/2013 09/17/2011  Post procedure Call Back phone  # [phone]  Permission to leave phone message Yes Yes     Patient questions:  Do you have a fever, pain , or abdominal swelling? no Pain Score  0 *  Have you tolerated food without any problems? yes  Have you been able to return to your normal activities? yes  Do you have any questions about your discharge instructions: Diet   no Medications  no Follow up visit  no  Do you have questions or concerns about your Care? no  Actions: * If pain score is 4 or above: No action needed, pain <4.

## 2013-08-31 ENCOUNTER — Encounter: Payer: Self-pay | Admitting: Internal Medicine

## 2013-08-31 DIAGNOSIS — M67919 Unspecified disorder of synovium and tendon, unspecified shoulder: Secondary | ICD-10-CM | POA: Insufficient documentation

## 2013-08-31 DIAGNOSIS — S43429A Sprain of unspecified rotator cuff capsule, initial encounter: Secondary | ICD-10-CM | POA: Diagnosis not present

## 2013-09-01 ENCOUNTER — Encounter: Payer: Self-pay | Admitting: *Deleted

## 2013-09-08 DIAGNOSIS — M24119 Other articular cartilage disorders, unspecified shoulder: Secondary | ICD-10-CM | POA: Diagnosis not present

## 2013-09-08 DIAGNOSIS — M719 Bursopathy, unspecified: Secondary | ICD-10-CM | POA: Diagnosis not present

## 2013-09-08 DIAGNOSIS — M7520 Bicipital tendinitis, unspecified shoulder: Secondary | ICD-10-CM | POA: Diagnosis not present

## 2013-09-08 DIAGNOSIS — M75120 Complete rotator cuff tear or rupture of unspecified shoulder, not specified as traumatic: Secondary | ICD-10-CM | POA: Diagnosis not present

## 2013-09-08 DIAGNOSIS — M75110 Incomplete rotator cuff tear or rupture of unspecified shoulder, not specified as traumatic: Secondary | ICD-10-CM | POA: Diagnosis not present

## 2013-09-08 DIAGNOSIS — M75100 Unspecified rotator cuff tear or rupture of unspecified shoulder, not specified as traumatic: Secondary | ICD-10-CM | POA: Diagnosis not present

## 2013-09-08 DIAGNOSIS — IMO0002 Reserved for concepts with insufficient information to code with codable children: Secondary | ICD-10-CM | POA: Diagnosis not present

## 2013-09-08 DIAGNOSIS — M67919 Unspecified disorder of synovium and tendon, unspecified shoulder: Secondary | ICD-10-CM | POA: Diagnosis not present

## 2013-09-08 DIAGNOSIS — S43429A Sprain of unspecified rotator cuff capsule, initial encounter: Secondary | ICD-10-CM | POA: Diagnosis not present

## 2013-09-08 HISTORY — PX: ROTATOR CUFF REPAIR: SHX139

## 2013-09-15 DIAGNOSIS — Z9889 Other specified postprocedural states: Secondary | ICD-10-CM | POA: Diagnosis not present

## 2013-09-15 DIAGNOSIS — M25519 Pain in unspecified shoulder: Secondary | ICD-10-CM | POA: Diagnosis not present

## 2013-09-22 DIAGNOSIS — M25519 Pain in unspecified shoulder: Secondary | ICD-10-CM | POA: Diagnosis not present

## 2013-09-22 DIAGNOSIS — Z9889 Other specified postprocedural states: Secondary | ICD-10-CM | POA: Diagnosis not present

## 2013-09-30 DIAGNOSIS — M25519 Pain in unspecified shoulder: Secondary | ICD-10-CM | POA: Diagnosis not present

## 2013-10-04 DIAGNOSIS — R21 Rash and other nonspecific skin eruption: Secondary | ICD-10-CM | POA: Diagnosis not present

## 2013-10-07 DIAGNOSIS — M25519 Pain in unspecified shoulder: Secondary | ICD-10-CM | POA: Diagnosis not present

## 2013-10-11 DIAGNOSIS — Z9889 Other specified postprocedural states: Secondary | ICD-10-CM | POA: Diagnosis not present

## 2013-10-11 DIAGNOSIS — M25519 Pain in unspecified shoulder: Secondary | ICD-10-CM | POA: Diagnosis not present

## 2013-10-25 DIAGNOSIS — H539 Unspecified visual disturbance: Secondary | ICD-10-CM | POA: Diagnosis not present

## 2013-10-27 DIAGNOSIS — M25519 Pain in unspecified shoulder: Secondary | ICD-10-CM | POA: Diagnosis not present

## 2013-10-27 DIAGNOSIS — Z9889 Other specified postprocedural states: Secondary | ICD-10-CM | POA: Diagnosis not present

## 2013-11-04 DIAGNOSIS — M25519 Pain in unspecified shoulder: Secondary | ICD-10-CM | POA: Diagnosis not present

## 2013-11-04 DIAGNOSIS — Z9889 Other specified postprocedural states: Secondary | ICD-10-CM | POA: Diagnosis not present

## 2013-11-15 ENCOUNTER — Encounter (INDEPENDENT_AMBULATORY_CARE_PROVIDER_SITE_OTHER): Payer: Self-pay | Admitting: General Surgery

## 2013-11-15 ENCOUNTER — Ambulatory Visit (INDEPENDENT_AMBULATORY_CARE_PROVIDER_SITE_OTHER): Payer: Medicare Other | Admitting: General Surgery

## 2013-11-15 VITALS — BP 132/80 | HR 72 | Temp 98.5°F | Resp 14 | Ht 65.0 in | Wt 148.6 lb

## 2013-11-15 DIAGNOSIS — C50919 Malignant neoplasm of unspecified site of unspecified female breast: Secondary | ICD-10-CM | POA: Diagnosis not present

## 2013-11-15 DIAGNOSIS — C189 Malignant neoplasm of colon, unspecified: Secondary | ICD-10-CM | POA: Diagnosis not present

## 2013-11-15 NOTE — Progress Notes (Signed)
Patient ID: Eileen [name], female   DOB: [birth year], 68 y.o.   MRN: [id number] History: Eileen [name] is a 68 y.o. female. She returns for long-term followup regarding her breast cancer and colon cancer.  In 1995 she presented with a small cancer in the left breast. She underwent left partial mastectomy and axillary lymph node dissection. This was a 1.5 cm tumor,stage TI C. N0. Dr. Truddie Coco was involved in her care. She recurred in the left supraclavicular nodes and I biopsied that node, put a Hickman catheter in her, and she went to [facility]  and had a stem cell transplant. Today she has no known regional or metastatic breast disease.Recent bilateral mammograms on Jan. 15,2015  looked good, no focal abnormality. BIRADS cat I.  She gets mammograms annually.  In 2006 she developed a right colon cancer. I performed a right colectomy for what turned out to be a stage T3 N0 tumor. She was followed by Dr. Jamse Arn who did her lab work. She is now reassigned to Dr. Alen Blew and is scheduled to see him in August of 2014.  Colonoscopy performed on September 17, 2011 by Dr. Olevia Perches is normal with no evidence of disease.  She has no complaints about her digestive function and no complaints about her breast. Her weight has been stable. She exercises daily and goes to the gym daily. She was worked up for right upper quadrant pain by Dr. Olevia Perches. ERCP was negative. She still has this pain but is not very frequent anymore. Denies chest wall tenderness.  She basically feels fine and has no new complaints. Denies left arm swelling or numbness.She is followed by Dr. Samara Snide for primary care.Dr. Estill Dooms is her oncologist now.  Past history, family history, social history  are documented on the chart, unchanged, noncontributory except as described above.  ROS: She had right shoulder surgery 3 months ago and did. Still working with PT. She states that she gets upper endoscopy every 3 years because of Barrett's esophagus. This is  coordinated with her colonoscopy.  Exam: Constitutional: She is oriented to person, place, and time. She appears well-developed and well-nourished. No distress.  HENT:  Head: Normocephalic and atraumatic.  Nose: Nose normal.  Mouth/Throat: No oropharyngeal exudate.  Eyes: Conjunctivae and EOM are normal. Pupils are equal, round, and reactive to light. Left eye exhibits no discharge. No scleral icterus.  Neck: Neck supple. No JVD present. No tracheal deviation present. No thyromegaly present.  Cardiovascular: Normal rate, regular rhythm, normal heart sounds and intact distal pulses.  No murmur heard.  Pulmonary/Chest: Effort normal and breath sounds normal. No respiratory distress. She has no wheezes. She has no rales. She exhibits no tenderness.  Lumpectomy scar left breast, inferiorly, well healed. No mass in either breast. No axillary adenopathy bilaterally. Well-healed left axillary scar. No arm swelling.  Abdominal: Soft. Bowel sounds are normal. She exhibits no distension and no mass. There is no tenderness. There is no rebound and no guarding.  Well healed right transverse scar. Nontender. No hernia. Abdominal exam otherwise benign.  Musculoskeletal: She exhibits no edema and no tenderness.  Lymphadenopathy:  She has no cervical adenopathy.  Neurological: She is alert and oriented to person, place, and time. She exhibits normal muscle tone. Coordination normal.  Skin: Skin is warm. No rash noted. She is not diaphoretic. No erythema. No pallor.  Psychiatric: She has a normal mood and affect. Her behavior is normal. Judgment and thought content normal.   Assessment  History stage I cancer  left breast, status post left partial mastectomy and axillary lymph node dissection 1995.  Status post metastatic breast cancer to left supraclavicular area, s/p stem cell transplant and chemotherapy. No evidence of recurrent disease at this time  History adenocarcinoma right colon, stage TIII N0, no  evidence of recurrence 8 years postop right colectomy, 2006 Barrett's esophagus. .  Plan  She will see Dr. Alen Blew Annually Repeat mammograms in January 2016 Return to see me in one year for breast exam and review Colonoscopy and EGD  2016, Dr. Ned Grace. Dalbert Batman, M.D., [facility] General and Minimally invasive Surgery Breast and Colorectal Surgery Office:   [phone] Pager:   [phone]

## 2013-11-15 NOTE — Patient Instructions (Signed)
Examination of your breasts and all of the lymph node areas is normal. Your mammograms in January are also normal. There is no evidence of breast cancer.  The rest of your physical exam is also normal.  Be sure to get mammograms in January, 2016.  Be sure to get a followup colonoscopy and upper endoscopy with Dr. Delfin Edis in 2016  Return to see Dr. Dalbert Batman in one year.

## 2013-12-23 ENCOUNTER — Encounter: Payer: Self-pay | Admitting: *Deleted

## 2013-12-28 DIAGNOSIS — M25519 Pain in unspecified shoulder: Secondary | ICD-10-CM | POA: Diagnosis not present

## 2013-12-28 DIAGNOSIS — Z4789 Encounter for other orthopedic aftercare: Secondary | ICD-10-CM | POA: Diagnosis not present

## 2013-12-29 ENCOUNTER — Encounter: Payer: Self-pay | Admitting: Neurology

## 2013-12-29 ENCOUNTER — Ambulatory Visit (INDEPENDENT_AMBULATORY_CARE_PROVIDER_SITE_OTHER): Payer: Medicare Other | Admitting: Neurology

## 2013-12-29 VITALS — BP 128/72 | HR 66 | Temp 98.0°F | Resp 16 | Ht 66.0 in | Wt 146.4 lb

## 2013-12-29 DIAGNOSIS — G43109 Migraine with aura, not intractable, without status migrainosus: Secondary | ICD-10-CM

## 2013-12-29 NOTE — Progress Notes (Signed)
NEUROLOGY CONSULTATION NOTE  Eileen [name] MRN: [id number] DOB: [birth year]  Referring provider: Dr. Kenton Kingfisher Primary care provider: Dr. Kenton Kingfisher  Reason for consult:  Episodic visual disturbance.  HISTORY OF PRESENT ILLNESS: Eileen [name] is a 68 year old right-handed woman with history of colon cancer status post surgery and chemo 2006, breast cancer 1995 and bone cancer 1999 status post stem cell transplant, on letrozol, GERD, TIA, hyperlipidemia, hypertension, vitamin D deficiency, and osteopenia who presents for visual disturbance. Records and images personally reviewed.  About 10 years ago ago, she began having episodic visual disturbance.  They involve  Seeing vertical lines in the periphery of her visual fields, as well as shaking of objects in her visual field. They can occur in either eye or in both eyes..  It lasts only a few seconds. Sometimes, they are followed by a dull left occipital headache that lasts approximately 2 hours. It is not debilitating. It is not associated with nausea, photophobia, or phonophobia. It usually resolves with a cup of coffee. They used to occur only once in a while. However, they've been occurring more frequently since this past February. She hasn't noticed any changes except that she started using a hot tub for her sore arm due to recent right shoulder surgery. Since February, they've been occurring approximately twice a month. She denies any focal numbness or weakness. She denies any new symptoms with these episodes. She denies any speech problems. 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  She most recently saw an ophthalmologist in December 2014, in which she was found to have borderline increased ophthalmic pressure and small cataracts, but overall exam was unremarkable.    She did have workup for that several years ago. An MRI of the brain with and without contrast was performed on 12/10/05, which showed  chronic white matter ischemic changes, we'll likely related to cancer treatment, but no metastatic disease or focal abnormalities to explain her symptoms.  She was admitted to the hospital in April 2013 due to recurrent syncopal episodes. At that time, an MRI of the brain without contrast was performed and revealed nonspecific white matter changes, pre-stable compared to the prior image from 2007. MRA of the head revealed mild atherosclerotic changes but no aneurysms or significant stenosis. An EEG was performed, which was normal. Ultimately, her symptoms were thought to be due to dehydration and an underlying UTI.  She saw an ophthalmologist in December 2014, in which she was found to have borderline increased ophthalmic pressure and small cataracts, but overall exam was unremarkable.    She reports remote history of headaches, in which she treated with coffee.  They increased in frequency during her pregnancy.  She never really thought much about them.  PAST MEDICAL HISTORY: Past Medical History  Diagnosis Date  . HEMORRHOIDS   . Hypertension   . CARCINOMA, BREAST 1995  . COLON CANCER 2006    T3 N0 tumor  . Anemia   . Axillary mass 7/99    Left  . Cervical spinal stenosis   . Headache(784.0)     PAST SURGICAL HISTORY: Past Surgical History  Procedure Laterality Date  . Bone marrow transplant  1999  . Total abdominal hysterectomy w/ bilateral salpingoophorectomy  1996  . Hemicolectomy  01/2005    right  . Breast lumpectomy      left breast  . Inguinal hernia repair    . Ercp  01/21/2012    Procedure: ENDOSCOPIC RETROGRADE CHOLANGIOPANCREATOGRAPHY (  ERCP);  Surgeon: Lafayette Dragon, MD;  Location: [facility];  Service: Endoscopy;  Laterality: [name];  . Neck mass excision Left   . Hysteroscopy  08/30/94  . Colonoscopy    . Tunneled venous catheter placement  9/98  . Bunionectomy  3/09, 10/09    right and left    MEDICATIONS: Current Outpatient Prescriptions on File Prior to Visit    Medication Sig Dispense Refill  . calcium carbonate (OS-CAL) 600 MG TABS tablet Take 600 mg by mouth 2 (two) times daily with a meal.      . letrozole (FEMARA) 2.5 MG tablet Take 2.5 mg by mouth daily.        Marland Kitchen losartan (COZAAR) 50 MG tablet Take 50 mg by mouth daily.      . Vitamin D, Ergocalciferol, (DRISDOL) 50000 UNITS CAPS 50,000 Units as directed. Once weekly       No current facility-administered medications on file prior to visit.    ALLERGIES: Allergies  Allergen Reactions  . Levofloxacin Other (See Comments)    Red streaks to IV site when IV dose infusing and arm feeling very irritated, throbbing, painful  . Tape     Can use paper tape, adhesive causes redness    FAMILY HISTORY: Family History  Problem Relation Age of Onset  . Uterine cancer Mother   . Heart attack Father   . Colon cancer Neg Hx   . Esophageal cancer Neg Hx   . Stomach cancer Neg Hx   . Rectal cancer Neg Hx     SOCIAL HISTORY: History   Social History  . Marital Status: Married    Spouse Name: [name]    Number of Children: [name]  . Years of Education: [name]   Occupational History  . Not on file.   Social History Main Topics  . Smoking status: Former Smoker    Quit date: 12/20/1990  . Smokeless tobacco: Never Used  . Alcohol Use: 0.0 - 0.5 oz/week    0-1 drink(s) per week  . Drug Use: No  . Sexual Activity: No   Other Topics Concern  . Not on file   Social History Narrative  . No narrative on file    REVIEW OF SYSTEMS: Constitutional: No fevers, chills, or sweats, no generalized fatigue, change in appetite Eyes: No visual changes, double vision, eye pain Ear, nose and throat: No hearing loss, ear pain, nasal congestion, sore throat Cardiovascular: No chest pain, palpitations Respiratory:  No shortness of breath at rest or with exertion, wheezes GastrointestinaI: No nausea, vomiting, diarrhea, abdominal pain, fecal incontinence Genitourinary:  No dysuria, urinary retention or  frequency Musculoskeletal:  No neck pain, back pain Integumentary: No rash, pruritus, skin lesions Neurological: as above Psychiatric: No depression, insomnia, anxiety Endocrine: No palpitations, fatigue, diaphoresis, mood swings, change in appetite, change in weight, increased thirst Hematologic/Lymphatic:  No anemia, purpura, petechiae. Allergic/Immunologic: no itchy/runny eyes, nasal congestion, recent allergic reactions, rashes  PHYSICAL EXAM: Filed Vitals:   12/29/13 1022  BP: 128/72  Pulse: 66  Temp: 98 F (36.7 C)  Resp: 16   General: No acute distress Head:  Normocephalic/atraumatic Neck: supple, no paraspinal tenderness, full range of motion Back: No paraspinal tenderness Heart: regular rate and rhythm Lungs: Clear to auscultation bilaterally. Vascular: No carotid bruits. Neurological Exam: Mental status: alert and oriented to person, place, and time, recent and remote memory intact, fund of knowledge intact, attention and concentration intact, speech fluent and not dysarthric, language intact. Cranial nerves: CN I: not  tested CN II: pupils equal, round and reactive to light, visual fields intact, fundi unremarkable, without vessel changes, exudates, hemorrhages or papilledema. CN III, IV, VI:  full range of motion, no nystagmus, no ptosis CN V: facial sensation intact CN VII: upper and lower face symmetric CN VIII: hearing intact CN IX, X: gag intact, uvula midline CN XI: sternocleidomastoid and trapezius muscles intact CN XII: tongue midline Bulk & Tone: normal, no fasciculations. Motor: 5 out of 5 throughout Sensation: Reduced vibration sensation in the lower extremities. In prick sensation intact. Deep Tendon Reflexes: 2+ throughout except absent in the ankles. Toes downgoing. Finger to nose testing: No dysmetria Heel to shin: No dysmetria Gait: Normal stride. Some difficulty with tandem walking. Romberg negative.  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  Prior workup has been unremarkable.    PLAN: Since these are the same episodes she has had for several years, unremarkable prior workup, and currently with non-focal exam findings, I don't think we need to rush to get a new MRI at this time.  I advised her to monitor for now.  If she should develop new symptoms, she should contact me and we can proceed with an MRI if necessary.  She will follow up in 2 months for a recheck.  Otherwise, there really isn't anything much to do.  She is in agreement with this plan.  45 minutes that with the patient, over 50% spent reviewing the chart, reviewing images, counseling and coordinating care.  Thank you for allowing me to take part in the care of this patient.  Metta Clines, DO  CC:  Shirline Frees, MD

## 2013-12-29 NOTE — Patient Instructions (Signed)
I think these visual symptoms are migraines.  These are the same episodes you have had for many years and you have had tests done in the past for it.  The only difference is that they are occurring more frequently.  Your exam looks okay.  I would continue to monitor episodes and symptoms and follow up in 2 months.  If you notice new symptoms, call and we can get MRI.

## 2013-12-30 ENCOUNTER — Other Ambulatory Visit: Payer: Self-pay | Admitting: Family Medicine

## 2013-12-30 ENCOUNTER — Ambulatory Visit
Admission: RE | Admit: 2013-12-30 | Discharge: 2013-12-30 | Disposition: A | Discharge status: Home / Self Care | Payer: Medicare Other | Source: Ambulatory Visit | Attending: Family Medicine | Admitting: Family Medicine

## 2013-12-30 DIAGNOSIS — R9389 Abnormal findings on diagnostic imaging of other specified body structures: Secondary | ICD-10-CM

## 2013-12-30 DIAGNOSIS — R109 Unspecified abdominal pain: Secondary | ICD-10-CM | POA: Diagnosis not present

## 2013-12-30 DIAGNOSIS — R1032 Left lower quadrant pain: Secondary | ICD-10-CM

## 2013-12-30 DIAGNOSIS — K7689 Other specified diseases of liver: Secondary | ICD-10-CM | POA: Diagnosis not present

## 2013-12-30 MED ORDER — IOHEXOL 300 MG/ML  SOLN
100.0000 mL | Freq: Once | INTRAMUSCULAR | Status: AC | PRN
  Administered 2013-12-30: 100 mL via INTRAVENOUS

## 2014-01-06 DIAGNOSIS — Z9889 Other specified postprocedural states: Secondary | ICD-10-CM | POA: Diagnosis not present

## 2014-01-06 DIAGNOSIS — M25519 Pain in unspecified shoulder: Secondary | ICD-10-CM | POA: Diagnosis not present

## 2014-01-07 ENCOUNTER — Ambulatory Visit
Admission: RE | Admit: 2014-01-07 | Discharge: 2014-01-07 | Disposition: A | Discharge status: Home / Self Care | Payer: Medicare Other | Source: Ambulatory Visit | Attending: Family Medicine | Admitting: Family Medicine

## 2014-01-07 DIAGNOSIS — R9389 Abnormal findings on diagnostic imaging of other specified body structures: Secondary | ICD-10-CM

## 2014-01-10 ENCOUNTER — Ambulatory Visit
Admission: RE | Admit: 2014-01-10 | Discharge: 2014-01-10 | Disposition: A | Discharge status: Home / Self Care | Payer: Medicare Other | Source: Ambulatory Visit | Attending: Family Medicine | Admitting: Family Medicine

## 2014-01-10 DIAGNOSIS — Z853 Personal history of malignant neoplasm of breast: Secondary | ICD-10-CM | POA: Diagnosis not present

## 2014-01-10 DIAGNOSIS — K7689 Other specified diseases of liver: Secondary | ICD-10-CM | POA: Diagnosis not present

## 2014-01-10 MED ORDER — GADOBENATE DIMEGLUMINE 529 MG/ML IV SOLN
13.0000 mL | Freq: Once | INTRAVENOUS | Status: AC | PRN
  Administered 2014-01-10: 13 mL via INTRAVENOUS

## 2014-01-21 DIAGNOSIS — M25519 Pain in unspecified shoulder: Secondary | ICD-10-CM | POA: Diagnosis not present

## 2014-01-21 DIAGNOSIS — Z9889 Other specified postprocedural states: Secondary | ICD-10-CM | POA: Diagnosis not present

## 2014-01-27 DIAGNOSIS — M25519 Pain in unspecified shoulder: Secondary | ICD-10-CM | POA: Diagnosis not present

## 2014-01-27 DIAGNOSIS — Z9889 Other specified postprocedural states: Secondary | ICD-10-CM | POA: Diagnosis not present

## 2014-02-10 DIAGNOSIS — M25519 Pain in unspecified shoulder: Secondary | ICD-10-CM | POA: Diagnosis not present

## 2014-02-10 DIAGNOSIS — Z9889 Other specified postprocedural states: Secondary | ICD-10-CM | POA: Diagnosis not present

## 2014-02-17 DIAGNOSIS — S43429A Sprain of unspecified rotator cuff capsule, initial encounter: Secondary | ICD-10-CM | POA: Diagnosis not present

## 2014-02-17 DIAGNOSIS — Z9889 Other specified postprocedural states: Secondary | ICD-10-CM | POA: Diagnosis not present

## 2014-02-17 DIAGNOSIS — M25519 Pain in unspecified shoulder: Secondary | ICD-10-CM | POA: Diagnosis not present

## 2014-02-28 DIAGNOSIS — Z9889 Other specified postprocedural states: Secondary | ICD-10-CM | POA: Diagnosis not present

## 2014-02-28 DIAGNOSIS — M25519 Pain in unspecified shoulder: Secondary | ICD-10-CM | POA: Diagnosis not present

## 2014-03-01 ENCOUNTER — Telehealth: Payer: Self-pay | Admitting: Neurology

## 2014-03-01 ENCOUNTER — Telehealth: Payer: Self-pay | Admitting: Oncology

## 2014-03-01 NOTE — Telephone Encounter (Signed)
Pt called on 03/01/14 to cancel her 03/16/14 f/u appt. She stated that she felt fine and did not think she needs to see a provider.

## 2014-03-01 NOTE — Telephone Encounter (Signed)
RETURNED PT'S CALL RE R/S 8/25 APPT. PT GIVEN NEW APPT FOR 8/26. APPT FOR 8/18 REMAINS THE SAME.

## 2014-03-15 ENCOUNTER — Other Ambulatory Visit: Payer: BLUE CROSS/BLUE SHIELD | Admitting: Lab

## 2014-03-15 ENCOUNTER — Other Ambulatory Visit (HOSPITAL_BASED_OUTPATIENT_CLINIC_OR_DEPARTMENT_OTHER): Payer: Medicare Other

## 2014-03-15 ENCOUNTER — Ambulatory Visit: Payer: BLUE CROSS/BLUE SHIELD | Admitting: Oncology

## 2014-03-15 DIAGNOSIS — C50919 Malignant neoplasm of unspecified site of unspecified female breast: Secondary | ICD-10-CM

## 2014-03-15 DIAGNOSIS — C189 Malignant neoplasm of colon, unspecified: Secondary | ICD-10-CM | POA: Diagnosis not present

## 2014-03-15 DIAGNOSIS — C180 Malignant neoplasm of cecum: Secondary | ICD-10-CM

## 2014-03-15 LAB — COMPREHENSIVE METABOLIC PANEL (CC13)
ALT: 22 U/L (ref 0–55)
AST: 20 U/L (ref 5–34)
Albumin: 3.7 g/dL (ref 3.5–5.0)
Alkaline Phosphatase: 73 U/L (ref 40–150)
Anion Gap: 7 mEq/L (ref 3–11)
BUN: 14.3 mg/dL (ref 7.0–26.0)
CO2: 28 mEq/L (ref 22–29)
Calcium: 9.4 mg/dL (ref 8.4–10.4)
Chloride: 107 mEq/L (ref 98–109)
Creatinine: 0.8 mg/dL (ref 0.6–1.1)
Glucose: 95 mg/dl (ref 70–140)
Potassium: 3.8 mEq/L (ref 3.5–5.1)
Sodium: 141 mEq/L (ref 136–145)
Total Bilirubin: 0.62 mg/dL (ref 0.20–1.20)
Total Protein: 6.6 g/dL (ref 6.4–8.3)

## 2014-03-15 LAB — CBC WITH DIFFERENTIAL/PLATELET
BASO%: 0.9 % (ref 0.0–2.0)
Basophils Absolute: 0.1 10*3/uL (ref 0.0–0.1)
EOS%: 0.9 % (ref 0.0–7.0)
Eosinophils Absolute: 0.1 10*3/uL (ref 0.0–0.5)
HCT: 41.3 % (ref 34.8–46.6)
HGB: 12.9 g/dL (ref 11.6–15.9)
LYMPH%: 18.8 % (ref 14.0–49.7)
MCH: 27.1 pg (ref 25.1–34.0)
MCHC: 31.2 g/dL — ABNORMAL LOW (ref 31.5–36.0)
MCV: 86.7 fL (ref 79.5–101.0)
MONO#: 0.5 10*3/uL (ref 0.1–0.9)
MONO%: 7.5 % (ref 0.0–14.0)
NEUT#: 4.8 10*3/uL (ref 1.5–6.5)
NEUT%: 71.9 % (ref 38.4–76.8)
Platelets: 221 10*3/uL (ref 145–400)
RBC: 4.77 10*6/uL (ref 3.70–5.45)
RDW: 13.3 % (ref 11.2–14.5)
WBC: 6.7 10*3/uL (ref 3.9–10.3)
lymph#: 1.3 10*3/uL (ref 0.9–3.3)

## 2014-03-15 LAB — CEA: CEA: 1.7 ng/mL (ref 0.0–5.0)

## 2014-03-15 LAB — CANCER ANTIGEN 27.29: CA 27.29: 39 U/mL (ref 0–39)

## 2014-03-16 ENCOUNTER — Ambulatory Visit: Payer: Medicare Other | Admitting: Neurology

## 2014-03-22 ENCOUNTER — Ambulatory Visit: Payer: BLUE CROSS/BLUE SHIELD | Admitting: Oncology

## 2014-03-22 DIAGNOSIS — M75120 Complete rotator cuff tear or rupture of unspecified shoulder, not specified as traumatic: Secondary | ICD-10-CM | POA: Diagnosis not present

## 2014-03-23 ENCOUNTER — Telehealth: Payer: Self-pay | Admitting: Oncology

## 2014-03-23 ENCOUNTER — Ambulatory Visit (HOSPITAL_BASED_OUTPATIENT_CLINIC_OR_DEPARTMENT_OTHER): Payer: Medicare Other | Admitting: Oncology

## 2014-03-23 ENCOUNTER — Encounter: Payer: Self-pay | Admitting: Oncology

## 2014-03-23 VITALS — BP 155/73 | HR 65 | Temp 98.0°F | Resp 20 | Ht 66.0 in | Wt 150.5 lb

## 2014-03-23 DIAGNOSIS — C189 Malignant neoplasm of colon, unspecified: Secondary | ICD-10-CM | POA: Diagnosis not present

## 2014-03-23 DIAGNOSIS — C50919 Malignant neoplasm of unspecified site of unspecified female breast: Secondary | ICD-10-CM

## 2014-03-23 NOTE — Progress Notes (Signed)
Hematology and Oncology Follow Up Visit  Eileen [name] [id number] Sep 22, 1945 68 y.o. 03/23/2014 1:52 PM Eileen [name], MDHarris, Eileen Saxon, MD   Principle Diagnosis:  68 year old woman with the following diagnoses: 1. Breast cancer diagnosed in 1995 she had a recurrence in 1999 and was treated with stem cell transplant and currently in remission. 2. Colon cancer diagnosed in 2006 status post resection adjuvant therapy and currently in remission. She was found to have stage IIA disease.   Prior Therapy: 1. She is status post lumpectomy and lymph node dissection followed by CMF and radiation therapy. 2. She is status post 4 cycles of Adriamycin and Taxotere followed by stem cell transplantation at [facility] on 08/16/1997 due to recurrence and the left supraclavicular lymph node. Her tumor is ER negative PR positive. 3. She is status post right colectomy for stage IIA adenocarcinoma of the colon in October of 2006. She is status post 10 cycles of FOLFOX concluded in 06/2005  Current therapy: Femara for the last 14 years.  Interim History:  [name] presents today for a followup visit. Since her last visit, she is feeling well without any symptoms. She does report symptoms of nasal congestion and seasonal allergies. She has not reported any illnesses or hospitalizations. She had not reported any abdominal pain, nausea, vomiting, diarrhea, or change in her bowel habits. She is not reporting any complications from the Femara at this time. She continues to be on calcium supplement without any complications. She does not report any headaches or blurry vision. Does not on any syncope. She does not report any chest pain or shortness of breath. Does not report any cough or hemoptysis. She does not report any nausea, vomiting or change in her bowel habits. She does not report any hematochezia or melena. Does not report any lymphadenopathy or petechiae. Rest of her review of systems  unremarkable.  Medications: I have reviewed the patient's current medications.  Current Outpatient Prescriptions  Medication Sig Dispense Refill  . calcium carbonate (OS-CAL) 600 MG TABS tablet Take 600 mg by mouth 2 (two) times daily with a meal.      . letrozole (FEMARA) 2.5 MG tablet Take 2.5 mg by mouth daily.        Marland Kitchen losartan (COZAAR) 50 MG tablet Take 50 mg by mouth daily.      . Vitamin D, Ergocalciferol, (DRISDOL) 50000 UNITS CAPS 50,000 Units as directed. Once weekly       No current facility-administered medications for this visit.     Allergies:  Allergies  Allergen Reactions  . Levofloxacin Other (See Comments)    Red streaks to IV site when IV dose infusing and arm feeling very irritated, throbbing, painful  . Tape     Can use paper tape, adhesive causes redness    Past Medical History, Surgical history, Social history, and Family History were reviewed and updated.   Physical Exam: Blood pressure 155/73, pulse 65, temperature 98 F (36.7 C), temperature source Oral, resp. rate 20, height 5\' 6"  (1.676 m), weight 150 lb 8 oz (68.266 kg), last menstrual period 07/30/1995, SpO2 100.00%. ECOG: 0 General appearance: alert Head: Normocephalic, without obvious abnormality, atraumatic Neck: no adenopathy Lymph nodes: Cervical, supraclavicular, and axillary nodes normal. Heart:regular rate and rhythm, S1, S2 normal, no murmur, click, rub or gallop Lung:chest clear, no wheezing, rales, normal symmetric air entry Abdomin: soft, non-tender, without masses or organomegaly EXT:no erythema, induration, or nodules   Lab Results: Lab Results  Component Value Date   WBC  6.7 03/15/2014   HGB 12.9 03/15/2014   HCT 41.3 03/15/2014   MCV 86.7 03/15/2014   PLT 221 03/15/2014     Chemistry      Component Value Date/Time   NA 141 03/15/2014 1003   NA 130* 09/28/2012 0231   NA 139 03/18/2012 1422   K 3.8 03/15/2014 1003   K 3.2* 09/28/2012 0231   K 4.2 03/18/2012 1422   CL 97 09/28/2012  0231   CL 100 03/18/2012 1422   CO2 28 03/15/2014 1003   CO2 22 09/28/2012 0231   CO2 31 03/18/2012 1422   BUN 14.3 03/15/2014 1003   BUN 17 09/28/2012 0231   BUN 13 03/18/2012 1422   CREATININE 0.8 03/15/2014 1003   CREATININE 0.72 09/28/2012 0231   CREATININE 0.9 03/18/2012 1422      Component Value Date/Time   CALCIUM 9.4 03/15/2014 1003   CALCIUM 8.8 09/28/2012 0231   CALCIUM 9.1 03/18/2012 1422   ALKPHOS 73 03/15/2014 1003   ALKPHOS 83 03/18/2012 1422   ALKPHOS 58 12/02/2011 1020   AST 20 03/15/2014 1003   AST 37 03/18/2012 1422   AST 21 12/02/2011 1020   ALT 22 03/15/2014 1003   ALT 40 03/18/2012 1422   ALT 25 12/02/2011 1020   BILITOT 0.62 03/15/2014 1003   BILITOT 0.70 03/18/2012 1422   BILITOT 0.9 12/02/2011 1020     Results for Eileen [name] (MRN [id number]) as of 03/23/2014 13:55  Ref. Range 03/15/2014 10:02  CA 27.29 Latest Range: 0-39 U/mL 39  CEA Latest Range: 0.0-5.0 ng/mL 1.7    Impression and Plan:  68 year old woman with the following issues:  1. Colon cancer diagnosed in 2006 and she is status post surgical resection followed by adjuvant FOLFOX chemotherapy that concluded in December of 2006. Her initial staging at stage Marily Lente has very little risk of relapse at this point. Laboratory data were reviewed today including a normal CEA. She is scheduled to have a routine colonoscopy in 2016. The bladder is continued active surveillance without any need for any intervention.  2. Breast cancer: Diagnosed in 1995 was treated with a lumpectomy and radiation followed by adjuvant chemotherapy. She had relapse in 1999 and was treated with high dose chemotherapy and stem cell transplant. She has no evidence of relapse disease and continued to be on Femara at this time. The risks and benefits of continuing this drug were discussed again. Complications include osteoporosis were discussed with potential benefit of protection from breast cancer recurrence might be valid beyond 10 years. She first and on  that for the time being I will continue to address this in the future visits.  3. Bone density: To continue to be on calcium supplements and she will need a repeat DEXA scan she continues to be on Femara.   Zola Button, MD 8/26/20151:52 PM

## 2014-03-23 NOTE — Telephone Encounter (Signed)
gv and printed appt sched and avs for pt fro Aug 2016

## 2014-04-05 DIAGNOSIS — L821 Other seborrheic keratosis: Secondary | ICD-10-CM | POA: Diagnosis not present

## 2014-04-05 DIAGNOSIS — D239 Other benign neoplasm of skin, unspecified: Secondary | ICD-10-CM | POA: Diagnosis not present

## 2014-04-07 ENCOUNTER — Ambulatory Visit (INDEPENDENT_AMBULATORY_CARE_PROVIDER_SITE_OTHER): Payer: Medicare Other | Admitting: Obstetrics & Gynecology

## 2014-04-07 ENCOUNTER — Encounter: Payer: Self-pay | Admitting: Obstetrics & Gynecology

## 2014-04-07 VITALS — BP 160/90 | HR 72 | Ht 65.75 in | Wt 147.0 lb

## 2014-04-07 DIAGNOSIS — Z01419 Encounter for gynecological examination (general) (routine) without abnormal findings: Secondary | ICD-10-CM

## 2014-04-07 DIAGNOSIS — Z124 Encounter for screening for malignant neoplasm of cervix: Secondary | ICD-10-CM

## 2014-04-07 NOTE — Progress Notes (Signed)
68 y.o. G3P2 MarriedCaucasianF here for annual exam.  No vaginal bleeding.  Doing well.  Son in [place], who had colon cancer, is doing well.  He and his wife are expecting another child.   Oncologist:  Dr. Alen Blew PCP:  Dr. Kenton Kingfisher  Patient's last menstrual period was 07/30/1995.          Sexually active: No.  The current method of family planning is status post hysterectomy.    Exercising: Yes.    Home exercise routine includes cardio and light weight lifting. Smoker:  no  Health Maintenance: Pap:  01/26/13 WNL Neg HR HPV History of abnormal Pap:  no MMG:  08/12/13 Bi-Rads Neg, 3D Colonoscopy:  2/13 Dr. Olevia Perches, repeat in 3 yrs BMD:   06/24/2011, -2.4, -1.8 (stable since 2008) TDaP:  01/02/12 Screening Labs: PCP, Hb today: PCP, Urine today: PCP   reports that she quit smoking about 23 years ago. She has never used smokeless tobacco. She reports that she drinks alcohol. She reports that she does not use illicit drugs.  Past Medical History  Diagnosis Date  . HEMORRHOIDS   . Hypertension   . CARCINOMA, BREAST 1995  . COLON CANCER 2006    T3 N0 tumor  . Anemia   . Axillary mass 7/99    Left  . Cervical spinal stenosis   . QTMAUQJF(354.5)     Past Surgical History  Procedure Laterality Date  . Bone marrow transplant  1999  . Total abdominal hysterectomy w/ bilateral salpingoophorectomy  1996  . Hemicolectomy  01/2005    right  . Breast lumpectomy      left breast  . Inguinal hernia repair    . Ercp  01/21/2012    Procedure: ENDOSCOPIC RETROGRADE CHOLANGIOPANCREATOGRAPHY (ERCP);  Surgeon: Lafayette Dragon, MD;  Location: [facility];  Service: Endoscopy;  Laterality: [name];  . Neck mass excision Left   . Hysteroscopy  08/30/94  . Colonoscopy    . Tunneled venous catheter placement  9/98  . Bunionectomy  3/09, 10/09    right and left  . Rotator cuff repair Right 09/08/13    Current Outpatient Prescriptions  Medication Sig Dispense Refill  . calcium carbonate (OS-CAL) 600 MG TABS  tablet Take 600 mg by mouth 2 (two) times daily with a meal.      . letrozole (FEMARA) 2.5 MG tablet Take 2.5 mg by mouth daily.        Marland Kitchen losartan (COZAAR) 50 MG tablet Take 50 mg by mouth daily.      . Vitamin D, Ergocalciferol, (DRISDOL) 50000 UNITS CAPS 50,000 Units as directed. Once weekly       No current facility-administered medications for this visit.    Family History  Problem Relation Age of Onset  . Uterine cancer Mother   . Heart attack Father   . Colon cancer Neg Hx   . Esophageal cancer Neg Hx   . Stomach cancer Neg Hx   . Rectal cancer Neg Hx     ROS:  Pertinent items are noted in HPI.  Otherwise, a comprehensive ROS was negative.  Exam:   BP 160/90  Pulse 72  Ht 5' 5.75" (1.67 m)  Wt 147 lb (66.679 kg)  BMI 23.91 kg/m2  LMP 07/30/1995   Height: 5' 5.75" (167 cm)  Ht Readings from Last 3 Encounters:  04/07/14 5' 5.75" (1.67 m)  03/23/14 5\' 6"  (1.676 m)  12/29/13 5\' 6"  (1.676 m)    General appearance: alert, cooperative and appears stated  age Head: Normocephalic, without obvious abnormality, atraumatic Neck: no adenopathy, supple, symmetrical, trachea midline and thyroid normal to inspection and palpation Lungs: clear to auscultation bilaterally Breasts: normal appearance, no masses or tenderness, well healed scar on left breast Heart: regular rate and rhythm Abdomen: soft, non-tender; bowel sounds normal; no masses,  no organomegaly Extremities: extremities normal, atraumatic, no cyanosis or edema Skin: Skin color, texture, turgor normal. No rashes or lesions Lymph nodes: Cervical, supraclavicular, and axillary nodes normal. No abnormal inguinal nodes palpated Neurologic: Grossly normal   Pelvic: External genitalia:  no lesions              Urethra:  normal appearing urethra with no masses, tenderness or lesions              Bartholins and Skenes: normal                 Vagina: normal appearing vagina with normal color and discharge, no lesions               Cervix: no lesions              Pap taken: No. Bimanual Exam:  Uterus:  uterus absent              Adnexa: no mass, fullness, tenderness               Rectovaginal: Confirms               Anus:  normal sphincter tone, no lesions  A:  Well Woman with normal exam  H/O TAH/BSO  No HRT  H/O colon cancer s/p hemicolectomy 7/06. Colonoscopy every three years.  H/O breast cancer with recurrence 1995/1998 with clavicular mass s/p resection, bone marrow transplant Vit D deficiency. Followed by PCP.   P: Mammogram yearly.  pap smear every other year. Neg with Neg HR HPV 2014.  No pap today.  Continues on Femara and followed yearly by oncology. return annually or prn  An After Visit Summary was printed and given to the patient.

## 2014-05-02 DIAGNOSIS — Z23 Encounter for immunization: Secondary | ICD-10-CM | POA: Diagnosis not present

## 2014-05-30 ENCOUNTER — Encounter: Payer: Self-pay | Admitting: Obstetrics & Gynecology

## 2014-07-04 ENCOUNTER — Other Ambulatory Visit: Payer: Self-pay

## 2014-07-04 DIAGNOSIS — Z1231 Encounter for screening mammogram for malignant neoplasm of breast: Secondary | ICD-10-CM

## 2014-08-03 DIAGNOSIS — M67911 Unspecified disorder of synovium and tendon, right shoulder: Secondary | ICD-10-CM | POA: Diagnosis not present

## 2014-08-15 ENCOUNTER — Ambulatory Visit
Admission: RE | Admit: 2014-08-15 | Discharge: 2014-08-15 | Disposition: A | Discharge status: Home / Self Care | Payer: Medicare Other | Source: Ambulatory Visit

## 2014-08-15 DIAGNOSIS — Z1231 Encounter for screening mammogram for malignant neoplasm of breast: Secondary | ICD-10-CM | POA: Diagnosis not present

## 2014-08-24 DIAGNOSIS — H40013 Open angle with borderline findings, low risk, bilateral: Secondary | ICD-10-CM | POA: Diagnosis not present

## 2014-08-24 DIAGNOSIS — H47323 Drusen of optic disc, bilateral: Secondary | ICD-10-CM | POA: Diagnosis not present

## 2014-08-24 DIAGNOSIS — H2513 Age-related nuclear cataract, bilateral: Secondary | ICD-10-CM | POA: Diagnosis not present

## 2014-08-31 ENCOUNTER — Encounter: Payer: Self-pay | Admitting: Internal Medicine

## 2014-09-21 DIAGNOSIS — M75121 Complete rotator cuff tear or rupture of right shoulder, not specified as traumatic: Secondary | ICD-10-CM | POA: Diagnosis not present

## 2014-09-21 DIAGNOSIS — M75101 Unspecified rotator cuff tear or rupture of right shoulder, not specified as traumatic: Secondary | ICD-10-CM | POA: Diagnosis not present

## 2014-09-21 DIAGNOSIS — M25511 Pain in right shoulder: Secondary | ICD-10-CM | POA: Diagnosis not present

## 2014-09-21 DIAGNOSIS — M75122 Complete rotator cuff tear or rupture of left shoulder, not specified as traumatic: Secondary | ICD-10-CM | POA: Insufficient documentation

## 2014-09-27 ENCOUNTER — Telehealth: Payer: Self-pay | Admitting: *Deleted

## 2014-09-27 ENCOUNTER — Ambulatory Visit (AMBULATORY_SURGERY_CENTER): Payer: Self-pay | Admitting: *Deleted

## 2014-09-27 VITALS — Ht 65.0 in | Wt 145.4 lb

## 2014-09-27 DIAGNOSIS — Z85038 Personal history of other malignant neoplasm of large intestine: Secondary | ICD-10-CM

## 2014-09-27 MED ORDER — MOVIPREP 100 G PO SOLR: ORAL | Status: DC

## 2014-09-27 NOTE — Telephone Encounter (Signed)
Dr. Olevia Perches,  Ms Eileen [name] is coming in her her recall colonoscopy on 10-11-14.  She has a hx of Barretts, which she had checked last year.  She is also having difficulty with heartburn in the evenings.  She would like to know if she can have an endo-colon now, to check her heartburn and to also get her on the same schedule for her procedures.  Thanks, [employer]

## 2014-09-27 NOTE — Progress Notes (Signed)
No egg or soy allergy  No anesthesia or intubation problems per pt  No diet medications taken  Registered in [place]  Pt would like an EGD as well.  She has hx of Barretts with her last EGD 2015 but also has been having trouble with heartburn.  Note sent to Dr. Olevia Perches to see if this is ok

## 2014-09-29 NOTE — Telephone Encounter (Signed)
OK to schedule EGD/colon the same day. If we have to reschedule to a different date, please, make sure that the schedule has been cleared.I prefer that she is not rescheduled and we can still fit her in  for the same date.

## 2014-09-29 NOTE — Telephone Encounter (Signed)
Pt made aware and ECL scheduled for the same day, 10-11-14 at 3:00.  She is told to arrive at 2:00 p.m. And new instructions mailed to her

## 2014-10-11 ENCOUNTER — Ambulatory Visit (AMBULATORY_SURGERY_CENTER): Payer: Medicare Other | Admitting: Internal Medicine

## 2014-10-11 ENCOUNTER — Encounter: Payer: Medicare Other | Admitting: Internal Medicine

## 2014-10-11 ENCOUNTER — Encounter: Payer: Self-pay | Admitting: Internal Medicine

## 2014-10-11 VITALS — BP 171/100 | HR 77 | Temp 97.2°F | Resp 39 | Ht 65.0 in | Wt 145.0 lb

## 2014-10-11 DIAGNOSIS — R109 Unspecified abdominal pain: Secondary | ICD-10-CM | POA: Diagnosis not present

## 2014-10-11 DIAGNOSIS — Z85038 Personal history of other malignant neoplasm of large intestine: Secondary | ICD-10-CM

## 2014-10-11 DIAGNOSIS — K210 Gastro-esophageal reflux disease with esophagitis: Secondary | ICD-10-CM | POA: Diagnosis not present

## 2014-10-11 DIAGNOSIS — C189 Malignant neoplasm of colon, unspecified: Secondary | ICD-10-CM

## 2014-10-11 DIAGNOSIS — I10 Essential (primary) hypertension: Secondary | ICD-10-CM | POA: Diagnosis not present

## 2014-10-11 DIAGNOSIS — Z8673 Personal history of transient ischemic attack (TIA), and cerebral infarction without residual deficits: Secondary | ICD-10-CM | POA: Diagnosis not present

## 2014-10-11 DIAGNOSIS — K219 Gastro-esophageal reflux disease without esophagitis: Secondary | ICD-10-CM | POA: Diagnosis not present

## 2014-10-11 DIAGNOSIS — K2270 Barrett's esophagus without dysplasia: Secondary | ICD-10-CM

## 2014-10-11 MED ORDER — SODIUM CHLORIDE 0.9 % IV SOLN: 500.0000 mL | INTRAVENOUS | Status: DC

## 2014-10-11 MED ORDER — RANITIDINE HCL 150 MG PO TABS: 150.0000 mg | ORAL_TABLET | Freq: Every day | ORAL | Status: DC

## 2014-10-11 NOTE — Progress Notes (Signed)
Pt awake and alert, vss, report to RN, pleased with MAC

## 2014-10-11 NOTE — Op Note (Signed)
[place]  [address] [place], [address]   COLONOSCOPY PROCEDURE REPORT  PATIENT: Eileen [name], Eileen [name]  MR#: [id number] BIRTHDATE: May 26, 1946 , 11  yrs. old GENDER: female ENDOSCOPIST: Lafayette Dragon, MD REFERRED [id number] Kenton Kingfisher, M.D. PROCEDURE DATE:  10/11/2014 PROCEDURE:   Colonoscopy, screening First Screening Colonoscopy - Avg.  risk and is 50 yrs.  old or older - No.  Prior Negative Screening - Now for repeat screening. [name]  History of Adenoma - Now for follow-up colonoscopy & has been > or = to 3 yrs.  [name] ASA CLASS:   Class II INDICATIONS:history of adenocarcinoma of the right colon.  Status post right hemicolectomy in 2006.  Prior colonoscopies in 2008, hyperplastic polyp removed.  Last colonoscopy February 2013 showed only internal hemorrhoids. MEDICATIONS: Monitored anesthesia care  DESCRIPTION OF PROCEDURE:   After the risks benefits and alternatives of the procedure were thoroughly explained, informed consent was obtained.  The digital rectal exam revealed no abnormalities of the rectum.   The LB [id number]  endoscope was introduced through the anus and advanced to the surgical anastomosis. No adverse events experienced.   The quality of the prep was good.  (MoviPrep was used)  The instrument was then slowly withdrawn as the colon was fully examined.      COLON FINDINGS: There was evidence of a prior end-to-end ileocolonic surgical anastomosis in the terminal ileum. anastomosis was widely patent  There was mild diverticulosis noted in the sigmoid colon. Small internal Grade I hemorrhoids were found.  Retroflexed views revealed no abnormalities. The time to cecum = 5.32 Withdrawal time = 6.17   The scope was withdrawn and the procedure completed. COMPLICATIONS: There were no immediate complications.  ENDOSCOPIC IMPRESSION: 1.   There was evidence of a prior ileocolonic surgical anastomosis 2.   Mild diverticulosis was noted in the  sigmoid colon 3.   Small internal Grade I hemorrhoids 4.no evidence of recurrent cancer  RECOMMENDATIONS: High fiber diet as tolerated Call colonoscopy in 5 years  eSigned:  Lafayette Dragon, MD 10/11/2014 4:07 PM   cc:

## 2014-10-11 NOTE — Patient Instructions (Signed)
YOU HAD AN ENDOSCOPIC PROCEDURE TODAY AT THE [facility]:   Refer to the procedure report that was given to you for any specific questions about what was found during the examination.  If the procedure report does not answer your questions, please call your gastroenterologist to clarify.  If you requested that your care partner not be given the details of your procedure findings, then the procedure report has been included in a sealed envelope for you to review at your convenience later.  YOU SHOULD EXPECT: Some feelings of bloating in the abdomen. Passage of more gas than usual.  Walking can help get rid of the air that was put into your GI tract during the procedure and reduce the bloating. If you had a lower endoscopy (such as a colonoscopy or flexible sigmoidoscopy) you may notice spotting of blood in your stool or on the toilet paper. If you underwent a bowel prep for your procedure, you may not have a normal bowel movement for a few days.  Please Note:  You might notice some irritation and congestion in your nose or some drainage.  This is from the oxygen used during your procedure.  There is no need for concern and it should clear up in a day or so.  SYMPTOMS TO REPORT IMMEDIATELY:   Following lower endoscopy (colonoscopy or flexible sigmoidoscopy):  Excessive amounts of blood in the stool  Significant tenderness or worsening of abdominal pains  Swelling of the abdomen that is new, acute  Fever of 100F or higher   Following upper endoscopy (EGD)  Vomiting of blood or coffee ground material  New chest pain or pain under the shoulder blades  Painful or persistently difficult swallowing  New shortness of breath  Fever of 100F or higher  Black, tarry-looking stools  For urgent or emergent issues, a gastroenterologist can be reached at any hour by calling [phone].   DIET: Your first meal following the procedure should be a small meal and then it is ok to progress to  your normal diet. Heavy or fried foods are harder to digest and may make you feel nauseous or bloated.  Likewise, meals heavy in dairy and vegetables can increase bloating.  Drink plenty of fluids but you should avoid alcoholic beverages for 24 hours.  ACTIVITY:  You should plan to take it easy for the rest of today and you should NOT DRIVE or use heavy machinery until tomorrow (because of the sedation medicines used during the test).    FOLLOW UP: Our staff will call the number listed on your records the next business day following your procedure to check on you and address any questions or concerns that you may have regarding the information given to you following your procedure. If we do not reach you, we will leave a message.  However, if you are feeling well and you are not experiencing any problems, there is no need to return our call.  We will assume that you have returned to your regular daily activities without incident.  If any biopsies were taken you will be contacted by phone or by letter within the next 1-3 weeks.  Please call us at [phone] if you have not heard about the biopsies in 3 weeks.    SIGNATURES/CONFIDENTIALITY: You and/or your care partner have signed paperwork which will be entered into your electronic medical record.  These signatures attest to the fact that that the information above on your After Visit Summary has been reviewed   and is understood.  Full responsibility of the confidentiality of this discharge information lies with you and/or your care-partner.     Handouts were given to your care partner on GERD, hiatal hernia, barrett's esophagus,diverticulosis hemorrhoids, and a high fiber diet with liberal fluid intake. You might notice some irritation in your nose or drainage.  This may cause feelings of congestion.  This is from the oxygen, which can be drying.  This is no cause for concern; this should clear up in a few days.  You may resume your current  medications today.  New rx was written for ranitidine 150 mg at bed time. Await biopsy results. Please call if any questions or concerns.

## 2014-10-11 NOTE — Progress Notes (Signed)
Called to room to assist during endoscopic procedure.  Patient ID and intended procedure confirmed with present staff. Received instructions for my participation in the procedure from the performing physician.  

## 2014-10-11 NOTE — Progress Notes (Signed)
See prior note to complete note.  Maw  Instruct pt to take losartan 50 mg when she gets home.  A strip was printed and given to pt's husband to take to her medical md.  Per the pt her b/p has not been under control.  They will follow up with primary care md.  maw

## 2014-10-11 NOTE — Op Note (Signed)
[place]  [address]. [place], [address]   ENDOSCOPY PROCEDURE REPORT  PATIENT: [name], Eileen  MR#: [id number] BIRTHDATE: 09-28-1945 , 68  yrs. old GENDER: female ENDOSCOPIST: Lafayette Dragon, MD REFERRED BY:  Shirline Frees, M.D. PROCEDURE DATE:  10/11/2014 PROCEDURE:  EGD w/ biopsy ASA CLASS:     Class II INDICATIONS:  history of Barrett's esophagus and Last upper endoscopy 2013, Barrett's esophagus, he has been having heartburn.  MEDICATIONS: Monitored anesthesia care and Propofol 250 mg IV TOPICAL ANESTHETIC: none  DESCRIPTION OF PROCEDURE: After the risks benefits and alternatives of the procedure were thoroughly explained, informed consent was obtained.  The LB [id number] endoscope was introduced through the mouth and advanced to the second portion of the duodenum , Without limitations.  The instrument was slowly withdrawn as the mucosa was fully examined.    Esophagus: esophagus was intubated without difficulty. Proximal, mid and distal esophageal mucosa was normal. There was no evidence of esophageal stricture. There was a 2-3 cm with Lysbeth Galas erosions. There was no evidence of acute esophagitis. Biopsies were taken from 4 quadrants to rule out dysplasia  Stomach: gastric folds were normal. Gastric antrum pyloric outlet was unremarkable.  Retroflexion of the endoscope with the normal fundus and cardia  Duodenum: duodenal bulb and descending duodenum was normal[ The scope was then withdrawn from the patient and the procedure completed.  COMPLICATIONS: There were no immediate complications.  ENDOSCOPIC IMPRESSION: 1. history of Barrett's esophagus. Status post biopsies from squamocolumnar junction to rule out dysplasia 2. small reducible hiatal hernia  RECOMMENDATIONS: 1.  Anti-reflux regimen to be follow 2.  Await pathology results 3. Ranitidine 150 mg at bed time   REPEAT EXAM: for EGD pending biopsy results.  eSigned:  Lafayette Dragon, MD 10/11/2014 4:00 PM    CC:  PATIENT NAME:  Eileen [name], Eileen [name] MR#: [id number]

## 2014-10-11 NOTE — Progress Notes (Signed)
No problems noted in the recovery room. Maw   The pt blood pressure was escalating while in the recovery room.  Blood pressure readings shown to Dr. Delfin Edis.  Per Dr. Olevia Perches instruct pt to take losartan

## 2014-10-12 ENCOUNTER — Telehealth: Payer: Self-pay | Admitting: *Deleted

## 2014-10-12 NOTE — Telephone Encounter (Signed)
  Follow up Call-  Call back number 10/11/2014 08/27/2013  Post procedure Call Back phone  # [phone]  Permission to leave phone message Yes Yes     Patient questions:  Do you have a fever, pain , or abdominal swelling? No. Pain Score  0 *  Have you tolerated food without any problems? Yes.    Have you been able to return to your normal activities? No.  Do you have any questions about your discharge instructions: Diet   No. Medications  No. Follow up visit  No.  Do you have questions or concerns about your Care? No.  Actions: * If pain score is 4 or above: No action needed, pain <4.

## 2014-10-18 ENCOUNTER — Encounter: Payer: Self-pay | Admitting: Internal Medicine

## 2014-10-18 DIAGNOSIS — S46921A Laceration of unspecified muscle, fascia and tendon at shoulder and upper arm level, right arm, initial encounter: Secondary | ICD-10-CM | POA: Diagnosis not present

## 2014-10-18 DIAGNOSIS — M75121 Complete rotator cuff tear or rupture of right shoulder, not specified as traumatic: Secondary | ICD-10-CM | POA: Diagnosis not present

## 2014-10-19 DIAGNOSIS — Z85038 Personal history of other malignant neoplasm of large intestine: Secondary | ICD-10-CM | POA: Diagnosis not present

## 2014-10-19 DIAGNOSIS — Z9484 Stem cells transplant status: Secondary | ICD-10-CM | POA: Diagnosis not present

## 2014-10-19 DIAGNOSIS — Z853 Personal history of malignant neoplasm of breast: Secondary | ICD-10-CM | POA: Diagnosis not present

## 2014-10-19 DIAGNOSIS — Z8719 Personal history of other diseases of the digestive system: Secondary | ICD-10-CM | POA: Diagnosis not present

## 2014-10-22 ENCOUNTER — Encounter (HOSPITAL_COMMUNITY): Payer: Self-pay | Admitting: Emergency Medicine

## 2014-10-22 ENCOUNTER — Emergency Department (HOSPITAL_COMMUNITY): Payer: Medicare Other

## 2014-10-22 ENCOUNTER — Emergency Department (HOSPITAL_COMMUNITY)
Admission: EM | Admit: 2014-10-22 | Discharge: 2014-10-22 | Disposition: A | Discharge status: Home / Self Care | Payer: Medicare Other | Attending: Emergency Medicine | Admitting: Emergency Medicine

## 2014-10-22 DIAGNOSIS — Z85038 Personal history of other malignant neoplasm of large intestine: Secondary | ICD-10-CM | POA: Insufficient documentation

## 2014-10-22 DIAGNOSIS — Z853 Personal history of malignant neoplasm of breast: Secondary | ICD-10-CM | POA: Diagnosis not present

## 2014-10-22 DIAGNOSIS — Z8719 Personal history of other diseases of the digestive system: Secondary | ICD-10-CM | POA: Insufficient documentation

## 2014-10-22 DIAGNOSIS — R109 Unspecified abdominal pain: Secondary | ICD-10-CM

## 2014-10-22 DIAGNOSIS — Z87891 Personal history of nicotine dependence: Secondary | ICD-10-CM | POA: Insufficient documentation

## 2014-10-22 DIAGNOSIS — Z862 Personal history of diseases of the blood and blood-forming organs and certain disorders involving the immune mechanism: Secondary | ICD-10-CM | POA: Diagnosis not present

## 2014-10-22 DIAGNOSIS — I10 Essential (primary) hypertension: Secondary | ICD-10-CM | POA: Diagnosis not present

## 2014-10-22 DIAGNOSIS — M546 Pain in thoracic spine: Secondary | ICD-10-CM | POA: Insufficient documentation

## 2014-10-22 DIAGNOSIS — Z791 Long term (current) use of non-steroidal anti-inflammatories (NSAID): Secondary | ICD-10-CM | POA: Insufficient documentation

## 2014-10-22 DIAGNOSIS — M5136 Other intervertebral disc degeneration, lumbar region: Secondary | ICD-10-CM | POA: Diagnosis not present

## 2014-10-22 DIAGNOSIS — M545 Low back pain: Secondary | ICD-10-CM | POA: Diagnosis present

## 2014-10-22 DIAGNOSIS — M419 Scoliosis, unspecified: Secondary | ICD-10-CM | POA: Diagnosis not present

## 2014-10-22 DIAGNOSIS — M62830 Muscle spasm of back: Secondary | ICD-10-CM

## 2014-10-22 DIAGNOSIS — Z79899 Other long term (current) drug therapy: Secondary | ICD-10-CM | POA: Diagnosis not present

## 2014-10-22 DIAGNOSIS — M47816 Spondylosis without myelopathy or radiculopathy, lumbar region: Secondary | ICD-10-CM | POA: Diagnosis not present

## 2014-10-22 MED ORDER — OXYCODONE-ACETAMINOPHEN 5-325 MG PO TABS: 1.0000 | ORAL_TABLET | ORAL | Status: DC | PRN

## 2014-10-22 MED ORDER — IBUPROFEN 800 MG PO TABS
800.0000 mg | ORAL_TABLET | Freq: Once | ORAL | Status: AC
  Administered 2014-10-22: 800 mg via ORAL
  Filled 2014-10-22: qty 1

## 2014-10-22 MED ORDER — DIAZEPAM 5 MG PO TABS
5.0000 mg | ORAL_TABLET | Freq: Once | ORAL | Status: AC
  Administered 2014-10-22: 5 mg via ORAL
  Filled 2014-10-22: qty 1

## 2014-10-22 MED ORDER — DIAZEPAM 2 MG PO TABS: 2.0000 mg | ORAL_TABLET | Freq: Three times a day (TID) | ORAL | Status: DC | PRN

## 2014-10-22 MED ORDER — OXYCODONE-ACETAMINOPHEN 5-325 MG PO TABS
2.0000 | ORAL_TABLET | Freq: Once | ORAL | Status: AC
  Administered 2014-10-22: 2 via ORAL
  Filled 2014-10-22: qty 2

## 2014-10-22 NOTE — ED Notes (Signed)
Pt from home reports that she bent over 5 days ago and felt a "pull" in her lower back. Pt has experienced increase in pain and has lost mobility w/o assistance. Pt denies hx of back pain and no urinary problems. Pt is A&OL and in NAD

## 2014-10-22 NOTE — Discharge Instructions (Signed)
Muscle Cramps and Spasms Take the medications as prescribed. Do not take if you are working or driving. Follow up with your doctor. Return to the ED if you develop new or worsening symptosm. Muscle cramps and spasms occur when a muscle or muscles tighten and you have no control over this tightening (involuntary muscle contraction). They are a common problem and can develop in any muscle. The most common place is in the calf muscles of the leg. Both muscle cramps and muscle spasms are involuntary muscle contractions, but they also have differences:   Muscle cramps are sporadic and painful. They may last a few seconds to a quarter of an hour. Muscle cramps are often more forceful and last longer than muscle spasms.  Muscle spasms may or may not be painful. They may also last just a few seconds or much longer. CAUSES  It is uncommon for cramps or spasms to be due to a serious underlying problem. In many cases, the cause of cramps or spasms is unknown. Some common causes are:   Overexertion.   Overuse from repetitive motions (doing the same thing over and over).   Remaining in a certain position for a long period of time.   Improper preparation, form, or technique while performing a sport or activity.   Dehydration.   Injury.   Side effects of some medicines.   Abnormally low levels of the salts and ions in your blood (electrolytes), especially potassium and calcium. This could happen if you are taking water pills (diuretics) or you are pregnant.  Some underlying medical problems can make it more likely to develop cramps or spasms. These include, but are not limited to:   Diabetes.   Parkinson disease.   Hormone disorders, such as thyroid problems.   Alcohol abuse.   Diseases specific to muscles, joints, and bones.   Blood vessel disease where not enough blood is getting to the muscles.  HOME CARE INSTRUCTIONS   Stay well hydrated. Drink enough water and fluids to  keep your urine clear or pale yellow.  It may be helpful to massage, stretch, and relax the affected muscle.  For tight or tense muscles, use a warm towel, heating pad, or hot shower water directed to the affected area.  If you are sore or have pain after a cramp or spasm, applying ice to the affected area may relieve discomfort.  Put ice in a plastic bag.  Place a towel between your skin and the bag.  Leave the ice on for 15-20 minutes, 03-04 times a day.  Medicines used to treat a known cause of cramps or spasms may help reduce their frequency or severity. Only take over-the-counter or prescription medicines as directed by your caregiver. SEEK MEDICAL CARE IF:  Your cramps or spasms get more severe, more frequent, or do not improve over time.  MAKE SURE YOU:   Understand these instructions.  Will watch your condition.  Will get help right away if you are not doing well or get worse. Document Released: 01/04/2002 Document Revised: 11/09/2012 Document Reviewed: 07/01/2012 [facility] Patient Information 2015 [place], [place]. This information is not intended to replace advice given to you by your health care provider. Make sure you discuss any questions you have with your health care provider.

## 2014-10-22 NOTE — ED Provider Notes (Signed)
CSN: [id number]     Arrival date & time 10/22/14  1209 History   First MD Initiated Contact with Patient 10/22/14 1224     Chief Complaint  Patient presents with  . Back Pain     (Consider location/radiation/quality/duration/timing/severity/associated sxs/prior Treatment) HPI Comments: Patient with remote history of breast and colon cancer presenting with right-sided low back pain onset 6 days ago while she was bending over to lower the toilet seat and felt a pull in her back. Denies any back problems before this. She had an MRI 4 days ago for her shoulder and her pain worsened after laying on the table. She denies any radiation into her buttock or legs. No bowel or bladder incontinence. No fever or vomiting. No focal weakness, numbness or tingling. No chest pain or shortness of breath. She took some Aleve at home without relief. She denies any previous history of spinal problems.  The history is provided by the patient.    Past Medical History  Diagnosis Date  . HEMORRHOIDS   . Hypertension   . Anemia   . Axillary mass 7/99    Left  . Cervical spinal stenosis   . Headache(784.0)   . Blood transfusion without reported diagnosis   . CARCINOMA, BREAST 1995    left  . COLON CANCER 2006    T3 N0 tumor  . Barrett's esophagus    Past Surgical History  Procedure Laterality Date  . Bone marrow transplant  1999  . Total abdominal hysterectomy w/ bilateral salpingoophorectomy  1996  . Hemicolectomy  01/2005    right  . Breast lumpectomy  11/95    left breast  . Inguinal hernia repair    . Ercp  01/21/2012    Procedure: ENDOSCOPIC RETROGRADE CHOLANGIOPANCREATOGRAPHY (ERCP);  Surgeon: Lafayette Dragon, MD;  Location: [facility];  Service: Endoscopy;  Laterality: [name];  . Neck mass excision Left 7/99  . Hysteroscopy  08/30/94  . Tunneled venous catheter placement  9/98  . Bunionectomy  3/09, 10/09    right and left  . Rotator cuff repair Right 09/08/13    full tear  . Colon surgery     . Colonoscopy     Family History  Problem Relation Age of Onset  . Uterine cancer Mother   . Heart attack Father   . Colon cancer Neg Hx   . Esophageal cancer Neg Hx   . Stomach cancer Neg Hx   . Rectal cancer Neg Hx    History  Substance Use Topics  . Smoking status: Former Smoker    Quit date: 12/20/1990  . Smokeless tobacco: Never Used  . Alcohol Use: 0.0 - 0.5 oz/week    0-1 Standard drinks or equivalent per week     Comment: occasionally   OB History    Gravida Para Term Preterm AB TAB SAB Ectopic Multiple Living   3 2        2      Review of Systems  Constitutional: Negative for fever, activity change and appetite change.  HENT: Negative for congestion and rhinorrhea.   Respiratory: Negative for cough, chest tightness and shortness of breath.   Cardiovascular: Negative for chest pain.  Gastrointestinal: Negative for nausea, vomiting and abdominal pain.  Genitourinary: Negative for dysuria and hematuria.  Musculoskeletal: Positive for myalgias, back pain and arthralgias.  Skin: Negative for rash.  Neurological: Negative for dizziness, weakness, light-headedness, numbness and headaches.  A complete 10 system review of systems was obtained and all systems are  negative except as noted in the HPI and PMH.      Allergies  Levofloxacin and Tape  Home Medications   Prior to Admission medications   Medication Sig Start Date End Date Taking? Authorizing Provider  calcium carbonate (OS-CAL) 600 MG TABS tablet Take 600 mg by mouth daily.    Yes Historical Provider, MD  letrozole (FEMARA) 2.5 MG tablet Take 2.5 mg by mouth daily.     Yes Historical Provider, MD  losartan (COZAAR) 50 MG tablet Take 50 mg by mouth daily.   Yes Historical Provider, MD  Multiple Vitamin (MULTIVITAMIN WITH MINERALS) TABS tablet Take 1 tablet by mouth daily.   Yes Historical Provider, MD  naproxen sodium (ANAPROX) 220 MG tablet Take 440 mg by mouth 2 (two) times daily as needed (pain).   Yes  Historical Provider, MD  ranitidine (ZANTAC) 150 MG tablet Take 1 tablet (150 mg total) by mouth at bedtime. 10/11/14  Yes Lafayette Dragon, MD  Vitamin D, Ergocalciferol, (DRISDOL) 50000 UNITS CAPS Take 50,000 Units by mouth every 7 (seven) days. Wednesday. 08/28/11  Yes Historical Provider, MD  diazepam (VALIUM) 2 MG tablet Take 1 tablet (2 mg total) by mouth every 8 (eight) hours as needed for muscle spasms. 10/22/14   Ezequiel Essex, MD  oxyCODONE-acetaminophen (PERCOCET/ROXICET) 5-325 MG per tablet Take 1 tablet by mouth every 4 (four) hours as needed for severe pain. 10/22/14   Ezequiel Essex, MD   BP 134/73 mmHg  Pulse 66  Temp(Src) 97.7 F (36.5 C) (Oral)  Resp 18  SpO2 99%  LMP 07/30/1995 Physical Exam  Constitutional: She is oriented to person, place, and time. She appears well-developed and well-nourished. No distress.  HENT:  Head: Normocephalic and atraumatic.  Mouth/Throat: Oropharynx is clear and moist. No oropharyngeal exudate.  Eyes: Conjunctivae and EOM are normal. Pupils are equal, round, and reactive to light.  Neck: Normal range of motion. Neck supple.  No meningismus.  Cardiovascular: Normal rate, regular rhythm, normal heart sounds and intact distal pulses.   No murmur heard. Pulmonary/Chest: Effort normal and breath sounds normal. No respiratory distress.  Abdominal: Soft. There is no tenderness. There is no rebound and no guarding.  Musculoskeletal: Normal range of motion. She exhibits tenderness. She exhibits no edema.  Right paraspinal thoracic and lumbar tenderness 5/5 strength in bilateral lower extremities. Ankle plantar and dorsiflexion intact. Great toe extension intact bilaterally. +2 DP and PT pulses. +2 patellar reflexes bilaterally. Normal gait.   Neurological: She is alert and oriented to person, place, and time. No cranial nerve deficit. She exhibits normal muscle tone. Coordination normal.  No ataxia on finger to nose bilaterally. No pronator drift. 5/5  strength throughout. CN 2-12 intact. Negative Romberg. Equal grip strength. Sensation intact. Gait is normal.   Skin: Skin is warm.  Psychiatric: She has a normal mood and affect. Her behavior is normal.  Nursing note and vitals reviewed.   ED Course  Procedures (including critical care time) Labs Review Labs Reviewed - No data to display  Imaging Review Dg Thoracic Spine 2 View  10/22/2014   CLINICAL DATA:  Dorsalgia for 5 days  EXAM: THORACIC SPINE - 3 VIEW  COMPARISON:  None.  FINDINGS: Frontal, lateral, and swimmer's views were obtained. There is slight upper thoracic levoscoliosis. There is no fracture or spondylolisthesis. Disc spaces appear intact. No erosive change.  IMPRESSION: Slight scoliosis. No fracture or spondylolisthesis. No appreciable arthropathic change.   Electronically Signed   By: Lowella Grip III M.D.  On: 10/22/2014 13:27   Dg Lumbar Spine Complete  10/22/2014   CLINICAL DATA:  Patient with low back pain. Patient bent forward 5 days ago and felt a pop within her lower back.  EXAM: LUMBAR SPINE - COMPLETE 4+ VIEW  COMPARISON:  CT 12/30/2013  FINDINGS: Normal anatomic alignment. Preservation of the vertebral body and intervertebral disc space heights. L1-2 degenerative disc disease with disc space height loss and anterior endplate osteophytosis. L4-5 and L5-S1 facet degenerative change. SI joints are unremarkable. On the oblique view there is suggestion of a linear lucency through the L5 facet.  IMPRESSION: Suggestion of a linear lucency through the L5 facet on the oblique view, nonspecific, potentially secondary to degenerative changes or nondisplaced fracture. Consider correlation with CT imaging.   Electronically Signed   By: Lovey Newcomer M.D.   On: 10/22/2014 13:37   Ct Renal Stone Study  10/22/2014   CLINICAL DATA:  Flank pain, right greater than left, symptoms for 6 days. History of right hemicolectomy for colon cancer and remote lumpectomy for breast cancer.   EXAM: CT ABDOMEN AND PELVIS WITHOUT CONTRAST  TECHNIQUE: Multidetector CT imaging of the abdomen and pelvis was performed following the standard protocol without IV contrast.  COMPARISON:  Lumbar spine radiographs same date, most recent CT abdomen/ pelvis dated 12/30/2013  FINDINGS: Lower chest:  Lung bases are clear.  Hepatobiliary: Unenhanced liver and gallbladder appear unremarkable.  Pancreas: Normal  Spleen: Normal  Adrenals/Urinary Tract: Adrenal glands are normal. Left-sided renal parapelvic cysts incidentally noted. No hydroureteronephrosis. No radiopaque renal, ureteral, or bladder calculus.  Stomach/Bowel: Evidence of right hemicolectomy noted without mass lesion at the resection margin. Stomach is unremarkable. No bowel wall thickening or focal segmental dilatation is identified.  Vascular/Lymphatic: Mild atheromatous aortic calcification without aneurysm. No lymphadenopathy.  Reproductive: Uterus and ovaries surgically absent. No adnexal mass.  Other: No free air or fluid.  Musculoskeletal: No acute osseous abnormality.  IMPRESSION: No acute intra-abdominal or pelvic pathology.   Electronically Signed   By: Conchita Paris M.D.   On: 10/22/2014 13:45     EKG Interpretation None      MDM   Final diagnoses:  Flank pain  Back spasm    Right-sided low back pain, likely muscle strain or spasm. No focal weakness, numbness or tingling. No fevers or vomiting. No evidence of cord compression or cauda equina. Intact distal pulses.  Possible lucency of L5 linear facet.  CT d/w Dr Nyoka Cowden.  No suggestion of L5 fracture on CT, likely degenerative change.  Symptoms improved with pain meds, muscle relaxers.  She is ambulatory.  BP has improved. Suspect muscle spasm. Patient encouraged to use anti-inflammatories at home, pain control, muscle relaxers. Cautioned not to drive when taking these medications. Follow-up with PCP this week. Return precautions discussed.  BP 134/73 mmHg  Pulse 66   Temp(Src) 97.7 F (36.5 C) (Oral)  Resp 18  SpO2 99%  LMP 07/30/1995    Ezequiel Essex, MD 10/22/14 1640

## 2014-10-25 DIAGNOSIS — C189 Malignant neoplasm of colon, unspecified: Secondary | ICD-10-CM | POA: Diagnosis not present

## 2014-10-25 DIAGNOSIS — Z0000 Encounter for general adult medical examination without abnormal findings: Secondary | ICD-10-CM | POA: Diagnosis not present

## 2014-10-25 DIAGNOSIS — C50919 Malignant neoplasm of unspecified site of unspecified female breast: Secondary | ICD-10-CM | POA: Diagnosis not present

## 2014-10-25 DIAGNOSIS — M549 Dorsalgia, unspecified: Secondary | ICD-10-CM | POA: Diagnosis not present

## 2014-10-25 DIAGNOSIS — Z23 Encounter for immunization: Secondary | ICD-10-CM | POA: Diagnosis not present

## 2014-10-25 DIAGNOSIS — K219 Gastro-esophageal reflux disease without esophagitis: Secondary | ICD-10-CM | POA: Diagnosis not present

## 2014-10-25 DIAGNOSIS — M859 Disorder of bone density and structure, unspecified: Secondary | ICD-10-CM | POA: Diagnosis not present

## 2014-10-25 DIAGNOSIS — E785 Hyperlipidemia, unspecified: Secondary | ICD-10-CM | POA: Diagnosis not present

## 2014-10-25 DIAGNOSIS — E559 Vitamin D deficiency, unspecified: Secondary | ICD-10-CM | POA: Diagnosis not present

## 2014-10-25 DIAGNOSIS — I10 Essential (primary) hypertension: Secondary | ICD-10-CM | POA: Diagnosis not present

## 2014-10-25 DIAGNOSIS — M1990 Unspecified osteoarthritis, unspecified site: Secondary | ICD-10-CM | POA: Diagnosis not present

## 2014-11-03 ENCOUNTER — Ambulatory Visit: Payer: Medicare Other | Attending: Family Medicine

## 2014-11-03 DIAGNOSIS — M545 Low back pain, unspecified: Secondary | ICD-10-CM

## 2014-11-03 DIAGNOSIS — M2560 Stiffness of unspecified joint, not elsewhere classified: Secondary | ICD-10-CM | POA: Diagnosis not present

## 2014-11-03 DIAGNOSIS — R6889 Other general symptoms and signs: Secondary | ICD-10-CM | POA: Insufficient documentation

## 2014-11-03 DIAGNOSIS — R293 Abnormal posture: Secondary | ICD-10-CM | POA: Diagnosis not present

## 2014-11-03 NOTE — Patient Instructions (Signed)
Continue stretching cat camel and flexion exercises at home.

## 2014-11-03 NOTE — Therapy (Signed)
[place], [place], [address] Phone: [phone]   Fax:  [phone]  Physical Therapy Evaluation  Patient Details  Name: Eileen [name] MRN: [id number] Date of Birth: 16-Feb-1946 Referring Provider:  Shirline Frees, MD  Encounter Date: 11/03/2014      PT End of Session - 11/03/14 1531    Visit Number 1   Number of Visits 12   Date for PT Re-Evaluation 12/15/14   PT Start Time 0300   PT Stop Time 0355   PT Time Calculation (min) 55 min   Activity Tolerance Patient tolerated treatment well   Behavior During Therapy [facility] for tasks assessed/performed      Past Medical History  Diagnosis Date  . HEMORRHOIDS   . Hypertension   . Anemia   . Axillary mass 7/99    Left  . Cervical spinal stenosis   . Headache(784.0)   . Blood transfusion without reported diagnosis   . CARCINOMA, BREAST 1995    left  . COLON CANCER 2006    T3 N0 tumor  . Barrett's esophagus     Past Surgical History  Procedure Laterality Date  . Bone marrow transplant  1999  . Total abdominal hysterectomy w/ bilateral salpingoophorectomy  1996  . Hemicolectomy  01/2005    right  . Breast lumpectomy  11/95    left breast  . Inguinal hernia repair    . Ercp  01/21/2012    Procedure: ENDOSCOPIC RETROGRADE CHOLANGIOPANCREATOGRAPHY (ERCP);  Surgeon: Lafayette Dragon, MD;  Location: [facility];  Service: Endoscopy;  Laterality: [name];  . Neck mass excision Left 7/99  . Hysteroscopy  08/30/94  . Tunneled venous catheter placement  9/98  . Bunionectomy  3/09, 10/09    right and left  . Rotator cuff repair Right 09/08/13    full tear  . Colon surgery    . Colonoscopy      There were no vitals filed for this visit.  Visit Diagnosis:  Bilateral low back pain without sciatica - Plan: PT plan of care cert/re-cert  Abnormal posture - Plan: PT plan of care cert/re-cert  Joint stiffness of spine - Plan: PT plan of care  cert/re-cert  Activity intolerance - Plan: PT plan of care cert/re-cert      Subjective Assessment - 11/03/14 1506    Subjective LBP across lower back 2 weeks ago she leaned over to lower toilet seat. The pain worse after lying in MRI machine tuesday after incident    Pertinent History She was in ED and was medicated and returned home. The medication does not help.    How long can you sit comfortably? hours but hard to get up   How long can you stand comfortably? hours    How long can you walk comfortably? As needed   Diagnostic tests MRI: the shoulder, xray, CT scan   Patient Stated Goals Eliminate pain.    Currently in Pain? Yes   Pain Score 9    Pain Location Back   Pain Orientation Lower   Pain Type Acute pain   Pain Onset 1 to 4 weeks ago   Pain Frequency Constant   Aggravating Factors  standing   Pain Relieving Factors Nothing, a little stretch eases some   Multiple Pain Sites No            [facility] PT Assessment - 11/03/14 1514    Assessment   Medical Diagnosis lower back pain   Onset Date --  2-3 weeks ago   Precautions   Precautions None   Restrictions   Weight Bearing Restrictions No   Balance Screen   Has the patient fallen in the past 6 months No   Has the patient had a decrease in activity level because of a fear of falling?  No   Is the patient reluctant to leave their home because of a fear of falling?  No   Prior Function   Level of Independence Independent with basic ADLs   Cognition   Overall Cognitive Status Within Functional Limits for tasks assessed   Posture/Postural Control   Posture Comments Scoliosis with RT lateral shift and decreased lumba lordosis   AROM   AROM Assessment Site Lumbar   Strength   Overall Strength Within functional limits for tasks performed     Lumbar flexion she can reach to 6 inches above toes, extension decr 80%, side bending 30 degrees bilaterally.          LE ROM normal  Palpation tender across lower to mid lumbar  spine.            [facility] PT Treatment/Exercise - 11/03/14 1514    Modalities   Modalities Electrical Stimulation;Moist Heat   Moist Heat Therapy   Number Minutes Moist Heat 20 Minutes   Moist Heat Location --  back   Electrical Stimulation   Electrical Stimulation Location back,lower   Electrical Stimulation Action IFC   Electrical Stimulation Parameters L12   Electrical Stimulation Goals Pain                PT Education - 11/03/14 1531    Education provided Yes   Education Details POC   Person(s) Educated Patient   Methods Explanation;Verbal cues   Comprehension Verbalized understanding          PT Short Term Goals - 11/03/14 1534    PT SHORT TERM GOAL #1   Title She will be independent with inital HEP   Time 3   Period Weeks   Status New   PT SHORT TERM GOAL #2   Title she will report pain improve 50% with all activity   Time 3   Period Weeks   Status New   PT SHORT TERM GOAL #3   Title She will report decreased pain  with transitional movements (ex: sit to stand)   Time 3   Period Weeks   Status New           PT Long Term Goals - 11/03/14 1536    PT LONG TERM GOAL #1   Title She will be independent with all hEP issued as of last visit   Time 6   Period Weeks   Status New   PT LONG TERM GOAL #2   Title Pain will become intermittant and decreased 80% or more  with all activity   Time 6   Period Weeks   Status New               Plan - 11/03/14 1532    Clinical Impression Statement She has normal strength and LE flexibility. Trunk stiffness and abnormal posture. She may need medication change as current meds are  not helping. We should be able to ease pain   Pt will benefit from skilled therapeutic intervention in order to improve on the following deficits Pain;Decreased activity tolerance;Postural dysfunction;Decreased range of motion   Rehab Potential Good   PT Frequency 2x / week   PT Duration 6  weeks   PT  Treatment/Interventions Electrical Stimulation;Moist Heat;Ultrasound;Therapeutic exercise;Patient/family education;Passive range of motion;Manual techniques   PT Next Visit Plan Trunk stretching , STW/mobs, modalities, manual traction trial   Consulted and Agree with Plan of Care Patient          G-Codes - Nov 16, 2014 1538    Functional Limitation Changing and maintaining body position   Changing and Maintaining Body Position Current Status (M4680) At least 40 percent but less than 60 percent impaired, limited or restricted   Changing and Maintaining Body Position Goal Status (H2122) At least 20 percent but less than 40 percent impaired, limited or restricted       Problem List Patient Active Problem List   Diagnosis Date Noted  . Disorder of rotator cuff 08/31/2013  . Abdominal pain, epigastric 08/24/2013  . Belching 08/24/2013  . Atypical chest pain 08/24/2013  . Other abnormal blood chemistry 01/21/2012  . Nonspecific (abnormal) findings on radiological and other examination of biliary tract 01/21/2012  . TIA (transient ischemic attack) 11/05/2011  . Syncope and collapse 11/05/2011  . Dehydration 11/05/2011  . UTI (lower urinary tract infection) 11/05/2011  . Generalized weakness 11/05/2011  . Numbness in both legs 11/05/2011  . PVC's (premature ventricular contractions) 10/11/2011  . Nonspecific abnormal unspecified cardiovascular function study 12/20/2010  . HYPERTENSION, BENIGN 01/20/2009  . CHEST PAIN-PRECORDIAL 01/20/2009  . CARCINOMA, BREAST 08/23/2008  . HEMORRHOIDS 08/23/2008  . CONSTIPATION, CHRONIC 08/23/2008  . RECTAL BLEEDING 08/23/2008  . ABDOMINAL PAIN-RLQ 08/23/2008  . PERSONAL HX COLON CANCER 08/23/2008  . PERSONAL HX BREAST CANCER 08/23/2008  . COLON CANCER 01/26/2005    Darrel Hoover PT 11/16/14, 3:41 PM  [facility] [address] [place], [place], [address] Phone: [phone]   Fax:   [phone]

## 2014-11-08 ENCOUNTER — Ambulatory Visit: Payer: Medicare Other | Admitting: Physical Therapy

## 2014-11-08 DIAGNOSIS — M545 Low back pain, unspecified: Secondary | ICD-10-CM

## 2014-11-08 DIAGNOSIS — M2560 Stiffness of unspecified joint, not elsewhere classified: Secondary | ICD-10-CM | POA: Diagnosis not present

## 2014-11-08 DIAGNOSIS — R293 Abnormal posture: Secondary | ICD-10-CM

## 2014-11-08 DIAGNOSIS — R6889 Other general symptoms and signs: Secondary | ICD-10-CM

## 2014-11-08 NOTE — Therapy (Addendum)
[facility] [place], [place], [address] Phone: [phone]   Fax:  [phone]  Physical Therapy Treatment  Patient Details  Name: Eileen [name] MRN: [id number] Date of Birth: 1946-05-31 Referring Provider:  Shirline Frees, MD  Encounter Date: 11/08/2014      PT End of Session - 11/08/14 1103    Visit Number 2   Number of Visits 12   Date for PT Re-Evaluation 12/15/14   PT Start Time 8756   PT Stop Time 1110   PT Time Calculation (min) 55 min      Past Medical History  Diagnosis Date  . HEMORRHOIDS   . Hypertension   . Anemia   . Axillary mass 7/99    Left  . Cervical spinal stenosis   . Headache(784.0)   . Blood transfusion without reported diagnosis   . CARCINOMA, BREAST 1995    left  . COLON CANCER 2006    T3 N0 tumor  . Barrett's esophagus     Past Surgical History  Procedure Laterality Date  . Bone marrow transplant  1999  . Total abdominal hysterectomy w/ bilateral salpingoophorectomy  1996  . Hemicolectomy  01/2005    right  . Breast lumpectomy  11/95    left breast  . Inguinal hernia repair    . Ercp  01/21/2012    Procedure: ENDOSCOPIC RETROGRADE CHOLANGIOPANCREATOGRAPHY (ERCP);  Surgeon: Lafayette Dragon, MD;  Location: [facility];  Service: Endoscopy;  Laterality: [name];  . Neck mass excision Left 7/99  . Hysteroscopy  08/30/94  . Tunneled venous catheter placement  9/98  . Bunionectomy  3/09, 10/09    right and left  . Rotator cuff repair Right 09/08/13    full tear  . Colon surgery    . Colonoscopy      There were no vitals filed for this visit.  Visit Diagnosis:  Bilateral low back pain without sciatica  Abnormal posture  Joint stiffness of spine  Activity intolerance      Subjective Assessment - 11/08/14 1017    Subjective Pt. reports a significant decrease in pain.  She reported a 2/10 pain in her lower back before and after treatment.  She reported that the e-stim/  moist heat decreased her pain and kept it down.  She said she is going to the gym 5 days a week to stay healthy, as she is a 3 time cancer survivor. .     Currently in Pain? Yes   Pain Score 2    Pain Location Back   Pain Orientation Lower;Right;Left   Pain Descriptors / Indicators Sore                       [facility] Adult PT Treatment/Exercise - 11/08/14 1104    Lumbar Exercises: Stretches   Press Ups 5 reps;10 seconds   Press Ups Limitations cues for proper technique and control   Lumbar Exercises: Aerobic   Stationary Bike Nu step level 5 x 5 minutes   Lumbar Exercises: Supine   Ab Set 10 reps;5 seconds   Clam 10 reps   Heel Slides 15 reps   Heel Slides Limitations tactile cues for hip control   Bent Knee Raise 10 reps;2 seconds   Bent Knee Raise Limitations also Scissors level 2    Dead Bug --   Lumbar Exercises: Quadruped   Madcat/Old Horse 5 reps   Single Arm Raise 10 reps;3 seconds  cues to control hips  Straight Leg Raise 10 reps;3 seconds   Opposite Arm/Leg Raise 5 reps;3 seconds   Modalities   Modalities Electrical Stimulation;Moist Heat   Moist Heat Therapy   Number Minutes Moist Heat 15 Minutes   Electrical Stimulation   Electrical Stimulation Location back,lower   Electrical Stimulation Action IFC   Electrical Stimulation Parameters L12   Electrical Stimulation Goals Pain                PT Education - 11/08/14 1154    Education Details SELF CARE: Supine-sit transfers x3 with cues for proper log roll to decrease strain on spine; discontinue touching toes for hamstring stretch, proper technique for stretches; Prone on elbows and prone press ups HEP   Person(s) Educated Patient   Methods Explanation;Demonstration;Verbal cues   Comprehension Returned demonstration;Verbal cues required;Verbalized understanding          PT Short Term Goals - 11/03/14 1534    PT SHORT TERM GOAL #1   Title She will be independent with inital HEP   Time 3    Period Weeks   Status New   PT SHORT TERM GOAL #2   Title she will report pain improve 50% with all activity   Time 3   Period Weeks   Status New   PT SHORT TERM GOAL #3   Title She will report decreased pain  with transitional movements (ex: sit to stand)   Time 3   Period Weeks   Status New           PT Long Term Goals - 11/03/14 1536    PT LONG TERM GOAL #1   Title She will be independent with all hEP issued as of last visit   Time 6   Period Weeks   Status New   PT LONG TERM GOAL #2   Title Pain will become intermittant and decreased 80% or more  with all activity   Time 6   Period Weeks   Status New               Plan - 11/08/14 1112    Clinical Impression Statement Pt. has improved greatly with a siginificant decrease in her pain level after e-stim and moist heat was applied last treatment.  Pain was 2/10 before and after exercises today per pt. report.  She appears to be less stiff and more active, as she was able to progress to supine and quadruped lumbar stabilization.  Pt. is progressing quickly.     PT Next Visit Plan emphasize good quadruped stabilization technique, trunk stretching/strengthening, recheck lumbar extension AROM, body mechanics/posture        Problem List Patient Active Problem List   Diagnosis Date Noted  . Disorder of rotator cuff 08/31/2013  . Abdominal pain, epigastric 08/24/2013  . Belching 08/24/2013  . Atypical chest pain 08/24/2013  . Other abnormal blood chemistry 01/21/2012  . Nonspecific (abnormal) findings on radiological and other examination of biliary tract 01/21/2012  . TIA (transient ischemic attack) 11/05/2011  . Syncope and collapse 11/05/2011  . Dehydration 11/05/2011  . UTI (lower urinary tract infection) 11/05/2011  . Generalized weakness 11/05/2011  . Numbness in both legs 11/05/2011  . PVC's (premature ventricular contractions) 10/11/2011  . Nonspecific abnormal unspecified cardiovascular function  study 12/20/2010  . HYPERTENSION, BENIGN 01/20/2009  . CHEST PAIN-PRECORDIAL 01/20/2009  . CARCINOMA, BREAST 08/23/2008  . HEMORRHOIDS 08/23/2008  . CONSTIPATION, CHRONIC 08/23/2008  . RECTAL BLEEDING 08/23/2008  . ABDOMINAL PAIN-RLQ 08/23/2008  . PERSONAL HX  COLON CANCER 08/23/2008  . PERSONAL HX BREAST CANCER 08/23/2008  . COLON CANCER 01/26/2005    Talia Hoheisel,McKinnley, SPTA 11/08/2014, 12:28 PM  Hessie Diener, PTA 11/08/2014 12:28 PM Phone: [phone] Fax: [facility] [place], [place], [address] Phone: [phone]   Fax:  [phone]

## 2014-11-08 NOTE — Patient Instructions (Addendum)
Elbow Prop (Extension)   Prop body up on elbows for __60__ seconds. Slowly lower it. Repeat __10__ times. Do __2__ sessions per day.  http://gt2.exer.us/243   Copyright  VHI. All rights reserved.  Back Hyperextension: Using Arms   Lying face down with arms bent, inhale. Then while exhaling, straighten arms. Hold __10__ seconds. Slowly return to starting position. Repeat __5__ times per set. Do _1___ sets per session. Do __2__ sessions per day.

## 2014-11-09 ENCOUNTER — Ambulatory Visit: Payer: Medicare Other | Admitting: Physical Therapy

## 2014-11-09 DIAGNOSIS — R293 Abnormal posture: Secondary | ICD-10-CM

## 2014-11-09 DIAGNOSIS — R6889 Other general symptoms and signs: Secondary | ICD-10-CM | POA: Diagnosis not present

## 2014-11-09 DIAGNOSIS — M2560 Stiffness of unspecified joint, not elsewhere classified: Secondary | ICD-10-CM

## 2014-11-09 DIAGNOSIS — M545 Low back pain, unspecified: Secondary | ICD-10-CM

## 2014-11-09 NOTE — Therapy (Signed)
[place], [place], [address] Phone: [phone]   Fax:  [phone]  Physical Therapy Treatment  Patient Details  Name: Eileen [name] MRN: [id number] Date of Birth: 07-Oct-1945 Referring Provider:  Shirline Frees, MD  Encounter Date: 11/09/2014      PT End of Session - 11/09/14 1158    Visit Number 3   Number of Visits 12   Date for PT Re-Evaluation 12/15/14   PT Start Time 0932   PT Stop Time 1034   PT Time Calculation (min) 62 min      Past Medical History  Diagnosis Date  . HEMORRHOIDS   . Hypertension   . Anemia   . Axillary mass 7/99    Left  . Cervical spinal stenosis   . Headache(784.0)   . Blood transfusion without reported diagnosis   . CARCINOMA, BREAST 1995    left  . COLON CANCER 2006    T3 N0 tumor  . Barrett's esophagus     Past Surgical History  Procedure Laterality Date  . Bone marrow transplant  1999  . Total abdominal hysterectomy w/ bilateral salpingoophorectomy  1996  . Hemicolectomy  01/2005    right  . Breast lumpectomy  11/95    left breast  . Inguinal hernia repair    . Ercp  01/21/2012    Procedure: ENDOSCOPIC RETROGRADE CHOLANGIOPANCREATOGRAPHY (ERCP);  Surgeon: Lafayette Dragon, MD;  Location: [facility];  Service: Endoscopy;  Laterality: [name];  . Neck mass excision Left 7/99  . Hysteroscopy  08/30/94  . Tunneled venous catheter placement  9/98  . Bunionectomy  3/09, 10/09    right and left  . Rotator cuff repair Right 09/08/13    full tear  . Colon surgery    . Colonoscopy      There were no vitals filed for this visit.  Visit Diagnosis:  Abnormal posture  Bilateral low back pain without sciatica  Joint stiffness of spine  Activity intolerance      Subjective Assessment - 11/09/14 0930    Subjective Pt. reports no pain in her low back, just a little sore/tired from yesterday's treatment.  She reported R shoulder discomfort and asked not to be  put in the quadruped position.  The pt. stated that she did not have a pain increase during exercise.     Currently in Pain? No/denies            [facility] PT Assessment - 11/09/14 1206    Strength   Right Hip Flexion 4+/5   Right Hip Extension 4+/5   Right Hip ABduction 4+/5   Right Hip ADduction 4+/5   Left Hip Flexion 4+/5   Left Hip ABduction 4+/5   Left Hip ADduction 4+/5                   [facility] Adult PT Treatment/Exercise - 11/09/14 0953    Lumbar Exercises: Stretches   Active Hamstring Stretch 2 reps;30 seconds   Single Knee to Chest Stretch 2 reps;30 seconds  left and right   Lumbar Exercises: Aerobic   Stationary Bike Nu step level 5 x 5 minutes   Lumbar Exercises: Supine   Clam 20 reps;2 seconds   Heel Slides 10 reps  cues to lower leg   Bent Knee Raise 10 reps;2 seconds   Bent Knee Raise Limitations also scissors level 2; cues for abdominal bracing  pt. had trouble coordinating movement   Bridge 5 reps;3 seconds  cues for abdominal bracing and control   Straight Leg Raise 10 reps  left and right   Straight Leg Raises Limitations cues for abdominal bracing   Lumbar Exercises: Sidelying   Hip Abduction 10 reps;2 seconds  left and right   Lumbar Exercises: Quadruped   Other Quadruped Lumbar Exercises QL stretch for scoliosis laying on right side w/ towel.  arm above head.  60 seconds   Modalities   Modalities Electrical Stimulation;Moist Heat   Moist Heat Therapy   Number Minutes Moist Heat 15 Minutes   Moist Heat Location --  lumbar supine   Electrical Stimulation   Electrical Stimulation Location low back   Electrical Stimulation Action IFC   Electrical Stimulation Parameters L10   Electrical Stimulation Goals Pain                PT Education - 11/09/14 1159    Education Details Sidelying QL stretch w/ towel for scoliosis HEP   Person(s) Educated Patient   Methods Explanation;Demonstration;Verbal cues;Handout   Comprehension  Verbalized understanding;Returned demonstration;Verbal cues required          PT Short Term Goals - 11/03/14 1534    PT SHORT TERM GOAL #1   Title She will be independent with inital HEP   Time 3   Period Weeks   Status New   PT SHORT TERM GOAL #2   Title she will report pain improve 50% with all activity   Time 3   Period Weeks   Status New   PT SHORT TERM GOAL #3   Title She will report decreased pain  with transitional movements (ex: sit to stand)   Time 3   Period Weeks   Status New           PT Long Term Goals - 11/03/14 1536    PT LONG TERM GOAL #1   Title She will be independent with all hEP issued as of last visit   Time 6   Period Weeks   Status New   PT LONG TERM GOAL #2   Title Pain will become intermittant and decreased 80% or more  with all activity   Time 6   Period Weeks   Status New               Plan - 11/09/14 1203    Clinical Impression Statement Pt. came to therapy with no pain, just sore from yesterday.  She continued with supine lumbar stabilization, but was not put into quadruped position due to c/o pain in the R shoulder (due to recent rotator cuff surgery).  Bilateral hip strength was assesed and presented as a 4+/5 for right and left hip flexion, extension, abduction, and adduction.  The pt. was able to begin bridges and slidelying hip abduction to strengthen these muscles.       PT Next Visit Plan continue to emphasize abdominal control, QL stretching/strengthening for scoliosis, progress bridging, Recheck lumbar extension AROM        Problem List Patient Active Problem List   Diagnosis Date Noted  . Disorder of rotator cuff 08/31/2013  . Abdominal pain, epigastric 08/24/2013  . Belching 08/24/2013  . Atypical chest pain 08/24/2013  . Other abnormal blood chemistry 01/21/2012  . Nonspecific (abnormal) findings on radiological and other examination of biliary tract 01/21/2012  . TIA (transient ischemic attack) 11/05/2011  .  Syncope and collapse 11/05/2011  . Dehydration 11/05/2011  . UTI (lower urinary tract infection) 11/05/2011  . Generalized weakness 11/05/2011  .  Numbness in both legs 11/05/2011  . PVC's (premature ventricular contractions) 10/11/2011  . Nonspecific abnormal unspecified cardiovascular function study 12/20/2010  . HYPERTENSION, BENIGN 01/20/2009  . CHEST PAIN-PRECORDIAL 01/20/2009  . CARCINOMA, BREAST 08/23/2008  . HEMORRHOIDS 08/23/2008  . CONSTIPATION, CHRONIC 08/23/2008  . RECTAL BLEEDING 08/23/2008  . ABDOMINAL PAIN-RLQ 08/23/2008  . PERSONAL HX COLON CANCER 08/23/2008  . PERSONAL HX BREAST CANCER 08/23/2008  . COLON CANCER 01/26/2005    Taesean Reth,McKinnley, SPTA 11/09/2014, 12:12 PM  [facility] [place], [place], [address] Phone: [phone]   Fax:  [phone]

## 2014-11-09 NOTE — Patient Instructions (Signed)
Sidelying QL stretch with towel

## 2014-11-15 ENCOUNTER — Ambulatory Visit: Payer: Medicare Other

## 2014-11-15 DIAGNOSIS — M545 Low back pain, unspecified: Secondary | ICD-10-CM

## 2014-11-15 DIAGNOSIS — M2560 Stiffness of unspecified joint, not elsewhere classified: Secondary | ICD-10-CM | POA: Diagnosis not present

## 2014-11-15 DIAGNOSIS — R6889 Other general symptoms and signs: Secondary | ICD-10-CM | POA: Diagnosis not present

## 2014-11-15 DIAGNOSIS — R293 Abnormal posture: Secondary | ICD-10-CM | POA: Diagnosis not present

## 2014-11-15 NOTE — Patient Instructions (Signed)
Hip Extension: Hamstring Single Leg Deadlift (Eccentric)   Holding weights, stand on affected leg with knee slightly flexed. Lift other leg while slowly bending forward at the hip. Use ___ lb weight. ___ reps per set, ___ sets per day, ___ days per week. Add ___ lbs when you achieve ___ repetitions. Touch floor with weight.  Copyright  VHI. All rights reserved.  Squat: Wide Leg   Feet wide apart, toes out, squat, holding weight between knees. Weight should be at ankles.  Bend at the hips with hips going back behind hips. Keep back straight.  Put eight on stool if you cannot get to floor.  Repeat _3-15___ times per set. Do ___1-2_ sets per session. Do __2-5__ sessions per week. Use __0-10__ lb weight.  Copyright  VHI. All rights reserved.  Squat: Half   Arms hanging at sides, squat by dropping hips back as if sitting on a chair. Keep knees over ankles, hips behind heels.  Keep back straight.  Bend at hips and knees will bend with hips.   Repeat ___3-15_ times per set. Do __1-2__ sets per session. Do __2-5__ sessions per week. Use __0-10HIP / KNEE: Flexion / Extension, Squat Unilateral Hip / Glute Extension: Standing - Straight Leg (Machine) Hip Extension (Standing) Healthy Back - Yoga Tree Balance  

## 2014-11-15 NOTE — Therapy (Signed)
[place], [place], [address] Phone: [phone]   Fax:  [phone]  Physical Therapy Treatment  Patient Details  Name: Eileen [name] MRN: [id number] Date of Birth: May 19, 1946 Referring Provider:  Shirline Frees, MD  Encounter Date: 11/15/2014      PT End of Session - 11/15/14 1409    Visit Number 4   Number of Visits 12   Date for PT Re-Evaluation 12/15/14   PT Start Time 0125   PT Stop Time 0225   PT Time Calculation (min) 60 min   Activity Tolerance Patient tolerated treatment well  mild soreness after lifting mechanics      Past Medical History  Diagnosis Date  . HEMORRHOIDS   . Hypertension   . Anemia   . Axillary mass 7/99    Left  . Cervical spinal stenosis   . Headache(784.0)   . Blood transfusion without reported diagnosis   . CARCINOMA, BREAST 1995    left  . COLON CANCER 2006    T3 N0 tumor  . Barrett's esophagus     Past Surgical History  Procedure Laterality Date  . Bone marrow transplant  1999  . Total abdominal hysterectomy w/ bilateral salpingoophorectomy  1996  . Hemicolectomy  01/2005    right  . Breast lumpectomy  11/95    left breast  . Inguinal hernia repair    . Ercp  01/21/2012    Procedure: ENDOSCOPIC RETROGRADE CHOLANGIOPANCREATOGRAPHY (ERCP);  Surgeon: Lafayette Dragon, MD;  Location: [facility];  Service: Endoscopy;  Laterality: [name];  . Neck mass excision Left 7/99  . Hysteroscopy  08/30/94  . Tunneled venous catheter placement  9/98  . Bunionectomy  3/09, 10/09    right and left  . Rotator cuff repair Right 09/08/13    full tear  . Colon surgery    . Colonoscopy      There were no vitals filed for this visit.  Visit Diagnosis:  Bilateral low back pain without sciatica  Activity intolerance      Subjective Assessment - 11/15/14 1329    Subjective Almost no pain today   Currently in Pain? No/denies   Pain Score --  <1   Multiple Pain Sites No        Extension stretching activity reviewed but needed to do standing as her shoulder was painful in prone.   Extensive review of lifting mechanics and use of hip hinge and golfers lift and other options . Also reviewed physics of poor mechanics and effect on tissue                          PT Education - 11/15/14 1408    Education provided Yes   Education Details Estate agent) Educated Patient   Methods Explanation;Demonstration;Tactile cues;Verbal cues;Handout   Comprehension Returned demonstration;Verbalized understanding          PT Short Term Goals - 11/15/14 1411    PT SHORT TERM GOAL #1   Title She will be independent with inital HEP   Status Achieved   PT SHORT TERM GOAL #2   Title she will report pain improve 50% with all activity   Status Achieved   PT SHORT TERM GOAL #3   Title She will report decreased pain  with transitional movements (ex: sit to stand)   Status Achieved           PT Long Term  Goals - 11/03/14 1536    PT LONG TERM GOAL #1   Title She will be independent with all hEP issued as of last visit   Time 6   Period Weeks   Status New   PT LONG TERM GOAL #2   Title Pain will become intermittant and decreased 80% or more  with all activity   Time 6   Period Weeks   Status New               Plan - 11/15/14 1409    Clinical Impression Statement She is doing better and did well but not perfect with liftiing .  She could benefit from stab exercisesModified extension exercisdes to standing at counter and wall to ease pressure  to RT shoulder   PT Next Visit Plan continue to emphasize abdominal control, QL stretching/strengthening for scoliosis, progress bridging, Recheck lumbar extension AROM   PT Home Exercise Plan add stab to HEP   Consulted and Agree with Plan of Care Patient        Problem List Patient Active Problem List   Diagnosis Date Noted  . Disorder of rotator cuff 08/31/2013   . Abdominal pain, epigastric 08/24/2013  . Belching 08/24/2013  . Atypical chest pain 08/24/2013  . Other abnormal blood chemistry 01/21/2012  . Nonspecific (abnormal) findings on radiological and other examination of biliary tract 01/21/2012  . TIA (transient ischemic attack) 11/05/2011  . Syncope and collapse 11/05/2011  . Dehydration 11/05/2011  . UTI (lower urinary tract infection) 11/05/2011  . Generalized weakness 11/05/2011  . Numbness in both legs 11/05/2011  . PVC's (premature ventricular contractions) 10/11/2011  . Nonspecific abnormal unspecified cardiovascular function study 12/20/2010  . HYPERTENSION, BENIGN 01/20/2009  . CHEST PAIN-PRECORDIAL 01/20/2009  . CARCINOMA, BREAST 08/23/2008  . HEMORRHOIDS 08/23/2008  . CONSTIPATION, CHRONIC 08/23/2008  . RECTAL BLEEDING 08/23/2008  . ABDOMINAL PAIN-RLQ 08/23/2008  . PERSONAL HX COLON CANCER 08/23/2008  . PERSONAL HX BREAST CANCER 08/23/2008  . COLON CANCER 01/26/2005    Darrel Hoover PT 11/15/2014, 2:12 PM  [facility] [address] [place], [place], [address] Phone: [phone]   Fax:  [phone]

## 2014-11-16 ENCOUNTER — Ambulatory Visit: Payer: Medicare Other

## 2014-11-16 DIAGNOSIS — M545 Low back pain, unspecified: Secondary | ICD-10-CM

## 2014-11-16 DIAGNOSIS — R293 Abnormal posture: Secondary | ICD-10-CM

## 2014-11-16 DIAGNOSIS — M2560 Stiffness of unspecified joint, not elsewhere classified: Secondary | ICD-10-CM

## 2014-11-16 DIAGNOSIS — R6889 Other general symptoms and signs: Secondary | ICD-10-CM | POA: Diagnosis not present

## 2014-11-18 ENCOUNTER — Telehealth: Payer: Self-pay | Admitting: Oncology

## 2014-11-18 NOTE — Telephone Encounter (Signed)
Pt called to r/s labs/ov, [place], pt confirmed labs/ov .... KJ

## 2014-11-21 NOTE — Therapy (Signed)
[facility] [address] [place], [place], [address] Phone: [phone]   Fax:  [phone]  Physical Therapy Treatment  Patient Details  Name: Eileen [name] MRN: [id number] Date of Birth: 11/12/1945 Referring Provider:  [name], William, MD  Encounter Date: 11/16/2014      PT End of Session - 11/21/14 0744    Visit Number 5   Number of Visits 12   PT Start Time 0130   PT Stop Time 0215   PT Time Calculation (min) 45 min   Activity Tolerance Patient tolerated treatment well   Behavior During Therapy WFL for tasks assessed/performed      Past Medical History  Diagnosis Date  . HEMORRHOIDS   . Hypertension   . Anemia   . Axillary mass 7/99    Left  . Cervical spinal stenosis   . Headache(784.0)   . Blood transfusion without reported diagnosis   . CARCINOMA, BREAST 1995    left  . COLON CANCER 2006    T3 N0 tumor  . Barrett's esophagus     Past Surgical History  Procedure Laterality Date  . Bone marrow transplant  1999  . Total abdominal hysterectomy w/ bilateral salpingoophorectomy  1996  . Hemicolectomy  01/2005    right  . Breast lumpectomy  11/95    left breast  . Inguinal hernia repair    . Ercp  01/21/2012    Procedure: ENDOSCOPIC RETROGRADE CHOLANGIOPANCREATOGRAPHY (ERCP);  Surgeon: Dora M Brodie, MD;  Location: [facility];  Service: Endoscopy;  Laterality: [name];  . Neck mass excision Left 7/99  . Hysteroscopy  08/30/94  . Tunneled venous catheter placement  9/98  . Bunionectomy  3/09, 10/09    right and left  . Rotator cuff repair Right 09/08/13    full tear  . Colon surgery    . Colonoscopy      There were no vitals filed for this visit.  Visit Diagnosis:  Bilateral low back pain without sciatica  Joint stiffness of spine  Abnormal posture      Subjective Assessment - 11/21/14 0747    Subjective No pain 11/16/14   Currently in Pain? No/denies   Multiple Pain Sites No     Instructed  and issued for HEP Pilates exercises for core stability including leg circles , swan, scissors, shoulder bridge, 10-20 reps  1x/day with pain as guide for progression.                             PT Education - 11/21/14 0744    Education provided Yes   Education Details Pilates HEP   Person(s) Educated Patient   Methods Explanation;Demonstration;Tactile cues;Verbal cues;Handout   Comprehension Returned demonstration          PT Short Term Goals - 11/21/14 0747    PT SHORT TERM GOAL #1   Title She will be independent with inital HEP   Status Achieved   PT SHORT TERM GOAL #2   Title she will report pain improve 50% with all activity   Status Achieved   PT SHORT TERM GOAL #3   Title She will report decreased pain  with transitional movements (ex: sit to stand)   Status Achieved           PT Long Term Goals - 11/21/14 0747    PT LONG TERM GOAL #1   Title She will be independent with all hEP issued as of last   visit   Status Achieved   PT LONG TERM GOAL #2   Title Pain will become intermittant and decreased 80% or more  with all activity   Status Achieved               Plan - 11/21/14 0746    Clinical Impression Statement She did stab exerciss well and should do wel at home with same   PT Next Visit Plan She will be out of country [place] and will be discharged today. 11/16/14   Consulted and Agree with Plan of Care Patient        Problem List Patient Active Problem List   Diagnosis Date Noted  . Disorder of rotator cuff 08/31/2013  . Abdominal pain, epigastric 08/24/2013  . Belching 08/24/2013  . Atypical chest pain 08/24/2013  . Other abnormal blood chemistry 01/21/2012  . Nonspecific (abnormal) findings on radiological and other examination of biliary tract 01/21/2012  . TIA (transient ischemic attack) 11/05/2011  . Syncope and collapse 11/05/2011  . Dehydration 11/05/2011  . UTI (lower urinary tract infection) 11/05/2011  .  Generalized weakness 11/05/2011  . Numbness in both legs 11/05/2011  . PVC's (premature ventricular contractions) 10/11/2011  . Nonspecific abnormal unspecified cardiovascular function study 12/20/2010  . HYPERTENSION, BENIGN 01/20/2009  . CHEST PAIN-PRECORDIAL 01/20/2009  . CARCINOMA, BREAST 08/23/2008  . HEMORRHOIDS 08/23/2008  . CONSTIPATION, CHRONIC 08/23/2008  . RECTAL BLEEDING 08/23/2008  . ABDOMINAL PAIN-RLQ 08/23/2008  . PERSONAL HX COLON CANCER 08/23/2008  . PERSONAL HX BREAST CANCER 08/23/2008  . COLON CANCER 01/26/2005    Darrel Hoover PT 11/21/2014, 7:50 AM  [facility] [address] [place], [place], [address] Phone: [phone]   Fax:  [phone]   PHYSICAL THERAPY DISCHARGE SUMMARY  Visits from Start of Care: 5  Current functional level related to goals / functional outcomes: See above   Remaining deficits: No pain   Education / Equipment: Stretching , stability, posture  Plan: Patient agrees to discharge.  Patient goals were met. Patient is being discharged due to meeting the stated rehab goals.  ?????

## 2015-02-08 DIAGNOSIS — I781 Nevus, non-neoplastic: Secondary | ICD-10-CM | POA: Diagnosis not present

## 2015-02-08 DIAGNOSIS — L237 Allergic contact dermatitis due to plants, except food: Secondary | ICD-10-CM | POA: Diagnosis not present

## 2015-02-22 ENCOUNTER — Other Ambulatory Visit (HOSPITAL_BASED_OUTPATIENT_CLINIC_OR_DEPARTMENT_OTHER): Payer: Medicare Other

## 2015-02-22 DIAGNOSIS — C189 Malignant neoplasm of colon, unspecified: Secondary | ICD-10-CM

## 2015-02-22 DIAGNOSIS — Z853 Personal history of malignant neoplasm of breast: Secondary | ICD-10-CM

## 2015-02-22 DIAGNOSIS — C50919 Malignant neoplasm of unspecified site of unspecified female breast: Secondary | ICD-10-CM

## 2015-02-22 LAB — CBC WITH DIFFERENTIAL/PLATELET
BASO%: 1.1 % (ref 0.0–2.0)
Basophils Absolute: 0.1 10*3/uL (ref 0.0–0.1)
EOS%: 1.3 % (ref 0.0–7.0)
Eosinophils Absolute: 0.1 10*3/uL (ref 0.0–0.5)
HCT: 40.7 % (ref 34.8–46.6)
HGB: 13.2 g/dL (ref 11.6–15.9)
LYMPH%: 24.4 % (ref 14.0–49.7)
MCH: 28 pg (ref 25.1–34.0)
MCHC: 32.3 g/dL (ref 31.5–36.0)
MCV: 86.6 fL (ref 79.5–101.0)
MONO#: 0.9 10*3/uL (ref 0.1–0.9)
MONO%: 9.5 % (ref 0.0–14.0)
NEUT#: 6.1 10*3/uL (ref 1.5–6.5)
NEUT%: 63.7 % (ref 38.4–76.8)
Platelets: 225 10*3/uL (ref 145–400)
RBC: 4.7 10*6/uL (ref 3.70–5.45)
RDW: 13.6 % (ref 11.2–14.5)
WBC: 9.6 10*3/uL (ref 3.9–10.3)
lymph#: 2.3 10*3/uL (ref 0.9–3.3)

## 2015-02-22 LAB — COMPREHENSIVE METABOLIC PANEL (CC13)
ALT: 28 U/L (ref 0–55)
AST: 18 U/L (ref 5–34)
Albumin: 3.4 g/dL — ABNORMAL LOW (ref 3.5–5.0)
Alkaline Phosphatase: 68 U/L (ref 40–150)
Anion Gap: 6 mEq/L (ref 3–11)
BUN: 17.1 mg/dL (ref 7.0–26.0)
CO2: 28 mEq/L (ref 22–29)
Calcium: 8.8 mg/dL (ref 8.4–10.4)
Chloride: 107 mEq/L (ref 98–109)
Creatinine: 0.8 mg/dL (ref 0.6–1.1)
EGFR: 78 mL/min/{1.73_m2} — ABNORMAL LOW (ref >90)
Glucose: 96 mg/dl (ref 70–140)
Potassium: 4 mEq/L (ref 3.5–5.1)
Sodium: 141 mEq/L (ref 136–145)
Total Bilirubin: 0.7 mg/dL (ref 0.20–1.20)
Total Protein: 5.8 g/dL — ABNORMAL LOW (ref 6.4–8.3)

## 2015-02-23 LAB — CEA: CEA: 2.1 ng/mL (ref 0.0–5.0)

## 2015-02-23 LAB — CANCER ANTIGEN 27.29: CA 27.29: 27 U/mL (ref 0–39)

## 2015-03-01 ENCOUNTER — Telehealth: Payer: Self-pay | Admitting: Oncology

## 2015-03-01 ENCOUNTER — Ambulatory Visit (HOSPITAL_BASED_OUTPATIENT_CLINIC_OR_DEPARTMENT_OTHER): Payer: Medicare Other | Admitting: Oncology

## 2015-03-01 VITALS — BP 132/82 | HR 73 | Temp 98.0°F | Resp 16 | Wt 146.6 lb

## 2015-03-01 DIAGNOSIS — Z85038 Personal history of other malignant neoplasm of large intestine: Secondary | ICD-10-CM

## 2015-03-01 DIAGNOSIS — C50919 Malignant neoplasm of unspecified site of unspecified female breast: Secondary | ICD-10-CM | POA: Diagnosis present

## 2015-03-01 DIAGNOSIS — C189 Malignant neoplasm of colon, unspecified: Secondary | ICD-10-CM

## 2015-03-01 NOTE — Progress Notes (Signed)
Hematology and Oncology Follow Up Visit  Eileen [name] [id number] Apr 02, 1946 69 y.o. 03/01/2015 3:30 PM Eileen [name], MDHarris, Eileen Saxon, MD   Principle Diagnosis:  69 year old woman with the following diagnoses: 1. Breast cancer diagnosed in 1995 she had a recurrence in 1999 and was treated with stem cell transplant and currently in remission. 2. Colon cancer diagnosed in 2006 status post resection adjuvant therapy and currently in remission. She was found to have stage IIA disease.   Prior Therapy: 1. She is status post lumpectomy and lymph node dissection followed by CMF and radiation therapy. 2. She is status post 4 cycles of Adriamycin and Taxotere followed by stem cell transplantation at [facility] on 08/16/1997 due to recurrence and the left supraclavicular lymph node. Her tumor is ER negative PR positive. 3. She is status post right colectomy for stage IIA adenocarcinoma of the colon in October of 2006. She is status post 10 cycles of FOLFOX concluded in 06/2005  Current therapy: Femara for the last 15 years.  Interim History:  Eileen [name] presents today for a followup visit. Since her last visit, she continues to do very well.  She underwent a colonoscopy this year and was unremarkable. She has not reported any illnesses or hospitalizations. She had not reported any abdominal pain, nausea, vomiting, diarrhea, or change in her bowel habits. She is not reporting any complications from the Femara at this time. She continues to be on calcium supplement without any complications. She does not report any headaches or blurry vision. Does not on any syncope. She does not report any chest pain or shortness of breath. Does not report any cough or hemoptysis. She does not report any nausea, vomiting or change in her bowel habits. She does not report any hematochezia or melena. Does not report any lymphadenopathy or petechiae. Rest of her review of systems unremarkable.  Medications: I have  reviewed the patient's current medications.  Current Outpatient Prescriptions  Medication Sig Dispense Refill  . calcium carbonate (OS-CAL) 600 MG TABS tablet Take 600 mg by mouth daily.     . diazepam (VALIUM) 2 MG tablet Take 1 tablet (2 mg total) by mouth every 8 (eight) hours as needed for muscle spasms. (Patient not taking: Reported on 11/03/2014) 6 tablet 0  . letrozole (FEMARA) 2.5 MG tablet Take 2.5 mg by mouth daily.      Marland Kitchen losartan (COZAAR) 50 MG tablet Take 50 mg by mouth daily.    . Multiple Vitamin (MULTIVITAMIN WITH MINERALS) TABS tablet Take 1 tablet by mouth daily.    . naproxen sodium (ANAPROX) 220 MG tablet Take 440 mg by mouth 2 (two) times daily as needed (pain).    Marland Kitchen oxyCODONE-acetaminophen (PERCOCET/ROXICET) 5-325 MG per tablet Take 1 tablet by mouth every 4 (four) hours as needed for severe pain. 15 tablet 0  . ranitidine (ZANTAC) 150 MG tablet Take 1 tablet (150 mg total) by mouth at bedtime. (Patient not taking: Reported on 11/03/2014) 30 tablet 6  . Vitamin D, Ergocalciferol, (DRISDOL) 50000 UNITS CAPS Take 50,000 Units by mouth every 7 (seven) days. Wednesday.     No current facility-administered medications for this visit.     Allergies:  Allergies  Allergen Reactions  . Levofloxacin Other (See Comments)    Red streaks to IV site when IV dose infusing and arm feeling very irritated, throbbing, painful  . Tape     Can use paper tape, adhesive causes redness    Past Medical History, Surgical history, Social history, and  Family History were reviewed and updated.   Physical Exam: Blood pressure is  132/82, heart rate 73, temperature is 98 with a  respiratory rate of 16.  ECOG: 0 General appearance: alert awake woman appeared in no active distress. Head: Normocephalic, without obvious abnormality Neck: no adenopathy Lymph nodes: Cervical, supraclavicular, and axillary nodes normal. Heart:regular rate and rhythm, S1, S2 normal, no murmur, click, rub or  gallop Lung:chest clear, no wheezing, rales, normal symmetric air entry Abdomin: soft, non-tender, without masses or organomegaly EXT:no erythema, induration, or nodules   Lab Results: Lab Results  Component Value Date   WBC 9.6 02/22/2015   HGB 13.2 02/22/2015   HCT 40.7 02/22/2015   MCV 86.6 02/22/2015   PLT 225 02/22/2015     Chemistry      Component Value Date/Time   NA 141 02/22/2015 1100   NA 130* 09/28/2012 0231   NA 139 03/18/2012 1422   K 4.0 02/22/2015 1100   K 3.2* 09/28/2012 0231   K 4.2 03/18/2012 1422   CL 97 09/28/2012 0231   CL 100 03/18/2012 1422   CO2 28 02/22/2015 1100   CO2 22 09/28/2012 0231   CO2 31 03/18/2012 1422   BUN 17.1 02/22/2015 1100   BUN 17 09/28/2012 0231   BUN 13 03/18/2012 1422   CREATININE 0.8 02/22/2015 1100   CREATININE 0.72 09/28/2012 0231   CREATININE 0.9 03/18/2012 1422      Component Value Date/Time   CALCIUM 8.8 02/22/2015 1100   CALCIUM 8.8 09/28/2012 0231   CALCIUM 9.1 03/18/2012 1422   ALKPHOS 68 02/22/2015 1100   ALKPHOS 83 03/18/2012 1422   ALKPHOS 58 12/02/2011 1020   AST 18 02/22/2015 1100   AST 37 03/18/2012 1422   AST 21 12/02/2011 1020   ALT 28 02/22/2015 1100   ALT 40 03/18/2012 1422   ALT 25 12/02/2011 1020   BILITOT 0.70 02/22/2015 1100   BILITOT 0.70 03/18/2012 1422   BILITOT 0.9 12/02/2011 1020     Results for Eileen [name] (MRN [id number]) as of 03/01/2015 15:26  Ref. Range 03/15/2014 10:02 02/22/2015 11:00  CA 27.29 Latest Ref Range: 0-39 U/mL 39 27  CEA Latest Ref Range: 0.0-5.0 ng/mL 1.7 2.1     Impression and Plan:  69 year old woman with the following issues:  1. Colon cancer diagnosed in 2006 and she is status post surgical resection followed by adjuvant FOLFOX chemotherapy that concluded in December of 2006. Her initial staging at stage IIA.  She is currently on active surveillance without any evidence of recurrence. I have recommended continuing surveillance colonoscopy and she is  up-to-date at this time. No further imaging studies will be needed. Her CEA and laboratory testing does not reveal any abnormalities.  2. Breast cancer: Diagnosed in 1995 was treated with a lumpectomy and radiation followed by adjuvant chemotherapy. She had relapse in 1999 and was treated with high dose chemotherapy and stem cell transplant. She has no evidence of relapse disease and continued to be on Femara at this time. The risks and benefits of continuing this drug were discussed again. Complications include osteoporosis were discussed with potential benefit of protection from breast cancer recurrence might be valid beyond 10 years.   Plan is to continue with the same treatment at this time and continue to monitor her annually.  3. Bone density: To continue to be on calcium supplements and she will need a repeat DEXA scan she continues to be on Femara.   Zola Button, MD 8/3/20163:30 PM

## 2015-03-01 NOTE — Telephone Encounter (Signed)
Gave patient avs report and appointments for August 2017.  °

## 2015-03-15 ENCOUNTER — Other Ambulatory Visit: Payer: Medicare Other

## 2015-03-22 ENCOUNTER — Ambulatory Visit: Payer: Medicare Other | Admitting: Oncology

## 2015-05-18 ENCOUNTER — Encounter: Payer: Self-pay | Admitting: Obstetrics & Gynecology

## 2015-05-18 ENCOUNTER — Ambulatory Visit (INDEPENDENT_AMBULATORY_CARE_PROVIDER_SITE_OTHER): Payer: Medicare Other | Admitting: Obstetrics & Gynecology

## 2015-05-18 VITALS — BP 150/92 | HR 68 | Resp 16 | Ht 64.5 in | Wt 148.0 lb

## 2015-05-18 DIAGNOSIS — Z01419 Encounter for gynecological examination (general) (routine) without abnormal findings: Secondary | ICD-10-CM | POA: Diagnosis not present

## 2015-05-18 DIAGNOSIS — C50919 Malignant neoplasm of unspecified site of unspecified female breast: Secondary | ICD-10-CM | POA: Insufficient documentation

## 2015-05-18 DIAGNOSIS — Z124 Encounter for screening for malignant neoplasm of cervix: Secondary | ICD-10-CM | POA: Diagnosis not present

## 2015-05-18 NOTE — Progress Notes (Signed)
69 y.o. G3P2 MarriedCaucasianF here for annual exam.  Doing well.  Just got back from [place] after two months with her second grandchild.  Son and daughter in law have an almost 37 year old girl and a 3 old girl.    Denies vaginal bleeding.    Saw Dr. Alen Blew, oncologist, in early August.    Had upper GI and colonoscopy with Dr. Olevia Perches 3/16.  Was recommended follow up five but pt feel a lot more comfortable with three years, as it has been in the past.    PCP:  Dr. Kenton Kingfisher.  Has appt next month.  Will get her flu shot then.  Reports Vit D was 29.  On weekly Vit D.  Will have this rechecked at her appt next month.    Patient's last menstrual period was 07/30/1995.          Sexually active: No.  The current method of family planning is status post hysterectomy.    Exercising: Yes.     Smoker:  Former smoker-quit in [place] Maintenance: Pap:  01/26/13 WNL History of abnormal Pap:  no MMG:  08/15/14 3D BiRads 1-negative Colonoscopy:  4/16-Dr Brodie-repeat in 5 years BMD:   2012, -2.4/-1.8.  Declines BMD this year. TDaP:  01/02/12 Screening Labs: PCP, Hb today: PCP, Urine today: PCP   reports that she quit smoking about 24 years ago. She has never used smokeless tobacco. She reports that she drinks about 1.2 oz of alcohol per week. She reports that she does not use illicit drugs.  Past Medical History  Diagnosis Date  . HEMORRHOIDS   . Hypertension   . Anemia   . Axillary mass 7/99    Left  . Cervical spinal stenosis   . Headache(784.0)   . Blood transfusion without reported diagnosis   . CARCINOMA, BREAST 1995    left  . COLON CANCER 2006    T3 N0 tumor  . Barrett's esophagus     Past Surgical History  Procedure Laterality Date  . Bone marrow transplant  1999  . Total abdominal hysterectomy w/ bilateral salpingoophorectomy  1996  . Hemicolectomy  01/2005    right  . Breast lumpectomy  11/95    left breast  . Inguinal hernia repair    . Ercp  01/21/2012     Procedure: ENDOSCOPIC RETROGRADE CHOLANGIOPANCREATOGRAPHY (ERCP);  Surgeon: Lafayette Dragon, MD;  Location: [facility];  Service: Endoscopy;  Laterality: [name];  . Neck mass excision Left 7/99  . Hysteroscopy  08/30/94  . Tunneled venous catheter placement  9/98  . Bunionectomy  3/09, 10/09    right and left  . Rotator cuff repair Right 09/08/13    full tear  . Colon surgery    . Colonoscopy      Current Outpatient Prescriptions  Medication Sig Dispense Refill  . calcium carbonate (OS-CAL) 600 MG TABS tablet Take 600 mg by mouth daily.     Marland Kitchen letrozole (FEMARA) 2.5 MG tablet Take 2.5 mg by mouth daily.      Marland Kitchen losartan (COZAAR) 50 MG tablet Take 50 mg by mouth daily.    . Multiple Vitamin (MULTIVITAMIN WITH MINERALS) TABS tablet Take 1 tablet by mouth daily.    . ranitidine (ZANTAC) 150 MG tablet Take 1 tablet (150 mg total) by mouth at bedtime. 30 tablet 6  . Vitamin D, Ergocalciferol, (DRISDOL) 50000 UNITS CAPS Take 50,000 Units by mouth every 7 (seven) days. Wednesday.     No current  facility-administered medications for this visit.    Family History  Problem Relation Age of Onset  . Uterine cancer Mother   . Heart attack Father   . Colon cancer Neg Hx   . Esophageal cancer Neg Hx   . Stomach cancer Neg Hx   . Rectal cancer Neg Hx     ROS:  Pertinent items are noted in HPI.  Otherwise, a comprehensive ROS was negative.  Exam:   Pulse 68  Resp 16  Ht 5' 4.5" (1.638 m)  Wt 148 lb (67.132 kg)  BMI 25.02 kg/m2  LMP 07/30/1995  Weight change:   Height: 5' 4.5" (163.8 cm)  Ht Readings from Last 3 Encounters:  05/18/15 5' 4.5" (1.638 m)  10/11/14 5\' 5"  (1.651 m)  09/27/14 5\' 5"  (1.651 m)    General appearance: alert, cooperative and appears stated age Head: Normocephalic, without obvious abnormality, atraumatic Neck: no adenopathy, supple, symmetrical, trachea midline and thyroid normal to inspection and palpation Lungs: clear to auscultation bilaterally Breasts: normal  appearance, no masses or tenderness, left lumpectomy and left axillary scar well healed.  No nodularity. Heart: regular rate and rhythm Abdomen: soft, non-tender; bowel sounds normal; no masses,  no organomegaly Extremities: extremities normal, atraumatic, no cyanosis or edema Skin: Skin color, texture, turgor normal. No rashes or lesions Lymph nodes: Cervical, supraclavicular, and axillary nodes normal. No abnormal inguinal nodes palpated Neurologic: Grossly normal   Pelvic: External genitalia:  no lesions              Urethra:  normal appearing urethra with no masses, tenderness or lesions              Bartholins and Skenes: normal                 Vagina: normal appearing vagina with normal color and discharge, no lesions, atrophic changes              Cervix: absent              Pap taken: Yes.   Bimanual Exam:  Uterus:  uterus absent              Adnexa: normal adnexa and no mass, fullness, tenderness               Rectovaginal: Confirms               Anus:  normal sphincter tone, no lesions  Chaperone was present for exam.  A:  Well Woman with normal exam  H/O TAH/BSO 1996 No HRT  H/O colon cancer s/p hemicolectomy 7/06. Colonoscopy every three years. (most recent colonoscopy 2016, Dr. Olevia Perches told pt she could go five years.  Pt uncomfortable with this.) H/O breast cancer with recurrence 1995/1998 with clavicular mass s/p resection, bone marrow transplant Vit D deficiency. Followed by PCP.   P: Mammogram yearly.  pap smear every other year. Neg with Neg HR HPV 2014. Pap today.  Pt does not desire stopping pap smears. Continues on Femara and followed yearly by oncology. Labs and vaccines with Dr. Kenton Kingfisher.  Has appt next week. return annually or prn

## 2015-05-19 LAB — IPS PAP SMEAR ONLY

## 2015-05-25 ENCOUNTER — Telehealth: Payer: Self-pay

## 2015-05-25 DIAGNOSIS — L570 Actinic keratosis: Secondary | ICD-10-CM | POA: Diagnosis not present

## 2015-05-25 DIAGNOSIS — L821 Other seborrheic keratosis: Secondary | ICD-10-CM | POA: Diagnosis not present

## 2015-05-25 DIAGNOSIS — L859 Epidermal thickening, unspecified: Secondary | ICD-10-CM | POA: Diagnosis not present

## 2015-05-25 DIAGNOSIS — D2261 Melanocytic nevi of right upper limb, including shoulder: Secondary | ICD-10-CM | POA: Diagnosis not present

## 2015-05-25 NOTE — Telephone Encounter (Signed)
lmtcb/kn 

## 2015-05-25 NOTE — Telephone Encounter (Signed)
-----   Message from Megan Salon, MD sent at 05/20/2015  7:10 AM EDT ----- Please inform pt that Pap had small cellularity.  She needs repeat Pap.  Nothing was abnormal.  Thanks.

## 2015-05-26 NOTE — Telephone Encounter (Signed)
Returning call.

## 2015-05-26 NOTE — Telephone Encounter (Signed)
Returned call

## 2015-05-26 NOTE — Telephone Encounter (Signed)
Patient returned call and said, "Please tell Claiborne Billings it is okay to leave a detailed message when she calls back." Patient is going to the gym and will not be available.

## 2015-05-29 ENCOUNTER — Ambulatory Visit (INDEPENDENT_AMBULATORY_CARE_PROVIDER_SITE_OTHER): Payer: Medicare Other | Admitting: Obstetrics & Gynecology

## 2015-05-29 ENCOUNTER — Encounter: Payer: Self-pay | Admitting: Obstetrics & Gynecology

## 2015-05-29 VITALS — BP 148/86 | HR 80 | Resp 16 | Wt 148.0 lb

## 2015-05-29 DIAGNOSIS — R87615 Unsatisfactory cytologic smear of cervix: Secondary | ICD-10-CM

## 2015-05-29 DIAGNOSIS — Z124 Encounter for screening for malignant neoplasm of cervix: Secondary | ICD-10-CM | POA: Diagnosis not present

## 2015-05-29 NOTE — Telephone Encounter (Signed)
Patient aware-appointment scheduled for 05/29/15//kn

## 2015-05-29 NOTE — Progress Notes (Signed)
69 y.o. G3P2 Married CaucasianF here for repeat Pap smear due to insufficiency cells noted on pap obtained at [facility] on 05/18/15.  Pap smear findings reviewed with pt.  She understands pap was not "abnormal" just that there weren't enough cells for analysis.    Denies vaginal bleeding or discharge.     H/O TAH/BSO 1996.  Pt and I have discussed screening recommendations and she continues to desire having Pap smear.    EXAM: BP 148/86 mmHg  Pulse 80  Resp 16  Wt 148 lb (67.132 kg)  LMP 07/30/1995 General appearance:  WNWD Female, NAD Pelvic exam:  VULVA: normal appearing vulva with no masses, tenderness or lesions VAGINA: normal appearing vagina with normal color and discharge, no lesions, atrophic,  CERVIX: normal appearing cervix without discharge or lesions, surgically absent UTERUS: uterus is normal size, shape, consistency and nontender, surgically absent, vaginal cuff well healed. Pap smear obtained.  Assessment: History of insufficient squamous cells on Pap  Plan: Pap obtained.  Follow up recommendations will be made pending results.

## 2015-05-30 DIAGNOSIS — Z124 Encounter for screening for malignant neoplasm of cervix: Secondary | ICD-10-CM | POA: Diagnosis not present

## 2015-05-30 NOTE — Addendum Note (Signed)
Addended by: Megan Salon on: 05/30/2015 05:09 PM   Modules accepted: Orders

## 2015-05-31 DIAGNOSIS — E785 Hyperlipidemia, unspecified: Secondary | ICD-10-CM | POA: Diagnosis not present

## 2015-05-31 DIAGNOSIS — E559 Vitamin D deficiency, unspecified: Secondary | ICD-10-CM | POA: Diagnosis not present

## 2015-05-31 DIAGNOSIS — M859 Disorder of bone density and structure, unspecified: Secondary | ICD-10-CM | POA: Diagnosis not present

## 2015-05-31 DIAGNOSIS — Z23 Encounter for immunization: Secondary | ICD-10-CM | POA: Diagnosis not present

## 2015-05-31 DIAGNOSIS — I10 Essential (primary) hypertension: Secondary | ICD-10-CM | POA: Diagnosis not present

## 2015-06-05 LAB — IPS PAP SMEAR ONLY

## 2015-07-11 DIAGNOSIS — M67911 Unspecified disorder of synovium and tendon, right shoulder: Secondary | ICD-10-CM | POA: Diagnosis not present

## 2015-07-11 DIAGNOSIS — M25511 Pain in right shoulder: Secondary | ICD-10-CM | POA: Diagnosis not present

## 2015-07-13 ENCOUNTER — Other Ambulatory Visit: Payer: Self-pay

## 2015-07-13 DIAGNOSIS — Z1231 Encounter for screening mammogram for malignant neoplasm of breast: Secondary | ICD-10-CM

## 2015-07-26 DIAGNOSIS — M75121 Complete rotator cuff tear or rupture of right shoulder, not specified as traumatic: Secondary | ICD-10-CM | POA: Diagnosis not present

## 2015-08-02 DIAGNOSIS — Z881 Allergy status to other antibiotic agents status: Secondary | ICD-10-CM | POA: Diagnosis not present

## 2015-08-02 DIAGNOSIS — M7501 Adhesive capsulitis of right shoulder: Secondary | ICD-10-CM | POA: Diagnosis not present

## 2015-08-02 DIAGNOSIS — Z9889 Other specified postprocedural states: Secondary | ICD-10-CM | POA: Diagnosis not present

## 2015-08-02 DIAGNOSIS — Z9481 Bone marrow transplant status: Secondary | ICD-10-CM | POA: Diagnosis not present

## 2015-08-02 DIAGNOSIS — Z91048 Other nonmedicinal substance allergy status: Secondary | ICD-10-CM | POA: Diagnosis not present

## 2015-08-02 DIAGNOSIS — Z87891 Personal history of nicotine dependence: Secondary | ICD-10-CM | POA: Diagnosis not present

## 2015-08-02 DIAGNOSIS — Z79811 Long term (current) use of aromatase inhibitors: Secondary | ICD-10-CM | POA: Diagnosis not present

## 2015-08-02 DIAGNOSIS — C50919 Malignant neoplasm of unspecified site of unspecified female breast: Secondary | ICD-10-CM | POA: Diagnosis not present

## 2015-08-02 DIAGNOSIS — M75121 Complete rotator cuff tear or rupture of right shoulder, not specified as traumatic: Secondary | ICD-10-CM | POA: Diagnosis not present

## 2015-08-02 DIAGNOSIS — G2581 Restless legs syndrome: Secondary | ICD-10-CM | POA: Diagnosis not present

## 2015-08-02 DIAGNOSIS — M19011 Primary osteoarthritis, right shoulder: Secondary | ICD-10-CM | POA: Diagnosis not present

## 2015-08-02 DIAGNOSIS — I10 Essential (primary) hypertension: Secondary | ICD-10-CM | POA: Diagnosis not present

## 2015-08-02 DIAGNOSIS — M7541 Impingement syndrome of right shoulder: Secondary | ICD-10-CM | POA: Diagnosis not present

## 2015-08-02 DIAGNOSIS — K219 Gastro-esophageal reflux disease without esophagitis: Secondary | ICD-10-CM | POA: Diagnosis not present

## 2015-08-02 DIAGNOSIS — Z85038 Personal history of other malignant neoplasm of large intestine: Secondary | ICD-10-CM | POA: Diagnosis not present

## 2015-08-02 DIAGNOSIS — Z79899 Other long term (current) drug therapy: Secondary | ICD-10-CM | POA: Diagnosis not present

## 2015-08-08 ENCOUNTER — Ambulatory Visit: Payer: Medicare Other | Attending: Orthopaedic Surgery | Admitting: Physical Therapy

## 2015-08-08 DIAGNOSIS — M545 Low back pain: Secondary | ICD-10-CM | POA: Insufficient documentation

## 2015-08-08 DIAGNOSIS — R29898 Other symptoms and signs involving the musculoskeletal system: Secondary | ICD-10-CM

## 2015-08-08 DIAGNOSIS — R293 Abnormal posture: Secondary | ICD-10-CM | POA: Diagnosis not present

## 2015-08-08 DIAGNOSIS — R6889 Other general symptoms and signs: Secondary | ICD-10-CM | POA: Insufficient documentation

## 2015-08-08 DIAGNOSIS — M25511 Pain in right shoulder: Secondary | ICD-10-CM

## 2015-08-08 DIAGNOSIS — M25611 Stiffness of right shoulder, not elsewhere classified: Secondary | ICD-10-CM

## 2015-08-08 NOTE — Patient Instructions (Signed)
   Bastian Andreoli PT, DPT, LAT, ATC  [facility] Phone: [phone]     

## 2015-08-08 NOTE — Therapy (Signed)
[place], [place], [address] Phone: [phone]   Fax:  [phone]  Physical Therapy Evaluation  Patient Details  Name: Eileen [name] MRN: [id number] Date of Birth: 1945-10-12 Referring Provider: Ermalene Postin MD  Encounter Date: 08/08/2015      PT End of Session - 08/08/15 1150    Visit Number 1   Number of Visits 12   Date for PT Re-Evaluation 10/03/15   Authorization Type Medicare kx modifier 15th visit, progress note by the 9th visit.    PT Start Time 1100   PT Stop Time 1145   PT Time Calculation (min) 45 min   Activity Tolerance Patient tolerated treatment well   Behavior During Therapy WFL for tasks assessed/performed      Past Medical History  Diagnosis Date  . HEMORRHOIDS   . Hypertension   . Anemia   . Axillary mass 7/99    Left  . Cervical spinal stenosis   . Headache(784.0)   . Blood transfusion without reported diagnosis   . CARCINOMA, BREAST 1995    left  . COLON CANCER 2006    T3 N0 tumor  . Barrett's esophagus     Past Surgical History  Procedure Laterality Date  . Bone marrow transplant  1999  . Total abdominal hysterectomy w/ bilateral salpingoophorectomy  1996  . Hemicolectomy  01/2005    right  . Breast lumpectomy  11/95    left breast  . Inguinal hernia repair    . Ercp  01/21/2012    Procedure: ENDOSCOPIC RETROGRADE CHOLANGIOPANCREATOGRAPHY (ERCP);  Surgeon: Lafayette Dragon, MD;  Location: [facility];  Service: Endoscopy;  Laterality: [name];  . Neck mass excision Left 7/99  . Hysteroscopy  08/30/94  . Tunneled venous catheter placement  9/98  . Bunionectomy  3/09, 10/09    right and left  . Rotator cuff repair Right 09/08/13    full tear  . Colon surgery    . Colonoscopy      There were no vitals filed for this visit.  Visit Diagnosis:  Right shoulder pain - Plan: PT plan of care cert/re-cert  Weakness of right arm - Plan: PT plan of care  cert/re-cert  Decreased right shoulder range of motion - Plan: PT plan of care cert/re-cert  Abnormal posture - Plan: PT plan of care cert/re-cert      Subjective Assessment - 08/08/15 1116    Subjective pt is a 70 y.o F s/p R large RC repair on 08/02/2015. since the surgery she reports things are going pretty well. She reports that she hasn't had any pain in the shoulder and hasn't had to taken anything for pain. she is still wearing a silng with bolster.    Limitations Lifting;Writing;House hold activities   How long can you sit comfortably? unlimited   How long can you stand comfortably? unlimited   How long can you walk comfortably? unlimited   Diagnostic tests per pt report November/December at [place] tear   Patient Stated Goals to get back to normal, to get back to house work and exercising   Currently in Pain? Yes   Pain Score 0-No pain   Pain Location Shoulder   Pain Orientation Right   Pain Frequency Rarely   Aggravating Factors  [name]   Pain Relieving Factors [name]            [facility] PT Assessment - 08/08/15 1103    Assessment   Medical Diagnosis s/p R  Large R/C repair   Referring Provider Ermalene Postin MD   Onset Date/Surgical Date 08/02/15   Hand Dominance Right   Next MD Visit 08/10/2015   Prior Therapy yes   Precautions   Precautions Shoulder   Required Braces or Orthoses Other Brace/Splint   Other Brace/Splint sling, with bolster   Restrictions   Weight Bearing Restrictions Yes   Other Position/Activity Restrictions RUE   Balance Screen   Has the patient fallen in the past 6 months No   Has the patient had a decrease in activity level because of a fear of falling?  No   Is the patient reluctant to leave their home because of a fear of falling?  No   Home Environment   Living Environment Private residence   Living Arrangements Spouse/significant other   Available Help at Discharge Available PRN/intermittently;Available 24 hours/day   Type of Home  House   Home Access Stairs to enter   Entrance Stairs-Number of Steps 3   Entrance Stairs-Rails None   Home Layout One level   Home Equipment --  sling brace   Prior Function   Level of Independence Independent with basic ADLs;Independent   Vocation Retired   Leisure Facilities manager play,    Cognition   Overall Cognitive Status Within Functional Limits for tasks assessed   Observation/Other Assessments   Focus on Therapeutic Outcomes (FOTO)  88% limited  predicted 39%   Posture/Postural Control   Posture/Postural Control Postural limitations   Postural Limitations Rounded Shoulders;Forward head   ROM / Strength   AROM / PROM / Strength AROM;PROM;Strength   AROM   AROM Assessment Site Shoulder   Right/Left Shoulder Right;Left   Left Shoulder Flexion 140 Degrees   Left Shoulder ABduction 110 Degrees   Left Shoulder Internal Rotation 66 Degrees   Left Shoulder External Rotation 63 Degrees   PROM   PROM Assessment Site Shoulder   Right/Left Shoulder Right;Left   Right Shoulder Flexion 68 Degrees   Right Shoulder ABduction 80 Degrees   Right Shoulder Internal Rotation 45 Degrees   Right Shoulder External Rotation 10 Degrees   Strength   Overall Strength Comments RUE no assessed due    Strength Assessment Site Shoulder   Right/Left Shoulder Right;Left   Left Shoulder Flexion 4+/5   Left Shoulder Extension 4+/5   Left Shoulder ABduction 4+/5   Left Shoulder Internal Rotation 4+/5   Left Shoulder External Rotation 4+/5                           PT Education - 08/08/15 1150    Education provided Yes   Education Details evaluation findings, POC, goals, HEP   Person(s) Educated Patient   Methods Explanation   Comprehension Verbalized understanding          PT Short Term Goals - 08/08/15 1159    PT SHORT TERM GOAL #1   Title pt will be I with initial HEP (09/08/2015)   Time 4   Period Weeks   Status New   PT SHORT TERM GOAL #2   Title pt will improve  her R shoulder AROM by >/=10 degrees of flexion/abduction to assist with functional progression (09/08/2015)   Time 4   Period Weeks   Status New   PT SHORT TERM GOAL #3   Title pt will be able to verbalize and demonstrate techniques to control and reduce R shoulder inflammation and edema via RICE and HEP (09/08/2015)  Time 4   Period Weeks   Status New   PT SHORT TERM GOAL #4   Title pt will improve her FOTO score by >/= 10 points to demonstrate improving function (09/08/2015)   Time 4   Period Weeks   Status New           PT Long Term Goals - 08-16-2015 1204    PT LONG TERM GOAL #1   Title pt will be I with all HEP given as of last visit (10/03/2015)   Time 8   Period Weeks   Status New   PT LONG TERM GOAL #2   Title pt will increase R shouldre flexion/ abduction to >/= 110 degrees with </=2/10 pain to assist with ADLs (10/03/2015)   Time 8   Period Weeks   Status New   PT LONG TERM GOAL #3   Title pt will demonstrate >/= 4-/5 strength in the r should to assist with lifting and carrying activities with </=2/10 pain (10/03/2015)   Time 8   Period Weeks   Status New   PT LONG TERM GOAL #4   Title pt will be able to lift and carry >/= 5# at shoulder height or higher with </= 2/10 pain to assist with ADLS ([id number])   Time 8   Period Weeks   Status New   PT LONG TERM GOAL #5   Title pt will improve her FOTO score to >/= 60 to demonstrate improved function ( 10/03/2015)   Time 8   Period Weeks   Status New               Plan - 08-16-15 1152    Clinical Impression Statement Eileen [name] present to OPPT s/p R large R/C repair on 08/02/2015. the incision site appears to be intact clean and healing well. she demonstrates limited PROM secondary to stiffness and pain at enranges. strength was not assessed  in the R shoulder due to protocol restrictions. she demonstraction abnormal posture with forward head and anteriorly rolled shoulders. she currently is wearing a sling with bolster  and requires assistance with donning/ doffing from her husband. She would benefit from physical therapy to maximize her function my addressing the impairments listed.    Pt will benefit from skilled therapeutic intervention in order to improve on the following deficits Decreased activity tolerance;Decreased endurance;Impaired UE functional use;Decreased strength;Hypomobility;Impaired flexibility;Increased edema;Improper body mechanics;Postural dysfunction;Decreased range of motion   Rehab Potential Good   PT Frequency 2x / week   PT Duration 8 weeks   PT Treatment/Interventions ADLs/Self Care Home Management;Electrical Stimulation;Iontophoresis 4mg /ml Dexamethasone;Moist Heat;Therapeutic exercise;Therapeutic activities;Manual techniques;Vasopneumatic Device;Dry needling;Patient/family education;Cryotherapy;Passive range of motion;Ultrasound   PT Next Visit Plan assess response to HEP, shoulder PROM, Mobs, Modalities PRN   PT Home Exercise Plan shoulder wand flexion/ abduction, pendulums, shoulder rectractions   Consulted and Agree with Plan of Care Patient          G-Codes - 2015-08-16 1210    Functional Assessment Tool Used FOTO 88% limited   Functional Limitation Carrying, moving and handling objects   Carrying, Moving and Handling Objects Current Status [id number]) At least 80 percent but less than 100 percent impaired, limited or restricted   Carrying, Moving and Handling Objects Goal Status [id number]) At least 20 percent but less than 40 percent impaired, limited or restricted       Problem List Patient Active Problem List   Diagnosis Date Noted  . Complete rotator cuff rupture of left shoulder 09/21/2014  . Abdominal  pain, epigastric 08/24/2013  . Belching 08/24/2013  . Other abnormal blood chemistry 01/21/2012  . Nonspecific (abnormal) findings on radiological and other examination of biliary tract 01/21/2012  . TIA (transient ischemic attack) 11/05/2011  . PVC's (premature ventricular  contractions) 10/11/2011  . Nonspecific abnormal unspecified cardiovascular function study 12/20/2010  . HYPERTENSION, BENIGN 01/20/2009  . Malignant neoplasm of female breast ([place]) 08/23/2008  . HEMORRHOIDS 08/23/2008  . CONSTIPATION, CHRONIC 08/23/2008  . RECTAL BLEEDING 08/23/2008  . Malignant neoplasm of colon ([place]) 01/26/2005   Starr Lake PT, DPT, LAT, ATC  08/08/2015  12:14 PM     [facility] [address] [place], [place], [address] Phone: [phone]   Fax:  [phone]  Name: Eileen [name] MRN: [id number] Date of Birth: 1946-06-15

## 2015-08-15 ENCOUNTER — Ambulatory Visit: Payer: Medicare Other | Admitting: Physical Therapy

## 2015-08-15 DIAGNOSIS — M25511 Pain in right shoulder: Secondary | ICD-10-CM | POA: Diagnosis not present

## 2015-08-15 DIAGNOSIS — R293 Abnormal posture: Secondary | ICD-10-CM | POA: Diagnosis not present

## 2015-08-15 DIAGNOSIS — R6889 Other general symptoms and signs: Secondary | ICD-10-CM | POA: Diagnosis not present

## 2015-08-15 DIAGNOSIS — M545 Low back pain: Secondary | ICD-10-CM | POA: Diagnosis not present

## 2015-08-15 DIAGNOSIS — R29898 Other symptoms and signs involving the musculoskeletal system: Secondary | ICD-10-CM | POA: Diagnosis not present

## 2015-08-15 DIAGNOSIS — M25611 Stiffness of right shoulder, not elsewhere classified: Secondary | ICD-10-CM

## 2015-08-15 NOTE — Therapy (Signed)
[place], [place], [address] Phone: [phone]   Fax:  [phone]  Physical Therapy Treatment  Patient Details  Name: Eileen [name] MRN: [id number] Date of Birth: 07-17-1946 Referring Provider: Ermalene Postin MD  Encounter Date: 08/15/2015      PT End of Session - 08/15/15 1516    Visit Number 2   Number of Visits 12   Date for PT Re-Evaluation 10/03/15   PT Start Time 1105   PT Stop Time 1145   PT Time Calculation (min) 40 min   Activity Tolerance Patient tolerated treatment well;No increased pain   Behavior During Therapy [facility] for tasks assessed/performed      Past Medical History  Diagnosis Date  . HEMORRHOIDS   . Hypertension   . Anemia   . Axillary mass 7/99    Left  . Cervical spinal stenosis   . Headache(784.0)   . Blood transfusion without reported diagnosis   . CARCINOMA, BREAST 1995    left  . COLON CANCER 2006    T3 N0 tumor  . Barrett's esophagus     Past Surgical History  Procedure Laterality Date  . Bone marrow transplant  1999  . Total abdominal hysterectomy w/ bilateral salpingoophorectomy  1996  . Hemicolectomy  01/2005    right  . Breast lumpectomy  11/95    left breast  . Inguinal hernia repair    . Ercp  01/21/2012    Procedure: ENDOSCOPIC RETROGRADE CHOLANGIOPANCREATOGRAPHY (ERCP);  Surgeon: Lafayette Dragon, MD;  Location: [facility];  Service: Endoscopy;  Laterality: [name];  . Neck mass excision Left 7/99  . Hysteroscopy  08/30/94  . Tunneled venous catheter placement  9/98  . Bunionectomy  3/09, 10/09    right and left  . Rotator cuff repair Right 09/08/13    full tear  . Colon surgery    . Colonoscopy      There were no vitals filed for this visit.  Visit Diagnosis:  Decreased right shoulder range of motion  Abnormal posture      Subjective Assessment - 08/15/15 1110    Subjective No pain.  Patient has protocol for [place] and surgical report.   Able to  sleep flatter.  1 pillow and small elevation.  Sleeps in brace.   Currently in Pain? No/denies   Pain Frequency Rarely   Pain Relieving Factors medication            [facility] PT Assessment - 08/15/15 0001    PROM   Right Shoulder Flexion 78 Degrees   Right Shoulder External Rotation 30 Degrees                     [facility] Adult PT Treatment/Exercise - 08/15/15 0001    Shoulder Exercises: ROM/Strengthening   Other ROM/Strengthening Exercises table slide to 68 degrees 90 allowed.  10 reps, instruction, precautions reviewed so patient and husband were informed.     Other ROM/Strengthening Exercises shoulder circles 10 X   Manual Therapy   Manual Therapy --  passive elbow flexion/extension, supination/pronation 10 x    Manual therapy comments PROM flexion several sets sitting and supine.  ER several sets sitting and supine.  Taught husband how to do and he did well.  scar tissue work lateral scar, softened.  It is hard for patient to relax.                  PT Education - 08/15/15 1515  Education provided Yes   Education Details PROM, precautions, table slides.   Person(s) Educated Patient;Spouse   Methods Explanation;Demonstration;Tactile cues;Verbal cues;Handout   Comprehension Verbalized understanding;Returned demonstration          PT Short Term Goals - 08/15/15 1519    PT SHORT TERM GOAL #1   Title pt will be I with initial HEP (09/08/2015)   Baseline cues   Time 4   Period Weeks   Status On-going   PT SHORT TERM GOAL #2   Title pt will improve her R shoulder AROM by >/=10 degrees of flexion/abduction to assist with functional progression (09/08/2015)   Time 4   Period Weeks   Status On-going   PT SHORT TERM GOAL #3   Title pt will be able to verbalize and demonstrate techniques to control and reduce R shoulder inflammation and edema via RICE and HEP (09/08/2015)   Time 4   Period Weeks   Status Unable to assess   PT SHORT TERM GOAL #4   Title pt  will improve her FOTO score by >/= 10 points to demonstrate improving function (09/08/2015)   Time 4   Period Weeks   Status Unable to assess           PT Long Term Goals - 08/08/15 1204    PT LONG TERM GOAL #1   Title pt will be I with all HEP given as of last visit (10/03/2015)   Time 8   Period Weeks   Status New   PT LONG TERM GOAL #2   Title pt will increase R shouldre flexion/ abduction to >/= 110 degrees with </=2/10 pain to assist with ADLs (10/03/2015)   Time 8   Period Weeks   Status New   PT LONG TERM GOAL #3   Title pt will demonstrate >/= 4-/5 strength in the r should to assist with lifting and carrying activities with </=2/10 pain (10/03/2015)   Time 8   Period Weeks   Status New   PT LONG TERM GOAL #4   Title pt will be able to lift and carry >/= 5# at shoulder height or higher with </= 2/10 pain to assist with ADLS ([id number])   Time 8   Period Weeks   Status New   PT LONG TERM GOAL #5   Title pt will improve her FOTO score to >/= 60 to demonstrate improved function ( 10/03/2015)   Time 8   Period Weeks   Status New               Plan - 08/15/15 1516    Clinical Impression Statement Protocol here from [place].  progress toward home exercise goal.  Patient tense and needed longer time to relax and trust me to move her arm.  ROM improving passively    PT Next Visit Plan Follow [facility] protocol.   PT Home Exercise Plan table slide to 90.  PROM flexion to 90,  ER to 45 PROM ,    Consulted and Agree with Plan of Care Patient;Family member/caregiver   Family Member Consulted husband        Problem List Patient Active Problem List   Diagnosis Date Noted  . Complete rotator cuff rupture of left shoulder 09/21/2014  . Abdominal pain, epigastric 08/24/2013  . Belching 08/24/2013  . Other abnormal blood chemistry 01/21/2012  . Nonspecific (abnormal) findings on radiological and other examination of biliary tract 01/21/2012  . TIA (transient ischemic attack)  11/05/2011  . PVC's (premature  ventricular contractions) 10/11/2011  . Nonspecific abnormal unspecified cardiovascular function study 12/20/2010  . HYPERTENSION, BENIGN 01/20/2009  . Malignant neoplasm of female breast ([place]) 08/23/2008  . HEMORRHOIDS 08/23/2008  . CONSTIPATION, CHRONIC 08/23/2008  . RECTAL BLEEDING 08/23/2008  . Malignant neoplasm of colon ([place]) 01/26/2005    Rasheen Schewe 08/15/2015, 3:20 PM  [facility] [address] [place], [place], [address] Phone: [phone]   Fax:  [phone]  Name: Eileen [name] MRN: [id number] Date of Birth: 1945-09-28    Melvenia Needles, PTA 08/15/2015 3:20 PM Phone: [phone] Fax: [phone]

## 2015-08-15 NOTE — Patient Instructions (Addendum)
Flexion (Passive)    Sitting upright, slide forearm forward along table, bending from the waist until a stretch is felt. Hold 5____ seconds. Repeat __10__ times. Do __2__ sessions per day.  Copyright  VHI. All rights reserved.    ER .  Passive External rotation by her husband to 45 degrees, Hold cane exercises for now.

## 2015-08-17 ENCOUNTER — Ambulatory Visit: Payer: Medicare Other | Admitting: Physical Therapy

## 2015-08-17 DIAGNOSIS — R6889 Other general symptoms and signs: Secondary | ICD-10-CM

## 2015-08-17 DIAGNOSIS — R29898 Other symptoms and signs involving the musculoskeletal system: Secondary | ICD-10-CM | POA: Diagnosis not present

## 2015-08-17 DIAGNOSIS — M545 Low back pain, unspecified: Secondary | ICD-10-CM

## 2015-08-17 DIAGNOSIS — R293 Abnormal posture: Secondary | ICD-10-CM

## 2015-08-17 DIAGNOSIS — M25611 Stiffness of right shoulder, not elsewhere classified: Secondary | ICD-10-CM

## 2015-08-17 DIAGNOSIS — M25511 Pain in right shoulder: Secondary | ICD-10-CM | POA: Diagnosis not present

## 2015-08-17 NOTE — Therapy (Signed)
[place], [place], [address] Phone: [phone]   Fax:  [phone]  Physical Therapy Treatment  Patient Details  Name: Eileen [name] MRN: [id number] Date of Birth: 12-12-1945 Referring Provider: Ermalene Postin MD  Encounter Date: 08/17/2015      PT End of Session - 08/17/15 1233    Visit Number 3   Number of Visits 12   Date for PT Re-Evaluation 10/03/15   Authorization Type Medicare kx modifier 15th visit, progress note by the 9th visit.    PT Start Time 1145   PT Stop Time 1230   PT Time Calculation (min) 45 min   Activity Tolerance Patient tolerated treatment well   Behavior During Therapy WFL for tasks assessed/performed      Past Medical History  Diagnosis Date  . HEMORRHOIDS   . Hypertension   . Anemia   . Axillary mass 7/99    Left  . Cervical spinal stenosis   . Headache(784.0)   . Blood transfusion without reported diagnosis   . CARCINOMA, BREAST 1995    left  . COLON CANCER 2006    T3 N0 tumor  . Barrett's esophagus     Past Surgical History  Procedure Laterality Date  . Bone marrow transplant  1999  . Total abdominal hysterectomy w/ bilateral salpingoophorectomy  1996  . Hemicolectomy  01/2005    right  . Breast lumpectomy  11/95    left breast  . Inguinal hernia repair    . Ercp  01/21/2012    Procedure: ENDOSCOPIC RETROGRADE CHOLANGIOPANCREATOGRAPHY (ERCP);  Surgeon: Lafayette Dragon, MD;  Location: [facility];  Service: Endoscopy;  Laterality: [name];  . Neck mass excision Left 7/99  . Hysteroscopy  08/30/94  . Tunneled venous catheter placement  9/98  . Bunionectomy  3/09, 10/09    right and left  . Rotator cuff repair Right 09/08/13    full tear  . Colon surgery    . Colonoscopy      There were no vitals filed for this visit.  Visit Diagnosis:  Decreased right shoulder range of motion  Abnormal posture  Right shoulder pain  Weakness of right arm  Bilateral  low back pain without sciatica  Activity intolerance      Subjective Assessment - 08/17/15 1154    Subjective " pt reports no pain and that she has been continuing doing her HEP"   Currently in Pain? No/denies   Pain Score 0-No pain   Pain Location Shoulder   Pain Orientation Right   Pain Frequency Rarely                         [facility] Adult PT Treatment/Exercise - 08/17/15 1236    Self-Care   Self-Care Other Self-Care Comments   Other Self-Care Comments  answered pt questions regarding physician protocol and what to expect in rgard to therapy for the next few weeks.    Shoulder Exercises: Seated   Retraction AROM;Strengthening;Both;15 reps  x 2 sets    Retraction Limitations Verbal cueing to keep shoulder down   Other Seated Exercises UE ranger serratus punches 2 x 15  VC to keep shoulder down    Manual Therapy   Manual Therapy Joint mobilization;Passive ROM   Joint Mobilization grade 2 inferior joint mobs to with shoulder abducted to 45 degress, Grade 2 R scapular mobs in all directions   pt reported no pain and that she felt good.  Passive ROM shoulder abdution to 60 degreees, flexion to 90 degress with gentle oscillations during treatment                PT Education - 08/17/15 1233    Education provided Yes   Education Details updated HEP   Person(s) Educated Patient   Methods Explanation   Comprehension Verbalized understanding          PT Short Term Goals - 08/15/15 1519    PT SHORT TERM GOAL #1   Title pt will be I with initial HEP (09/08/2015)   Baseline cues   Time 4   Period Weeks   Status On-going   PT SHORT TERM GOAL #2   Title pt will improve her R shoulder AROM by >/=10 degrees of flexion/abduction to assist with functional progression (09/08/2015)   Time 4   Period Weeks   Status On-going   PT SHORT TERM GOAL #3   Title pt will be able to verbalize and demonstrate techniques to control and reduce R shoulder inflammation  and edema via RICE and HEP (09/08/2015)   Time 4   Period Weeks   Status Unable to assess   PT SHORT TERM GOAL #4   Title pt will improve her FOTO score by >/= 10 points to demonstrate improving function (09/08/2015)   Time 4   Period Weeks   Status Unable to assess           PT Long Term Goals - 08/17/15 1235    PT LONG TERM GOAL #1   Title pt will be I with all HEP given as of last visit (10/03/2015)   Time 8   Period Weeks   Status On-going   PT LONG TERM GOAL #2   Title pt will increase R shouldre flexion/ abduction to >/= 110 degrees with </=2/10 pain to assist with ADLs (10/03/2015)   Time 8   Period Weeks   Status On-going   PT LONG TERM GOAL #3   Title pt will demonstrate >/= 4-/5 strength in the r should to assist with lifting and carrying activities with </=2/10 pain (10/03/2015)   Time 8   Period Weeks   Status On-going   PT LONG TERM GOAL #4   Title pt will be able to lift and carry >/= 5# at shoulder height or higher with </= 2/10 pain to assist with ADLS ([id number])   Time 8   Period Weeks   Status On-going   PT LONG TERM GOAL #5   Title pt will improve her FOTO score to >/= 60 to demonstrate improved function ( 10/03/2015)   Time 8   Period Weeks   Status On-going               Plan - 08/17/15 1240    Clinical Impression Statement pt reports no pain today. she states she has been doing PROM with the help of her Husband,but based on the protocol for the first 3 weeks no PROM ER is allowed so halted that activity. She reported no pain during PROM flexion/ abduction and with scapular retrciton and serratus strengthenign. pt declined the need for ice following todays session. Answered pt questions regard therapy using the protocol as a reference in regard to what the next few sessions will look like.    PT Next Visit Plan Follow [facility] protocol. scapular retraction, table slide   PT Home Exercise Plan table slide to 90.  PROM flexion to 90,  ER to 45 PROM ,  Consulted and Agree with Plan of Care Patient   Family Member Consulted husband        Problem List Patient Active Problem List   Diagnosis Date Noted  . Complete rotator cuff rupture of left shoulder 09/21/2014  . Abdominal pain, epigastric 08/24/2013  . Belching 08/24/2013  . Other abnormal blood chemistry 01/21/2012  . Nonspecific (abnormal) findings on radiological and other examination of biliary tract 01/21/2012  . TIA (transient ischemic attack) 11/05/2011  . PVC's (premature ventricular contractions) 10/11/2011  . Nonspecific abnormal unspecified cardiovascular function study 12/20/2010  . HYPERTENSION, BENIGN 01/20/2009  . Malignant neoplasm of female breast ([place]) 08/23/2008  . HEMORRHOIDS 08/23/2008  . CONSTIPATION, CHRONIC 08/23/2008  . RECTAL BLEEDING 08/23/2008  . Malignant neoplasm of colon ([place]) 01/26/2005   Starr Lake PT, DPT, LAT, ATC  08/17/2015  12:43 PM      [facility] [address] [place], [place], [address] Phone: [phone]   Fax:  [phone]  Name: Eileen [name] MRN: [id number] Date of Birth: 05/23/1946

## 2015-08-17 NOTE — Patient Instructions (Signed)
   Prynce Jacober PT, DPT, LAT, ATC  [facility] Phone: [phone]     

## 2015-08-22 ENCOUNTER — Ambulatory Visit: Payer: Medicare Other | Admitting: Physical Therapy

## 2015-08-22 DIAGNOSIS — M545 Low back pain: Secondary | ICD-10-CM | POA: Diagnosis not present

## 2015-08-22 DIAGNOSIS — R293 Abnormal posture: Secondary | ICD-10-CM | POA: Diagnosis not present

## 2015-08-22 DIAGNOSIS — R29898 Other symptoms and signs involving the musculoskeletal system: Secondary | ICD-10-CM | POA: Diagnosis not present

## 2015-08-22 DIAGNOSIS — R6889 Other general symptoms and signs: Secondary | ICD-10-CM | POA: Diagnosis not present

## 2015-08-22 DIAGNOSIS — M25511 Pain in right shoulder: Secondary | ICD-10-CM | POA: Diagnosis not present

## 2015-08-22 DIAGNOSIS — M25611 Stiffness of right shoulder, not elsewhere classified: Secondary | ICD-10-CM

## 2015-08-22 NOTE — Therapy (Signed)
[place], [place], [address] Phone: [phone]   Fax:  [phone]  Physical Therapy Treatment  Patient Details  Name: Eileen [name] MRN: [id number] Date of Birth: Feb 09, 1946 Referring Provider: Ermalene Postin MD  Encounter Date: 08/22/2015      PT End of Session - 08/22/15 1321    Visit Number 4   Number of Visits 12   Date for PT Re-Evaluation 10/03/15   PT Start Time 1150   PT Stop Time 1232   PT Time Calculation (min) 42 min   Activity Tolerance Patient tolerated treatment well;No increased pain   Behavior During Therapy [facility] for tasks assessed/performed      Past Medical History  Diagnosis Date  . HEMORRHOIDS   . Hypertension   . Anemia   . Axillary mass 7/99    Left  . Cervical spinal stenosis   . Headache(784.0)   . Blood transfusion without reported diagnosis   . CARCINOMA, BREAST 1995    left  . COLON CANCER 2006    T3 N0 tumor  . Barrett's esophagus     Past Surgical History  Procedure Laterality Date  . Bone marrow transplant  1999  . Total abdominal hysterectomy w/ bilateral salpingoophorectomy  1996  . Hemicolectomy  01/2005    right  . Breast lumpectomy  11/95    left breast  . Inguinal hernia repair    . Ercp  01/21/2012    Procedure: ENDOSCOPIC RETROGRADE CHOLANGIOPANCREATOGRAPHY (ERCP);  Surgeon: Lafayette Dragon, MD;  Location: [facility];  Service: Endoscopy;  Laterality: [name];  . Neck mass excision Left 7/99  . Hysteroscopy  08/30/94  . Tunneled venous catheter placement  9/98  . Bunionectomy  3/09, 10/09    right and left  . Rotator cuff repair Right 09/08/13    full tear  . Colon surgery    . Colonoscopy      There were no vitals filed for this visit.  Visit Diagnosis:  Decreased right shoulder range of motion  Abnormal posture      Subjective Assessment - 08/22/15 1233    Subjective No pain continues.  She has been doing her home exercise.   Currently in Pain? Yes                         [facility] Adult PT Treatment/Exercise - 08/22/15 1150    Shoulder Exercises: Seated   Other Seated Exercises UE ranger serratus punches 2 x 15  VC to keep shoulder down    Manual Therapy   Passive ROM shoulder PROM/ flexion/ ABudction.  within protocol range,                    PT Short Term Goals - 08/22/15 1325    PT SHORT TERM GOAL #1   Title pt will be I with initial HEP (09/08/2015)   Baseline cues   Time 4   Period Weeks   Status On-going   PT SHORT TERM GOAL #2   Title pt will improve her R shoulder AROM by >/=10 degrees of flexion/abduction to assist with functional progression (09/08/2015)   Baseline AROM not yet allowed   Time 4   Period Weeks   Status On-going   PT SHORT TERM GOAL #3   Title pt will be able to verbalize and demonstrate techniques to control and reduce R shoulder inflammation and edema via RICE and HEP (09/08/2015)   Time  4   Period Weeks   Status Unable to assess   PT SHORT TERM GOAL #4   Time 4   Period Weeks   Status Unable to assess           PT Long Term Goals - 08/17/15 1235    PT LONG TERM GOAL #1   Title pt will be I with all HEP given as of last visit (10/03/2015)   Time 8   Period Weeks   Status On-going   PT LONG TERM GOAL #2   Title pt will increase R shouldre flexion/ abduction to >/= 110 degrees with </=2/10 pain to assist with ADLs (10/03/2015)   Time 8   Period Weeks   Status On-going   PT LONG TERM GOAL #3   Title pt will demonstrate >/= 4-/5 strength in the r should to assist with lifting and carrying activities with </=2/10 pain (10/03/2015)   Time 8   Period Weeks   Status On-going   PT LONG TERM GOAL #4   Title pt will be able to lift and carry >/= 5# at shoulder height or higher with </= 2/10 pain to assist with ADLS ([id number])   Time 8   Period Weeks   Status On-going   PT LONG TERM GOAL #5   Title pt will improve her FOTO score to >/= 60 to  demonstrate improved function ( 10/03/2015)   Time 8   Period Weeks   Status On-going               Plan - 08/22/15 1324    PT Next Visit Plan Follow [facility] protocol. scapular retraction, table slide  try flexion with UE ranger sidelying?   PT Home Exercise Plan table slide to 90.  PROM flexion to 90,  ER to 45 PROM ,    Consulted and Agree with Plan of Care Patient;Family member/caregiver   Family Member Consulted husband        Problem List Patient Active Problem List   Diagnosis Date Noted  . Complete rotator cuff rupture of left shoulder 09/21/2014  . Abdominal pain, epigastric 08/24/2013  . Belching 08/24/2013  . Other abnormal blood chemistry 01/21/2012  . Nonspecific (abnormal) findings on radiological and other examination of biliary tract 01/21/2012  . TIA (transient ischemic attack) 11/05/2011  . PVC's (premature ventricular contractions) 10/11/2011  . Nonspecific abnormal unspecified cardiovascular function study 12/20/2010  . HYPERTENSION, BENIGN 01/20/2009  . Malignant neoplasm of female breast ([place]) 08/23/2008  . HEMORRHOIDS 08/23/2008  . CONSTIPATION, CHRONIC 08/23/2008  . RECTAL BLEEDING 08/23/2008  . Malignant neoplasm of colon ([place]) 01/26/2005    Fredie Majano 08/22/2015, 1:27 PM  [facility] [address] [place], [place], [address] Phone: [phone]   Fax:  [phone]  Name: Eileen [name] MRN: [id number] Date of Birth: [birth year]    Melvenia Needles, PTA 08/22/2015 1:27 PM Phone: [phone] Fax: [phone]

## 2015-08-23 ENCOUNTER — Encounter: Payer: Self-pay | Admitting: Internal Medicine

## 2015-08-23 DIAGNOSIS — Z23 Encounter for immunization: Secondary | ICD-10-CM | POA: Diagnosis not present

## 2015-08-23 DIAGNOSIS — L570 Actinic keratosis: Secondary | ICD-10-CM | POA: Diagnosis not present

## 2015-08-23 DIAGNOSIS — L821 Other seborrheic keratosis: Secondary | ICD-10-CM | POA: Diagnosis not present

## 2015-08-23 DIAGNOSIS — L853 Xerosis cutis: Secondary | ICD-10-CM | POA: Diagnosis not present

## 2015-08-24 ENCOUNTER — Ambulatory Visit: Payer: Medicare Other | Admitting: Physical Therapy

## 2015-08-24 DIAGNOSIS — M25511 Pain in right shoulder: Secondary | ICD-10-CM | POA: Diagnosis not present

## 2015-08-24 DIAGNOSIS — M25611 Stiffness of right shoulder, not elsewhere classified: Secondary | ICD-10-CM

## 2015-08-24 DIAGNOSIS — M545 Low back pain: Secondary | ICD-10-CM | POA: Diagnosis not present

## 2015-08-24 DIAGNOSIS — R293 Abnormal posture: Secondary | ICD-10-CM | POA: Diagnosis not present

## 2015-08-24 DIAGNOSIS — R29898 Other symptoms and signs involving the musculoskeletal system: Secondary | ICD-10-CM | POA: Diagnosis not present

## 2015-08-24 DIAGNOSIS — R6889 Other general symptoms and signs: Secondary | ICD-10-CM | POA: Diagnosis not present

## 2015-08-24 NOTE — Therapy (Signed)
[place], [place], [address] Phone: [phone]   Fax:  [phone]  Physical Therapy Treatment  Patient Details  Name: Eileen [name] MRN: [id number] Date of Birth: 08-24-1945 Referring Provider: Ermalene Postin MD  Encounter Date: 08/24/2015      PT End of Session - 08/24/15 1242    Visit Number 5   Number of Visits 12   Date for PT Re-Evaluation 10/03/15   Authorization Type Medicare kx modifier 15th visit, progress note by the 9th visit.    PT Start Time 1100   PT Stop Time 1142   PT Time Calculation (min) 42 min   Activity Tolerance Patient tolerated treatment well   Behavior During Therapy WFL for tasks assessed/performed      Past Medical History  Diagnosis Date  . HEMORRHOIDS   . Hypertension   . Anemia   . Axillary mass 7/99    Left  . Cervical spinal stenosis   . Headache(784.0)   . Blood transfusion without reported diagnosis   . CARCINOMA, BREAST 1995    left  . COLON CANCER 2006    T3 N0 tumor  . Barrett's esophagus     Past Surgical History  Procedure Laterality Date  . Bone marrow transplant  1999  . Total abdominal hysterectomy w/ bilateral salpingoophorectomy  1996  . Hemicolectomy  01/2005    right  . Breast lumpectomy  11/95    left breast  . Inguinal hernia repair    . Ercp  01/21/2012    Procedure: ENDOSCOPIC RETROGRADE CHOLANGIOPANCREATOGRAPHY (ERCP);  Surgeon: Lafayette Dragon, MD;  Location: [facility];  Service: Endoscopy;  Laterality: [name];  . Neck mass excision Left 7/99  . Hysteroscopy  08/30/94  . Tunneled venous catheter placement  9/98  . Bunionectomy  3/09, 10/09    right and left  . Rotator cuff repair Right 09/08/13    full tear  . Colon surgery    . Colonoscopy      There were no vitals filed for this visit.  Visit Diagnosis:  Decreased right shoulder range of motion  Abnormal posture  Right shoulder pain  Weakness of right arm       Subjective Assessment - 08/24/15 1104    Subjective "things are going pretty good"    Currently in Pain? No/denies                         [facility] Adult PT Treatment/Exercise - 08/24/15 1109    Shoulder Exercises: Seated   Retraction AROM;Strengthening;Both;15 reps  2 sets   Other Seated Exercises UE ranger serratus punches 2 x 15  cues to keep shoulder depressed   Other Seated Exercises non-weight bicep curl 2 x 15, squeezing yellow putting power grip x 10 ea.    Shoulder Exercises: Pulleys   Flexion 2 minutes  96 degrees   ABduction 2 minutes   Manual Therapy   Joint Mobilization grade 2 inferior joint mobs to with shoulder abducted to 45 degress, Grade 2 R scapular mobs in all directions    Passive ROM shoulder PROM/ flexion 110 degrees / ABudction. ER in neutral to 20 degrees with gentle oscillations within protocol range,                  PT Education - 08/24/15 1242    Education provided Yes   Education Details HEP Review   Person(s) Educated Patient   Methods Explanation  Comprehension Verbalized understanding          PT Short Term Goals - 08/22/15 1325    PT SHORT TERM GOAL #1   Title pt will be I with initial HEP (09/08/2015)   Baseline cues   Time 4   Period Weeks   Status On-going   PT SHORT TERM GOAL #2   Title pt will improve her R shoulder AROM by >/=10 degrees of flexion/abduction to assist with functional progression (09/08/2015)   Baseline AROM not yet allowed   Time 4   Period Weeks   Status On-going   PT SHORT TERM GOAL #3   Title pt will be able to verbalize and demonstrate techniques to control and reduce R shoulder inflammation and edema via RICE and HEP (09/08/2015)   Time 4   Period Weeks   Status Unable to assess   PT SHORT TERM GOAL #4   Time 4   Period Weeks   Status Unable to assess           PT Long Term Goals - 08/17/15 1235    PT LONG TERM GOAL #1   Title pt will be I with all HEP given as of last  visit (10/03/2015)   Time 8   Period Weeks   Status On-going   PT LONG TERM GOAL #2   Title pt will increase R shouldre flexion/ abduction to >/= 110 degrees with </=2/10 pain to assist with ADLs (10/03/2015)   Time 8   Period Weeks   Status On-going   PT LONG TERM GOAL #3   Title pt will demonstrate >/= 4-/5 strength in the r should to assist with lifting and carrying activities with </=2/10 pain (10/03/2015)   Time 8   Period Weeks   Status On-going   PT LONG TERM GOAL #4   Title pt will be able to lift and carry >/= 5# at shoulder height or higher with </= 2/10 pain to assist with ADLS ([id number])   Time 8   Period Weeks   Status On-going   PT LONG TERM GOAL #5   Title pt will improve her FOTO score to >/= 60 to demonstrate improved function ( 10/03/2015)   Time 8   Period Weeks   Status On-going               Plan - 08/24/15 1243    Clinical Impression Statement Eileen [name] reports no pain today. Focused on ROM, and scapular stablization exercises. She reported no pain following todays session. Progress HEP within protocol precautions.    PT Next Visit Plan Follow [facility] protocol. scapular retraction, table slide  try flexion with UE ranger sidelying?   Consulted and Agree with Plan of Care Patient        Problem List Patient Active Problem List   Diagnosis Date Noted  . Complete rotator cuff rupture of left shoulder 09/21/2014  . Abdominal pain, epigastric 08/24/2013  . Belching 08/24/2013  . Other abnormal blood chemistry 01/21/2012  . Nonspecific (abnormal) findings on radiological and other examination of biliary tract 01/21/2012  . TIA (transient ischemic attack) 11/05/2011  . PVC's (premature ventricular contractions) 10/11/2011  . Nonspecific abnormal unspecified cardiovascular function study 12/20/2010  . HYPERTENSION, BENIGN 01/20/2009  . Malignant neoplasm of female breast ([place]) 08/23/2008  . HEMORRHOIDS 08/23/2008  . CONSTIPATION, CHRONIC 08/23/2008  .  RECTAL BLEEDING 08/23/2008  . Malignant neoplasm of colon ([place]) 01/26/2005   Fanchon Papania PT, DPT, LAT, ATC  08/24/2015  12:47 PM      [facility] [address] [place], [place], [address] Phone: [phone]   Fax:  [phone]  Name: Eileen [name] MRN: [id number] Date of Birth: 10/15/1945

## 2015-08-29 ENCOUNTER — Ambulatory Visit: Payer: Medicare Other | Admitting: Physical Therapy

## 2015-08-29 DIAGNOSIS — R6889 Other general symptoms and signs: Secondary | ICD-10-CM

## 2015-08-29 DIAGNOSIS — M25611 Stiffness of right shoulder, not elsewhere classified: Secondary | ICD-10-CM

## 2015-08-29 DIAGNOSIS — M25511 Pain in right shoulder: Secondary | ICD-10-CM

## 2015-08-29 DIAGNOSIS — R29898 Other symptoms and signs involving the musculoskeletal system: Secondary | ICD-10-CM | POA: Diagnosis not present

## 2015-08-29 DIAGNOSIS — R293 Abnormal posture: Secondary | ICD-10-CM

## 2015-08-29 DIAGNOSIS — M545 Low back pain: Secondary | ICD-10-CM | POA: Diagnosis not present

## 2015-08-29 NOTE — Patient Instructions (Signed)
Able to wean from sling at 4 weeks.  Precautions reviewed.

## 2015-08-29 NOTE — Therapy (Signed)
[place], [place], [address] Phone: [phone]   Fax:  [phone]  Physical Therapy Treatment  Patient Details  Name: Eileen [name] MRN: [id number] Date of Birth: August 29, 1945 Referring Provider: Ermalene Postin MD  Encounter Date: 08/29/2015      PT End of Session - 08/29/15 1307    Visit Number 6   Number of Visits 12   Date for PT Re-Evaluation 10/03/15   PT Start Time 7867   PT Stop Time 1235   PT Time Calculation (min) 50 min   Activity Tolerance Patient tolerated treatment well   Behavior During Therapy [facility] for tasks assessed/performed      Past Medical History  Diagnosis Date  . HEMORRHOIDS   . Hypertension   . Anemia   . Axillary mass 7/99    Left  . Cervical spinal stenosis   . Headache(784.0)   . Blood transfusion without reported diagnosis   . CARCINOMA, BREAST 1995    left  . COLON CANCER 2006    T3 N0 tumor  . Barrett's esophagus     Past Surgical History  Procedure Laterality Date  . Bone marrow transplant  1999  . Total abdominal hysterectomy w/ bilateral salpingoophorectomy  1996  . Hemicolectomy  01/2005    right  . Breast lumpectomy  11/95    left breast  . Inguinal hernia repair    . Ercp  01/21/2012    Procedure: ENDOSCOPIC RETROGRADE CHOLANGIOPANCREATOGRAPHY (ERCP);  Surgeon: Lafayette Dragon, MD;  Location: [facility];  Service: Endoscopy;  Laterality: [name];  . Neck mass excision Left 7/99  . Hysteroscopy  08/30/94  . Tunneled venous catheter placement  9/98  . Bunionectomy  3/09, 10/09    right and left  . Rotator cuff repair Right 09/08/13    full tear  . Colon surgery    . Colonoscopy      There were no vitals filed for this visit.  Visit Diagnosis:  Decreased right shoulder range of motion  Abnormal posture  Right shoulder pain  Weakness of right arm  Activity intolerance      Subjective Assessment - 08/29/15 1153    Subjective husband made a  new sling to use.  No pain   Currently in Pain? No/denies                         [facility] Adult PT Treatment/Exercise - 08/29/15 1157    Self-Care   Self-Care --  anatomy and surgery reviewed due to patient's/spouse questio   Other Self-Care Comments  precautions reviewed,  instruction to start weaning from sling starting tomorrow.  Spuose made a lighter sling for weaning.   Shoulder Exercises: Supine   Other Supine Exercises cane, chest press, flexion 10 X 2 sets   Shoulder Exercises: Seated   Retraction AROM;Strengthening;Both;15 reps  2 sets   Other Seated Exercises passive elbow exiension, supination/pronation 10 X each.   Other Seated Exercises wrist hand ,, grip 30 X digi- flex,    1,2 LBS wrist flexion/extension, supination/pronation 2 sets   Shoulder Exercises: Sidelying   Other Sidelying Exercises UE ranger flexion,  circles and "table slides" With manual scapular guidance.     Shoulder Exercises: Pulleys   Flexion 2 minutes   Flexion Limitations cues, technique   ABduction 2 minutes                PT Education - 08/29/15 1307  Education provided Yes   Education Details wean from sling 4 weeks post op   Person(s) Educated Patient;Spouse   Methods Explanation   Comprehension Verbalized understanding          PT Short Term Goals - 08/29/15 1310    PT SHORT TERM GOAL #1   Title pt will be I with initial HEP (09/08/2015)   Time 4   Period Weeks   Status Achieved   PT SHORT TERM GOAL #2   Title pt will improve her R shoulder AROM by >/=10 degrees of flexion/abduction to assist with functional progression (09/08/2015)   Baseline AROM not yet allowed   Time 4   Period Weeks   Status On-going   PT SHORT TERM GOAL #3   Title pt will be able to verbalize and demonstrate techniques to control and reduce R shoulder inflammation and edema via RICE and HEP (09/08/2015)   Baseline she understands, does not need.   Time 4   Period Weeks   Status  Achieved   PT SHORT TERM GOAL #4   Title pt will improve her FOTO score by >/= 10 points to demonstrate improving function (09/08/2015)   Time 4   Period Weeks   Status Unable to assess           PT Long Term Goals - 08/17/15 1235    PT LONG TERM GOAL #1   Title pt will be I with all HEP given as of last visit (10/03/2015)   Time 8   Period Weeks   Status On-going   PT LONG TERM GOAL #2   Title pt will increase R shouldre flexion/ abduction to >/= 110 degrees with </=2/10 pain to assist with ADLs (10/03/2015)   Time 8   Period Weeks   Status On-going   PT LONG TERM GOAL #3   Title pt will demonstrate >/= 4-/5 strength in the r should to assist with lifting and carrying activities with </=2/10 pain (10/03/2015)   Time 8   Period Weeks   Status On-going   PT LONG TERM GOAL #4   Title pt will be able to lift and carry >/= 5# at shoulder height or higher with </= 2/10 pain to assist with ADLS (08/03/1094)   Time 8   Period Weeks   Status On-going   PT LONG TERM GOAL #5   Title pt will improve her FOTO score to >/= 60 to demonstrate improved function ( 10/03/2015)   Time 8   Period Weeks   Status On-going               Plan - 08/29/15 1308    Clinical Impression Statement Mrs. Razc reports she will be 4 weeks post op tomorrow and in preparation for that her husband made a lighter sling to use intermittantly.  ROM flexion AA 120.  Abduction 85. Progress exercises per protocol.  STG#3 met.   PT Next Visit Plan Follow [facility] protocol. scapular retraction, table slide  try flexion with UE ranger sidelying.  Send MD latest PT note.   PT Home Exercise Plan continue   Consulted and Agree with Plan of Care Patient;Family member/caregiver   Family Member Consulted husband        Problem List Patient Active Problem List   Diagnosis Date Noted  . Complete rotator cuff rupture of left shoulder 09/21/2014  . Abdominal pain, epigastric 08/24/2013  . Belching 08/24/2013  . Other  abnormal blood chemistry 01/21/2012  . Nonspecific (abnormal) findings  on radiological and other examination of biliary tract 01/21/2012  . TIA (transient ischemic attack) 11/05/2011  . PVC's (premature ventricular contractions) 10/11/2011  . Nonspecific abnormal unspecified cardiovascular function study 12/20/2010  . HYPERTENSION, BENIGN 01/20/2009  . Malignant neoplasm of female breast ([place]) 08/23/2008  . HEMORRHOIDS 08/23/2008  . CONSTIPATION, CHRONIC 08/23/2008  . RECTAL BLEEDING 08/23/2008  . Malignant neoplasm of colon ([place]) 01/26/2005    Starlett Pehrson 08/29/2015, 1:15 PM  [facility] [address] [place], [place], [address] Phone: [phone]   Fax:  [phone]  Name: Addelyn Alleman MRN: [id number] Date of Birth: [birth year]    Melvenia Needles, PTA 08/29/2015 1:15 PM Phone: [phone] Fax: [phone]

## 2015-08-30 DIAGNOSIS — H35363 Drusen (degenerative) of macula, bilateral: Secondary | ICD-10-CM | POA: Diagnosis not present

## 2015-08-30 DIAGNOSIS — H2513 Age-related nuclear cataract, bilateral: Secondary | ICD-10-CM | POA: Diagnosis not present

## 2015-08-30 DIAGNOSIS — H04123 Dry eye syndrome of bilateral lacrimal glands: Secondary | ICD-10-CM | POA: Diagnosis not present

## 2015-08-30 DIAGNOSIS — H47323 Drusen of optic disc, bilateral: Secondary | ICD-10-CM | POA: Diagnosis not present

## 2015-08-30 DIAGNOSIS — H40013 Open angle with borderline findings, low risk, bilateral: Secondary | ICD-10-CM | POA: Diagnosis not present

## 2015-08-31 ENCOUNTER — Ambulatory Visit: Payer: Medicare Other | Attending: Orthopaedic Surgery | Admitting: Physical Therapy

## 2015-08-31 DIAGNOSIS — R293 Abnormal posture: Secondary | ICD-10-CM

## 2015-08-31 DIAGNOSIS — R6889 Other general symptoms and signs: Secondary | ICD-10-CM

## 2015-08-31 DIAGNOSIS — R29898 Other symptoms and signs involving the musculoskeletal system: Secondary | ICD-10-CM | POA: Diagnosis not present

## 2015-08-31 DIAGNOSIS — M25611 Stiffness of right shoulder, not elsewhere classified: Secondary | ICD-10-CM

## 2015-08-31 DIAGNOSIS — M25511 Pain in right shoulder: Secondary | ICD-10-CM | POA: Diagnosis not present

## 2015-08-31 NOTE — Therapy (Signed)
[place], [place], [address] Phone: [phone]   Fax:  [phone]  Physical Therapy Treatment  Patient Details  Name: Eileen [name] MRN: [id number] Date of Birth: 18-Mar-1946 Referring Provider: Ermalene Postin MD  Encounter Date: 08/31/2015      PT End of Session - 08/31/15 1313    Visit Number 7   Number of Visits 12   Date for PT Re-Evaluation 10/03/15   Authorization Type Medicare kx modifier 15th visit, progress note by the 9th visit.    PT Start Time 1102   PT Stop Time 1146   PT Time Calculation (min) 44 min   Activity Tolerance Patient tolerated treatment well   Behavior During Therapy WFL for tasks assessed/performed      Past Medical History  Diagnosis Date  . HEMORRHOIDS   . Hypertension   . Anemia   . Axillary mass 7/99    Left  . Cervical spinal stenosis   . Headache(784.0)   . Blood transfusion without reported diagnosis   . CARCINOMA, BREAST 1995    left  . COLON CANCER 2006    T3 N0 tumor  . Barrett's esophagus     Past Surgical History  Procedure Laterality Date  . Bone marrow transplant  1999  . Total abdominal hysterectomy w/ bilateral salpingoophorectomy  1996  . Hemicolectomy  01/2005    right  . Breast lumpectomy  11/95    left breast  . Inguinal hernia repair    . Ercp  01/21/2012    Procedure: ENDOSCOPIC RETROGRADE CHOLANGIOPANCREATOGRAPHY (ERCP);  Surgeon: Lafayette Dragon, MD;  Location: [facility];  Service: Endoscopy;  Laterality: [name];  . Neck mass excision Left 7/99  . Hysteroscopy  08/30/94  . Tunneled venous catheter placement  9/98  . Bunionectomy  3/09, 10/09    right and left  . Rotator cuff repair Right 09/08/13    full tear  . Colon surgery    . Colonoscopy      There were no vitals filed for this visit.  Visit Diagnosis:  Abnormal posture  Decreased right shoulder range of motion  Right shoulder pain  Weakness of right arm  Activity  intolerance      Subjective Assessment - 08/31/15 1104    Subjective " I do feel it a little more today"    Currently in Pain? Yes   Pain Score 3    Pain Location Shoulder   Pain Orientation Right   Pain Type Chronic pain   Pain Frequency Occasional   Aggravating Factors  [name]            [facility] PT Assessment - 08/31/15 1315    PROM   Right Shoulder Flexion 120 Degrees   Right Shoulder ABduction 85 Degrees                    [facility] Adult PT Treatment/Exercise - 08/31/15 1108    Shoulder Exercises: Supine   Other Supine Exercises ER to 30 degrees with UE range PROM x 15   Shoulder Exercises: Seated   Retraction AROM;Strengthening;Both;15 reps   Other Seated Exercises bicep curl with isometric contraction at end range 2 x 15, 2# wrist flexion/ extension, supintaiton/ pronation 2 x 10 each.     Shoulder Exercises: Sidelying   Other Sidelying Exercises UE ranger flexion,  circles and "table slides" With manual scapular guidance.  protaction w x 2x10 with UE ranger  Manual Therapy   Joint Mobilization grade 2 inferior joint mobs to with shoulder abducted to 45 degress, Grade 2 R scapular mobs in all directions    Passive ROM shoulder PROM/ flexion 120 degrees / ABudction. ER in neutral to 30 degrees with gentle oscillations within protocol range,                     PT Short Term Goals - 08/29/15 1310    PT SHORT TERM GOAL #1   Title pt will be I with initial HEP (09/08/2015)   Time 4   Period Weeks   Status Achieved   PT SHORT TERM GOAL #2   Title pt will improve her R shoulder AROM by >/=10 degrees of flexion/abduction to assist with functional progression (09/08/2015)   Baseline AROM not yet allowed   Time 4   Period Weeks   Status On-going   PT SHORT TERM GOAL #3   Title pt will be able to verbalize and demonstrate techniques to control and reduce R shoulder inflammation and edema via RICE and HEP (09/08/2015)   Baseline she  understands, does not need.   Time 4   Period Weeks   Status Achieved   PT SHORT TERM GOAL #4   Title pt will improve her FOTO score by >/= 10 points to demonstrate improving function (09/08/2015)   Time 4   Period Weeks   Status Unable to assess           PT Long Term Goals - 08/17/15 1235    PT LONG TERM GOAL #1   Title pt will be I with all HEP given as of last visit (10/03/2015)   Time 8   Period Weeks   Status On-going   PT LONG TERM GOAL #2   Title pt will increase R shouldre flexion/ abduction to >/= 110 degrees with </=2/10 pain to assist with ADLs (10/03/2015)   Time 8   Period Weeks   Status On-going   PT LONG TERM GOAL #3   Title pt will demonstrate >/= 4-/5 strength in the r should to assist with lifting and carrying activities with </=2/10 pain (10/03/2015)   Time 8   Period Weeks   Status On-going   PT LONG TERM GOAL #4   Title pt will be able to lift and carry >/= 5# at shoulder height or higher with </= 2/10 pain to assist with ADLS ([id number])   Time 8   Period Weeks   Status On-going   PT LONG TERM GOAL #5   Title pt will improve her FOTO score to >/= 60 to demonstrate improved function ( 10/03/2015)   Time 8   Period Weeks   Status On-going               Plan - 08/31/15 1313    Clinical Impression Statement [name] reports feeling a little sore from the last session. She is making great progress with PROM with gradual increase with AAROM. flexion she is able to get to 120 degrees of flexion PROM and 80 degrees of abduction. Plan to progress with exercises per protocol.    PT Next Visit Plan Follow [facility] protocol. scapular retraction, table slide  try flexion with UE ranger sidelying.  Send MD latest PT note.   Consulted and Agree with Plan of Care Patient        Problem List Patient Active Problem List   Diagnosis Date Noted  . Complete rotator cuff rupture  of left shoulder 09/21/2014  . Abdominal pain, epigastric 08/24/2013  . Belching  08/24/2013  . Other abnormal blood chemistry 01/21/2012  . Nonspecific (abnormal) findings on radiological and other examination of biliary tract 01/21/2012  . TIA (transient ischemic attack) 11/05/2011  . PVC's (premature ventricular contractions) 10/11/2011  . Nonspecific abnormal unspecified cardiovascular function study 12/20/2010  . HYPERTENSION, BENIGN 01/20/2009  . Malignant neoplasm of female breast ([place]) 08/23/2008  . HEMORRHOIDS 08/23/2008  . CONSTIPATION, CHRONIC 08/23/2008  . RECTAL BLEEDING 08/23/2008  . Malignant neoplasm of colon ([place]) 01/26/2005   Starr Lake PT, DPT, LAT, ATC  08/31/2015  1:19 PM     [facility] [address] [place], [place], [address] Phone: [phone]   Fax:  [phone]  Name: Eileen [name] MRN: [id number] Date of Birth: 05/10/1946

## 2015-09-05 ENCOUNTER — Encounter: Payer: Medicare Other | Admitting: Physical Therapy

## 2015-09-05 ENCOUNTER — Ambulatory Visit: Payer: Medicare Other | Admitting: Physical Therapy

## 2015-09-05 DIAGNOSIS — R29898 Other symptoms and signs involving the musculoskeletal system: Secondary | ICD-10-CM | POA: Diagnosis not present

## 2015-09-05 DIAGNOSIS — M25511 Pain in right shoulder: Secondary | ICD-10-CM | POA: Diagnosis not present

## 2015-09-05 DIAGNOSIS — R6889 Other general symptoms and signs: Secondary | ICD-10-CM

## 2015-09-05 DIAGNOSIS — R293 Abnormal posture: Secondary | ICD-10-CM

## 2015-09-05 DIAGNOSIS — M25611 Stiffness of right shoulder, not elsewhere classified: Secondary | ICD-10-CM

## 2015-09-05 NOTE — Therapy (Signed)
[place], [place], [address] Phone: [phone]   Fax:  [phone]  Physical Therapy Treatment  Patient Details  Name: Eileen [name] MRN: [id number] Date of Birth: 02-01-1946 Referring Provider: Ermalene Postin MD  Encounter Date: 09/05/2015      PT End of Session - 09/05/15 1752    Visit Number 8   Number of Visits 12   Date for PT Re-Evaluation 10/03/15   PT Start Time [id number]   PT Stop Time 1643   PT Time Calculation (min) 58 min   Activity Tolerance Patient tolerated treatment well   Behavior During Therapy [facility] for tasks assessed/performed      Past Medical History  Diagnosis Date  . HEMORRHOIDS   . Hypertension   . Anemia   . Axillary mass 7/99    Left  . Cervical spinal stenosis   . Headache(784.0)   . Blood transfusion without reported diagnosis   . CARCINOMA, BREAST 1995    left  . COLON CANCER 2006    T3 N0 tumor  . Barrett's esophagus     Past Surgical History  Procedure Laterality Date  . Bone marrow transplant  1999  . Total abdominal hysterectomy w/ bilateral salpingoophorectomy  1996  . Hemicolectomy  01/2005    right  . Breast lumpectomy  11/95    left breast  . Inguinal hernia repair    . Ercp  01/21/2012    Procedure: ENDOSCOPIC RETROGRADE CHOLANGIOPANCREATOGRAPHY (ERCP);  Surgeon: Lafayette Dragon, MD;  Location: [facility];  Service: Endoscopy;  Laterality: [name];  . Neck mass excision Left 7/99  . Hysteroscopy  08/30/94  . Tunneled venous catheter placement  9/98  . Bunionectomy  3/09, 10/09    right and left  . Rotator cuff repair Right 09/08/13    full tear  . Colon surgery    . Colonoscopy      There were no vitals filed for this visit.  Visit Diagnosis:  Abnormal posture  Decreased right shoulder range of motion  Right shoulder pain  Weakness of right arm  Activity intolerance      Subjective Assessment - 09/05/15 1544    Subjective Saw MD earlier  this morning.  Every Thing is fine.  OK to sleep on her shoulder as long as it is pain free.   Currently in Pain? No/denies                         [facility] Adult PT Treatment/Exercise - 09/05/15 1555    Shoulder Exercises: Seated   Other Seated Exercises UE Ranger:  Table slides flexion, Small circles with scapular guidance.    Shoulder Exercises: Sidelying   Flexion 10 reps  UR ranger AA, small osscillations flexion anf forward reach    Cryotherapy   Number Minutes Cryotherapy 10 Minutes   Cryotherapy Location Shoulder   Type of Cryotherapy --  cold pack   Manual Therapy   Passive ROM Shoulder flexion, Abduction in neutral to 30 degrees ,  flexion to 115 , in sidelying,  Scapular mobilization with upward  rotations, all with in protocol guidelines                  PT Short Term Goals - 09/05/15 1754    PT SHORT TERM GOAL #1   Title pt will be I with initial HEP (09/08/2015)   Time 4   Period Weeks   Status Achieved  PT SHORT TERM GOAL #2   Title pt will improve her R shoulder AROM by >/=10 degrees of flexion/abduction to assist with functional progression (09/08/2015)   Baseline AROM not yet allowed   Time 4   Period Weeks   Status On-going   PT SHORT TERM GOAL #3   Title pt will be able to verbalize and demonstrate techniques to control and reduce R shoulder inflammation and edema via RICE and HEP (09/08/2015)   Baseline she understands, does not need.   Time 4   Period Weeks   Status Achieved   PT SHORT TERM GOAL #4   Title pt will improve her FOTO score by >/= 10 points to demonstrate improving function (09/08/2015)   Time 4   Period Weeks   Status Unable to assess           PT Long Term Goals - 08/17/15 1235    PT LONG TERM GOAL #1   Title pt will be I with all HEP given as of last visit (10/03/2015)   Time 8   Period Weeks   Status On-going   PT LONG TERM GOAL #2   Title pt will increase R shouldre flexion/ abduction to >/= 110 degrees  with </=2/10 pain to assist with ADLs (10/03/2015)   Time 8   Period Weeks   Status On-going   PT LONG TERM GOAL #3   Title pt will demonstrate >/= 4-/5 strength in the r should to assist with lifting and carrying activities with </=2/10 pain (10/03/2015)   Time 8   Period Weeks   Status On-going   PT LONG TERM GOAL #4   Title pt will be able to lift and carry >/= 5# at shoulder height or higher with </= 2/10 pain to assist with ADLS ([id number])   Time 8   Period Weeks   Status On-going   PT LONG TERM GOAL #5   Title pt will improve her FOTO score to >/= 60 to demonstrate improved function ( 10/03/2015)   Time 8   Period Weeks   Status On-going               Plan - 09/05/15 1752    Clinical Impression Statement Pain 5/10 post stretching.  Ice helpful.  She will be 5 weeks post op tomorrow.  She is happy with her PT.   PT Next Visit Plan Follow [facility] protocol. scapular retraction, table slide  try flexion with UE ranger sidelying.  Send MD latest PT note.   Consulted and Agree with Plan of Care Patient        Problem List Patient Active Problem List   Diagnosis Date Noted  . Complete rotator cuff rupture of left shoulder 09/21/2014  . Abdominal pain, epigastric 08/24/2013  . Belching 08/24/2013  . Other abnormal blood chemistry 01/21/2012  . Nonspecific (abnormal) findings on radiological and other examination of biliary tract 01/21/2012  . TIA (transient ischemic attack) 11/05/2011  . PVC's (premature ventricular contractions) 10/11/2011  . Nonspecific abnormal unspecified cardiovascular function study 12/20/2010  . HYPERTENSION, BENIGN 01/20/2009  . Malignant neoplasm of female breast ([place]) 08/23/2008  . HEMORRHOIDS 08/23/2008  . CONSTIPATION, CHRONIC 08/23/2008  . RECTAL BLEEDING 08/23/2008  . Malignant neoplasm of colon [facility]) 01/26/2005    Eileen [name] 09/05/2015, 5:55 PM  [facility] [address] [place], [place], [address] Phone: [phone]   Fax:  [phone]  Name: Eileen [name] MRN: [id number] Date of Birth: Apr 04, 1946  Melvenia Needles, PTA 09/05/2015 5:55 PM Phone: [phone] Fax: [phone]

## 2015-09-07 ENCOUNTER — Ambulatory Visit: Payer: Medicare Other | Admitting: Physical Therapy

## 2015-09-07 DIAGNOSIS — R29898 Other symptoms and signs involving the musculoskeletal system: Secondary | ICD-10-CM | POA: Diagnosis not present

## 2015-09-07 DIAGNOSIS — R6889 Other general symptoms and signs: Secondary | ICD-10-CM | POA: Diagnosis not present

## 2015-09-07 DIAGNOSIS — M25511 Pain in right shoulder: Secondary | ICD-10-CM | POA: Diagnosis not present

## 2015-09-07 DIAGNOSIS — R293 Abnormal posture: Secondary | ICD-10-CM

## 2015-09-07 DIAGNOSIS — M25611 Stiffness of right shoulder, not elsewhere classified: Secondary | ICD-10-CM

## 2015-09-07 NOTE — Therapy (Signed)
[place], [place], [address] Phone: [phone]   Fax:  [phone]  Physical Therapy Treatment  Patient Details  Name: Eileen [name] MRN: [id number] Date of Birth: 03-25-1946 Referring Provider: Ermalene Postin MD  Encounter Date: 09/07/2015      PT End of Session - 09/07/15 1144    Visit Number 9   Number of Visits 10   Date for PT Re-Evaluation 10/03/15   Authorization Type Medicare kx modifier 15th visit, progress note by the 9th visit.    PT Start Time 1100   PT Stop Time 1200   PT Time Calculation (min) 60 min   Activity Tolerance Patient tolerated treatment well   Behavior During Therapy WFL for tasks assessed/performed      Past Medical History  Diagnosis Date  . HEMORRHOIDS   . Hypertension   . Anemia   . Axillary mass 7/99    Left  . Cervical spinal stenosis   . Headache(784.0)   . Blood transfusion without reported diagnosis   . CARCINOMA, BREAST 1995    left  . COLON CANCER 2006    T3 N0 tumor  . Barrett's esophagus     Past Surgical History  Procedure Laterality Date  . Bone marrow transplant  1999  . Total abdominal hysterectomy w/ bilateral salpingoophorectomy  1996  . Hemicolectomy  01/2005    right  . Breast lumpectomy  11/95    left breast  . Inguinal hernia repair    . Ercp  01/21/2012    Procedure: ENDOSCOPIC RETROGRADE CHOLANGIOPANCREATOGRAPHY (ERCP);  Surgeon: Lafayette Dragon, MD;  Location: [facility];  Service: Endoscopy;  Laterality: [name];  . Neck mass excision Left 7/99  . Hysteroscopy  08/30/94  . Tunneled venous catheter placement  9/98  . Bunionectomy  3/09, 10/09    right and left  . Rotator cuff repair Right 09/08/13    full tear  . Colon surgery    . Colonoscopy      There were no vitals filed for this visit.  Visit Diagnosis:  Abnormal posture  Decreased right shoulder range of motion  Right shoulder pain  Weakness of right arm  Activity  intolerance      Subjective Assessment - 09/07/15 1059    Subjective "I am feeling alittle more sore today and it has been feeling like this since the last session"   Currently in Pain? Yes   Pain Score 5    Pain Location Shoulder   Pain Orientation Right   Pain Descriptors / Indicators Aching;Sore   Pain Type Chronic pain   Pain Onset More than a month ago   Pain Frequency Constant   Aggravating Factors  unknown   Pain Relieving Factors medication                         [facility] Adult PT Treatment/Exercise - 09/07/15 1152    Modalities   Modalities Electrical Stimulation   Cryotherapy   Number Minutes Cryotherapy 15 Minutes   Cryotherapy Location Shoulder   Type of Cryotherapy Ice pack  in supine   Electrical Stimulation   Electrical Stimulation Location R shoulder   Electrical Stimulation Action IFC   Electrical Stimulation Parameters L 14 x 15 min, 100% scan   Electrical Stimulation Goals Pain   Manual Therapy   Manual Therapy Soft tissue mobilization   Soft tissue mobilization instrument assisted STM over the lateral deltoid, proximal bicep  Passive ROM flexion/ abduction with gentle oscillations   following manual she reported pain dropped to 2/10                PT Education - 09/07/15 1143    Education provided Yes   Education Details E-stim education   Person(s) Educated Patient   Methods Explanation   Comprehension Verbalized understanding          PT Short Term Goals - 09/05/15 1754    PT SHORT TERM GOAL #1   Title pt will be I with initial HEP (09/08/2015)   Time 4   Period Weeks   Status Achieved   PT SHORT TERM GOAL #2   Title pt will improve her R shoulder AROM by >/=10 degrees of flexion/abduction to assist with functional progression (09/08/2015)   Baseline AROM not yet allowed   Time 4   Period Weeks   Status On-going   PT SHORT TERM GOAL #3   Title pt will be able to verbalize and demonstrate techniques to control  and reduce R shoulder inflammation and edema via RICE and HEP (09/08/2015)   Baseline she understands, does not need.   Time 4   Period Weeks   Status Achieved   PT SHORT TERM GOAL #4   Title pt will improve her FOTO score by >/= 10 points to demonstrate improving function (09/08/2015)   Time 4   Period Weeks   Status Unable to assess           PT Long Term Goals - 08/17/15 1235    PT LONG TERM GOAL #1   Title pt will be I with all HEP given as of last visit (10/03/2015)   Time 8   Period Weeks   Status On-going   PT LONG TERM GOAL #2   Title pt will increase R shouldre flexion/ abduction to >/= 110 degrees with </=2/10 pain to assist with ADLs (10/03/2015)   Time 8   Period Weeks   Status On-going   PT LONG TERM GOAL #3   Title pt will demonstrate >/= 4-/5 strength in the r should to assist with lifting and carrying activities with </=2/10 pain (10/03/2015)   Time 8   Period Weeks   Status On-going   PT LONG TERM GOAL #4   Title pt will be able to lift and carry >/= 5# at shoulder height or higher with </= 2/10 pain to assist with ADLS ([id number])   Time 8   Period Weeks   Status On-going   PT LONG TERM GOAL #5   Title pt will improve her FOTO score to >/= 60 to demonstrate improved function ( 10/03/2015)   Time 8   Period Weeks   Status On-going               Plan - 09/07/15 1144    Clinical Impression Statement Mrs. Goucher reports having increased pain today 5/10. Focused todays session on pain relief, which she reports relief following STM wtih rock blade and gentle PROM with oscillations she reported pain dropped to 2/10. Utlilized e-stim with ice following treatment to calm down pain wich after treatment she rpeorted pain dropped to 1/10.    PT Next Visit Plan Follow [facility] protocol. scapular retraction, table slide  try flexion with UE ranger sidelying. G-code, progress note. measure ROM   Consulted and Agree with Plan of Care Patient        Problem List Patient  Active Problem List   Diagnosis  Date Noted  . Complete rotator cuff rupture of left shoulder 09/21/2014  . Abdominal pain, epigastric 08/24/2013  . Belching 08/24/2013  . Other abnormal blood chemistry 01/21/2012  . Nonspecific (abnormal) findings on radiological and other examination of biliary tract 01/21/2012  . TIA (transient ischemic attack) 11/05/2011  . PVC's (premature ventricular contractions) 10/11/2011  . Nonspecific abnormal unspecified cardiovascular function study 12/20/2010  . HYPERTENSION, BENIGN 01/20/2009  . Malignant neoplasm of female breast ([place]) 08/23/2008  . HEMORRHOIDS 08/23/2008  . CONSTIPATION, CHRONIC 08/23/2008  . RECTAL BLEEDING 08/23/2008  . Malignant neoplasm of colon ([place]) 01/26/2005   Starr Lake PT, DPT, LAT, ATC  09/07/2015  12:02 PM     [facility] [address] [place], [place], [address] Phone: [phone]   Fax:  [phone]  Name: Eileen [name] MRN: [id number] Date of Birth: [birth year]

## 2015-09-13 ENCOUNTER — Ambulatory Visit: Payer: Medicare Other | Admitting: Physical Therapy

## 2015-09-13 DIAGNOSIS — M25511 Pain in right shoulder: Secondary | ICD-10-CM | POA: Diagnosis not present

## 2015-09-13 DIAGNOSIS — R29898 Other symptoms and signs involving the musculoskeletal system: Secondary | ICD-10-CM | POA: Diagnosis not present

## 2015-09-13 DIAGNOSIS — R6889 Other general symptoms and signs: Secondary | ICD-10-CM | POA: Diagnosis not present

## 2015-09-13 DIAGNOSIS — R293 Abnormal posture: Secondary | ICD-10-CM | POA: Diagnosis not present

## 2015-09-13 DIAGNOSIS — M25611 Stiffness of right shoulder, not elsewhere classified: Secondary | ICD-10-CM

## 2015-09-13 NOTE — Therapy (Signed)
[place], [place], [address] Phone: [phone]   Fax:  [phone]  Physical Therapy Treatment  Patient Details  Name: Eileen [name] MRN: [id number] Date of Birth: 10/22/1945 Referring Provider: Ermalene Postin MD  Encounter Date: 09/13/2015      PT End of Session - 09/13/15 1520    Visit Number 10   Number of Visits 10   Date for PT Re-Evaluation 10/03/15   Authorization Type Medicare kx modifier 15th visit, progress note by the 9th visit.    PT Start Time 1419   PT Stop Time 1502   PT Time Calculation (min) 43 min   Activity Tolerance Patient tolerated treatment well;No increased pain   Behavior During Therapy [facility] for tasks assessed/performed      Past Medical History  Diagnosis Date  . HEMORRHOIDS   . Hypertension   . Anemia   . Axillary mass 7/99    Left  . Cervical spinal stenosis   . Headache(784.0)   . Blood transfusion without reported diagnosis   . CARCINOMA, BREAST 1995    left  . COLON CANCER 2006    T3 N0 tumor  . Barrett's esophagus     Past Surgical History  Procedure Laterality Date  . Bone marrow transplant  1999  . Total abdominal hysterectomy w/ bilateral salpingoophorectomy  1996  . Hemicolectomy  01/2005    right  . Breast lumpectomy  11/95    left breast  . Inguinal hernia repair    . Ercp  01/21/2012    Procedure: ENDOSCOPIC RETROGRADE CHOLANGIOPANCREATOGRAPHY (ERCP);  Surgeon: Lafayette Dragon, MD;  Location: [facility];  Service: Endoscopy;  Laterality: [name];  . Neck mass excision Left 7/99  . Hysteroscopy  08/30/94  . Tunneled venous catheter placement  9/98  . Bunionectomy  3/09, 10/09    right and left  . Rotator cuff repair Right 09/08/13    full tear  . Colon surgery    . Colonoscopy      There were no vitals filed for this visit.  Visit Diagnosis:  Abnormal posture  Decreased right shoulder range of motion  Right shoulder pain      Subjective  Assessment - 09/13/15 1421    Subjective "I could feel better, seems like things are going backwards"   Currently in Pain? Yes   Pain Score 3    Pain Location Shoulder   Pain Orientation Right   Pain Descriptors / Indicators Aching   Pain Type Chronic pain   Aggravating Factors  lifting arm, doing too much   Pain Relieving Factors massaging                         [facility] Adult PT Treatment/Exercise - 09/13/15 0001    Shoulder Exercises: Supine   External Rotation PROM;Right;20 reps;Limitations   External Rotation Limitations gentle endrange stretch, max ER 52 degrees   Flexion PROM;Right;20 reps;Limitations   Flexion Limitations gently endrange stretch, max 108 degrees.    ABduction PROM;Right;15 reps   Shoulder Exercises: Seated   Other Seated Exercises seated wand ER, education on technique   Other Seated Exercises seated table supported flexion   Manual Therapy   Manual Therapy Soft tissue mobilization   Manual therapy comments STM to deltoid, biceps regions                  PT Short Term Goals - 09/13/15 1427    PT SHORT  TERM GOAL #1   Title pt will be I with initial HEP (09/08/2015)   Baseline cues   Time 4   Status Achieved   PT SHORT TERM GOAL #2   Title pt will improve her R shoulder AROM by >/=10 degrees of flexion/abduction to assist with functional progression (09/08/2015)   Baseline AROM not yet allowed   Time 4   Period Weeks   Status On-going   PT SHORT TERM GOAL #3   Title pt will be able to verbalize and demonstrate techniques to control and reduce R shoulder inflammation and edema via RICE and HEP (09/08/2015)   Baseline she understands, does not need.   Status Achieved   PT SHORT TERM GOAL #4   Title pt will improve her FOTO score by >/= 10 points to demonstrate improving function (09/08/2015)   Baseline 88% limitation on 09/13/15   Period Weeks   Status On-going           PT Long Term Goals - 09/13/15 1454    PT LONG TERM  GOAL #1   Title pt will be I with all HEP given as of last visit (10/03/2015)   Time 8   Period Weeks   Status On-going   PT LONG TERM GOAL #2   Title pt will increase R shouldre flexion/ abduction to >/= 110 degrees with </=2/10 pain to assist with ADLs (10/03/2015)   Baseline 108 degrees on 09/13/15   Time 8   Period Weeks   Status On-going   PT LONG TERM GOAL #3   Title pt will demonstrate >/= 4-/5 strength in the r should to assist with lifting and carrying activities with </=2/10 pain (10/03/2015)   Baseline not allowed per protocol at this time.   Time 8   Period Weeks   Status On-going   PT LONG TERM GOAL #4   Title pt will be able to lift and carry >/= 5# at shoulder height or higher with </= 2/10 pain to assist with ADLS ([id number])   Baseline not allowed per protocol at this time   Time 8   PT LONG TERM GOAL #5   Title pt will improve her FOTO score to >/= 60 to demonstrate improved function ( 10/03/2015)   Baseline FOTO 88% limitation on 09/13/15   Time 8   Period Weeks   Status On-going               Plan - 09/13/15 1521    Clinical Impression Statement Patient reporting that she is having difficulty with pain as it has increased to 3/10 overall. Review of the patient's HEP was performed with modifications to technique made as needed. The patient does have deficits with ROM but able to improve by the end of session to 108 degrees flexion and 52 degrees external rotation. Patient remains appropriate for ongoing PT sessions to progress ROM and strength as per protocol.    Pt will benefit from skilled therapeutic intervention in order to improve on the following deficits Decreased activity tolerance;Decreased endurance;Impaired UE functional use;Decreased strength;Hypomobility;Impaired flexibility;Increased edema;Improper body mechanics;Postural dysfunction;Decreased range of motion   Rehab Potential Good   PT Frequency 2x / week   PT Duration 8 weeks   PT  Treatment/Interventions ADLs/Self Care Home Management;Electrical Stimulation;Iontophoresis 4mg /ml Dexamethasone;Moist Heat;Therapeutic exercise;Therapeutic activities;Manual techniques;Vasopneumatic Device;Dry needling;Patient/family education;Cryotherapy;Passive range of motion;Ultrasound   PT Next Visit Plan Continue with [facility] protocol, patient now 6 weeks post-op. Assess pain and modifications to HEP.    PT Home  Exercise Plan progress per protocol   Consulted and Agree with Plan of Care Patient          G-Codes - 09/27/15 1525    Functional Assessment Tool Used FOTO 88% limited   Functional Limitation Carrying, moving and handling objects   Carrying, Moving and Handling Objects Current Status [phone]) At least 80 percent but less than 100 percent impaired, limited or restricted   Carrying, Moving and Handling Objects Goal Status [id number]) At least 20 percent but less than 40 percent impaired, limited or restricted      Problem List Patient Active Problem List   Diagnosis Date Noted  . Complete rotator cuff rupture of left shoulder 09/21/2014  . Abdominal pain, epigastric 08/24/2013  . Belching 08/24/2013  . Other abnormal blood chemistry 01/21/2012  . Nonspecific (abnormal) findings on radiological and other examination of biliary tract 01/21/2012  . TIA (transient ischemic attack) 11/05/2011  . PVC's (premature ventricular contractions) 10/11/2011  . Nonspecific abnormal unspecified cardiovascular function study 12/20/2010  . HYPERTENSION, BENIGN 01/20/2009  . Malignant neoplasm of female breast ([place]) 08/23/2008  . HEMORRHOIDS 08/23/2008  . CONSTIPATION, CHRONIC 08/23/2008  . RECTAL BLEEDING 08/23/2008  . Malignant neoplasm of colon [facility]) 01/26/2005    Cassell Clement, PT, CSCS Pager [phone]  27-Sep-2015, 3:29 PM  [facility] [address] [place], [place], [address] Phone: [phone]   Fax:   [phone]  Name: Eileen [name] MRN: [id number] Date of Birth: 1945-08-26   Physical Therapy Progress Note  Dates of Reporting Period: 08/08/15 to 09/14/15  Objective Reports of Subjective Statement: as noted above  Objective Measurements: as noted above  Goal Update: as noted above  Plan: as noted above  Reason Skilled Services are Required: as noted above  Cassell Clement, [place], [place] Pager [phone] Office [phone]

## 2015-09-14 ENCOUNTER — Ambulatory Visit: Payer: Medicare Other | Admitting: Physical Therapy

## 2015-09-14 DIAGNOSIS — R293 Abnormal posture: Secondary | ICD-10-CM

## 2015-09-14 DIAGNOSIS — M25611 Stiffness of right shoulder, not elsewhere classified: Secondary | ICD-10-CM

## 2015-09-14 DIAGNOSIS — R6889 Other general symptoms and signs: Secondary | ICD-10-CM

## 2015-09-14 DIAGNOSIS — R29898 Other symptoms and signs involving the musculoskeletal system: Secondary | ICD-10-CM

## 2015-09-14 DIAGNOSIS — M25511 Pain in right shoulder: Secondary | ICD-10-CM | POA: Diagnosis not present

## 2015-09-14 NOTE — Therapy (Signed)
[place], [place], [address] Phone: [phone]   Fax:  [phone]  Physical Therapy Treatment  Patient Details  Name: Eileen [name] MRN: [id number] Date of Birth: 1946-02-28 Referring Provider: Ermalene Postin MD  Encounter Date: 09/14/2015      PT End of Session - 09/14/15 1700    Visit Number 11   Number of Visits 20   Date for PT Re-Evaluation 10/03/15   Authorization Type Medicare kx modifier 15th visit, progress note by the 9th visit.    PT Start Time 1329   PT Stop Time 1412   PT Time Calculation (min) 43 min   Activity Tolerance Patient tolerated treatment well   Behavior During Therapy [facility] for tasks assessed/performed      Past Medical History  Diagnosis Date  . HEMORRHOIDS   . Hypertension   . Anemia   . Axillary mass 7/99    Left  . Cervical spinal stenosis   . Headache(784.0)   . Blood transfusion without reported diagnosis   . CARCINOMA, BREAST 1995    left  . COLON CANCER 2006    T3 N0 tumor  . Barrett's esophagus     Past Surgical History  Procedure Laterality Date  . Bone marrow transplant  1999  . Total abdominal hysterectomy w/ bilateral salpingoophorectomy  1996  . Hemicolectomy  01/2005    right  . Breast lumpectomy  11/95    left breast  . Inguinal hernia repair    . Ercp  01/21/2012    Procedure: ENDOSCOPIC RETROGRADE CHOLANGIOPANCREATOGRAPHY (ERCP);  Surgeon: Lafayette Dragon, MD;  Location: [facility];  Service: Endoscopy;  Laterality: [name];  . Neck mass excision Left 7/99  . Hysteroscopy  08/30/94  . Tunneled venous catheter placement  9/98  . Bunionectomy  3/09, 10/09    right and left  . Rotator cuff repair Right 09/08/13    full tear  . Colon surgery    . Colonoscopy      There were no vitals filed for this visit.  Visit Diagnosis:  Abnormal posture  Decreased right shoulder range of motion  Right shoulder pain  Weakness of right arm  Activity  intolerance      Subjective Assessment - 09/14/15 1331    Subjective Feeling about the same as yesterday   Currently in Pain? No/denies                         [facility] Adult PT Treatment/Exercise - 09/14/15 0001    Shoulder Exercises: Supine   External Rotation PROM;Right;20 reps;Limitations   External Rotation Limitations gentle endrange stretch   Flexion PROM;Right   Flexion Limitations progression into range as tolerated with gently endrange stretch, max 126 degrees   ABduction PROM;Right;15 reps   Shoulder Exercises: Isometric Strengthening   External Rotation Limitations   External Rotation Limitations 1 x 10, 5 sec hold, min resist   Internal Rotation Limitations   Internal Rotation Limitations 1 x10, 5 sec hold, min resist   Manual Therapy   Manual Therapy Joint mobilization;Soft tissue mobilization   Manual therapy comments STM to deltoid, biceps regions   Joint Mobilization Grade 2-3 GH joint mobs, A-P, distraction, inferior.                 PT Education - 09/14/15 1659    Education provided Yes   Education Details reviewed need to follow HEP   Person(s) Educated Patient  Methods Explanation   Comprehension Verbalized understanding          PT Short Term Goals - 09/21/15 1427    PT SHORT TERM GOAL #1   Title pt will be I with initial HEP (09/08/2015)   Baseline cues   Time 4   Status Achieved   PT SHORT TERM GOAL #2   Title pt will improve her R shoulder AROM by >/=10 degrees of flexion/abduction to assist with functional progression (09/08/2015)   Baseline AROM not yet allowed   Time 4   Period Weeks   Status On-going   PT SHORT TERM GOAL #3   Title pt will be able to verbalize and demonstrate techniques to control and reduce R shoulder inflammation and edema via RICE and HEP (09/08/2015)   Baseline she understands, does not need.   Status Achieved   PT SHORT TERM GOAL #4   Title pt will improve her FOTO score by >/= 10 points to  demonstrate improving function (09/08/2015)   Baseline 88% limitation on 09/21/2015   Period Weeks   Status On-going           PT Long Term Goals - September 21, 2015 1454    PT LONG TERM GOAL #1   Title pt will be I with all HEP given as of last visit (10/03/2015)   Time 8   Period Weeks   Status On-going   PT LONG TERM GOAL #2   Title pt will increase R shouldre flexion/ abduction to >/= 110 degrees with </=2/10 pain to assist with ADLs (10/03/2015)   Baseline 108 degrees on 09-21-2015   Time 8   Period Weeks   Status On-going   PT LONG TERM GOAL #3   Title pt will demonstrate >/= 4-/5 strength in the r should to assist with lifting and carrying activities with </=2/10 pain (10/03/2015)   Baseline not allowed per protocol at this time.   Time 8   Period Weeks   Status On-going   PT LONG TERM GOAL #4   Title pt will be able to lift and carry >/= 5# at shoulder height or higher with </= 2/10 pain to assist with ADLS ([id number])   Baseline not allowed per protocol at this time   Time 8   PT LONG TERM GOAL #5   Title pt will improve her FOTO score to >/= 60 to demonstrate improved function ( 10/03/2015)   Baseline FOTO 88% limitation on 21-Sep-2015   Time 8   Period Weeks   Status On-going               Plan - 09/14/15 1703    Clinical Impression Statement Patient reporting that she was not having pain at the time of the treatment. Session focused on ROM progression but able to start min resisted IR/ER. Shoulder flexion increased to 126 degrees and the patient denied any increased pain by the end of the sesion.    PT Next Visit Plan Continue with [facility] protocol, patient now 6 weeks post-op. Assess pain and modifications to HEP. Assess response to isometrics.    PT Home Exercise Plan progress per protocol, possible addition of gently isometrics if appropriate   Consulted and Agree with Plan of Care Patient          G-Codes - 09/21/2015 1525    Functional Assessment Tool Used FOTO 88% limited    Functional Limitation Carrying, moving and handling objects   Carrying, Moving and Handling Objects Current Status [id number]) At  least 80 percent but less than 100 percent impaired, limited or restricted   Carrying, Moving and Handling Objects Goal Status [id number]) At least 20 percent but less than 40 percent impaired, limited or restricted      Problem List Patient Active Problem List   Diagnosis Date Noted  . Complete rotator cuff rupture of left shoulder 09/21/2014  . Abdominal pain, epigastric 08/24/2013  . Belching 08/24/2013  . Other abnormal blood chemistry 01/21/2012  . Nonspecific (abnormal) findings on radiological and other examination of biliary tract 01/21/2012  . TIA (transient ischemic attack) 11/05/2011  . PVC's (premature ventricular contractions) 10/11/2011  . Nonspecific abnormal unspecified cardiovascular function study 12/20/2010  . HYPERTENSION, BENIGN 01/20/2009  . Malignant neoplasm of female breast ([place]) 08/23/2008  . HEMORRHOIDS 08/23/2008  . CONSTIPATION, CHRONIC 08/23/2008  . RECTAL BLEEDING 08/23/2008  . Malignant neoplasm of colon [facility]) 01/26/2005   Cassell Clement, PT, CSCS Pager [phone]  09/14/2015, 5:08 PM  [facility] [address] [place], [place], [address] Phone: [phone]   Fax:  [phone]  Name: Arionne Pharris MRN: [id number] Date of Birth: 1946-05-16

## 2015-09-19 ENCOUNTER — Ambulatory Visit: Payer: Medicare Other | Admitting: Physical Therapy

## 2015-09-19 DIAGNOSIS — R293 Abnormal posture: Secondary | ICD-10-CM | POA: Diagnosis not present

## 2015-09-19 DIAGNOSIS — R6889 Other general symptoms and signs: Secondary | ICD-10-CM

## 2015-09-19 DIAGNOSIS — R29898 Other symptoms and signs involving the musculoskeletal system: Secondary | ICD-10-CM | POA: Diagnosis not present

## 2015-09-19 DIAGNOSIS — M25511 Pain in right shoulder: Secondary | ICD-10-CM

## 2015-09-19 DIAGNOSIS — M25611 Stiffness of right shoulder, not elsewhere classified: Secondary | ICD-10-CM

## 2015-09-19 NOTE — Therapy (Signed)
[place], [place], [address] Phone: [phone]   Fax:  [phone]  Physical Therapy Treatment  Patient Details  Name: Eileen [name] MRN: [id number] Date of Birth: 22-May-1946 Referring Provider: Ermalene Postin MD  Encounter Date: 09/19/2015      PT End of Session - 09/19/15 1500    Visit Number 12   Number of Visits 20   Date for PT Re-Evaluation 10/03/15   Authorization Type Medicare kx modifier 15th visit    PT Start Time 1416   PT Stop Time 1510   PT Time Calculation (min) 54 min   Activity Tolerance Patient tolerated treatment well   Behavior During Therapy [facility] for tasks assessed/performed      Past Medical History  Diagnosis Date  . HEMORRHOIDS   . Hypertension   . Anemia   . Axillary mass 7/99    Left  . Cervical spinal stenosis   . Headache(784.0)   . Blood transfusion without reported diagnosis   . CARCINOMA, BREAST 1995    left  . COLON CANCER 2006    T3 N0 tumor  . Barrett's esophagus     Past Surgical History  Procedure Laterality Date  . Bone marrow transplant  1999  . Total abdominal hysterectomy w/ bilateral salpingoophorectomy  1996  . Hemicolectomy  01/2005    right  . Breast lumpectomy  11/95    left breast  . Inguinal hernia repair    . Ercp  01/21/2012    Procedure: ENDOSCOPIC RETROGRADE CHOLANGIOPANCREATOGRAPHY (ERCP);  Surgeon: Lafayette Dragon, MD;  Location: [facility];  Service: Endoscopy;  Laterality: [name];  . Neck mass excision Left 7/99  . Hysteroscopy  08/30/94  . Tunneled venous catheter placement  9/98  . Bunionectomy  3/09, 10/09    right and left  . Rotator cuff repair Right 09/08/13    full tear  . Colon surgery    . Colonoscopy      There were no vitals filed for this visit.  Visit Diagnosis:  Abnormal posture  Decreased right shoulder range of motion  Right shoulder pain  Weakness of right arm  Activity intolerance      Subjective  Assessment - 09/19/15 1419    Subjective "I am not sure how my shoulder is doing"   Currently in Pain? Yes   Pain Score 2    Pain Location Shoulder   Pain Orientation Right   Pain Descriptors / Indicators Aching   Pain Type Chronic pain   Pain Onset More than a month ago   Pain Frequency Occasional   Aggravating Factors  lifting arm, moving it around, doing too much   Pain Relieving Factors massage,             [facility] PT Assessment - 09/19/15 0001    PROM   Right Shoulder Flexion 125 Degrees  active assist                     [facility] Adult PT Treatment/Exercise - 09/19/15 0001    Shoulder Exercises: Supine   External Rotation PROM;Right;20 reps;Limitations   Flexion PROM;Right   ABduction PROM;Right;15 reps   Modalities   Modalities Moist Heat   Moist Heat Therapy   Number Minutes Moist Heat 10 Minutes   Moist Heat Location Shoulder  and R bicep   Electrical Stimulation   Electrical Stimulation Location R shoulder   Electrical Stimulation Action Pre Mod   Electrical Stimulation Parameters L  14 with pads covering bicep proximal/distal and over posterolateral aspect of shoulder.x 10 min   Electrical Stimulation Goals Pain   Manual Therapy   Manual therapy comments manual trigger point release over the bicep working distal to proximal  reported relief of pain   Joint Mobilization Grade 2-3 GH joint mobs, A-P, distraction, inferior.    Soft tissue mobilization instrument assisted STM to distal/ proximal bicep   Passive ROM flexion/ abduction with gentle oscillations                 PT Education - 09/19/15 1500    Education provided Yes   Education Details manual trigger point release benefits   Person(s) Educated Patient   Methods Explanation   Comprehension Verbalized understanding          PT Short Term Goals - 09/13/15 1427    PT SHORT TERM GOAL #1   Title pt will be I with initial HEP (09/08/2015)   Baseline cues   Time 4   Status  Achieved   PT SHORT TERM GOAL #2   Title pt will improve her R shoulder AROM by >/=10 degrees of flexion/abduction to assist with functional progression (09/08/2015)   Baseline AROM not yet allowed   Time 4   Period Weeks   Status On-going   PT SHORT TERM GOAL #3   Title pt will be able to verbalize and demonstrate techniques to control and reduce R shoulder inflammation and edema via RICE and HEP (09/08/2015)   Baseline she understands, does not need.   Status Achieved   PT SHORT TERM GOAL #4   Title pt will improve her FOTO score by >/= 10 points to demonstrate improving function (09/08/2015)   Baseline 88% limitation on 09/13/15   Period Weeks   Status On-going           PT Long Term Goals - 09/13/15 1454    PT LONG TERM GOAL #1   Title pt will be I with all HEP given as of last visit (10/03/2015)   Time 8   Period Weeks   Status On-going   PT LONG TERM GOAL #2   Title pt will increase R shouldre flexion/ abduction to >/= 110 degrees with </=2/10 pain to assist with ADLs (10/03/2015)   Baseline 108 degrees on 09/13/15   Time 8   Period Weeks   Status On-going   PT LONG TERM GOAL #3   Title pt will demonstrate >/= 4-/5 strength in the r should to assist with lifting and carrying activities with </=2/10 pain (10/03/2015)   Baseline not allowed per protocol at this time.   Time 8   Period Weeks   Status On-going   PT LONG TERM GOAL #4   Title pt will be able to lift and carry >/= 5# at shoulder height or higher with </= 2/10 pain to assist with ADLS ([id number])   Baseline not allowed per protocol at this time   Time 8   PT LONG TERM GOAL #5   Title pt will improve her FOTO score to >/= 60 to demonstrate improved function ( 10/03/2015)   Baseline FOTO 88% limitation on 09/13/15   Time 8   Period Weeks   Status On-going               Plan - 09/19/15 1501    Clinical Impression Statement Pt reports having pain in the bicep today. upon further assessment she demonstrates  tightness in the bicep with multiple  palpable trigger points. focused on manual to calm down tightness of the bicep via manual trigger point release and instrument assisted STM. she was able to perform all other exercises following with minimal report of soreness inthe bicep / shoulder. utilized MHP and E-stim to calm down soreness and tension in bicep. Post session she reported 2/10 pain.    PT Next Visit Plan Continue with [facility] protocol, patient now 6 weeks post-op. Assess pain and modifications to HEP. Assess response to isometrics.    PT Home Exercise Plan progress per protocol, possible addition of gently isometrics if appropriate   Consulted and Agree with Plan of Care Patient        Problem List Patient Active Problem List   Diagnosis Date Noted  . Complete rotator cuff rupture of left shoulder 09/21/2014  . Abdominal pain, epigastric 08/24/2013  . Belching 08/24/2013  . Other abnormal blood chemistry 01/21/2012  . Nonspecific (abnormal) findings on radiological and other examination of biliary tract 01/21/2012  . TIA (transient ischemic attack) 11/05/2011  . PVC's (premature ventricular contractions) 10/11/2011  . Nonspecific abnormal unspecified cardiovascular function study 12/20/2010  . HYPERTENSION, BENIGN 01/20/2009  . Malignant neoplasm of female breast ([place]) 08/23/2008  . HEMORRHOIDS 08/23/2008  . CONSTIPATION, CHRONIC 08/23/2008  . RECTAL BLEEDING 08/23/2008  . Malignant neoplasm of colon ([place]) 01/26/2005   Starr Lake PT, DPT, LAT, ATC  09/19/2015  3:10 PM     [facility] [address] [place], [place], [address] Phone: [phone]   Fax:  [phone]  Name: Eileen [name] MRN: [id number] Date of Birth: [birth year]

## 2015-09-21 ENCOUNTER — Ambulatory Visit: Payer: Medicare Other | Admitting: Physical Therapy

## 2015-09-21 DIAGNOSIS — M25511 Pain in right shoulder: Secondary | ICD-10-CM

## 2015-09-21 DIAGNOSIS — R29898 Other symptoms and signs involving the musculoskeletal system: Secondary | ICD-10-CM | POA: Diagnosis not present

## 2015-09-21 DIAGNOSIS — R6889 Other general symptoms and signs: Secondary | ICD-10-CM | POA: Diagnosis not present

## 2015-09-21 DIAGNOSIS — R293 Abnormal posture: Secondary | ICD-10-CM

## 2015-09-21 DIAGNOSIS — M25611 Stiffness of right shoulder, not elsewhere classified: Secondary | ICD-10-CM

## 2015-09-21 NOTE — Therapy (Signed)
[place], [place], [address] Phone: [phone]   Fax:  [phone]  Physical Therapy Treatment  Patient Details  Name: Eileen [name] MRN: [id number] Date of Birth: [birth year] Referring Provider: Ermalene Postin MD  Encounter Date: 09/21/2015      PT End of Session - 09/21/15 1054    Visit Number 13   Number of Visits 20   Authorization Type Medicare kx modifier 15th visit    PT Start Time 1017   PT Stop Time 1108   PT Time Calculation (min) 51 min   Activity Tolerance Patient tolerated treatment well   Behavior During Therapy [facility] for tasks assessed/performed      Past Medical History  Diagnosis Date  . HEMORRHOIDS   . Hypertension   . Anemia   . Axillary mass 7/99    Left  . Cervical spinal stenosis   . Headache(784.0)   . Blood transfusion without reported diagnosis   . CARCINOMA, BREAST 1995    left  . COLON CANCER 2006    T3 N0 tumor  . Barrett's esophagus     Past Surgical History  Procedure Laterality Date  . Bone marrow transplant  1999  . Total abdominal hysterectomy w/ bilateral salpingoophorectomy  1996  . Hemicolectomy  01/2005    right  . Breast lumpectomy  11/95    left breast  . Inguinal hernia repair    . Ercp  01/21/2012    Procedure: ENDOSCOPIC RETROGRADE CHOLANGIOPANCREATOGRAPHY (ERCP);  Surgeon: Lafayette Dragon, MD;  Location: [facility];  Service: Endoscopy;  Laterality: [name];  . Neck mass excision Left 7/99  . Hysteroscopy  08/30/94  . Tunneled venous catheter placement  9/98  . Bunionectomy  3/09, 10/09    right and left  . Rotator cuff repair Right 09/08/13    full tear  . Colon surgery    . Colonoscopy      There were no vitals filed for this visit.  Visit Diagnosis:  Abnormal posture  Right shoulder pain  Weakness of right arm  Decreased right shoulder range of motion  Activity intolerance      Subjective Assessment - 09/21/15 1018    Subjective "I am feeling better since the last visit"   Currently in Pain? No/denies   Pain Location Shoulder   Pain Orientation Right                         [facility] Adult PT Treatment/Exercise - 09/21/15 1028    Shoulder Exercises: ROM/Strengthening   UBE (Upper Arm Bike) Nu-Step L4 x 5 min  using both UE/LE   Other ROM/Strengthening Exercises Wall ladder flexion x 3 with eccentric lower on wall,    Shoulder Exercises: Isometric Strengthening   Flexion --  10 x 5 sec hold    Flexion Limitations cues to rest and activate gently, and not move the body   Extension --  10 x 5 sec hold   External Rotation --  10 x 5 sec hold   Internal Rotation --  10 x 5 sec hold   Moist Heat Therapy   Number Minutes Moist Heat 10 Minutes   Moist Heat Location Shoulder   Electrical Stimulation   Electrical Stimulation Location R shoulder   Electrical Stimulation Action IFC   Electrical Stimulation Parameters 100% scan, 80-150 frequency, L 16, x 10 min   Electrical Stimulation Goals Pain   Manual Therapy   Joint Mobilization  grade 2 inferior joint mobs with distraction oscillations  93 degrees   Passive ROM flexion/ abduction with gentle oscillations pushing to end range then holding with oscillation until pain resolves and pushing farther   123 degrees of flexion,                 PT Education - 09/21/15 1054    Education provided Yes   Education Details updated HEP   Person(s) Educated Patient   Methods Explanation   Comprehension Verbalized understanding          PT Short Term Goals - 09/21/15 1101    PT SHORT TERM GOAL #1   Title pt will be I with initial HEP (09/08/2015)   Time 4   Period Weeks   Status Achieved   PT SHORT TERM GOAL #2   Title pt will improve her R shoulder AROM by >/=10 degrees of flexion/abduction to assist with functional progression (09/08/2015)   Time 4   Period Weeks   Status On-going   PT SHORT TERM GOAL #3   Title pt will be  able to verbalize and demonstrate techniques to control and reduce R shoulder inflammation and edema via RICE and HEP (09/08/2015)   Time 4   Period Weeks   Status Achieved   PT SHORT TERM GOAL #4   Title pt will improve her FOTO score by >/= 10 points to demonstrate improving function (09/08/2015)   Time 4   Period Weeks   Status On-going           PT Long Term Goals - 09/21/15 1101    PT LONG TERM GOAL #1   Title pt will be I with all HEP given as of last visit (10/03/2015)   Time 8   Period Weeks   Status On-going   PT LONG TERM GOAL #2   Title pt will increase R shouldre flexion/ abduction to >/= 110 degrees with </=2/10 pain to assist with ADLs (10/03/2015)   Period Weeks   Status On-going   PT LONG TERM GOAL #3   Title pt will demonstrate >/= 4-/5 strength in the r should to assist with lifting and carrying activities with </=2/10 pain (10/03/2015)   Baseline not allowed per protocol at this time.   Time 8   Period Weeks   Status On-going   PT LONG TERM GOAL #4   Title pt will be able to lift and carry >/= 5# at shoulder height or higher with </= 2/10 pain to assist with ADLS ([id number])   Time 8   Period Weeks   Status On-going   PT LONG TERM GOAL #5   Title pt will improve her FOTO score to >/= 60 to demonstrate improved function ( 10/03/2015)   Time 8   Period Weeks   Status On-going               Plan - 09/21/15 1058    Clinical Impression Statement pt reports no pain today. Focused on progressing ROM with manual and AAROM exercise with wall ladder with eccentric lowering. updated HEP to include isomtric strengthening which she performed well here with cues with report of feeling some soreness and tingling in the hand. performed heat and E-stim following todays session to decrease soreness.    PT Next Visit Plan Continue with [facility] protocol, patient now 6 weeks post-op. strengthening, AAROM and AROM, gengtle strengthening, Measure AAROM/ AROM   PT Home Exercise  Plan isometric flexion/ extension, IR/ER   Consulted  and Agree with Plan of Care Patient        Problem List Patient Active Problem List   Diagnosis Date Noted  . Complete rotator cuff rupture of left shoulder 09/21/2014  . Abdominal pain, epigastric 08/24/2013  . Belching 08/24/2013  . Other abnormal blood chemistry 01/21/2012  . Nonspecific (abnormal) findings on radiological and other examination of biliary tract 01/21/2012  . TIA (transient ischemic attack) 11/05/2011  . PVC's (premature ventricular contractions) 10/11/2011  . Nonspecific abnormal unspecified cardiovascular function study 12/20/2010  . HYPERTENSION, BENIGN 01/20/2009  . Malignant neoplasm of female breast ([place]) 08/23/2008  . HEMORRHOIDS 08/23/2008  . CONSTIPATION, CHRONIC 08/23/2008  . RECTAL BLEEDING 08/23/2008  . Malignant neoplasm of colon ([place]) 01/26/2005   Starr Lake PT, DPT, LAT, ATC  09/21/2015  11:08 AM     [facility] [address] [place], [place], [address] Phone: [phone]   Fax:  [phone]  Name: Keviona Kennebeck MRN: [id number] Date of Birth: 06-07-1946

## 2015-09-26 ENCOUNTER — Ambulatory Visit: Payer: Medicare Other | Admitting: Physical Therapy

## 2015-09-28 ENCOUNTER — Encounter: Payer: Medicare Other | Admitting: Physical Therapy

## 2015-10-03 ENCOUNTER — Encounter: Payer: Medicare Other | Admitting: Physical Therapy

## 2015-10-03 ENCOUNTER — Ambulatory Visit
Admission: RE | Admit: 2015-10-03 | Discharge: 2015-10-03 | Disposition: A | Discharge status: Home / Self Care | Payer: Medicare Other | Source: Ambulatory Visit

## 2015-10-03 ENCOUNTER — Ambulatory Visit: Payer: Medicare Other | Attending: Orthopaedic Surgery | Admitting: Physical Therapy

## 2015-10-03 DIAGNOSIS — M25511 Pain in right shoulder: Secondary | ICD-10-CM | POA: Insufficient documentation

## 2015-10-03 DIAGNOSIS — Z1231 Encounter for screening mammogram for malignant neoplasm of breast: Secondary | ICD-10-CM

## 2015-10-03 DIAGNOSIS — R293 Abnormal posture: Secondary | ICD-10-CM

## 2015-10-03 DIAGNOSIS — R6889 Other general symptoms and signs: Secondary | ICD-10-CM | POA: Insufficient documentation

## 2015-10-03 DIAGNOSIS — R29898 Other symptoms and signs involving the musculoskeletal system: Secondary | ICD-10-CM

## 2015-10-03 DIAGNOSIS — M25611 Stiffness of right shoulder, not elsewhere classified: Secondary | ICD-10-CM

## 2015-10-03 NOTE — Patient Instructions (Signed)
SHOULDER: External Rotation - Supine (Cane)   Hold cane with both hands. Rotate arm away from body. Keep elbow on floor and next to body. _10__ reps per set, _2__ sets per day, _7__ days per week Add towel to keep elbow at side.  Copyright  VHI. All rights reserved.  Cane Horizontal - Supine   With straight arms holding cane above shoulders, bring cane out to right, center, out to left, and back to above head. Repeat __10_ times. Do __2_ times per day.  Copyright  VHI. All rights reserved.  Cane Exercise: Flexion   Lie on back, holding cane above chest. Keeping arms as straight as possible, lower cane toward floor beyond head. Hold __5__ seconds. Repeat __10__ times. Do _2___ sessions per day.  http://gt2.exer.us/91   Copyright  VHI. All rights reserved.   

## 2015-10-03 NOTE — Therapy (Signed)
[place], [place], [address] Phone: [phone]   Fax:  [phone]  Physical Therapy Treatment  Patient Details  Name: Eileen [name] MRN: [id number] Date of Birth: 07-29-1946 Referring Provider: Ermalene Postin MD  Encounter Date: 10/03/2015      PT End of Session - 10/03/15 0943    Visit Number 14   Number of Visits 20   Date for PT Re-Evaluation 10/03/15   Authorization Type Medicare kx modifier 15th visit    PT Start Time 0933   PT Stop Time 1012   PT Time Calculation (min) 39 min      Past Medical History  Diagnosis Date  . HEMORRHOIDS   . Hypertension   . Anemia   . Axillary mass 7/99    Left  . Cervical spinal stenosis   . Headache(784.0)   . Blood transfusion without reported diagnosis   . CARCINOMA, BREAST 1995    left  . COLON CANCER 2006    T3 N0 tumor  . Barrett's esophagus     Past Surgical History  Procedure Laterality Date  . Bone marrow transplant  1999  . Total abdominal hysterectomy w/ bilateral salpingoophorectomy  1996  . Hemicolectomy  01/2005    right  . Breast lumpectomy  11/95    left breast  . Inguinal hernia repair    . Ercp  01/21/2012    Procedure: ENDOSCOPIC RETROGRADE CHOLANGIOPANCREATOGRAPHY (ERCP);  Surgeon: Lafayette Dragon, MD;  Location: [facility];  Service: Endoscopy;  Laterality: [name];  . Neck mass excision Left 7/99  . Hysteroscopy  08/30/94  . Tunneled venous catheter placement  9/98  . Bunionectomy  3/09, 10/09    right and left  . Rotator cuff repair Right 09/08/13    full tear  . Colon surgery    . Colonoscopy      There were no vitals filed for this visit.  Visit Diagnosis:  Abnormal posture  Right shoulder pain  Weakness of right arm  Decreased right shoulder range of motion      Subjective Assessment - 10/03/15 0943    Subjective Only pain when I try to use it.    Currently in Pain? No/denies            [facility] PT  Assessment - 10/03/15 0001    AROM   Right/Left Shoulder Right;Left   Right Shoulder Flexion 85 Degrees   Right Shoulder ABduction 65 Degrees   Right Shoulder Internal Rotation --  reach to L1   Right Shoulder External Rotation --  reach to back of neck                     [facility] Adult PT Treatment/Exercise - 10/03/15 0001    Shoulder Exercises: Supine   Protraction 10 reps;AAROM   Protraction Limitations supine with cane- max cues for scapula   External Rotation Limitations gentle endrange stretch   Flexion Limitations gentle end range stretch   Other Supine Exercises supine cane chest press, pulloever, ER, Horizontal abduction   Shoulder Exercises: ROM/Strengthening   UBE (Upper Arm Bike) Nu-Step L4 x 7 min  using both UE/LE   Shoulder Exercises: Isometric Strengthening   Flexion --  10 x 5 sec hold    Flexion Limitations cues to rest and activate gently, and not move the body   Extension --  10 x 5 sec hold   External Rotation --  10 x 5 sec hold  Internal Rotation --  10 x 5 sec hold                PT Education - 10/03/15 1003    Education provided Yes   Education Details Supine cane   Person(s) Educated Patient   Methods Explanation;Handout   Comprehension Verbalized understanding          PT Short Term Goals - 09/21/15 1101    PT SHORT TERM GOAL #1   Title pt will be I with initial HEP (09/08/2015)   Time 4   Period Weeks   Status Achieved   PT SHORT TERM GOAL #2   Title pt will improve her R shoulder AROM by >/=10 degrees of flexion/abduction to assist with functional progression (09/08/2015)   Time 4   Period Weeks   Status On-going   PT SHORT TERM GOAL #3   Title pt will be able to verbalize and demonstrate techniques to control and reduce R shoulder inflammation and edema via RICE and HEP (09/08/2015)   Time 4   Period Weeks   Status Achieved   PT SHORT TERM GOAL #4   Title pt will improve her FOTO score by >/= 10 points to  demonstrate improving function (09/08/2015)   Time 4   Period Weeks   Status On-going           PT Long Term Goals - 09/21/15 1101    PT LONG TERM GOAL #1   Title pt will be I with all HEP given as of last visit (10/03/2015)   Time 8   Period Weeks   Status On-going   PT LONG TERM GOAL #2   Title pt will increase R shouldre flexion/ abduction to >/= 110 degrees with </=2/10 pain to assist with ADLs (10/03/2015)   Period Weeks   Status On-going   PT LONG TERM GOAL #3   Title pt will demonstrate >/= 4-/5 strength in the r should to assist with lifting and carrying activities with </=2/10 pain (10/03/2015)   Baseline not allowed per protocol at this time.   Time 8   Period Weeks   Status On-going   PT LONG TERM GOAL #4   Title pt will be able to lift and carry >/= 5# at shoulder height or higher with </= 2/10 pain to assist with ADLS ([id number])   Time 8   Period Weeks   Status On-going   PT LONG TERM GOAL #5   Title pt will improve her FOTO score to >/= 60 to demonstrate improved function ( 10/03/2015)   Time 8   Period Weeks   Status On-going               Plan - 10/03/15 1012    Clinical Impression Statement Review of isometrics with pt requiring verbal cues for positioning shoulder in neutral prior. Instructed pt in supine cane AAROM exercises and added to HEP. Pt declines handout and reports she has already been doing thses exercises. Reminded pt of protocol and how scapular compensations can develop easily. Max cues throughout treatment for scpular positioning to decrease compensations. No increased pain per patient and she declined modalities due to another appointment.    PT Next Visit Plan Continue with [facility] protocol, patient now 9 weeks post-op. strengthening, AAROM and AROM, gengtle strengthening, Measure AAROM/ AROM        Problem List Patient Active Problem List   Diagnosis Date Noted  . Complete rotator cuff rupture of left shoulder 09/21/2014  .  Abdominal  pain, epigastric 08/24/2013  . Belching 08/24/2013  . Other abnormal blood chemistry 01/21/2012  . Nonspecific (abnormal) findings on radiological and other examination of biliary tract 01/21/2012  . TIA (transient ischemic attack) 11/05/2011  . PVC's (premature ventricular contractions) 10/11/2011  . Nonspecific abnormal unspecified cardiovascular function study 12/20/2010  . HYPERTENSION, BENIGN 01/20/2009  . Malignant neoplasm of female breast ([place]) 08/23/2008  . HEMORRHOIDS 08/23/2008  . CONSTIPATION, CHRONIC 08/23/2008  . RECTAL BLEEDING 08/23/2008  . Malignant neoplasm of colon [facility]) 01/26/2005    Dorene Ar, PTA 10/03/2015, 10:19 AM  [facility] [address] [place], [place], [address] Phone: [phone]   Fax:  [phone]  Name: Eileen [name] MRN: [id number] Date of Birth: 1946/05/16

## 2015-10-05 ENCOUNTER — Ambulatory Visit: Payer: Medicare Other | Admitting: Physical Therapy

## 2015-10-05 DIAGNOSIS — R29898 Other symptoms and signs involving the musculoskeletal system: Secondary | ICD-10-CM | POA: Diagnosis not present

## 2015-10-05 DIAGNOSIS — M25511 Pain in right shoulder: Secondary | ICD-10-CM

## 2015-10-05 DIAGNOSIS — M25611 Stiffness of right shoulder, not elsewhere classified: Secondary | ICD-10-CM

## 2015-10-05 DIAGNOSIS — R293 Abnormal posture: Secondary | ICD-10-CM

## 2015-10-05 DIAGNOSIS — R6889 Other general symptoms and signs: Secondary | ICD-10-CM | POA: Diagnosis not present

## 2015-10-05 NOTE — Therapy (Signed)
[place], [place], [address] Phone: [phone]   Fax:  [phone]  Physical Therapy Treatment / Re-certification  Patient Details  Name: Eileen [name] MRN: [id number] Date of Birth: 1945/09/07 Referring Provider: Ermalene Postin MD  Encounter Date: 10/05/2015      PT End of Session - 10/05/15 1150    Visit Number 15   Number of Visits 20   Date for PT Re-Evaluation 11/09/15   PT Start Time 1100   PT Stop Time 1150   PT Time Calculation (min) 50 min   Activity Tolerance Patient tolerated treatment well   Behavior During Therapy [facility] for tasks assessed/performed      Past Medical History  Diagnosis Date  . HEMORRHOIDS   . Hypertension   . Anemia   . Axillary mass 7/99    Left  . Cervical spinal stenosis   . Headache(784.0)   . Blood transfusion without reported diagnosis   . CARCINOMA, BREAST 1995    left  . COLON CANCER 2006    T3 N0 tumor  . Barrett's esophagus     Past Surgical History  Procedure Laterality Date  . Bone marrow transplant  1999  . Total abdominal hysterectomy w/ bilateral salpingoophorectomy  1996  . Hemicolectomy  01/2005    right  . Breast lumpectomy  11/95    left breast  . Inguinal hernia repair    . Ercp  01/21/2012    Procedure: ENDOSCOPIC RETROGRADE CHOLANGIOPANCREATOGRAPHY (ERCP);  Surgeon: Lafayette Dragon, MD;  Location: [facility];  Service: Endoscopy;  Laterality: [name];  . Neck mass excision Left 7/99  . Hysteroscopy  08/30/94  . Tunneled venous catheter placement  9/98  . Bunionectomy  3/09, 10/09    right and left  . Rotator cuff repair Right 09/08/13    full tear  . Colon surgery    . Colonoscopy      There were no vitals filed for this visit.  Visit Diagnosis:  Abnormal posture - Plan: PT plan of care cert/re-cert  Right shoulder pain - Plan: PT plan of care cert/re-cert  Weakness of right arm - Plan: PT plan of care cert/re-cert  Decreased  right shoulder range of motion - Plan: PT plan of care cert/re-cert  Activity intolerance - Plan: PT plan of care cert/re-cert      Subjective Assessment - 10/05/15 1102    Subjective "I am doing well with on pain"    Currently in Pain? No/denies            [facility] PT Assessment - 10/05/15 0001    AROM   Right Shoulder Flexion 98 Degrees   Right Shoulder ABduction 90 Degrees   Right Shoulder Internal Rotation --  T8   Strength   Left Shoulder Flexion 3+/5   Left Shoulder Extension 3+/5   Left Shoulder ABduction 3+/5   Left Shoulder Internal Rotation 3+/5   Left Shoulder External Rotation 3+/5                     [facility] Adult PT Treatment/Exercise - 10/05/15 1104    Shoulder Exercises: Supine   Protraction Limitations supine with cane- max cues for scapula   Other Supine Exercises supine cane chest press, pulloever, ER, Horizontal abduction   Shoulder Exercises: Standing   Flexion AROM;Strengthening;10 reps  x 2 reaching into bottom shelf   Flexion Limitations cues to keep R shoulder down.    Row AROM;Strengthening;10 reps;Theraband;20 reps  Theraband Level (Shoulder Row) Level 1 (Yellow);Level 2 (Red)   Shoulder Exercises: ROM/Strengthening   UBE (Upper Arm Bike) Nu-Step L5 x 7 min  with UE/ LE   Other ROM/Strengthening Exercises Wall ladder flexion / abduction 5 x each   Other ROM/Strengthening Exercises shoulder IR with towel x 10   T8   Manual Therapy   Joint Mobilization grade 3 inferior joint mobs with distraction oscillations   Passive ROM flexion/ abduction with gentle oscillations pushing to end range then holding with oscillation until pain resolves and pushing farther   150 flexion, 118 with abduction                PT Education - 10/05/15 1150    Education provided Yes   Education Details reviewed HEP   Person(s) Educated Patient   Methods Explanation   Comprehension Verbalized understanding          PT Short Term Goals -  10/05/15 1154    PT SHORT TERM GOAL #1   Title pt will be I with initial HEP (09/08/2015)   Time 4   Period Weeks   Status Achieved   PT SHORT TERM GOAL #2   Title pt will improve her R shoulder AROM by >/=10 degrees of flexion/abduction to assist with functional progression (09/08/2015)   Baseline 90 degrees to shoulder heigh shelf   Time 4   Period Weeks   Status On-going   PT SHORT TERM GOAL #3   Title pt will be able to verbalize and demonstrate techniques to control and reduce R shoulder inflammation and edema via RICE and HEP (09/08/2015)   Time 4   Period Weeks   Status Achieved   PT SHORT TERM GOAL #4   Title pt will improve her FOTO score by >/= 10 points to demonstrate improving function (09/08/2015)   Baseline 67 today   Time 4   Period Weeks   Status Achieved           PT Long Term Goals - 10/05/15 1155    PT LONG TERM GOAL #1   Title pt will be I with all HEP given as of last visit (10/03/2015)   Time 8   Period Weeks   Status On-going   PT LONG TERM GOAL #2   Title pt will increase R shouldre flexion/ abduction to >/= 110 degrees with </=2/10 pain to assist with ADLs (10/03/2015)   Time 8   Period Weeks   Status On-going   PT LONG TERM GOAL #3   Title pt will demonstrate >/= 4-/5 strength in the r should to assist with lifting and carrying activities with </=2/10 pain (10/03/2015)   Status On-going   PT LONG TERM GOAL #4   Title pt will be able to lift and carry >/= 5# at shoulder height or higher with </= 2/10 pain to assist with ADLS (11/29/84)   Time 8   Period Weeks   Status On-going   PT LONG TERM GOAL #5   Title pt will improve her FOTO score to >/= 60 to demonstrate improved function ( 10/03/2015)   Baseline 67 FOTO    Time 8   Period Weeks   Status Achieved               Plan - 10/05/15 1150    Clinical Impression Statement Eileen [name] continues to make progress with physical therapy. She is improving her AAROM and progressing with AROM. Focused  todays session on AAROM / AROM into  flexion reaching and strengthening of scapular stablizers. She would benefit from continued physical therapy to progress with mobility and strengthening.  She met all STG's today. Plan to work toward her remaining goals and independent HEP .    Pt will benefit from skilled therapeutic intervention in order to improve on the following deficits Decreased activity tolerance;Decreased endurance;Impaired UE functional use;Decreased strength;Hypomobility;Impaired flexibility;Increased edema;Improper body mechanics;Postural dysfunction;Decreased range of motion   Rehab Potential Good   PT Frequency 1x / week   PT Duration --  5 weeks   PT Treatment/Interventions ADLs/Self Care Home Management;Electrical Stimulation;Iontophoresis 35m/ml Dexamethasone;Moist Heat;Therapeutic exercise;Therapeutic activities;Manual techniques;Vasopneumatic Device;Dry needling;Patient/family education;Cryotherapy;Passive range of motion;Ultrasound   PT Next Visit Plan Continue with [facility] protocol, patient now 10 weeks post-op. strengthening, AAROM and AROM, progress to AROM, scapular strengthening   PT Home Exercise Plan HEP review   Consulted and Agree with Plan of Care Patient          G-Codes - [id number]    Functional Assessment Tool Used FOTO 33% limited   Functional Limitation Carrying, moving and handling objects   Carrying, Moving and Handling Objects Current Status ((J0315 At least 20 percent but less than 40 percent impaired, limited or restricted   Carrying, Moving and Handling Objects Goal Status (([id number] At least 20 percent but less than 40 percent impaired, limited or restricted      Problem List Patient Active Problem List   Diagnosis Date Noted  . Complete rotator cuff rupture of left shoulder 09/21/2014  . Abdominal pain, epigastric 08/24/2013  . Belching 08/24/2013  . Other abnormal blood chemistry 01/21/2012  . Nonspecific (abnormal) findings on radiological  and other examination of biliary tract 01/21/2012  . TIA (transient ischemic attack) 11/05/2011  . PVC's (premature ventricular contractions) 10/11/2011  . Nonspecific abnormal unspecified cardiovascular function study 12/20/2010  . HYPERTENSION, BENIGN 01/20/2009  . Mal[place] 08/23/2008  . HEMORRHOIDS 08/23/2008  . CONSTIPATION, CHRONIC 08/23/2008  . RECTAL BLEEDING 08/23/2008  . Malignant ne[place] of colon ([place] 01/26/2005   KStarr LakePT, DPT, LAT, ATC  0April 05, 2017 12:06 PM     [facility] [address]Phone: [phone]  Fax:  [phone] Name: Eileen HottelMRN: [id number] of Birth: [birth year]

## 2015-10-10 ENCOUNTER — Ambulatory Visit: Payer: Medicare Other | Admitting: Physical Therapy

## 2015-10-10 DIAGNOSIS — R293 Abnormal posture: Secondary | ICD-10-CM

## 2015-10-10 DIAGNOSIS — R29898 Other symptoms and signs involving the musculoskeletal system: Secondary | ICD-10-CM

## 2015-10-10 DIAGNOSIS — M25511 Pain in right shoulder: Secondary | ICD-10-CM

## 2015-10-10 DIAGNOSIS — M25611 Stiffness of right shoulder, not elsewhere classified: Secondary | ICD-10-CM

## 2015-10-10 DIAGNOSIS — R6889 Other general symptoms and signs: Secondary | ICD-10-CM

## 2015-10-10 NOTE — Therapy (Signed)
[place], [place], [address] Phone: [phone]   Fax:  [phone]  Physical Therapy Treatment  Patient Details  Name: Eileen [name] MRN: [id number] Date of Birth: [birth year] Referring Provider: Ermalene Postin MD  Encounter Date: 10/10/2015      PT End of Session - 10/10/15 1304    Visit Number 16   Date for PT Re-Evaluation 11/09/15   PT Start Time 1150   PT Stop Time 1230   PT Time Calculation (min) 40 min   Activity Tolerance Patient tolerated treatment well   Behavior During Therapy [facility] for tasks assessed/performed      Past Medical History  Diagnosis Date  . HEMORRHOIDS   . Hypertension   . Anemia   . Axillary mass 7/99    Left  . Cervical spinal stenosis   . Headache(784.0)   . Blood transfusion without reported diagnosis   . CARCINOMA, BREAST 1995    left  . COLON CANCER 2006    T3 N0 tumor  . Barrett's esophagus     Past Surgical History  Procedure Laterality Date  . Bone marrow transplant  1999  . Total abdominal hysterectomy w/ bilateral salpingoophorectomy  1996  . Hemicolectomy  01/2005    right  . Breast lumpectomy  11/95    left breast  . Inguinal hernia repair    . Ercp  01/21/2012    Procedure: ENDOSCOPIC RETROGRADE CHOLANGIOPANCREATOGRAPHY (ERCP);  Surgeon: Lafayette Dragon, MD;  Location: [facility];  Service: Endoscopy;  Laterality: [name];  . Neck mass excision Left 7/99  . Hysteroscopy  08/30/94  . Tunneled venous catheter placement  9/98  . Bunionectomy  3/09, 10/09    right and left  . Rotator cuff repair Right 09/08/13    full tear  . Colon surgery    . Colonoscopy      There were no vitals filed for this visit.  Visit Diagnosis:  Abnormal posture  Right shoulder pain  Weakness of right arm  Decreased right shoulder range of motion  Activity intolerance      Subjective Assessment - 10/10/15 1154    Subjective Exercises 2 x a day and goes to the  gym 5 days a week.  She uses 3 LBS  for biceps at the gym.    Currently in Pain? No/denies                         [facility] Adult PT Treatment/Exercise - 10/10/15 1155    Shoulder Exercises: Supine   Other Supine Exercises rythemic stabilization2 bouts at 90 degrees and 1 bout a little more flexion,  First practiced with LT   Shoulder Exercises: Seated   Other Seated Exercises seated AROM 10 X flexion, Abduction,  ER each   Other Seated Exercises AAROM  flexion Abduction ER/IR with intermittant soft tissue work peri scapular   Shoulder Exercises: Sidelying   External Rotation 10 reps  2 sets using towel roll   Flexion 10 reps  with manual scapular guidance   Other Sidelying Exercises 2 sets 10 manual resistance scapular retraction/protraction.     Shoulder Exercises: ROM/Strengthening   UBE (Upper Arm Bike) Nu-Step L 5 7 minutes  ARM bike 5 minutes no resistance forward   Manual Therapy   Manual Therapy Soft tissue mobilization;Passive ROM   Soft tissue mobilization soft tissue work peri scapular sitting and sidelying  PT Education - 10/10/15 1303    Education provided Yes   Education Details how shoulder muscles work together   Methods Demonstration   Comprehension Verbalized understanding          PT Short Term Goals - 10/05/15 1154    PT SHORT TERM GOAL #1   Title pt will be I with initial HEP (09/08/2015)   Time 4   Period Weeks   Status Achieved   PT SHORT TERM GOAL #2   Title pt will improve her R shoulder AROM by >/=10 degrees of flexion/abduction to assist with functional progression (09/08/2015)   Baseline 90 degrees to shoulder heigh shelf   Time 4   Period Weeks   Status On-going   PT SHORT TERM GOAL #3   Title pt will be able to verbalize and demonstrate techniques to control and reduce R shoulder inflammation and edema via RICE and HEP (09/08/2015)   Time 4   Period Weeks   Status Achieved   PT SHORT TERM GOAL #4    Title pt will improve her FOTO score by >/= 10 points to demonstrate improving function (09/08/2015)   Baseline 67 today   Time 4   Period Weeks   Status Achieved           PT Long Term Goals - 10/10/15 1307    PT LONG TERM GOAL #1   Title pt will be I with all HEP given as of last visit (10/03/2015)   Time 8   Period Weeks   PT LONG TERM GOAL #2   Title pt will increase R shouldre flexion/ abduction to >/= 110 degrees with </=2/10 pain to assist with ADLs (10/03/2015)   Time 8   Period Weeks   Status On-going   PT LONG TERM GOAL #3   Title pt will demonstrate >/= 4-/5 strength in the r should to assist with lifting and carrying activities with </=2/10 pain (10/03/2015)   Time 8   Period Weeks   Status On-going   PT LONG TERM GOAL #4   Title 8   Status On-going   PT LONG TERM GOAL #5   Title pt will improve her FOTO score to >/= 60 to demonstrate improved function ( 10/03/2015)   Time 8   Period Weeks   Status Achieved               Plan - 10/10/15 1304    Clinical Impression Statement Leala plane on leaving the country next month to see her Granddaughter.  She does not plan on picking her up.  120 degrees AROM flexion  RT.  No pain post session, just soreness. .  She declined the need of heat or Ice. 10 weeks post op tomorrow.  She is adherent with her home exercisees.  No new goals nmet   PT Next Visit Plan Continue with [facility] protocol, patient now 10 weeks post-op. strengthening, AAROM and AROM, progress to AROM, scapular strengthening   PT Home Exercise Plan Continue   Consulted and Agree with Plan of Care Patient        Problem List Patient Active Problem List   Diagnosis Date Noted  . Complete rotator cuff rupture of left shoulder 09/21/2014  . Abdominal pain, epigastric 08/24/2013  . Belching 08/24/2013  . Other abnormal blood chemistry 01/21/2012  . Nonspecific (abnormal) findings on radiological and other examination of biliary tract 01/21/2012  . TIA  (transient ischemic attack) 11/05/2011  . PVC's (premature ventricular contractions) 10/11/2011  .  Nonspecific abnormal unspecified cardiovascular function study 12/20/2010  . HYPERTENSION, BENIGN 01/20/2009  . Malignant neoplasm of female breast ([place]) 08/23/2008  . HEMORRHOIDS 08/23/2008  . CONSTIPATION, CHRONIC 08/23/2008  . RECTAL BLEEDING 08/23/2008  . Malignant neoplasm of colon ([place]) 01/26/2005    Jabree Pernice 10/10/2015, 1:12 PM  [facility] [address] [place], [place], [address] Phone: [phone]   Fax:  [phone]  Name: Jeanae Oehler MRN: [id number] Date of Birth: 1945/12/03    Melvenia Needles, PTA 10/10/2015 1:12 PM Phone: [phone] Fax: [phone]

## 2015-10-16 ENCOUNTER — Ambulatory Visit: Payer: Medicare Other | Admitting: Physical Therapy

## 2015-10-16 DIAGNOSIS — M25511 Pain in right shoulder: Secondary | ICD-10-CM | POA: Diagnosis not present

## 2015-10-16 DIAGNOSIS — M25611 Stiffness of right shoulder, not elsewhere classified: Secondary | ICD-10-CM

## 2015-10-16 DIAGNOSIS — R29898 Other symptoms and signs involving the musculoskeletal system: Secondary | ICD-10-CM | POA: Diagnosis not present

## 2015-10-16 DIAGNOSIS — R6889 Other general symptoms and signs: Secondary | ICD-10-CM | POA: Diagnosis not present

## 2015-10-16 DIAGNOSIS — R293 Abnormal posture: Secondary | ICD-10-CM | POA: Diagnosis not present

## 2015-10-16 NOTE — Therapy (Signed)
[place], [place], [address] Phone: [phone]   Fax:  [phone]  Physical Therapy Treatment  Patient Details  Name: Eileen [name] MRN: [id number] Date of Birth: 1945-12-05 Referring Provider: Ermalene Postin MD  Encounter Date: 10/16/2015      PT End of Session - 10/16/15 1623    Visit Number 17   Number of Visits 20   Date for PT Re-Evaluation 11/09/15   PT Start Time 2952   PT Stop Time 1415   PT Time Calculation (min) 42 min   Activity Tolerance Patient tolerated treatment well   Behavior During Therapy [facility] for tasks assessed/performed      Past Medical History  Diagnosis Date  . HEMORRHOIDS   . Hypertension   . Anemia   . Axillary mass 7/99    Left  . Cervical spinal stenosis   . Headache(784.0)   . Blood transfusion without reported diagnosis   . CARCINOMA, BREAST 1995    left  . COLON CANCER 2006    T3 N0 tumor  . Barrett's esophagus     Past Surgical History  Procedure Laterality Date  . Bone marrow transplant  1999  . Total abdominal hysterectomy w/ bilateral salpingoophorectomy  1996  . Hemicolectomy  01/2005    right  . Breast lumpectomy  11/95    left breast  . Inguinal hernia repair    . Ercp  01/21/2012    Procedure: ENDOSCOPIC RETROGRADE CHOLANGIOPANCREATOGRAPHY (ERCP);  Surgeon: Lafayette Dragon, MD;  Location: [facility];  Service: Endoscopy;  Laterality: [name];  . Neck mass excision Left 7/99  . Hysteroscopy  08/30/94  . Tunneled venous catheter placement  9/98  . Bunionectomy  3/09, 10/09    right and left  . Rotator cuff repair Right 09/08/13    full tear  . Colon surgery    . Colonoscopy      There were no vitals filed for this visit.  Visit Diagnosis:  Abnormal posture  Weakness of right arm  Decreased right shoulder range of motion  Activity intolerance  Right shoulder pain      Subjective Assessment - 10/16/15 1338    Subjective 10 weeks post  op.  No pain at rest.     Currently in Pain? No/denies   Pain Location Shoulder   Pain Orientation Right;Anterior;Distal   Pain Descriptors / Indicators Aching   Pain Frequency Occasional   Aggravating Factors  moving arm in certain positions.  Brief pain             [facility] PT Assessment - 10/16/15 0001    AROM   Right Shoulder Flexion 118 Degrees   Right Shoulder ABduction 100 Degrees   Right Shoulder Internal Rotation --  can reach back pocket   Right Shoulder External Rotation 75 Degrees  humerus neutral                     [facility] Adult PT Treatment/Exercise - 10/16/15 1343    Shoulder Exercises: Supine   Flexion AROM  ROM gradually improves   Other Supine Exercises rythemic stabilization2 bouts at 90 degrees and 1 bout a little more flexion,     Other Supine Exercises serratus punches AA  triceps with 2 LBS,  elbow supported by PTA, 10 X   Shoulder Exercises: Seated   Other Seated Exercises seated AROM 10 X flexion, Abduction,  ER each   Other Seated Exercises AAROM  flexion Abduction ER/IR with  intermittant soft tissue work peri scapular   Shoulder Exercises: Sidelying   External Rotation 10 reps  using towel roll   Flexion 10 reps   ABduction 10 reps   ABduction Limitations 140 degrees   Other Sidelying Exercises Manually resisted scapular retraction   Shoulder Exercises: Standing   Other Standing Exercises Shoulder flexion with and without UE Ranger on the wall.  Scapular substitutions improving  tho still present.     Shoulder Exercises: ROM/Strengthening   UBE (Upper Arm Bike) L6 Nu-step 7 minutes  pain free   Shoulder Exercises: Isometric Strengthening   Extension --  10 X 5 seconds   Manual Therapy   Joint Mobilization grade 3 glenohumeral mobilization,  inferior                   PT Short Term Goals - 10/16/15 1635    PT SHORT TERM GOAL #1   Title pt will be I with initial HEP (09/08/2015)   Time 4   Period Weeks   Status  Achieved   PT SHORT TERM GOAL #2   Title pt will improve her R shoulder AROM by >/=10 degrees of flexion/abduction to assist with functional progression (09/08/2015)   Baseline 118 flexion, 118 abduction   Time 4   Status Achieved   PT SHORT TERM GOAL #3   Title pt will be able to verbalize and demonstrate techniques to control and reduce R shoulder inflammation and edema via RICE and HEP (09/08/2015)   Baseline she understands, does not need.   Time 4   Period Weeks   Status Achieved   PT SHORT TERM GOAL #4   Title pt will improve her FOTO score by >/= 10 points to demonstrate improving function (09/08/2015)   Time 4   Period Weeks   Status Achieved           PT Long Term Goals - 10/16/15 1637    PT LONG TERM GOAL #1   Title pt will be I with all HEP given as of last visit (10/03/2015)   Time 8   Period Weeks   Status On-going   PT LONG TERM GOAL #2   Title pt will increase R shouldre flexion/ abduction to >/= 110 degrees with </=2/10 pain to assist with ADLs (10/03/2015)   Baseline 118 flexion, 100 abduction   Time 8   Period Weeks   Status Partially Met   PT LONG TERM GOAL #3   Title pt will demonstrate >/= 4-/5 strength in the r should to assist with lifting and carrying activities with </=2/10 pain (10/03/2015)   Baseline not allowed per protocol at this time.   Time 8   Period Weeks   Status Unable to assess   PT LONG TERM GOAL #4   Title Patient will be able to lift and carry >/= 5 LBS at shoulder height or higher with </= 2/10 pain to assist with ADL's.    Baseline not allowed per protocol at this time   Time 8   Period Weeks   Status On-going   PT LONG TERM GOAL #5   Title pt will improve her FOTO score to >/= 60 to demonstrate improved function ( 10/03/2015)   Time 8   Period Weeks   Status Achieved               Plan - 10/16/15 1623    Clinical Impression Statement ROM Gradually improving Good mechanics for elevation not yet attained in standing  as they  are in sidelying, for AROM.  UE Ranger helpful with mechanics.    PT Next Visit Plan See what MD says,  Continue with [facility] protocol.     PT Home Exercise Plan continue.  Protocol   Consulted and Agree with Plan of Care Patient        Problem List Patient Active Problem List   Diagnosis Date Noted  . Complete rotator cuff rupture of left shoulder 09/21/2014  . Abdominal pain, epigastric 08/24/2013  . Belching 08/24/2013  . Other abnormal blood chemistry 01/21/2012  . Nonspecific (abnormal) findings on radiological and other examination of biliary tract 01/21/2012  . TIA (transient ischemic attack) 11/05/2011  . PVC's (premature ventricular contractions) 10/11/2011  . Nonspecific abnormal unspecified cardiovascular function study 12/20/2010  . HYPERTENSION, BENIGN 01/20/2009  . Malignant neoplasm of female breast ([place]) 08/23/2008  . HEMORRHOIDS 08/23/2008  . CONSTIPATION, CHRONIC 08/23/2008  . RECTAL BLEEDING 08/23/2008  . Malignant neoplasm of colon [facility]) 01/26/2005    Eileen [name] 10/16/2015, 4:43 PM  [facility] [address] [place], [place], [address] Phone: [phone]   Fax:  [phone]  Name: Eileen [name] MRN: [id number] Date of Birth: [birth year]    Melvenia Needles, PTA 10/16/2015 4:43 PM Phone: [phone] Fax: [phone]

## 2015-10-19 DIAGNOSIS — Z9484 Stem cells transplant status: Secondary | ICD-10-CM | POA: Diagnosis not present

## 2015-10-19 DIAGNOSIS — Z85038 Personal history of other malignant neoplasm of large intestine: Secondary | ICD-10-CM | POA: Diagnosis not present

## 2015-10-19 DIAGNOSIS — Z853 Personal history of malignant neoplasm of breast: Secondary | ICD-10-CM | POA: Diagnosis not present

## 2015-10-19 DIAGNOSIS — Z8719 Personal history of other diseases of the digestive system: Secondary | ICD-10-CM | POA: Diagnosis not present

## 2015-10-24 ENCOUNTER — Ambulatory Visit: Payer: Medicare Other | Admitting: Physical Therapy

## 2015-10-24 DIAGNOSIS — R293 Abnormal posture: Secondary | ICD-10-CM | POA: Diagnosis not present

## 2015-10-24 DIAGNOSIS — R29898 Other symptoms and signs involving the musculoskeletal system: Secondary | ICD-10-CM | POA: Diagnosis not present

## 2015-10-24 DIAGNOSIS — M25511 Pain in right shoulder: Secondary | ICD-10-CM

## 2015-10-24 DIAGNOSIS — R6889 Other general symptoms and signs: Secondary | ICD-10-CM | POA: Diagnosis not present

## 2015-10-24 DIAGNOSIS — M25611 Stiffness of right shoulder, not elsewhere classified: Secondary | ICD-10-CM

## 2015-10-24 NOTE — Therapy (Signed)
[place], [place], [address] Phone: [phone]   Fax:  [phone]  Physical Therapy Treatment  Patient Details  Name: Eileen [name] MRN: [id number] Date of Birth: 19-Jan-1946 Referring Provider: Ermalene Postin MD  Encounter Date: 10/24/2015      PT End of Session - 10/24/15 1157    Visit Number 18   Number of Visits 20   Date for PT Re-Evaluation 11/09/15   Authorization Type Medicare kx modifier 15th visit    PT Start Time 4709   PT Stop Time 1231   PT Time Calculation (min) 38 min   Activity Tolerance Patient tolerated treatment well   Behavior During Therapy [facility] for tasks assessed/performed      Past Medical History  Diagnosis Date  . HEMORRHOIDS   . Hypertension   . Anemia   . Axillary mass 7/99    Left  . Cervical spinal stenosis   . Headache(784.0)   . Blood transfusion without reported diagnosis   . CARCINOMA, BREAST 1995    left  . COLON CANCER 2006    T3 N0 tumor  . Barrett's esophagus     Past Surgical History  Procedure Laterality Date  . Bone marrow transplant  1999  . Total abdominal hysterectomy w/ bilateral salpingoophorectomy  1996  . Hemicolectomy  01/2005    right  . Breast lumpectomy  11/95    left breast  . Inguinal hernia repair    . Ercp  01/21/2012    Procedure: ENDOSCOPIC RETROGRADE CHOLANGIOPANCREATOGRAPHY (ERCP);  Surgeon: Lafayette Dragon, MD;  Location: [facility];  Service: Endoscopy;  Laterality: [name];  . Neck mass excision Left 7/99  . Hysteroscopy  08/30/94  . Tunneled venous catheter placement  9/98  . Bunionectomy  3/09, 10/09    right and left  . Rotator cuff repair Right 09/08/13    full tear  . Colon surgery    . Colonoscopy      There were no vitals filed for this visit.  Visit Diagnosis:  Abnormal posture  Weakness of right arm  Decreased right shoulder range of motion  Activity intolerance  Right shoulder pain      Subjective  Assessment - 10/24/15 1156    Subjective "I feel like I am doing good" pt reports seeing her physician and that everything is going well and that she was discharged from his care.    Currently in Pain? No/denies            [facility] PT Assessment - 10/24/15 0001    Observation/Other Assessments   Focus on Therapeutic Outcomes (FOTO)  40% limited   AROM   Right Shoulder Flexion 125 Degrees   Right Shoulder ABduction 115 Degrees   Right Shoulder Internal Rotation 60 Degrees   Right Shoulder External Rotation 70 Degrees   Strength   Left Shoulder Flexion 3+/5   Left Shoulder Extension 3+/5   Left Shoulder ABduction 3+/5   Left Shoulder Internal Rotation 3+/5   Left Shoulder External Rotation 3+/5                     [facility] Adult PT Treatment/Exercise - 10/24/15 1236    Self-Care   Self-Care Lifting   Lifting being carefull with traveling luggage and to perhaps take a test run seeing what she is comfortable toting around. Loading up her luggage and perhaps going to the mall and seeing how she does to give it a real world test  before she travels.    Other Self-Care Comments  to take her time when traveling to avoid in potential mishaps. to progress strengthening by increasing reps/ sets to continuing making progress.    Shoulder Exercises: Supine   ABduction AAROM;Both;10 reps  with wand   Other Supine Exercises flexion x 15 with wand   Shoulder Exercises: ROM/Strengthening   UBE (Upper Arm Bike) L2 x 2 min  pt reported already doing at gym today so halted exercise                PT Education - 10/24/15 1236    Education provided Yes   Education Details HEP review.    Person(s) Educated Patient   Methods Explanation   Comprehension Verbalized understanding          PT Short Term Goals - 10/16/15 1635    PT SHORT TERM GOAL #1   Title pt will be I with initial HEP (09/08/2015)   Time 4   Period Weeks   Status Achieved   PT SHORT TERM GOAL #2   Title  pt will improve her R shoulder AROM by >/=10 degrees of flexion/abduction to assist with functional progression (09/08/2015)   Baseline 118 flexion, 118 abduction   Time 4   Status Achieved   PT SHORT TERM GOAL #3   Title pt will be able to verbalize and demonstrate techniques to control and reduce R shoulder inflammation and edema via RICE and HEP (09/08/2015)   Baseline she understands, does not need.   Time 4   Period Weeks   Status Achieved   PT SHORT TERM GOAL #4   Title pt will improve her FOTO score by >/= 10 points to demonstrate improving function (09/08/2015)   Time 4   Period Weeks   Status Achieved           PT Long Term Goals - 10/24/15 1211    PT LONG TERM GOAL #1   Title pt will be I with all HEP given as of last visit (10/03/2015)   Time 8   Period Weeks   Status Achieved   PT LONG TERM GOAL #2   Title pt will increase R shouldre flexion/ abduction to >/= 110 degrees with </=2/10 pain to assist with ADLs (10/03/2015)   Time 8   Period Weeks   Status Achieved   PT LONG TERM GOAL #3   Title pt will demonstrate >/= 4-/5 strength in the r should to assist with lifting and carrying activities with </=2/10 pain (10/03/2015)   Time 8   Period Weeks   Status Partially Met   PT LONG TERM GOAL #4   Title Patient will be able to lift and carry >/= 5 LBS at shoulder height or higher with </= 2/10 pain to assist with ADL's.    Period Weeks   Status Partially Met   PT LONG TERM GOAL #5   Title pt will improve her FOTO score to >/= 60 to demonstrate improved function ( 10/03/2015)   Time 8   Period Weeks   Status Achieved               Plan - 10/24/15 1240    Clinical Impression Statement Eileen [name] reports she is doing well and that she has been discharged from her physician. She states that today will probably be her last day. She has demonstrated improvement with R shoulder AROM with stiffness noted at end range. She continues to demonstrate 3+/5 strength but continues  to work on it at home. she met all goals except for partially meeting  #3, #4. She reports she is able to maintain and progress her current level of function independenlty and will be discharged from PT today.    PT Next Visit Plan discharged from physical therapy.    PT Home Exercise Plan HEP review   Consulted and Agree with Plan of Care Patient          G-Codes - 11/11/2015 1243    Functional Assessment Tool Used FOTO/ clinical judgment    Functional Limitation Carrying, moving and handling objects   Carrying, Moving and Handling Objects Goal Status (B2841) At least 20 percent but less than 40 percent impaired, limited or restricted   Carrying, Moving and Handling Objects Discharge Status [id number]) At least 20 percent but less than 40 percent impaired, limited or restricted      Problem List Patient Active Problem List   Diagnosis Date Noted  . Complete rotator cuff rupture of left shoulder 09/21/2014  . Abdominal pain, epigastric 08/24/2013  . Belching 08/24/2013  . Other abnormal blood chemistry 01/21/2012  . Nonspecific (abnormal) findings on radiological and other examination of biliary tract 01/21/2012  . TIA (transient ischemic attack) 11/05/2011  . PVC's (premature ventricular contractions) 10/11/2011  . Nonspecific abnormal unspecified cardiovascular function study 12/20/2010  . HYPERTENSION, BENIGN 01/20/2009  . Malignant neoplasm of female breast ([place]) 08/23/2008  . HEMORRHOIDS 08/23/2008  . CONSTIPATION, CHRONIC 08/23/2008  . RECTAL BLEEDING 08/23/2008  . Malignant neoplasm of colon ([place]) 01/26/2005   Starr Lake PT, DPT, LAT, ATC  Nov 11, 2015  12:45 PM      [facility] [address] [place], [place], [address] Phone: [phone]   Fax:  [phone]  Name: Eileen [name] MRN: [id number] Date of Birth: February 01, 1946    PHYSICAL THERAPY DISCHARGE SUMMARY  Visits from Start of Care: 18  Current  functional level related to goals / functional outcomes: See goals   Remaining deficits: Stiffness of the R shoulder at end range flexion/ abduction and internal external rotation. Weakness rated at 3+/5 in all planes, but pt plans to continue working on strength and mobility.    Education / Equipment: HEP, Theraband for strengthening.   Plan: Patient agrees to discharge.  Patient goals were partially met. Patient is being discharged due to being pleased with the current functional level.  ?????

## 2015-10-26 ENCOUNTER — Other Ambulatory Visit: Payer: Self-pay | Admitting: Family Medicine

## 2015-10-26 DIAGNOSIS — D508 Other iron deficiency anemias: Secondary | ICD-10-CM | POA: Diagnosis not present

## 2015-10-26 DIAGNOSIS — Z0000 Encounter for general adult medical examination without abnormal findings: Secondary | ICD-10-CM | POA: Diagnosis not present

## 2015-10-26 DIAGNOSIS — I10 Essential (primary) hypertension: Secondary | ICD-10-CM | POA: Diagnosis not present

## 2015-10-26 DIAGNOSIS — M8580 Other specified disorders of bone density and structure, unspecified site: Secondary | ICD-10-CM

## 2015-10-26 DIAGNOSIS — E7800 Pure hypercholesterolemia, unspecified: Secondary | ICD-10-CM | POA: Diagnosis not present

## 2015-10-26 DIAGNOSIS — M8588 Other specified disorders of bone density and structure, other site: Secondary | ICD-10-CM | POA: Diagnosis not present

## 2015-10-26 DIAGNOSIS — G2581 Restless legs syndrome: Secondary | ICD-10-CM | POA: Diagnosis not present

## 2015-10-26 DIAGNOSIS — E559 Vitamin D deficiency, unspecified: Secondary | ICD-10-CM | POA: Diagnosis not present

## 2015-10-31 ENCOUNTER — Encounter: Payer: Medicare Other | Admitting: Physical Therapy

## 2015-11-02 ENCOUNTER — Telehealth: Payer: Self-pay | Admitting: Oncology

## 2015-11-02 NOTE — Telephone Encounter (Signed)
returned call and s.w. pt and r/s appt due to pt going to be out of town....pt ok and aware of new d.t

## 2015-11-06 ENCOUNTER — Other Ambulatory Visit: Payer: Self-pay | Admitting: Family Medicine

## 2015-11-06 DIAGNOSIS — M8580 Other specified disorders of bone density and structure, unspecified site: Secondary | ICD-10-CM

## 2015-11-07 ENCOUNTER — Ambulatory Visit
Admission: RE | Admit: 2015-11-07 | Discharge: 2015-11-07 | Disposition: A | Discharge status: Home / Self Care | Payer: Medicare Other | Source: Ambulatory Visit | Attending: Family Medicine | Admitting: Family Medicine

## 2015-11-07 ENCOUNTER — Encounter: Payer: Medicare Other | Admitting: Physical Therapy

## 2015-11-07 ENCOUNTER — Other Ambulatory Visit: Payer: Self-pay

## 2015-11-07 DIAGNOSIS — M810 Age-related osteoporosis without current pathological fracture: Secondary | ICD-10-CM | POA: Diagnosis not present

## 2015-11-07 DIAGNOSIS — Z780 Asymptomatic menopausal state: Secondary | ICD-10-CM | POA: Diagnosis not present

## 2015-11-07 DIAGNOSIS — M8580 Other specified disorders of bone density and structure, unspecified site: Secondary | ICD-10-CM

## 2015-11-16 ENCOUNTER — Telehealth: Payer: Self-pay | Admitting: *Deleted

## 2015-11-16 ENCOUNTER — Encounter: Payer: Self-pay | Admitting: *Deleted

## 2015-11-16 NOTE — Telephone Encounter (Signed)
No objections from my stand point.  

## 2015-11-16 NOTE — Telephone Encounter (Signed)
Spouse called reporting "Yesterday, Dr. 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  Dr. Kenton Kingfisher has ordered Fosamax.  Would like to know if she should take Fosamax and if it is okay to use with the Femara/letrozole.  Please call me at [phone] and leave a message on answering machine.  She is away with the grandchildren and asked me to call."

## 2015-11-16 NOTE — Telephone Encounter (Signed)
Called with orders from Dr. Alen Blew.  Reports the Fosamax is 70 mg once weekly.

## 2016-02-29 ENCOUNTER — Other Ambulatory Visit: Payer: Medicare Other

## 2016-03-06 ENCOUNTER — Other Ambulatory Visit: Payer: Medicare Other

## 2016-03-07 ENCOUNTER — Ambulatory Visit: Payer: Medicare Other | Admitting: Oncology

## 2016-03-07 ENCOUNTER — Ambulatory Visit (HOSPITAL_BASED_OUTPATIENT_CLINIC_OR_DEPARTMENT_OTHER): Payer: Medicare Other

## 2016-03-07 DIAGNOSIS — C189 Malignant neoplasm of colon, unspecified: Secondary | ICD-10-CM

## 2016-03-07 DIAGNOSIS — C50919 Malignant neoplasm of unspecified site of unspecified female breast: Secondary | ICD-10-CM

## 2016-03-07 LAB — CBC WITH DIFFERENTIAL/PLATELET
BASO%: 0.5 % (ref 0.0–2.0)
Basophils Absolute: 0 10*3/uL (ref 0.0–0.1)
EOS%: 0.7 % (ref 0.0–7.0)
Eosinophils Absolute: 0.1 10*3/uL (ref 0.0–0.5)
HCT: 37.3 % (ref 34.8–46.6)
HGB: 12.3 g/dL (ref 11.6–15.9)
LYMPH%: 24.4 % (ref 14.0–49.7)
MCH: 28.2 pg (ref 25.1–34.0)
MCHC: 33 g/dL (ref 31.5–36.0)
MCV: 85.6 fL (ref 79.5–101.0)
MONO#: 0.9 10*3/uL (ref 0.1–0.9)
MONO%: 11.1 % (ref 0.0–14.0)
NEUT#: 5.1 10*3/uL (ref 1.5–6.5)
NEUT%: 63.3 % (ref 38.4–76.8)
Platelets: 255 10*3/uL (ref 145–400)
RBC: 4.36 10*6/uL (ref 3.70–5.45)
RDW: 13.9 % (ref 11.2–14.5)
WBC: 8.1 10*3/uL (ref 3.9–10.3)
lymph#: 2 10*3/uL (ref 0.9–3.3)

## 2016-03-07 LAB — COMPREHENSIVE METABOLIC PANEL
ALT: 29 U/L (ref 0–55)
AST: 21 U/L (ref 5–34)
Albumin: 3.7 g/dL (ref 3.5–5.0)
Alkaline Phosphatase: 124 U/L (ref 40–150)
Anion Gap: 9 mEq/L (ref 3–11)
BUN: 17.1 mg/dL (ref 7.0–26.0)
CO2: 27 mEq/L (ref 22–29)
Calcium: 9.6 mg/dL (ref 8.4–10.4)
Chloride: 104 mEq/L (ref 98–109)
Creatinine: 0.8 mg/dL (ref 0.6–1.1)
EGFR: 77 mL/min/{1.73_m2} — ABNORMAL LOW (ref >90)
Glucose: 98 mg/dl (ref 70–140)
Potassium: 3.7 mEq/L (ref 3.5–5.1)
Sodium: 140 mEq/L (ref 136–145)
Total Bilirubin: 0.53 mg/dL (ref 0.20–1.20)
Total Protein: 7.1 g/dL (ref 6.4–8.3)

## 2016-03-08 LAB — CEA (PARALLEL TESTING): CEA: 1.5 ng/mL

## 2016-03-08 LAB — CANCER ANTIGEN 27.29: CA 27.29: 37.7 U/mL (ref 0.0–38.6)

## 2016-03-08 LAB — CANCER ANTIGEN 27-29 (PARALLEL TESTING): CA 27.29: 43 U/mL — ABNORMAL HIGH (ref <38)

## 2016-03-12 LAB — CEA (IN HOUSE-CHCC): CEA (CHCC-In House): 1.89 ng/mL (ref 0.00–5.00)

## 2016-03-15 ENCOUNTER — Other Ambulatory Visit: Payer: Self-pay | Admitting: Family Medicine

## 2016-03-15 ENCOUNTER — Ambulatory Visit
Admission: RE | Admit: 2016-03-15 | Discharge: 2016-03-15 | Disposition: A | Discharge status: Home / Self Care | Payer: Medicare Other | Source: Ambulatory Visit | Attending: Family Medicine | Admitting: Family Medicine

## 2016-03-15 DIAGNOSIS — R079 Chest pain, unspecified: Secondary | ICD-10-CM | POA: Diagnosis not present

## 2016-03-15 DIAGNOSIS — R071 Chest pain on breathing: Secondary | ICD-10-CM

## 2016-03-15 DIAGNOSIS — E559 Vitamin D deficiency, unspecified: Secondary | ICD-10-CM | POA: Diagnosis not present

## 2016-03-15 DIAGNOSIS — M546 Pain in thoracic spine: Secondary | ICD-10-CM | POA: Diagnosis not present

## 2016-03-15 DIAGNOSIS — F5101 Primary insomnia: Secondary | ICD-10-CM | POA: Diagnosis not present

## 2016-03-20 ENCOUNTER — Ambulatory Visit (HOSPITAL_BASED_OUTPATIENT_CLINIC_OR_DEPARTMENT_OTHER): Payer: Medicare Other | Admitting: Oncology

## 2016-03-20 ENCOUNTER — Other Ambulatory Visit: Payer: Self-pay | Admitting: Family Medicine

## 2016-03-20 ENCOUNTER — Ambulatory Visit: Payer: Medicare Other | Admitting: Oncology

## 2016-03-20 ENCOUNTER — Telehealth: Payer: Self-pay | Admitting: Oncology

## 2016-03-20 VITALS — BP 166/75 | HR 97 | Temp 98.3°F | Resp 18 | Ht 64.5 in | Wt 149.0 lb

## 2016-03-20 DIAGNOSIS — C189 Malignant neoplasm of colon, unspecified: Secondary | ICD-10-CM

## 2016-03-20 DIAGNOSIS — Z85038 Personal history of other malignant neoplasm of large intestine: Secondary | ICD-10-CM

## 2016-03-20 DIAGNOSIS — M546 Pain in thoracic spine: Secondary | ICD-10-CM

## 2016-03-20 DIAGNOSIS — Z853 Personal history of malignant neoplasm of breast: Secondary | ICD-10-CM

## 2016-03-20 DIAGNOSIS — C50919 Malignant neoplasm of unspecified site of unspecified female breast: Secondary | ICD-10-CM

## 2016-03-20 DIAGNOSIS — M810 Age-related osteoporosis without current pathological fracture: Secondary | ICD-10-CM | POA: Diagnosis not present

## 2016-03-20 NOTE — Progress Notes (Signed)
Hematology and Oncology Follow Up Visit  Eileen [name] [id number] 14-Aug-1945 71 y.o. 03/20/2016 10:49 AM Shirline Frees, MDHarris, [name], MD   Principle Diagnosis:  70 year old woman with the following diagnoses: 1. Breast cancer diagnosed in 1995 she had a recurrence in 1999 and was treated with stem cell transplant and currently in remission. 2. Colon cancer diagnosed in 2006 status post resection adjuvant therapy and currently in remission. She was found to have stage IIA disease.   Prior Therapy: 1. She is status post lumpectomy and lymph node dissection followed by CMF and radiation therapy. 2. She is status post 4 cycles of Adriamycin and Taxotere followed by stem cell transplantation at [facility] on 08/16/1997 due to recurrence and the left supraclavicular lymph node. Her tumor is ER negative PR positive. 3. She is status post right colectomy for stage IIA adenocarcinoma of the colon in October of 2006. She is status post 10 cycles of FOLFOX concluded in 06/2005  Current therapy: Femara since 2001.  Interim History:  Eileen [name] presents today for a followup visit. Since her last visit, she reports no major changes in her health. He continues to be very active and independent. She exercises regularly and denied any decline in her energy or performance status.   She does not report any any abdominal pain, nausea, vomiting, diarrhea, or change in her bowel habits. She continues to be on calcium supplement without any complications. She continued to take Femara without any recent complications. She denied any breast masses or lesions. She denied any dysphagia or odynophagia.   She does not report any headaches or blurry vision. Does not on any syncope. She does not report any chest pain or shortness of breath. Does not report any cough or hemoptysis. She does not report any nausea, vomiting or change in her bowel habits. She does not report any hematochezia or melena. Does not  report any lymphadenopathy or petechiae. Rest of her review of systems unremarkable.  Medications: I have reviewed the patient's current medications.  Current Outpatient Prescriptions  Medication Sig Dispense Refill  . alendronate (FOSAMAX) 70 MG tablet Take 70 mg by mouth once a week. Take with a full glass of water on an empty stomach.    . calcium carbonate (OS-CAL) 600 MG TABS tablet Take 600 mg by mouth daily.     . fluoruracil (CARAC) 0.5 % cream Apply topically 2 (two) times daily. Reported on 08/08/2015    . letrozole (FEMARA) 2.5 MG tablet Take 2.5 mg by mouth daily.      Marland Kitchen losartan (COZAAR) 50 MG tablet Take 50 mg by mouth daily.    . Multiple Vitamin (MULTIVITAMIN WITH MINERALS) TABS tablet Take 1 tablet by mouth daily.    . ranitidine (ZANTAC) 150 MG tablet Take 1 tablet (150 mg total) by mouth at bedtime. 30 tablet 6  . Vitamin D, Ergocalciferol, (DRISDOL) 50000 UNITS CAPS Take 50,000 Units by mouth every 7 (seven) days. Wednesday.     No current facility-administered medications for this visit.      Allergies:  Allergies  Allergen Reactions  . Levofloxacin Other (See Comments)    Red streaks to IV site when IV dose infusing and arm feeling very irritated, throbbing, painful  . Tape     Can use paper tape, adhesive causes redness    Past Medical History, Surgical history, Social history, and Family History were reviewed and updated.   Physical Exam: Blood pressure is  132/82, heart rate 73, temperature is 98 with  a  respiratory rate of 16.  ECOG: 0 General appearance: Well-appearing woman without distress. Head: Normocephalic, without obvious abnormality oral ulcers or lesions. Neck: no adenopathy Lymph nodes: Cervical, supraclavicular, and axillary nodes normal. Heart:regular rate and rhythm, S1, S2 normal, no murmur, click, rub or gallop Lung:chest clear, no wheezing, rales, normal symmetric air entry Abdomin: soft, non-tender, without masses or organomegaly no  shifting dullness or ascites. EXT:no erythema, induration, or nodules   Lab Results: Lab Results  Component Value Date   WBC 8.1 03/07/2016   HGB 12.3 03/07/2016   HCT 37.3 03/07/2016   MCV 85.6 03/07/2016   PLT 255 03/07/2016     Chemistry      Component Value Date/Time   NA 140 03/07/2016 1531   K 3.7 03/07/2016 1531   CL 97 09/28/2012 0231   CL 100 03/18/2012 1422   CO2 27 03/07/2016 1531   BUN 17.1 03/07/2016 1531   CREATININE 0.8 03/07/2016 1531      Component Value Date/Time   CALCIUM 9.6 03/07/2016 1531   ALKPHOS 124 03/07/2016 1531   AST 21 03/07/2016 1531   ALT 29 03/07/2016 1531   BILITOT 0.53 03/07/2016 1531     Results for Eileen [name], Eileen [name] (MRN [id number]) as of 03/20/2016 10:34  Ref. Range 03/07/2016 15:31  CA 27.29 Latest Ref Range: 0.0 - 38.6 U/mL 37.7  CEA Latest Units: ng/mL 1.5    Impression and Plan:  70 year old woman with the following issues:  1. Colon cancer diagnosed in 2006 and she is status post surgical resection followed by adjuvant FOLFOX chemotherapy that concluded in December of 2006. Her initial staging at stage IIA.  She is currently on active surveillance without any evidence of recurrence.   She does not require any further imaging studies but I continue to advise her about screening colonoscopies and needed to be continued.  2. Breast cancer: Diagnosed in 1995 was treated with a lumpectomy and radiation followed by adjuvant chemotherapy. She had relapse in 1999 and was treated with high dose chemotherapy and stem cell transplant. She has no evidence of relapse disease and continued to be on Femara at this time.    The risks and benefits of continuing this drug were discussed again. She is ready diagnosed with osteoporosis and this medication will likely exacerbated. After discussion today she is agreeable to discontinue this medication for the time being given the fact that she denied for 16 years will be on any recommendations at this  time.  Although her risk of cancer recurrence is very low is still a possibility and she will continue annual surveillance.  3. Bone density: To continue to be on calcium supplements and she will need a repeat DEXA scan in one year. She continued to take Fosamax on a weekly basis.  4. Follow-up: Will be in 12 months sooner if needed to.  [facility], MD 8/23/201710:49 AM

## 2016-03-20 NOTE — Telephone Encounter (Signed)
GAVE PATIENT AVS REPORT AND APPOINTMENTS FOR AUGUST 2018.  °

## 2016-03-29 ENCOUNTER — Ambulatory Visit
Admission: RE | Admit: 2016-03-29 | Discharge: 2016-03-29 | Disposition: A | Discharge status: Home / Self Care | Payer: Medicare Other | Source: Ambulatory Visit | Attending: Family Medicine | Admitting: Family Medicine

## 2016-03-29 DIAGNOSIS — S22050A Wedge compression fracture of T5-T6 vertebra, initial encounter for closed fracture: Secondary | ICD-10-CM | POA: Diagnosis not present

## 2016-03-29 DIAGNOSIS — S22060A Wedge compression fracture of T7-T8 vertebra, initial encounter for closed fracture: Secondary | ICD-10-CM | POA: Diagnosis not present

## 2016-03-29 DIAGNOSIS — M546 Pain in thoracic spine: Secondary | ICD-10-CM

## 2016-04-09 DIAGNOSIS — M546 Pain in thoracic spine: Secondary | ICD-10-CM | POA: Diagnosis not present

## 2016-04-09 DIAGNOSIS — M549 Dorsalgia, unspecified: Secondary | ICD-10-CM | POA: Diagnosis not present

## 2016-04-09 DIAGNOSIS — M791 Myalgia: Secondary | ICD-10-CM | POA: Diagnosis not present

## 2016-04-29 DIAGNOSIS — E559 Vitamin D deficiency, unspecified: Secondary | ICD-10-CM | POA: Diagnosis not present

## 2016-04-29 DIAGNOSIS — E7800 Pure hypercholesterolemia, unspecified: Secondary | ICD-10-CM | POA: Diagnosis not present

## 2016-04-29 DIAGNOSIS — I10 Essential (primary) hypertension: Secondary | ICD-10-CM | POA: Diagnosis not present

## 2016-04-29 DIAGNOSIS — K219 Gastro-esophageal reflux disease without esophagitis: Secondary | ICD-10-CM | POA: Diagnosis not present

## 2016-04-29 DIAGNOSIS — F5101 Primary insomnia: Secondary | ICD-10-CM | POA: Diagnosis not present

## 2016-04-29 DIAGNOSIS — Z23 Encounter for immunization: Secondary | ICD-10-CM | POA: Diagnosis not present

## 2016-05-23 DIAGNOSIS — L570 Actinic keratosis: Secondary | ICD-10-CM | POA: Diagnosis not present

## 2016-05-23 DIAGNOSIS — D485 Neoplasm of uncertain behavior of skin: Secondary | ICD-10-CM | POA: Diagnosis not present

## 2016-05-23 DIAGNOSIS — C44729 Squamous cell carcinoma of skin of left lower limb, including hip: Secondary | ICD-10-CM | POA: Diagnosis not present

## 2016-05-23 DIAGNOSIS — L821 Other seborrheic keratosis: Secondary | ICD-10-CM | POA: Diagnosis not present

## 2016-05-23 DIAGNOSIS — D2261 Melanocytic nevi of right upper limb, including shoulder: Secondary | ICD-10-CM | POA: Diagnosis not present

## 2016-06-27 DIAGNOSIS — L308 Other specified dermatitis: Secondary | ICD-10-CM | POA: Diagnosis not present

## 2016-06-27 DIAGNOSIS — C44729 Squamous cell carcinoma of skin of left lower limb, including hip: Secondary | ICD-10-CM | POA: Diagnosis not present

## 2016-08-07 ENCOUNTER — Encounter: Payer: Self-pay | Admitting: Gastroenterology

## 2016-08-08 ENCOUNTER — Ambulatory Visit (INDEPENDENT_AMBULATORY_CARE_PROVIDER_SITE_OTHER): Payer: Medicare Other | Admitting: Obstetrics & Gynecology

## 2016-08-08 ENCOUNTER — Encounter: Payer: Self-pay | Admitting: Obstetrics & Gynecology

## 2016-08-08 VITALS — BP 130/78 | HR 86 | Resp 14 | Ht 64.0 in | Wt 145.0 lb

## 2016-08-08 DIAGNOSIS — Z124 Encounter for screening for malignant neoplasm of cervix: Secondary | ICD-10-CM

## 2016-08-08 DIAGNOSIS — Z01419 Encounter for gynecological examination (general) (routine) without abnormal findings: Secondary | ICD-10-CM

## 2016-08-08 NOTE — Progress Notes (Signed)
71 y.o. G3P2 MarriedCaucasianF here for annual exam.  Doing well.  Denies vaginal bleeding.  Sons are doing well.   Patient's last menstrual period was 07/30/1995.          Sexually active: No.  The current method of family planning is status post hysterectomy.    Exercising: Yes.    walking Smoker:  Former smoker  Health Maintenance: Pap:  05/30/15 negative  History of abnormal Pap:  no MMG:  10/04/15 BIRADS 1 negative  Colonoscopy:  04/16 Dr. Olevia Perches.  Repeat 5 years, patient states she would like to do in 3 years BMD:   11/07/15 osteoporosis  TDaP:  01/02/12  Pneumonia vaccine(s): never Zostavax:   never Hep C testing: discuss with provider Screening Labs: PCP, Hb today: same, Urine today: PCP   reports that she quit smoking about 25 years ago. She has never used smokeless tobacco. She reports that she drinks about 1.2 oz of alcohol per week . She reports that she does not use drugs.  Past Medical History:  Diagnosis Date  . Anemia   . Axillary mass 7/99   Left  . Barrett's esophagus   . Blood transfusion without reported diagnosis   . CARCINOMA, BREAST 1995   left  . Cervical spinal stenosis   . 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  . Headache(784.0)   . HEMORRHOIDS   . Hypertension     Past Surgical History:  Procedure Laterality Date  . BONE MARROW TRANSPLANT  1999  . BREAST LUMPECTOMY  11/95   left breast  . BUNIONECTOMY  3/09, 10/09   right and left  . COLON SURGERY    . COLONOSCOPY    . ERCP  01/21/2012   Procedure: ENDOSCOPIC RETROGRADE CHOLANGIOPANCREATOGRAPHY (ERCP);  Surgeon: Lafayette Dragon, MD;  Location: [facility];  Service: Endoscopy;  Laterality: [name];  . hemicolectomy  01/2005   right  . HYSTEROSCOPY  08/30/94  . INGUINAL HERNIA REPAIR    . NECK MASS EXCISION Left 7/99  . ROTATOR CUFF REPAIR Right 09/08/13   full tear  . TOTAL ABDOMINAL HYSTERECTOMY W/ BILATERAL SALPINGOOPHORECTOMY  1996  . TUNNELED VENOUS CATHETER PLACEMENT  9/98    Current Outpatient  Prescriptions  Medication Sig Dispense Refill  . calcium carbonate (OS-CAL) 600 MG TABS tablet Take 600 mg by mouth daily.     . fluoruracil (CARAC) 0.5 % cream Apply topically 2 (two) times daily. Reported on 08/08/2015    . losartan (COZAAR) 50 MG tablet Take 50 mg by mouth daily.    . Multiple Vitamin (MULTIVITAMIN WITH MINERALS) TABS tablet Take 1 tablet by mouth daily.    . traZODone (DESYREL) 50 MG tablet TK 1 T PO QD HS PRN    . Vitamin D, Ergocalciferol, (DRISDOL) 50000 UNITS CAPS Take 50,000 Units by mouth every 7 (seven) days. Wednesday.     No current facility-administered medications for this visit.     Family History  Problem Relation Age of Onset  . Uterine cancer Mother   . Heart attack Father   . Colon cancer Neg Hx   . Esophageal cancer Neg Hx   . Stomach cancer Neg Hx   . Rectal cancer Neg Hx     ROS:  Pertinent items are noted in HPI.  Otherwise, a comprehensive ROS was negative.  Exam:   BP 130/78 (BP Location: Right Arm, Patient Position: Sitting, Cuff Size: Normal)   Pulse 86   Resp 14   Ht 5\' 4"  (  1.626 m)   Wt 145 lb (65.8 kg)   LMP 07/30/1995   BMI 24.89 kg/m   Weight change:-3#  Height: 5\' 4"  (162.6 cm)  Ht Readings from Last 3 Encounters:  08/08/16 5\' 4"  (1.626 m)  03/20/16 5' 4.5" (1.638 m)  05/18/15 5' 4.5" (1.638 m)    General appearance: alert, cooperative and appears stated age Head: Normocephalic, without obvious abnormality, atraumatic Neck: no adenopathy, supple, symmetrical, trachea midline and thyroid normal to inspection and palpation Lungs: clear to auscultation bilaterally Breasts: normal appearance, no masses or tenderness, well healed scars on left breast present Heart: regular rate and rhythm Abdomen: soft, non-tender; bowel sounds normal; no masses,  no organomegaly Extremities: extremities normal, atraumatic, no cyanosis or edema Skin: Skin color, texture, turgor normal. No rashes or lesions Lymph nodes: Cervical,  supraclavicular, and axillary nodes normal. No abnormal inguinal nodes palpated Neurologic: Grossly normal   Pelvic: External genitalia:  no lesions              Urethra:  normal appearing urethra with no masses, tenderness or lesions              Bartholins and Skenes: normal                 Vagina: normal appearing vagina with normal color and discharge, no lesions              Cervix: absent              Pap taken: Yes.   Bimanual Exam:  Uterus:  uterus absent              Adnexa: no mass, fullness, tenderness               Rectovaginal: Confirms               Anus:  normal sphincter tone, no lesions  Chaperone was present for exam.  A:      Well Woman with normal exam  H/O TAH/BSO 1996 No HRT  H/O colon cancer s/p hemicolectomy 7/06. Colonoscopy every three years. (most recent colonoscopy 2016, Dr. Olevia Perches told pt she could go five years.  Pt uncomfortable with this.) H/O breast cancer with recurrence 1995/1998 with clavicular mass s/p resection, bone marrow transplant Vit D deficiency. Followed by PCP.   P:  Mammogram yearly.  Pap today.   Labs and vaccines with Dr. Kenton Kingfisher. BMD due next year Return annually or prn

## 2016-08-12 ENCOUNTER — Encounter: Payer: Self-pay | Admitting: Gastroenterology

## 2016-08-12 LAB — IPS PAP SMEAR ONLY

## 2016-08-30 ENCOUNTER — Ambulatory Visit: Payer: Medicare Other | Admitting: Obstetrics & Gynecology

## 2016-09-02 ENCOUNTER — Other Ambulatory Visit: Payer: Self-pay | Admitting: Family Medicine

## 2016-09-02 DIAGNOSIS — Z1231 Encounter for screening mammogram for malignant neoplasm of breast: Secondary | ICD-10-CM

## 2016-09-05 ENCOUNTER — Other Ambulatory Visit: Payer: Self-pay | Admitting: Family Medicine

## 2016-09-05 ENCOUNTER — Ambulatory Visit
Admission: RE | Admit: 2016-09-05 | Discharge: 2016-09-05 | Disposition: A | Discharge status: Home / Self Care | Payer: Medicare Other | Source: Ambulatory Visit | Attending: Family Medicine | Admitting: Family Medicine

## 2016-09-05 DIAGNOSIS — R079 Chest pain, unspecified: Secondary | ICD-10-CM | POA: Diagnosis not present

## 2016-09-05 DIAGNOSIS — M549 Dorsalgia, unspecified: Secondary | ICD-10-CM

## 2016-09-05 DIAGNOSIS — M546 Pain in thoracic spine: Secondary | ICD-10-CM | POA: Diagnosis not present

## 2016-09-05 DIAGNOSIS — R0789 Other chest pain: Secondary | ICD-10-CM

## 2016-09-25 DIAGNOSIS — H353131 Nonexudative age-related macular degeneration, bilateral, early dry stage: Secondary | ICD-10-CM | POA: Diagnosis not present

## 2016-09-25 DIAGNOSIS — H40013 Open angle with borderline findings, low risk, bilateral: Secondary | ICD-10-CM | POA: Diagnosis not present

## 2016-09-25 DIAGNOSIS — H2513 Age-related nuclear cataract, bilateral: Secondary | ICD-10-CM | POA: Diagnosis not present

## 2016-10-01 ENCOUNTER — Encounter: Payer: Self-pay | Admitting: Gastroenterology

## 2016-10-01 ENCOUNTER — Other Ambulatory Visit: Payer: Self-pay

## 2016-10-01 ENCOUNTER — Other Ambulatory Visit (INDEPENDENT_AMBULATORY_CARE_PROVIDER_SITE_OTHER): Payer: Medicare Other

## 2016-10-01 ENCOUNTER — Ambulatory Visit (INDEPENDENT_AMBULATORY_CARE_PROVIDER_SITE_OTHER): Payer: Medicare Other | Admitting: Gastroenterology

## 2016-10-01 VITALS — BP 120/60 | HR 60 | Ht 64.5 in | Wt 144.6 lb

## 2016-10-01 DIAGNOSIS — Z85038 Personal history of other malignant neoplasm of large intestine: Secondary | ICD-10-CM

## 2016-10-01 DIAGNOSIS — Z9049 Acquired absence of other specified parts of digestive tract: Secondary | ICD-10-CM

## 2016-10-01 DIAGNOSIS — Z9889 Other specified postprocedural states: Secondary | ICD-10-CM | POA: Diagnosis not present

## 2016-10-01 LAB — FOLATE: Folate: >23.5 ng/mL (ref >5.9)

## 2016-10-01 LAB — VITAMIN B12: Vitamin B-12: 698 pg/mL (ref 211–911)

## 2016-10-01 NOTE — Progress Notes (Signed)
Eileen [name]    [id number]    04-15-1946  Primary Care Physician:[name], [name], MD  Referring Physician: Shirline Frees, MD [address] [place], [place] [address]  Chief complaint: history of colon cancer  HPI: 71 yr F previously followed by Dr Olevia Perches, was last seen by Alonza Bogus Jan 2015 is here to establish care with me. She has history of breast cancer and was diagnosed with colon cancer in 2006. His colonoscopy in March 2016 showed ileocolonic anastomosis and small internal hemorrhoids otherwise was unremarkable. She had 5 colonoscopies since diagnosis of colon cancer and resection. Patient was given 5 year recall on her last colonoscopy and she is concerned about the time interval as she was getting routine screening colonoscopy prior to the diagnosis of colon cancer, was given interval of 7 years but developed colon cancer on year 4. She is feeling well otherwise and has no other complaints. She was having change in bowel habits with alternating constipation and diarrhea while she was taking medication to prevent osteoporosis, improved after she stopped taking it. Denies any nausea, vomiting, abdominal pain, melena or bright red blood per rectum  Outpatient Encounter Prescriptions as of 10/01/2016  Medication Sig  . calcium carbonate (OS-CAL) 600 MG TABS tablet Take 600 mg by mouth daily.   . fluoruracil (CARAC) 0.5 % cream Apply topically 2 (two) times daily. Reported on 08/08/2015  . losartan (COZAAR) 50 MG tablet Take 50 mg by mouth daily.  . Multiple Vitamin (MULTIVITAMIN WITH MINERALS) TABS tablet Take 1 tablet by mouth daily.  . traZODone (DESYREL) 50 MG tablet TK 1 T PO QD HS PRN  . Vitamin D, Ergocalciferol, (DRISDOL) 50000 UNITS CAPS Take 50,000 Units by mouth every 7 (seven) days. Wednesday.   No facility-administered encounter medications on file as of 10/01/2016.     Allergies as of 10/01/2016 - Review Complete 10/01/2016  Allergen Reaction  Noted  . Levofloxacin Other (See Comments) 11/05/2011  . Tape  08/27/2013    Past Medical History:  Diagnosis Date  . Anemia   . Axillary mass 7/99   Left  . Barrett's esophagus   . Blood transfusion without reported diagnosis   . CARCINOMA, BREAST 1995   left  . Cervical spinal stenosis   . 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  . Headache(784.0)   . HEMORRHOIDS   . Hypertension     Past Surgical History:  Procedure Laterality Date  . BONE MARROW TRANSPLANT  1999  . BREAST LUMPECTOMY  11/95   left breast  . BUNIONECTOMY  3/09, 10/09   right and left  . COLON SURGERY    . COLONOSCOPY    . ERCP  01/21/2012   Procedure: ENDOSCOPIC RETROGRADE CHOLANGIOPANCREATOGRAPHY (ERCP);  Surgeon: Lafayette Dragon, MD;  Location: [facility];  Service: Endoscopy;  Laterality: [name];  . hemicolectomy  01/2005   right  . HYSTEROSCOPY  08/30/94  . INGUINAL HERNIA REPAIR    . NECK MASS EXCISION Left 7/99  . ROTATOR CUFF REPAIR Right 09/08/13   full tear  . TOTAL ABDOMINAL HYSTERECTOMY W/ BILATERAL SALPINGOOPHORECTOMY  1996  . TUNNELED VENOUS CATHETER PLACEMENT  9/98    Family History  Problem Relation Age of Onset  . Uterine cancer Mother   . Heart attack Father   . Colon cancer Neg Hx   . Esophageal cancer Neg Hx   . Stomach cancer Neg Hx   . Rectal cancer  Neg Hx     Social History   Social History  . Marital status: Married    Spouse name: [name]  . Number of children: [name]  . Years of education: [name]   Occupational History  . Not on file.   Social History Main Topics  . Smoking status: Former Smoker    Quit date: 12/20/1990  . Smokeless tobacco: Never Used  . Alcohol use 1.2 oz/week    2 Standard drinks or equivalent per week     Comment: occasionally  . Drug use: No  . Sexual activity: No     Comment: hysterectomy   Other Topics Concern  . Not on file   Social History Narrative  . No narrative on file      Review of systems: Review of Systems  Constitutional:  Negative for fever and chills.  HENT: Negative.   Eyes: Negative for blurred vision.  Respiratory: Negative for cough, shortness of breath and wheezing.   Cardiovascular: Negative for chest pain and palpitations.  Gastrointestinal: as per HPI Genitourinary: Negative for dysuria, urgency, frequency and hematuria.  Musculoskeletal: Negative for myalgias, back pain and joint pain.  Skin: Negative for itching and rash.  Neurological: Negative for dizziness, tremors, focal weakness, seizures and loss of consciousness.  Endo/Heme/Allergies: Positive for seasonal allergies.  Psychiatric/Behavioral: Negative for depression, suicidal ideas and hallucinations.  All other systems reviewed and are negative.   Physical Exam: Vitals:   10/01/16 1050  BP: 120/60  Pulse: 60   Body mass index is 24.44 kg/m. Gen:      No acute distress HEENT:  EOMI, sclera anicteric Neck:     No masses; no thyromegaly Lungs:    Clear to auscultation bilaterally; normal respiratory effort CV:         Regular rate and rhythm; no murmurs Abd:      + bowel sounds; soft, non-tender; no palpable masses, no distension Ext:    No edema; adequate peripheral perfusion Skin:      Warm and dry; no rash Neuro: alert and oriented x 3 Psych: normal mood and affect  Data Reviewed:  Reviewed labs, radiology imaging, old records and pertinent past GI work up   Assessment and Plan/Recommendations:  71 year old female with history of breast cancer and colon cancer diagnosed in 2006, subsequent surveillance colonoscopy in June 2007, August 2008, Feb 2010, February 2013, and most recent in March 2016.  Patient is concerned about a five-year interval for recall surveillance colonoscopy  She did not have any polyps removed on subsequent surveillance colonoscopies x5 since diagnosis of colon cancer in 2006; reassured patient and discussed in detail the findings of her prior colonoscopies  And also the reason for 5 year recall  interval based on current guidelines  Advised patient to call if she notices any change in bowel habits, abdominal pain or blood per rectum, will need to consider doing colonoscopy earlier if she develops any symptoms  Will check B12 level, s/p R hemicolectomy with ileocolonic anastamosis  25 minutes was spent face-to-face with the patient. Greater than 50% of the time used for counseling as well as treatment plan and follow-up. She had multiple questions which were answered to her satisfaction  K. Denzil Magnuson , MD [phone] Mon-Fri 8a-5p [phone] after 5p, weekends, holidays  CC: Shirline Frees, MD

## 2016-10-01 NOTE — Patient Instructions (Addendum)
Follow up as needed   Your colonoscopy recall is 09/2019  Go to the basement for labs

## 2016-10-14 ENCOUNTER — Ambulatory Visit
Admission: RE | Admit: 2016-10-14 | Discharge: 2016-10-14 | Disposition: A | Discharge status: Home / Self Care | Payer: Medicare Other | Source: Ambulatory Visit | Attending: Family Medicine | Admitting: Family Medicine

## 2016-10-14 DIAGNOSIS — Z1231 Encounter for screening mammogram for malignant neoplasm of breast: Secondary | ICD-10-CM

## 2016-10-17 DIAGNOSIS — Z853 Personal history of malignant neoplasm of breast: Secondary | ICD-10-CM | POA: Diagnosis not present

## 2016-10-17 DIAGNOSIS — Z8719 Personal history of other diseases of the digestive system: Secondary | ICD-10-CM | POA: Diagnosis not present

## 2016-10-17 DIAGNOSIS — Z85038 Personal history of other malignant neoplasm of large intestine: Secondary | ICD-10-CM | POA: Diagnosis not present

## 2016-10-17 DIAGNOSIS — Z9484 Stem cells transplant status: Secondary | ICD-10-CM | POA: Diagnosis not present

## 2016-10-28 DIAGNOSIS — E7800 Pure hypercholesterolemia, unspecified: Secondary | ICD-10-CM | POA: Diagnosis not present

## 2016-10-28 DIAGNOSIS — Z85038 Personal history of other malignant neoplasm of large intestine: Secondary | ICD-10-CM | POA: Diagnosis not present

## 2016-10-28 DIAGNOSIS — Z853 Personal history of malignant neoplasm of breast: Secondary | ICD-10-CM | POA: Diagnosis not present

## 2016-10-28 DIAGNOSIS — I10 Essential (primary) hypertension: Secondary | ICD-10-CM | POA: Diagnosis not present

## 2016-10-28 DIAGNOSIS — F5101 Primary insomnia: Secondary | ICD-10-CM | POA: Diagnosis not present

## 2016-10-28 DIAGNOSIS — M859 Disorder of bone density and structure, unspecified: Secondary | ICD-10-CM | POA: Diagnosis not present

## 2016-10-28 DIAGNOSIS — M19042 Primary osteoarthritis, left hand: Secondary | ICD-10-CM | POA: Diagnosis not present

## 2016-10-28 DIAGNOSIS — Z0000 Encounter for general adult medical examination without abnormal findings: Secondary | ICD-10-CM | POA: Diagnosis not present

## 2016-10-31 MED FILL — traZODone HCL 50 MG TABS: 50 | 30 days supply | Qty: 30 | Fill #0

## 2016-11-05 MED FILL — LOSARTAN POTASSIUM 50 MG TA: 50 | 90 days supply | Qty: 90 | Fill #0

## 2016-11-07 MED FILL — VIT D2 1.25 MG (50,000 UNIT: 1.25 MG | 104 days supply | Qty: 15 | Fill #0

## 2016-11-08 DIAGNOSIS — Z1211 Encounter for screening for malignant neoplasm of colon: Secondary | ICD-10-CM | POA: Diagnosis not present

## 2017-02-24 MED FILL — LOSARTAN POTASSIUM 50 MG TA: 50 | 90 days supply | Qty: 90 | Fill #0

## 2017-02-24 MED FILL — VIT D2 1.25 MG (50,000 UNIT: 1.25 MG | 84 days supply | Qty: 12 | Fill #0

## 2017-02-26 DIAGNOSIS — R946 Abnormal results of thyroid function studies: Secondary | ICD-10-CM | POA: Diagnosis not present

## 2017-03-04 DIAGNOSIS — H16213 Exposure keratoconjunctivitis, bilateral: Secondary | ICD-10-CM | POA: Diagnosis not present

## 2017-03-04 DIAGNOSIS — H04123 Dry eye syndrome of bilateral lacrimal glands: Secondary | ICD-10-CM | POA: Diagnosis not present

## 2017-03-04 DIAGNOSIS — H16143 Punctate keratitis, bilateral: Secondary | ICD-10-CM | POA: Diagnosis not present

## 2017-03-12 ENCOUNTER — Other Ambulatory Visit (HOSPITAL_BASED_OUTPATIENT_CLINIC_OR_DEPARTMENT_OTHER): Payer: Medicare Other

## 2017-03-12 DIAGNOSIS — C189 Malignant neoplasm of colon, unspecified: Secondary | ICD-10-CM | POA: Diagnosis not present

## 2017-03-12 DIAGNOSIS — Z853 Personal history of malignant neoplasm of breast: Secondary | ICD-10-CM | POA: Diagnosis not present

## 2017-03-12 DIAGNOSIS — C50919 Malignant neoplasm of unspecified site of unspecified female breast: Secondary | ICD-10-CM | POA: Diagnosis not present

## 2017-03-12 DIAGNOSIS — Z85038 Personal history of other malignant neoplasm of large intestine: Secondary | ICD-10-CM

## 2017-03-12 LAB — CBC WITH DIFFERENTIAL/PLATELET
BASO%: 0.7 % (ref 0.0–2.0)
Basophils Absolute: 0 10*3/uL (ref 0.0–0.1)
EOS%: 1.5 % (ref 0.0–7.0)
Eosinophils Absolute: 0.1 10*3/uL (ref 0.0–0.5)
HCT: 39.2 % (ref 34.8–46.6)
HGB: 12.9 g/dL (ref 11.6–15.9)
LYMPH%: 24.2 % (ref 14.0–49.7)
MCH: 28.5 pg (ref 25.1–34.0)
MCHC: 32.9 g/dL (ref 31.5–36.0)
MCV: 86.6 fL (ref 79.5–101.0)
MONO#: 0.5 10*3/uL (ref 0.1–0.9)
MONO%: 7 % (ref 0.0–14.0)
NEUT#: 4.4 10*3/uL (ref 1.5–6.5)
NEUT%: 66.6 % (ref 38.4–76.8)
Platelets: 218 10*3/uL (ref 145–400)
RBC: 4.53 10*6/uL (ref 3.70–5.45)
RDW: 13.3 % (ref 11.2–14.5)
WBC: 6.6 10*3/uL (ref 3.9–10.3)
lymph#: 1.6 10*3/uL (ref 0.9–3.3)

## 2017-03-12 LAB — CEA (IN HOUSE-CHCC): CEA (CHCC-In House): 2.58 ng/mL (ref 0.00–5.00)

## 2017-03-12 LAB — COMPREHENSIVE METABOLIC PANEL
ALT: 32 U/L (ref 0–55)
AST: 27 U/L (ref 5–34)
Albumin: 3.6 g/dL (ref 3.5–5.0)
Alkaline Phosphatase: 71 U/L (ref 40–150)
Anion Gap: 8 mEq/L (ref 3–11)
BUN: 18.8 mg/dL (ref 7.0–26.0)
CO2: 25 mEq/L (ref 22–29)
Calcium: 9.7 mg/dL (ref 8.4–10.4)
Chloride: 108 mEq/L (ref 98–109)
Creatinine: 0.8 mg/dL (ref 0.6–1.1)
EGFR: 78 mL/min/{1.73_m2} — ABNORMAL LOW (ref >90)
Glucose: 87 mg/dl (ref 70–140)
Potassium: 4.2 mEq/L (ref 3.5–5.1)
Sodium: 142 mEq/L (ref 136–145)
Total Bilirubin: 0.64 mg/dL (ref 0.20–1.20)
Total Protein: 6.5 g/dL (ref 6.4–8.3)

## 2017-03-13 DIAGNOSIS — L570 Actinic keratosis: Secondary | ICD-10-CM | POA: Diagnosis not present

## 2017-03-13 LAB — CEA (PARALLEL TESTING): CEA: 1.5 ng/mL

## 2017-03-13 LAB — CANCER ANTIGEN 27.29: CA 27.29: 34.7 U/mL (ref 0.0–38.6)

## 2017-03-14 DIAGNOSIS — H10413 Chronic giant papillary conjunctivitis, bilateral: Secondary | ICD-10-CM | POA: Diagnosis not present

## 2017-03-14 DIAGNOSIS — M26609 Unspecified temporomandibular joint disorder, unspecified side: Secondary | ICD-10-CM | POA: Diagnosis not present

## 2017-03-14 DIAGNOSIS — H04123 Dry eye syndrome of bilateral lacrimal glands: Secondary | ICD-10-CM | POA: Diagnosis not present

## 2017-03-14 DIAGNOSIS — H903 Sensorineural hearing loss, bilateral: Secondary | ICD-10-CM | POA: Diagnosis not present

## 2017-03-14 DIAGNOSIS — H16143 Punctate keratitis, bilateral: Secondary | ICD-10-CM | POA: Diagnosis not present

## 2017-03-14 DIAGNOSIS — H9313 Tinnitus, bilateral: Secondary | ICD-10-CM | POA: Diagnosis not present

## 2017-03-14 DIAGNOSIS — H16213 Exposure keratoconjunctivitis, bilateral: Secondary | ICD-10-CM | POA: Diagnosis not present

## 2017-03-14 DIAGNOSIS — H2513 Age-related nuclear cataract, bilateral: Secondary | ICD-10-CM | POA: Diagnosis not present

## 2017-03-14 MED FILL — FLUOROMETHOLONE 0.1% DROPS: 0.1 | 13 days supply | Qty: 5 | Fill #0

## 2017-03-19 ENCOUNTER — Ambulatory Visit (HOSPITAL_BASED_OUTPATIENT_CLINIC_OR_DEPARTMENT_OTHER): Payer: Medicare Other | Admitting: Oncology

## 2017-03-19 ENCOUNTER — Telehealth: Payer: Self-pay | Admitting: Oncology

## 2017-03-19 VITALS — BP 153/119 | HR 92 | Temp 98.0°F | Resp 18 | Ht 64.5 in | Wt 146.2 lb

## 2017-03-19 DIAGNOSIS — M810 Age-related osteoporosis without current pathological fracture: Secondary | ICD-10-CM | POA: Diagnosis not present

## 2017-03-19 DIAGNOSIS — Z85038 Personal history of other malignant neoplasm of large intestine: Secondary | ICD-10-CM

## 2017-03-19 DIAGNOSIS — C50919 Malignant neoplasm of unspecified site of unspecified female breast: Secondary | ICD-10-CM

## 2017-03-19 DIAGNOSIS — C189 Malignant neoplasm of colon, unspecified: Secondary | ICD-10-CM

## 2017-03-19 NOTE — Progress Notes (Signed)
Hematology and Oncology Follow Up Visit  Eileen [name] [id number] 01-02-1946 71 y.o. 03/19/2017 10:50 AM Eileen [name], MDHarris, Eileen Saxon, MD   Principle Diagnosis:  71 year old woman with the following diagnoses: 1. Breast cancer diagnosed in 1995 she had a recurrence in 1999 and was treated with stem cell transplant and currently in remission. 2. Colon cancer diagnosed in 2006 status post resection adjuvant therapy and currently in remission. She was found to have stage IIA disease.   Prior Therapy: 1. She is status post lumpectomy and lymph node dissection followed by [facility] and radiation therapy. 2. She is status post 4 cycles of Adriamycin and Taxotere followed by stem cell transplantation at [facility] on 08/16/1997 due to recurrence and the left supraclavicular lymph node. Her tumor is ER negative PR positive. 3. She is status post right colectomy for stage IIA adenocarcinoma of the colon in October of 2006. She is status post 10 cycles of FOLFOX concluded in 06/2005  Current therapy: Femara since 2001.  Interim History:  Eileen [name] presents today for a followup visit. Since her last visit, she reports doing reasonably well without any changes. She does report periodic skin cancer that is removed by her dermatologist. None of these were apparently melanoma. He continues to be very active and independent. She exercises regularly and denied any decline in her energy or performance status. She denied any bone pain or pathological fractures. She denied any GI symptoms. She is up-to-date on her mammography and colonoscopy.   She does not report any headaches or blurry vision. Does not on any syncope. She does not report any chest pain or shortness of breath. Does not report any cough or hemoptysis. She does not report any nausea, vomiting or change in her bowel habits. She does not report any hematochezia or melena. Does not report any lymphadenopathy or petechiae. Rest of her review of  systems unremarkable.  Medications: I have reviewed the patient's current medications.  Current Outpatient Prescriptions  Medication Sig Dispense Refill  . calcium carbonate (OS-CAL) 600 MG TABS tablet Take 600 mg by mouth daily.     . fluoruracil (CARAC) 0.5 % cream Apply topically 2 (two) times daily. Reported on 08/08/2015    . losartan (COZAAR) 50 MG tablet Take 50 mg by mouth daily.    . Multiple Vitamin (MULTIVITAMIN WITH MINERALS) TABS tablet Take 1 tablet by mouth daily.    . traZODone (DESYREL) 50 MG tablet TK 1 T PO QD HS PRN    . Vitamin D, Ergocalciferol, (DRISDOL) 50000 UNITS CAPS Take 50,000 Units by mouth every 7 (seven) days. Wednesday.     No current facility-administered medications for this visit.      Allergies:  Allergies  Allergen Reactions  . Levofloxacin Other (See Comments)    Red streaks to IV site when IV dose infusing and arm feeling very irritated, throbbing, painful  . Tape     Can use paper tape, adhesive causes redness    Past Medical History, Surgical history, Social history, and Family History were reviewed and updated.   Physical Exam: Blood pressure (!) 153/119, pulse 92, temperature 98 F (36.7 C), temperature source Oral, resp. rate 18, height 5' 4.5" (1.638 m), weight 146 lb 3.2 oz (66.3 kg), last menstrual period 07/30/1995, SpO2 100 %.  ECOG: 0 General appearance: Alert, awake woman without distress. Head: Normocephalic, without obvious abnormality no oral thrush. Neck: no adenopathy Lymph nodes: Cervical, supraclavicular, and axillary nodes normal. Heart:regular rate and rhythm, S1, S2 normal, no  murmur, click, rub or gallop Lung:chest clear, no wheezing, rales, normal symmetric air entry Abdomin: soft, non-tender, without masses or organomegaly no rebound or guarding. EXT:no erythema, induration, or nodules Skin: No rashes or lesions.   Lab Results: Lab Results  Component Value Date   WBC 6.6 03/12/2017   HGB 12.9 03/12/2017    HCT 39.2 03/12/2017   MCV 86.6 03/12/2017   PLT 218 03/12/2017     Chemistry      Component Value Date/Time   NA 142 03/12/2017 1116   K 4.2 03/12/2017 1116   CL 97 09/28/2012 0231   CL 100 03/18/2012 1422   CO2 25 03/12/2017 1116   BUN 18.8 03/12/2017 1116   CREATININE 0.8 03/12/2017 1116      Component Value Date/Time   CALCIUM 9.7 03/12/2017 1116   ALKPHOS 71 03/12/2017 1116   AST 27 03/12/2017 1116   ALT 32 03/12/2017 1116   BILITOT 0.64 03/12/2017 1116     Results for Eileen [name] (MRN [id number]) as of 03/19/2017 10:41  Ref. Range 03/12/2017 11:16 03/12/2017 11:16  CA 27.29 Latest Ref Range: 0.0 - 38.6 U/mL  34.7  CEA Latest Units: ng/mL 1.5   CEA ([facility]) Latest Ref Range: 0.00 - 5.00 ng/mL 2.58      Impression and Plan:  71 year old woman with the following issues:  1. Colon cancer diagnosed in 2006 and she is status post surgical resection followed by adjuvant FOLFOX chemotherapy that concluded in December of 2006. Her initial staging at stage IIA.  She is currently on active surveillance without any evidence of recurrence. No further imaging is needed at this time and she will require colonoscopy surveillance. She is up-to-date at this time.  2. The breast cancer: She remains on Femara since 2001.  The risks and benefits of continuing this drug were discussed again. She is ready diagnosed with osteoporosis and this medication will likely exacerbated. She would like to continue for the time being.  3. Bone density: She does have osteoporosis and currently on Fosamax. She'll continue to have a repeat DEXA scans in the future.  4. Follow-up: Will be in 12 months sooner if needed to.  Zola Button, MD 8/22/201810:50 AM

## 2017-03-19 NOTE — Telephone Encounter (Signed)
Scheduled appt pe r8/22 los - Gave patient AVS and calender per los.  

## 2017-04-21 DIAGNOSIS — M19042 Primary osteoarthritis, left hand: Secondary | ICD-10-CM | POA: Diagnosis not present

## 2017-04-21 DIAGNOSIS — M19041 Primary osteoarthritis, right hand: Secondary | ICD-10-CM | POA: Diagnosis not present

## 2017-04-21 DIAGNOSIS — M79645 Pain in left finger(s): Secondary | ICD-10-CM | POA: Diagnosis not present

## 2017-04-29 DIAGNOSIS — Z23 Encounter for immunization: Secondary | ICD-10-CM | POA: Diagnosis not present

## 2017-04-29 DIAGNOSIS — F5101 Primary insomnia: Secondary | ICD-10-CM | POA: Diagnosis not present

## 2017-04-29 DIAGNOSIS — E7800 Pure hypercholesterolemia, unspecified: Secondary | ICD-10-CM | POA: Diagnosis not present

## 2017-04-29 DIAGNOSIS — E559 Vitamin D deficiency, unspecified: Secondary | ICD-10-CM | POA: Diagnosis not present

## 2017-04-29 DIAGNOSIS — I10 Essential (primary) hypertension: Secondary | ICD-10-CM | POA: Diagnosis not present

## 2017-05-05 MED FILL — VIT D2 1.25 MG (50,000 UNIT: 1.25 MG | 84 days supply | Qty: 12 | Fill #1

## 2017-05-21 MED FILL — LOSARTAN POTASSIUM 50 MG TA: 50 | 90 days supply | Qty: 90 | Fill #1

## 2017-05-29 DIAGNOSIS — L816 Other disorders of diminished melanin formation: Secondary | ICD-10-CM | POA: Diagnosis not present

## 2017-05-29 DIAGNOSIS — D2261 Melanocytic nevi of right upper limb, including shoulder: Secondary | ICD-10-CM | POA: Diagnosis not present

## 2017-05-29 DIAGNOSIS — L570 Actinic keratosis: Secondary | ICD-10-CM | POA: Diagnosis not present

## 2017-05-29 DIAGNOSIS — L723 Sebaceous cyst: Secondary | ICD-10-CM | POA: Diagnosis not present

## 2017-05-29 DIAGNOSIS — Z23 Encounter for immunization: Secondary | ICD-10-CM | POA: Diagnosis not present

## 2017-05-29 DIAGNOSIS — Z85828 Personal history of other malignant neoplasm of skin: Secondary | ICD-10-CM | POA: Diagnosis not present

## 2017-06-09 ENCOUNTER — Ambulatory Visit (INDEPENDENT_AMBULATORY_CARE_PROVIDER_SITE_OTHER): Payer: Medicare Other | Admitting: Physician Assistant

## 2017-06-09 ENCOUNTER — Encounter: Payer: Self-pay | Admitting: Physician Assistant

## 2017-06-09 VITALS — BP 116/74 | HR 88 | Ht 63.78 in | Wt 147.0 lb

## 2017-06-09 DIAGNOSIS — Z85038 Personal history of other malignant neoplasm of large intestine: Secondary | ICD-10-CM

## 2017-06-09 DIAGNOSIS — R1314 Dysphagia, pharyngoesophageal phase: Secondary | ICD-10-CM | POA: Diagnosis not present

## 2017-06-09 DIAGNOSIS — Z8719 Personal history of other diseases of the digestive system: Secondary | ICD-10-CM

## 2017-06-09 DIAGNOSIS — R12 Heartburn: Secondary | ICD-10-CM

## 2017-06-09 NOTE — Progress Notes (Signed)
Chief Complaint: Heartburn, dysphagia, history of Barrett's esophagus  HPI:    [name] (pronounced "Rayz") is a 71 year old Caucasian female with a past medical history of colon cancer and Barrett's esophagus, as well as others listed below, who was referred to me by Shirline Frees, MD for a complaint of heartburn, dysphagia and history of Barrett's esophagus.      Patient's last EGD and colonoscopy were performed 10/11/14.  Colonoscopy was normal recommend patient have repeat in 5 years.  EGD revealed Barrett's esophagus with no dysplasia and a small reducible hiatal hernia.  Repeat was recommended in 3 years.    Today, the patient presents clinic and tells me that she has heartburn on a daily basis.  This is somewhat better after the patient started taking apple cider vinegar in her water in the mornings and has now started doing it at night as well.  The patient is resistant to starting any medications if she can "just control this with apple cider vinegar".  Patient does tell me, and bring with her the recommendations for repeat EGD in 3 years after her last, which will be March of next year.  Patient also describes that she does have occasional trouble swallowing and feels as though foods get hung up in her throat.  They do eventually go down.  She thinks this may be because "my throat is dry sometimes or the food is dry".  Patient is somewhat anxious regarding her history of multiple cancers in the past.    Patient also describes occasional right lower quadrant abdominal pain, which has gone away since she made the appointment a couple of weeks ago.  She tells me this is "probably because I had surgery down there".  Patient also describes that she typically has urgent solid bowel movements every other day or so.  This has been her normal since time of her colon surgery.  She has had no change recently and seen no bright red blood or black tarry sticky stools.  She has had no weight loss.  Patient denies fever, chills, fatigue, anorexia, change in bowel habits, nausea, vomiting or symptoms that awaken her at night.  Past Medical History:  Diagnosis Date  . Anemia   . Axillary mass 7/99   Left  . Barrett's esophagus   . Blood transfusion without reported diagnosis   . CARCINOMA, BREAST 1995   left  . Cervical spinal stenosis   . 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  . Headache(784.0)   . HEMORRHOIDS   . Hypertension     Past Surgical History:  Procedure Laterality Date  . BONE MARROW TRANSPLANT  1999  . BREAST LUMPECTOMY  11/95   left breast  . BUNIONECTOMY  3/09, 10/09   right and left  . COLON SURGERY    . COLONOSCOPY    . hemicolectomy  01/2005   right  . HYSTEROSCOPY  08/30/94  . INGUINAL HERNIA REPAIR    . NECK MASS EXCISION Left 7/99  . ROTATOR CUFF REPAIR Right 09/08/13   full tear  . TOTAL ABDOMINAL HYSTERECTOMY W/ BILATERAL SALPINGOOPHORECTOMY  1996  . TUNNELED VENOUS CATHETER PLACEMENT  9/98    Current Outpatient Medications  Medication Sig Dispense Refill  . calcium carbonate (OS-CAL) 600 MG TABS tablet Take 600 mg by mouth daily.     . fluoruracil (CARAC) 0.5 % cream Apply topically 2 (two) times daily. Reported on 08/08/2015    . losartan (COZAAR) 50 MG tablet Take 50  mg by mouth daily.    . Multiple Vitamin (MULTIVITAMIN WITH MINERALS) TABS tablet Take 1 tablet by mouth daily.    . traZODone (DESYREL) 50 MG tablet TK 1 T PO QD HS PRN    . Vitamin D, Ergocalciferol, (DRISDOL) 50000 UNITS CAPS Take 50,000 Units by mouth every 7 (seven) days. Wednesday.     No current facility-administered medications for this visit.     Allergies as of 06/09/2017 - Review Complete 06/09/2017  Allergen Reaction Noted  . Levofloxacin Other (See Comments) 11/05/2011  . Tape  08/27/2013    Family History  Problem Relation Age of Onset  . Uterine cancer Mother   . Heart attack Father   . Colon cancer Neg Hx   . Esophageal cancer Neg Hx   . Stomach cancer Neg  Hx   . Rectal cancer Neg Hx     Social History   Socioeconomic History  . Marital status: Married    Spouse name: Not on file  . Number of children: Not on file  . Years of education: Not on file  . Highest education level: Not on file  Social Needs  . Financial resource strain: Not on file  . Food insecurity - worry: Not on file  . Food insecurity - inability: Not on file  . Transportation needs - medical: Not on file  . Transportation needs - non-medical: Not on file  Occupational History  . Not on file  Tobacco Use  . Smoking status: Former Smoker    Last attempt to quit: 12/20/1990    Years since quitting: 26.4  . Smokeless tobacco: Never Used  Substance and Sexual Activity  . Alcohol use: Yes    Alcohol/week: 1.2 oz    Types: 2 Standard drinks or equivalent per week    Comment: occasionally  . Drug use: No  . Sexual activity: No    Birth control/protection: Surgical    Comment: hysterectomy  Other Topics Concern  . Not on file  Social History Narrative  . Not on file    Review of Systems:    Constitutional: No weight loss, fever or chills Skin: No rash Cardiovascular: No chest pain Respiratory: No SOB Gastrointestinal: See HPI and otherwise negative Genitourinary: No dysuria  Neurological: No headache Musculoskeletal: No new muscle or joint pain Hematologic: No bleeding  Psychiatric: No history of depression or anxiety    Physical Exam:  Vital signs: BP 116/74 (BP Location: Left Arm, Patient Position: Sitting, Cuff Size: Normal)   Pulse 88   Ht 5' 3.78" (1.62 m) Comment: height measured without shoes  Wt 147 lb (66.7 kg)   LMP 07/30/1995   BMI 25.41 kg/m   Constitutional:   Pleasant Caucasian female appears to be in NAD, Well developed, Well nourished, alert and cooperative Head:  Normocephalic and atraumatic. Eyes:   PEERL, EOMI. No icterus. Conjunctiva pink. Ears:  Normal auditory acuity. Neck:  Supple Throat: Oral cavity and pharynx without  inflammation, swelling or lesion.  Respiratory: Respirations even and unlabored. Lungs clear to auscultation bilaterally.   No wheezes, crackles, or rhonchi.  Cardiovascular: Normal S1, S2. No MRG. Regular rate and rhythm. No peripheral edema, cyanosis or pallor.  Gastrointestinal:  Soft, nondistended, moderate epigastric ttp. No rebound or guarding. Normal bowel sounds. No appreciable masses or hepatomegaly. Rectal:  Not performed.  Msk:  Symmetrical without gross deformities. Without edema, no deformity or joint abnormality.  Neurologic:  Alert and  oriented x4;  grossly normal neurologically.  Skin:  Dry and intact without significant lesions or rashes. Psychiatric:  Demonstrates good judgement and reason without abnormal affect or behaviors.  MOST RECENT LABS AND IMAGING: CBC    Component Value Date/Time   WBC 6.6 03/12/2017 1116   WBC 8.5 09/28/2012 0231   RBC 4.53 03/12/2017 1116   RBC 4.53 09/28/2012 0231   HGB 12.9 03/12/2017 1116   HCT 39.2 03/12/2017 1116   PLT 218 03/12/2017 1116   MCV 86.6 03/12/2017 1116   MCH 28.5 03/12/2017 1116   MCH 28.5 09/28/2012 0231   MCHC 32.9 03/12/2017 1116   MCHC 33.5 09/28/2012 0231   RDW 13.3 03/12/2017 1116   LYMPHSABS 1.6 03/12/2017 1116   MONOABS 0.5 03/12/2017 1116   EOSABS 0.1 03/12/2017 1116   BASOSABS 0.0 03/12/2017 1116    CMP     Component Value Date/Time   NA 142 03/12/2017 1116   K 4.2 03/12/2017 1116   CL 97 09/28/2012 0231   CL 100 03/18/2012 1422   CO2 25 03/12/2017 1116   GLUCOSE 87 03/12/2017 1116   GLUCOSE 92 03/18/2012 1422   BUN 18.8 03/12/2017 1116   CREATININE 0.8 03/12/2017 1116   CALCIUM 9.7 03/12/2017 1116   PROT 6.5 03/12/2017 1116   ALBUMIN 3.6 03/12/2017 1116   AST 27 03/12/2017 1116   ALT 32 03/12/2017 1116   ALKPHOS 71 03/12/2017 1116   BILITOT 0.64 03/12/2017 1116   GFRNONAA 88 (L) 09/28/2012 0231   GFRAA >90 09/28/2012 0231    Assessment: 1.  History of Barrett's esophagus: Last EGD  2016 with no dysplasia, recommendations to repeat in 3 years 2.  Heartburn: Daily heartburn, patient does not wish to start medication for this 3.  Dysphagia: Recently towards foods, "occasionally"; concern for esophageal stricture versus ring versus web versus mass versus other 4.  History of colon cancer: Last colonoscopy in 2016, recommendations to repeat in 5 years  Plan: 1.  Scheduled patient for an EGD in the [place] with Dr. Silverio Decamp, risks, benefits, limitations and alternatives were discussed with the patient and she agrees to proceed. 2.  It was discussed with the patient that she should start a daily antacid or anti-secretory medication.  We discussed this in detail, especially with history of Barrett's esophagus.  Patient wishes to hold off on this until review of EGD above. 3.  Reviewed antireflux diet and lifestyle modifications. 4.  Reviewed anti-dysphagia measures. 5.  Patient to follow-up in clinic per recommendations from Dr. Silverio Decamp after time of procedure.  Ellouise Newer, PA-C [facility] 06/09/2017, 2:08 PM  Cc: Shirline Frees, MD

## 2017-06-09 NOTE — Patient Instructions (Addendum)
We have provided you with a Reflux handout.  You have been scheduled for an endoscopy. Please follow written instructions given to you at your visit today. If you use inhalers (even only as needed), please bring them with you on the day of your procedure.   If you are age 70 or older, your body mass index should be between 23-30. Your Body mass index is 25.41 kg/m. If this is out of the aforementioned range listed, please consider follow up with your Primary Care Provider.

## 2017-06-16 NOTE — Progress Notes (Signed)
Reviewed and agree with documentation and assessment and plan. K. Veena Nandigam , MD   

## 2017-08-04 MED FILL — VIT D2 1.25 MG (50,000 UNIT: 1.25 MG | 84 days supply | Qty: 12 | Fill #0

## 2017-08-04 MED FILL — LOSARTAN POTASSIUM 50 MG TA: 50 | 90 days supply | Qty: 90 | Fill #0

## 2017-08-05 ENCOUNTER — Other Ambulatory Visit: Payer: Self-pay

## 2017-08-05 ENCOUNTER — Ambulatory Visit (AMBULATORY_SURGERY_CENTER): Payer: Medicare Other | Admitting: Gastroenterology

## 2017-08-05 ENCOUNTER — Encounter: Payer: Self-pay | Admitting: Gastroenterology

## 2017-08-05 VITALS — BP 171/74 | HR 61 | Temp 98.0°F | Resp 11 | Ht 63.0 in | Wt 147.0 lb

## 2017-08-05 DIAGNOSIS — K2971 Gastritis, unspecified, with bleeding: Secondary | ICD-10-CM

## 2017-08-05 DIAGNOSIS — Z8719 Personal history of other diseases of the digestive system: Secondary | ICD-10-CM

## 2017-08-05 DIAGNOSIS — K3189 Other diseases of stomach and duodenum: Secondary | ICD-10-CM | POA: Diagnosis not present

## 2017-08-05 DIAGNOSIS — K2270 Barrett's esophagus without dysplasia: Secondary | ICD-10-CM | POA: Diagnosis not present

## 2017-08-05 MED ORDER — SODIUM CHLORIDE 0.9 % IV SOLN: 500.0000 mL | Freq: Once | INTRAVENOUS | Status: DC

## 2017-08-05 MED ORDER — OMEPRAZOLE 20 MG PO CPDR: 20.0000 mg | DELAYED_RELEASE_CAPSULE | Freq: Every day | ORAL | 3 refills | Status: DC

## 2017-08-05 MED ORDER — POLYETHYLENE GLYCOL 3350 17 G PO PACK: 17.0000 g | PACK | Freq: Every day | ORAL | 3 refills | Status: DC | PRN

## 2017-08-05 MED FILL — OMEPRAZOLE 20 MG CAP: 20 | 90 days supply | Qty: 90 | Fill #0

## 2017-08-05 MED FILL — POLYETHYLENE GLYCOL 3350: 15 days supply | Qty: 255 | Fill #0

## 2017-08-05 NOTE — Progress Notes (Signed)
Called to room to assist during endoscopic procedure.  Patient ID and intended procedure confirmed with present staff. Received instructions for my participation in the procedure from the performing physician.  

## 2017-08-05 NOTE — Patient Instructions (Signed)
YOU HAD AN ENDOSCOPIC PROCEDURE TODAY AT [facility]:   Refer to the procedure report that was given to you for any specific questions about what was found during the examination.  If the procedure report does not answer your questions, please call your gastroenterologist to clarify.  If you requested that your care partner not be given the details of your procedure findings, then the procedure report has been included in a sealed envelope for you to review at your convenience later.  YOU SHOULD EXPECT: Some feelings of bloating in the abdomen. Passage of more gas than usual.  Walking can help get rid of the air that was put into your GI tract during the procedure and reduce the bloating. If you had a lower endoscopy (such as a colonoscopy or flexible sigmoidoscopy) you may notice spotting of blood in your stool or on the toilet paper. If you underwent a bowel prep for your procedure, you may not have a normal bowel movement for a few days.  Please Note:  You might notice some irritation and congestion in your nose or some drainage.  This is from the oxygen used during your procedure.  There is no need for concern and it should clear up in a day or so.  SYMPTOMS TO REPORT IMMEDIATELY:   Following upper endoscopy (EGD)  Vomiting of blood or coffee ground material  New chest pain or pain under the shoulder blades  Painful or persistently difficult swallowing  New shortness of breath  Fever of 100F or higher  Black, tarry-looking stools  For urgent or emergent issues, a gastroenterologist can be reached at any hour by calling [phone].   DIET:  We do recommend a small meal at first, but then you may proceed to your regular diet.  Drink plenty of fluids but you should avoid alcoholic beverages for 24 hours.  ACTIVITY:  You should plan to take it easy for the rest of today and you should NOT DRIVE or use heavy machinery until tomorrow (because of the sedation medicines used  during the test).    FOLLOW UP: Our staff will call the number listed on your records the next business day following your procedure to check on you and address any questions or concerns that you may have regarding the information given to you following your procedure. If we do not reach you, we will leave a message.  However, if you are feeling well and you are not experiencing any problems, there is no need to return our call.  We will assume that you have returned to your regular daily activities without incident.  If any biopsies were taken you will be contacted by phone or by letter within the next 1-3 weeks.  Please call us at [phone] if you have not heard about the biopsies in 3 weeks.    SIGNATURES/CONFIDENTIALITY: You and/or your care partner have signed paperwork which will be entered into your electronic medical record.  These signatures attest to the fact that that the information above on your After Visit Summary has been reviewed and is understood.  Full responsibility of the confidentiality of this discharge information lies with you and/or your care-partner.  Gastritis information given.  Use Prilosec 20mg , daily.  Anti reflux information given. Use from now on.

## 2017-08-05 NOTE — Op Note (Signed)
[facility] Patient Name: Eileen [name] Procedure Date: 08/05/2017 1:17 PM MRN: [id number] Endoscopist: Mauri Pole , MD Age: 72 Referring MD:  Date of Birth: [birth year] Gender: Female Account #: [id number] Procedure:                Upper GI endoscopy Indications:              Dysphagia Medicines:                Monitored Anesthesia Care Procedure:                Pre-Anesthesia Assessment:                           - Prior to the procedure, a History and Physical                            was performed, and patient medications and                            allergies were reviewed. The patient's tolerance of                            previous anesthesia was also reviewed. The risks                            and benefits of the procedure and the sedation                            options and risks were discussed with the patient.                            All questions were answered, and informed consent                            was obtained. Prior Anticoagulants: The patient has                            taken no previous anticoagulant or antiplatelet                            agents. ASA Grade Assessment: II - A patient with                            mild systemic disease. After reviewing the risks                            and benefits, the patient was deemed in                            satisfactory condition to undergo the procedure.                           After obtaining informed consent, the endoscope was  passed under direct vision. Throughout the                            procedure, the patient's blood pressure, pulse, and                            oxygen saturations were monitored continuously. The                            Endoscope was introduced through the mouth, and                            advanced to the second part of duodenum. The upper                            GI endoscopy was accomplished without  difficulty.                            The patient tolerated the procedure well. Scope In: Scope Out: Findings:                 No endoscopic abnormality was evident in the                            esophagus to explain the patient's complaint of                            dysphagia.                           Esophagogastric landmarks were identified: the                            Z-line was found at 38 cm from the incisors.                            Regular Z-line with no evidence of Barrett's                            esophagus                           LA Grade A (one or more mucosal breaks less than 5                            mm, not extending between tops of 2 mucosal folds)                            esophagitis with no bleeding was found 38 to 39 cm                            from the incisors.                           Patchy moderate inflammation with hemorrhage  characterized by congestion (edema), erosions,                            erythema and mucus was found in the gastric antrum.                            Biopsies were taken with a cold forceps for                            Helicobacter pylori testing.                           The examined duodenum was normal. Complications:            No immediate complications. Estimated Blood Loss:     Estimated blood loss was minimal. Impression:               - No endoscopic esophageal abnormality to explain                            patient's dysphagia.                           - Esophagogastric landmarks identified.                           - LA Grade A erosive esophagitis.                           - Gastritis with hemorrhage. Biopsied.                           - Normal examined duodenum. Recommendation:           - Patient has a contact number available for                            emergencies. The signs and symptoms of potential                            delayed complications were  discussed with the                            patient. Return to normal activities tomorrow.                            Written discharge instructions were provided to the                            patient.                           - Resume previous diet.                           - Continue present medications.                           -  Await pathology results.                           - Use Prilosec (omeprazole) 20 mg PO daily.                           - Follow an antireflux regimen for the rest of the                            patient's life. Mauri Pole, MD 08/05/2017 1:35:54 PM This report has been signed electronically.

## 2017-08-05 NOTE — Progress Notes (Signed)
Spontaneous respirations throughout. VSS. Resting comfortably. To PACU on room air. Report to  RN. 

## 2017-08-06 ENCOUNTER — Telehealth: Payer: Self-pay | Admitting: *Deleted

## 2017-08-06 NOTE — Telephone Encounter (Signed)
  Follow up Call-  Call back number 08/05/2017  Post procedure Call Back phone  # [phone]  Permission to leave phone message Yes  Some recent data might be hidden     Patient questions:  Do you have a fever, pain , or abdominal swelling? No. Pain Score  0 *  Have you tolerated food without any problems? Yes.    Have you been able to return to your normal activities? Yes.    Do you have any questions about your discharge instructions: Diet   No. Medications  No. Follow up visit  No.  Do you have questions or concerns about your Care? No.  Actions: * If pain score is 4 or above: No action needed, pain <4.

## 2017-08-13 ENCOUNTER — Encounter: Payer: Self-pay | Admitting: Gastroenterology

## 2017-09-04 DIAGNOSIS — M25511 Pain in right shoulder: Secondary | ICD-10-CM | POA: Diagnosis not present

## 2017-09-04 DIAGNOSIS — M19011 Primary osteoarthritis, right shoulder: Secondary | ICD-10-CM | POA: Diagnosis not present

## 2017-09-04 DIAGNOSIS — M19111 Post-traumatic osteoarthritis, right shoulder: Secondary | ICD-10-CM | POA: Diagnosis not present

## 2017-09-04 DIAGNOSIS — M25512 Pain in left shoulder: Secondary | ICD-10-CM | POA: Diagnosis not present

## 2017-09-04 DIAGNOSIS — G8929 Other chronic pain: Secondary | ICD-10-CM | POA: Diagnosis not present

## 2017-09-04 DIAGNOSIS — M7542 Impingement syndrome of left shoulder: Secondary | ICD-10-CM | POA: Diagnosis not present

## 2017-09-04 DIAGNOSIS — M19012 Primary osteoarthritis, left shoulder: Secondary | ICD-10-CM | POA: Diagnosis not present

## 2017-09-11 ENCOUNTER — Other Ambulatory Visit: Payer: Self-pay | Admitting: Family Medicine

## 2017-09-11 DIAGNOSIS — Z139 Encounter for screening, unspecified: Secondary | ICD-10-CM

## 2017-09-26 DIAGNOSIS — D485 Neoplasm of uncertain behavior of skin: Secondary | ICD-10-CM | POA: Diagnosis not present

## 2017-09-26 DIAGNOSIS — D0472 Carcinoma in situ of skin of left lower limb, including hip: Secondary | ICD-10-CM | POA: Diagnosis not present

## 2017-09-30 ENCOUNTER — Telehealth: Payer: Self-pay

## 2017-09-30 DIAGNOSIS — H2513 Age-related nuclear cataract, bilateral: Secondary | ICD-10-CM | POA: Diagnosis not present

## 2017-09-30 DIAGNOSIS — H04123 Dry eye syndrome of bilateral lacrimal glands: Secondary | ICD-10-CM | POA: Diagnosis not present

## 2017-09-30 DIAGNOSIS — H353131 Nonexudative age-related macular degeneration, bilateral, early dry stage: Secondary | ICD-10-CM | POA: Diagnosis not present

## 2017-09-30 NOTE — Telephone Encounter (Signed)
Patient showed up in lobby. Requesting earlier appt, scheduled to see Dr. Alen Blew 8/22. She is concerned about x 2 areas that she had removed from her leg at the dermatologist that was skin cancer in September 2018. Scheduling message sent for earlier appt.

## 2017-10-01 ENCOUNTER — Telehealth: Payer: Self-pay

## 2017-10-01 DIAGNOSIS — M19111 Post-traumatic osteoarthritis, right shoulder: Secondary | ICD-10-CM | POA: Diagnosis not present

## 2017-10-01 NOTE — Telephone Encounter (Signed)
Printed avs and calender of upcoming appointment. Per 3/6 los 

## 2017-10-03 ENCOUNTER — Telehealth: Payer: Self-pay | Admitting: Oncology

## 2017-10-03 NOTE — Telephone Encounter (Signed)
Returned call to patient as she was requesting to reschedule 3/26 lab appointment to later.  Appointment moved and left message on voicemail for patient with new date/time per 3/8 phone msg

## 2017-10-10 ENCOUNTER — Other Ambulatory Visit: Payer: Medicare Other

## 2017-10-10 ENCOUNTER — Ambulatory Visit: Payer: Medicare Other | Admitting: Hematology

## 2017-10-14 MED FILL — OMEPRAZOLE 20 MG CAP: 20 | 90 days supply | Qty: 90 | Fill #1

## 2017-10-15 ENCOUNTER — Ambulatory Visit
Admission: RE | Admit: 2017-10-15 | Discharge: 2017-10-15 | Disposition: A | Discharge status: Home / Self Care | Payer: Medicare Other | Source: Ambulatory Visit | Attending: Family Medicine | Admitting: Family Medicine

## 2017-10-15 DIAGNOSIS — Z1231 Encounter for screening mammogram for malignant neoplasm of breast: Secondary | ICD-10-CM | POA: Diagnosis not present

## 2017-10-15 DIAGNOSIS — Z139 Encounter for screening, unspecified: Secondary | ICD-10-CM

## 2017-10-15 HISTORY — DX: Personal history of antineoplastic chemotherapy: Z92.21

## 2017-10-15 HISTORY — DX: Personal history of irradiation: Z92.3

## 2017-10-16 MED FILL — LOSARTAN POTASSIUM 50 MG TA: 50 | 31 days supply | Qty: 31 | Fill #0

## 2017-10-17 DIAGNOSIS — Z1502 Genetic susceptibility to malignant neoplasm of ovary: Secondary | ICD-10-CM | POA: Diagnosis not present

## 2017-10-17 DIAGNOSIS — Z8059 Family history of malignant neoplasm of other urinary tract organ: Secondary | ICD-10-CM | POA: Diagnosis not present

## 2017-10-17 DIAGNOSIS — Z1504 Genetic susceptibility to malignant neoplasm of endometrium: Secondary | ICD-10-CM | POA: Diagnosis not present

## 2017-10-17 DIAGNOSIS — Z853 Personal history of malignant neoplasm of breast: Secondary | ICD-10-CM | POA: Diagnosis not present

## 2017-10-17 DIAGNOSIS — Z8041 Family history of malignant neoplasm of ovary: Secondary | ICD-10-CM | POA: Diagnosis not present

## 2017-10-17 DIAGNOSIS — C50912 Malignant neoplasm of unspecified site of left female breast: Secondary | ICD-10-CM | POA: Diagnosis not present

## 2017-10-17 DIAGNOSIS — Z85038 Personal history of other malignant neoplasm of large intestine: Secondary | ICD-10-CM | POA: Diagnosis not present

## 2017-10-17 DIAGNOSIS — Z1509 Genetic susceptibility to other malignant neoplasm: Secondary | ICD-10-CM | POA: Diagnosis not present

## 2017-10-20 ENCOUNTER — Encounter: Payer: Self-pay | Admitting: *Deleted

## 2017-10-21 ENCOUNTER — Other Ambulatory Visit: Payer: Medicare Other

## 2017-10-21 ENCOUNTER — Other Ambulatory Visit: Payer: Self-pay | Admitting: Oncology

## 2017-10-21 ENCOUNTER — Inpatient Hospital Stay: Payer: Medicare Other | Attending: Oncology

## 2017-10-21 DIAGNOSIS — C189 Malignant neoplasm of colon, unspecified: Secondary | ICD-10-CM | POA: Diagnosis not present

## 2017-10-21 LAB — CBC WITH DIFFERENTIAL (CANCER CENTER ONLY)
Basophils Absolute: 0 10*3/uL (ref 0.0–0.1)
Basophils Relative: 0 %
Eosinophils Absolute: 0 10*3/uL (ref 0.0–0.5)
Eosinophils Relative: 1 %
HCT: 39.6 % (ref 34.8–46.6)
Hemoglobin: 12.9 g/dL (ref 11.6–15.9)
Lymphocytes Relative: 22 %
Lymphs Abs: 1.6 10*3/uL (ref 0.9–3.3)
MCH: 28.7 pg (ref 25.1–34.0)
MCHC: 32.6 g/dL (ref 31.5–36.0)
MCV: 88.2 fL (ref 79.5–101.0)
Monocytes Absolute: 0.6 10*3/uL (ref 0.1–0.9)
Monocytes Relative: 8 %
Neutro Abs: 5 10*3/uL (ref 1.5–6.5)
Neutrophils Relative %: 69 %
Platelet Count: 215 10*3/uL (ref 145–400)
RBC: 4.49 MIL/uL (ref 3.70–5.45)
RDW: 13.2 % (ref 11.2–14.5)
WBC Count: 7.2 10*3/uL (ref 3.9–10.3)

## 2017-10-21 LAB — CMP (CANCER CENTER ONLY)
ALT: 27 U/L (ref 0–55)
AST: 23 U/L (ref 5–34)
Albumin: 3.7 g/dL (ref 3.5–5.0)
Alkaline Phosphatase: 63 U/L (ref 40–150)
Anion gap: 7 (ref 3–11)
BUN: 17 mg/dL (ref 7–26)
CO2: 27 mmol/L (ref 22–29)
Calcium: 9.5 mg/dL (ref 8.4–10.4)
Chloride: 108 mmol/L (ref 98–109)
Creatinine: 0.81 mg/dL (ref 0.60–1.10)
GFR, Est AFR Am: >60 mL/min (ref >60)
GFR, Estimated: >60 mL/min (ref >60)
Glucose, Bld: 83 mg/dL (ref 70–140)
Potassium: 4.2 mmol/L (ref 3.5–5.1)
Sodium: 142 mmol/L (ref 136–145)
Total Bilirubin: 0.7 mg/dL (ref 0.2–1.2)
Total Protein: 6.4 g/dL (ref 6.4–8.3)

## 2017-10-21 LAB — CEA (IN HOUSE-CHCC): CEA (CHCC-In House): 3.26 ng/mL (ref 0.00–5.00)

## 2017-10-28 ENCOUNTER — Inpatient Hospital Stay: Payer: Medicare Other | Attending: Oncology | Admitting: Oncology

## 2017-10-28 ENCOUNTER — Telehealth: Payer: Self-pay | Admitting: Oncology

## 2017-10-28 VITALS — BP 141/90 | HR 77 | Temp 97.6°F | Resp 18 | Ht 63.0 in | Wt 145.8 lb

## 2017-10-28 DIAGNOSIS — C189 Malignant neoplasm of colon, unspecified: Secondary | ICD-10-CM

## 2017-10-28 DIAGNOSIS — Z79899 Other long term (current) drug therapy: Secondary | ICD-10-CM | POA: Diagnosis not present

## 2017-10-28 DIAGNOSIS — M810 Age-related osteoporosis without current pathological fracture: Secondary | ICD-10-CM | POA: Insufficient documentation

## 2017-10-28 DIAGNOSIS — Z853 Personal history of malignant neoplasm of breast: Secondary | ICD-10-CM | POA: Insufficient documentation

## 2017-10-28 DIAGNOSIS — Z85030 Personal history of malignant carcinoid tumor of large intestine: Secondary | ICD-10-CM | POA: Diagnosis not present

## 2017-10-28 DIAGNOSIS — Z85038 Personal history of other malignant neoplasm of large intestine: Secondary | ICD-10-CM | POA: Insufficient documentation

## 2017-10-28 DIAGNOSIS — Z9484 Stem cells transplant status: Secondary | ICD-10-CM | POA: Diagnosis not present

## 2017-10-28 NOTE — Telephone Encounter (Signed)
Scheduled appt per 4/2 los - scheduled two weeks before April 2020 per patient preference . Gave patient aVS and calender per los.

## 2017-10-28 NOTE — Progress Notes (Signed)
Hematology and Oncology Follow Up Visit  Eileen [name] [id number] [birth year] 72 y.o. 10/28/2017 10:30 AM Eileen [name], MDHarris, Eileen Saxon, MD   Principle Diagnosis:  72 year old woman with:  1.  History of breast cancer diagnosed in 1995.  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 2. Colon cancer stage IIA diagnosed in 2006.  She remains in remission after definitive therapy in 2006.  Prior Therapy: 1. She is status post lumpectomy and lymph node dissection followed by [facility] and radiation therapy. 2. She is status post 4 cycles of Adriamycin and Taxotere followed by stem cell transplantation at [facility] on 08/16/1997 due to recurrence and the left supraclavicular lymph node. Her tumor is ER negative PR positive. 3. She is status post right colectomy for stage IIA adenocarcinoma of the colon in October of 2006. She is status post 10 cycles of FOLFOX concluded in 06/2005 4. Femara taken between 2001 and 2017.   Current therapy: Active surveillance.  Interim History:  Eileen [name] is here for a follow-up visit.  She reports no major changes since her last visit.  She did have skin cancer that was diagnosed by her dermatologist and removed surgically..  Final pathology is not available to me but appeared to be either squamous cell carcinoma and not melanoma.  She denied any constitutional symptoms or decline in her energy.  Her appetite remain excellent and performance status remains about the same.  She continues to be active and attends activities of daily living.  She continues to have periodic changes in her bowel habits including alternating constipation and diarrhea that has been chronic in nature.  She does not report any headaches or blurry vision.  She denies any fevers, chills, sweats or weight loss.  She does not report any chest pain or shortness of breath. Does not report any cough or hemoptysis. She does not report any nausea, vomiting or change in her bowel  habits. She does not report any hematochezia or melena. Does not report any lymphadenopathy or petechiae.  She denies any skin rashes or lesions.  Rest of her review of systems is negative.  Medications: I have reviewed the patient's current medications.  Current Outpatient Medications  Medication Sig Dispense Refill  . calcium carbonate (OS-CAL) 600 MG TABS tablet Take 600 mg by mouth daily.     . fluoruracil (CARAC) 0.5 % cream Apply topically 2 (two) times daily. Reported on 08/08/2015    . losartan (COZAAR) 50 MG tablet Take 50 mg by mouth daily.    . Multiple Vitamin (MULTIVITAMIN WITH MINERALS) TABS tablet Take 1 tablet by mouth daily.    Marland Kitchen omeprazole (PRILOSEC) 20 MG capsule Take 1 capsule (20 mg total) by mouth daily. 90 capsule 3  . polyethylene glycol (MIRALAX) packet Take 17 g by mouth daily as needed. 100 each 3  . traZODone (DESYREL) 50 MG tablet TK 1 T PO QD HS PRN    . vitamin C (ASCORBIC ACID) 500 MG tablet Take by mouth.    . Vitamin D, Ergocalciferol, (DRISDOL) 50000 UNITS CAPS Take 50,000 Units by mouth every 7 (seven) days. Wednesday.    . vitamin E 100 UNIT capsule Take by mouth daily.     Current Facility-Administered Medications  Medication Dose Route Frequency Provider Last Rate Last Dose  . 0.9 %  sodium chloride infusion  500 mL Intravenous Once Mauri Pole, MD         Allergies:  Allergies  Allergen Reactions  . Levofloxacin Other (  See Comments)    Red streaks to IV site when IV dose infusing and arm feeling very irritated, throbbing, painful  . Tape     Can use paper tape, adhesive causes redness    Past Medical History, Surgical history, Social history, and Family History reviewed and remain unchanged.   Physical Exam: Blood pressure (!) 141/90, pulse 77, temperature 97.6 F (36.4 C), temperature source Oral, resp. rate 18, height 5\' 3"  (1.6 m), weight 145 lb 12.8 oz (66.1 kg), last menstrual period 07/30/1995, SpO2 100 %.   ECOG: 0 General  appearance: Well-appearing woman appeared without distress. Head: No abnormalities or trauma noted. Eyes: No scleral icterus. Oropharynx: Without any thrush or ulcers Lymph nodes: Cervical, supraclavicular, and axillary nodes normal. Heart: Regular rate and rhythm without any murmurs or gallops. Lung: Clear to auscultation without any rhonchi, wheezes or dullness to percussion. Abdomin: Soft, nontender without any rebound or guarding. Musculoskeletal: No joint deformity or effusion.  No clubbing or cyanosis.  Good morning Skin: No rashes or lesions. Well-healed scar noted on her left leg.  No erythema or induration.  Lab Results: Lab Results  Component Value Date   WBC 7.2 10/21/2017   HGB 12.9 03/12/2017   HCT 39.6 10/21/2017   MCV 88.2 10/21/2017   PLT 215 10/21/2017     Chemistry      Component Value Date/Time   NA 142 10/21/2017 0954   NA 142 03/12/2017 1116   K 4.2 10/21/2017 0954   K 4.2 03/12/2017 1116   CL 108 10/21/2017 0954   CL 100 03/18/2012 1422   CO2 27 10/21/2017 0954   CO2 25 03/12/2017 1116   BUN 17 10/21/2017 0954   BUN 18.8 03/12/2017 1116   CREATININE 0.81 10/21/2017 0954   CREATININE 0.8 03/12/2017 1116      Component Value Date/Time   CALCIUM 9.5 10/21/2017 0954   CALCIUM 9.7 03/12/2017 1116   ALKPHOS 63 10/21/2017 0954   ALKPHOS 71 03/12/2017 1116   AST 23 10/21/2017 0954   AST 27 03/12/2017 1116   ALT 27 10/21/2017 0954   ALT 32 03/12/2017 1116   BILITOT 0.7 10/21/2017 0954   BILITOT 0.64 03/12/2017 1116         Impression and Plan:  72 year old woman with:   1.  Stage IIA colon cancer diagnosed in 2006.  She remains disease-free after surgical resection and adjuvant chemotherapy concluded in December 2006.    Laboratory data and physical exam do not suggest any recurrent disease.  She is up-to-date on colonoscopies.  He did have an endoscopy in January 2019 with pathology revealed no abnormalities or H. Pylori.  The plan is to  continue with active surveillance with visits to annually.   2. Breast cancer: She is status post definitive therapy outlined above and remains in remission since that time.  She took from our between 2001 and 2017.  She is up-to-date on her mammography and examination.  No evidence to suggest recurrent disease.  3.  Osteoporosis: She remains on Fosamax for osteoporosis prevention.  4.  Skin cancer: We discussed strategies to minimize her skin exposure and to continue to follow with dermatology.  5. Follow-up: Will be in 12 months sooner if needed to.  15  minutes was spent with the patient face-to-face today.  More than 50% of time was dedicated to patient counseling, education and coordination of her care.   Zola Button, MD 4/2/201910:30 AM

## 2017-10-29 ENCOUNTER — Other Ambulatory Visit: Payer: Self-pay | Admitting: Family Medicine

## 2017-10-29 DIAGNOSIS — Z1159 Encounter for screening for other viral diseases: Secondary | ICD-10-CM | POA: Diagnosis not present

## 2017-10-29 DIAGNOSIS — L570 Actinic keratosis: Secondary | ICD-10-CM | POA: Diagnosis not present

## 2017-10-29 DIAGNOSIS — M545 Low back pain: Secondary | ICD-10-CM | POA: Diagnosis not present

## 2017-10-29 DIAGNOSIS — D0472 Carcinoma in situ of skin of left lower limb, including hip: Secondary | ICD-10-CM | POA: Diagnosis not present

## 2017-10-29 DIAGNOSIS — R946 Abnormal results of thyroid function studies: Secondary | ICD-10-CM | POA: Diagnosis not present

## 2017-10-29 DIAGNOSIS — F5101 Primary insomnia: Secondary | ICD-10-CM | POA: Diagnosis not present

## 2017-10-29 DIAGNOSIS — I10 Essential (primary) hypertension: Secondary | ICD-10-CM | POA: Diagnosis not present

## 2017-10-29 DIAGNOSIS — Z85038 Personal history of other malignant neoplasm of large intestine: Secondary | ICD-10-CM | POA: Diagnosis not present

## 2017-10-29 DIAGNOSIS — K219 Gastro-esophageal reflux disease without esophagitis: Secondary | ICD-10-CM | POA: Diagnosis not present

## 2017-10-29 DIAGNOSIS — Z853 Personal history of malignant neoplasm of breast: Secondary | ICD-10-CM | POA: Diagnosis not present

## 2017-10-29 DIAGNOSIS — E7800 Pure hypercholesterolemia, unspecified: Secondary | ICD-10-CM | POA: Diagnosis not present

## 2017-10-29 DIAGNOSIS — L219 Seborrheic dermatitis, unspecified: Secondary | ICD-10-CM | POA: Diagnosis not present

## 2017-10-29 DIAGNOSIS — B078 Other viral warts: Secondary | ICD-10-CM | POA: Diagnosis not present

## 2017-10-29 DIAGNOSIS — Z1211 Encounter for screening for malignant neoplasm of colon: Secondary | ICD-10-CM | POA: Diagnosis not present

## 2017-10-29 DIAGNOSIS — M859 Disorder of bone density and structure, unspecified: Secondary | ICD-10-CM

## 2017-10-29 DIAGNOSIS — E559 Vitamin D deficiency, unspecified: Secondary | ICD-10-CM | POA: Diagnosis not present

## 2017-10-29 DIAGNOSIS — Z0000 Encounter for general adult medical examination without abnormal findings: Secondary | ICD-10-CM | POA: Diagnosis not present

## 2017-10-29 MED FILL — LOSARTAN POTASSIUM 50 MG TA: 50 | 90 days supply | Qty: 180 | Fill #0

## 2017-10-29 MED FILL — CLOTRIMAZOLE-BETAMETHASONE: 1-0.05 | 14 days supply | Qty: 15 | Fill #0

## 2017-10-29 MED FILL — VIT D2 1.25 MG (50,000 UNIT: 1.25 MG | 84 days supply | Qty: 12 | Fill #0

## 2017-11-05 DIAGNOSIS — Z9484 Stem cells transplant status: Secondary | ICD-10-CM | POA: Diagnosis not present

## 2017-11-05 DIAGNOSIS — C4492 Squamous cell carcinoma of skin, unspecified: Secondary | ICD-10-CM | POA: Diagnosis not present

## 2017-11-05 DIAGNOSIS — Z85038 Personal history of other malignant neoplasm of large intestine: Secondary | ICD-10-CM | POA: Diagnosis not present

## 2017-11-05 DIAGNOSIS — Z8719 Personal history of other diseases of the digestive system: Secondary | ICD-10-CM | POA: Diagnosis not present

## 2017-11-05 DIAGNOSIS — Z853 Personal history of malignant neoplasm of breast: Secondary | ICD-10-CM | POA: Diagnosis not present

## 2017-11-06 ENCOUNTER — Encounter: Payer: Self-pay | Admitting: Obstetrics & Gynecology

## 2017-11-06 ENCOUNTER — Ambulatory Visit (INDEPENDENT_AMBULATORY_CARE_PROVIDER_SITE_OTHER): Payer: Medicare Other | Admitting: Obstetrics & Gynecology

## 2017-11-06 ENCOUNTER — Encounter: Payer: Self-pay | Admitting: *Deleted

## 2017-11-06 VITALS — BP 146/90 | HR 64 | Resp 16 | Ht 64.25 in | Wt 142.0 lb

## 2017-11-06 DIAGNOSIS — Z124 Encounter for screening for malignant neoplasm of cervix: Secondary | ICD-10-CM

## 2017-11-06 DIAGNOSIS — I10 Essential (primary) hypertension: Secondary | ICD-10-CM | POA: Insufficient documentation

## 2017-11-06 DIAGNOSIS — C50919 Malignant neoplasm of unspecified site of unspecified female breast: Secondary | ICD-10-CM

## 2017-11-06 DIAGNOSIS — Z01419 Encounter for gynecological examination (general) (routine) without abnormal findings: Secondary | ICD-10-CM | POA: Diagnosis not present

## 2017-11-06 NOTE — Progress Notes (Signed)
72 y.o. G3P2 MarriedCaucasianF here for annual exam.  Doing well.  Had endoscopy with Dr. Silverio Decamp due to Barrett's esophagus.  Still has some shoulder issues.  Not interested in shoulder replacement.  This has been an ongoing issue for her.  Surgery was done in 2015 at [facility].  Saw Dr. Quay Burow in 2/19.    Denies vaginal bleeding.    PCP:  Dr. Kenton Kingfisher.  Last appt was 10/29/17.  Has yearly exam then.  Blood work was done then.  Hasn't gotten results yet.  Patient's last menstrual period was 07/30/1995.          Sexually active: No.  The current method of family planning is status post hysterectomy.    Exercising: Yes.    walking, running Smoker:  no  Health Maintenance: Pap:  08/08/16 Neg   05/30/15 Neg  History of abnormal Pap:  no MMG:  10/15/17 BIRADS1:Neg  Colonoscopy:  10/11/14 Normal. F/u 5 years  BMD:   11/07/15 Osteoporosis. Has appt 03/03/18 TDaP:  2013 Pneumonia vaccine(s):  Never Shingrix:   Never Hep C testing: No  Screening Labs: PCP   reports that she quit smoking about 26 years ago. She has never used smokeless tobacco. She reports that she drinks about 1.2 oz of alcohol per week. She reports that she does not use drugs.  Past Medical History:  Diagnosis Date  . Anemia   . Axillary mass 7/99   Left  . Barrett's esophagus   . Blood transfusion without reported diagnosis   . Breast cancer ([place])   . CARCINOMA, BREAST 1995   left  . Cervical spinal stenosis   . 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  . Headache(784.0)   . HEMORRHOIDS   . Hypertension   . Personal history of chemotherapy   . Personal history of radiation therapy     Past Surgical History:  Procedure Laterality Date  . BONE MARROW TRANSPLANT  1999  . BREAST LUMPECTOMY  11/95   left breast  . BUNIONECTOMY  3/09, 10/09   right and left  . COLON SURGERY    . COLONOSCOPY    . ERCP  01/21/2012   Procedure: ENDOSCOPIC RETROGRADE CHOLANGIOPANCREATOGRAPHY (ERCP);  Surgeon: Lafayette Dragon, MD;  Location: [facility];  Service: Endoscopy;  Laterality: [name];  . hemicolectomy  01/2005   right  . HYSTEROSCOPY  08/30/94  . INGUINAL HERNIA REPAIR    . NECK MASS EXCISION Left 7/99  . ROTATOR CUFF REPAIR Right 09/08/13   full tear  . TOTAL ABDOMINAL HYSTERECTOMY W/ BILATERAL SALPINGOOPHORECTOMY  1996  . TUNNELED VENOUS CATHETER PLACEMENT  9/98    Current Outpatient Medications  Medication Sig Dispense Refill  . calcium carbonate (OS-CAL) 600 MG TABS tablet Take 600 mg by mouth daily.     . clotrimazole-betamethasone (LOTRISONE) cream   0  . fluorometholone (FML) 0.1 % ophthalmic suspension as needed    . fluoruracil (CARAC) 0.5 % cream Apply topically 2 (two) times daily. Reported on 08/08/2015    . losartan (COZAAR) 50 MG tablet Take 50 mg by mouth daily.    . Multiple Vitamin (MULTIVITAMIN WITH MINERALS) TABS tablet Take 1 tablet by mouth daily.    Marland Kitchen omeprazole (PRILOSEC) 20 MG capsule Take 1 capsule (20 mg total) by mouth daily. 90 capsule 3  . traZODone (DESYREL) 50 MG tablet TK 1 T PO QD HS PRN    . vitamin B-12 (CYANOCOBALAMIN) 1000 MCG tablet Take by mouth daily.    Marland Kitchen  vitamin C (ASCORBIC ACID) 500 MG tablet Take by mouth.    . Vitamin D, Ergocalciferol, (DRISDOL) 50000 UNITS CAPS Take 50,000 Units by mouth every 7 (seven) days. Wednesday.    . vitamin E 100 UNIT capsule Take by mouth daily.     Current Facility-Administered Medications  Medication Dose Route Frequency Provider Last Rate Last Dose  . 0.9 %  sodium chloride infusion  500 mL Intravenous Once Nandigam, Venia Minks, MD        Family History  Problem Relation Age of Onset  . Uterine cancer Mother   . Heart attack Father   . Colon cancer Neg Hx   . Esophageal cancer Neg Hx   . Stomach cancer Neg Hx   . Rectal cancer Neg Hx     ROS  Exam:   BP (!) 146/90 (BP Location: Right Arm, Patient Position: Sitting, Cuff Size: Normal)   Pulse 64   Resp 16   Ht 5' 4.25" (1.632 m)   Wt 142 lb (64.4 kg)   LMP 07/30/1995   BMI  24.18 kg/m   Height:   Height: 5' 4.25" (163.2 cm)  Ht Readings from Last 3 Encounters:  11/06/17 5' 4.25" (1.632 m)  10/28/17 5\' 3"  (1.6 m)  08/05/17 5\' 3"  (1.6 m)    General appearance: alert, cooperative and appears stated age Head: Normocephalic, without obvious abnormality, atraumatic Neck: no adenopathy, supple, symmetrical, trachea midline and thyroid normal to inspection and palpation Lungs: clear to auscultation bilaterally Breasts:  Right breast without masses, skin changes, LAD; left breast with well healed scar and axillary scar, minimal radiation changes, no new masses/skin changes, LAD, chest wall changes Heart: regular rate and rhythm Abdomen: soft, non-tender; bowel sounds normal; no masses,  no organomegaly Extremities: extremities normal, atraumatic, no cyanosis or edema Skin: Skin color, texture, turgor normal. No rashes or lesions Lymph nodes: Cervical, supraclavicular, and axillary nodes normal. No abnormal inguinal nodes palpated Neurologic: Grossly normal   Pelvic: External genitalia:  no lesions              Urethra:  normal appearing urethra with no masses, tenderness or lesions              Bartholins and Skenes: normal                 Vagina: normal appearing vagina with normal color and discharge, no lesions              Cervix: absent              Pap taken: No. Bimanual Exam:  Uterus:  uterus absent              Adnexa: no mass, fullness, tenderness               Rectovaginal: Confirms               Anus:  normal sphincter tone, no lesions  Chaperone was present for exam.  A:  Well Woman with normal exam PMP, No HRT H/O TAH/BSO 1996 H/O colon cancer s/p hemicolectomy 7/06.  Most recent colonoscopy 2016 with Dr. Olevia Perches.  She advised repeat in five years. H/O breast cancer with recurrence 1995/1998 with clavicular mass s/p resection, s/p BMT H/O low Vit D Barrett's esophagus Hypertension Osteoporosis  P:   Mammogram guidelines reviewed.  Doing  yearly. pap smear not obtained today Lab work UTD Declines pneumonia vaccines or shingles vaccine BMD is scheduled for August return annually or  prn

## 2017-11-18 MED FILL — OMEPRAZOLE 20 MG CAP: 20 | 90 days supply | Qty: 90 | Fill #2

## 2017-11-18 NOTE — Telephone Encounter (Signed)
Error opening  

## 2018-02-23 MED FILL — OMEPRAZOLE 20 MG CPDR: 20 | 90 days supply | Qty: 90 | Fill #0

## 2018-02-23 MED FILL — VIT D2 1.25 MG (50,000 UNIT: 1.25 MG | 84 days supply | Qty: 12 | Fill #1

## 2018-02-23 MED FILL — LOSARTAN POTASSIUM 50 MG TA: 50 | 90 days supply | Qty: 180 | Fill #1

## 2018-03-03 ENCOUNTER — Ambulatory Visit
Admission: RE | Admit: 2018-03-03 | Discharge: 2018-03-03 | Disposition: A | Discharge status: Home / Self Care | Payer: Medicare Other | Source: Ambulatory Visit | Attending: Family Medicine | Admitting: Family Medicine

## 2018-03-03 DIAGNOSIS — Z780 Asymptomatic menopausal state: Secondary | ICD-10-CM | POA: Diagnosis not present

## 2018-03-03 DIAGNOSIS — M8589 Other specified disorders of bone density and structure, multiple sites: Secondary | ICD-10-CM | POA: Diagnosis not present

## 2018-03-03 DIAGNOSIS — M859 Disorder of bone density and structure, unspecified: Secondary | ICD-10-CM

## 2018-03-12 ENCOUNTER — Other Ambulatory Visit: Payer: Medicare Other

## 2018-03-17 ENCOUNTER — Other Ambulatory Visit: Payer: Self-pay | Admitting: Family Medicine

## 2018-03-17 ENCOUNTER — Ambulatory Visit
Admission: RE | Admit: 2018-03-17 | Discharge: 2018-03-17 | Disposition: A | Discharge status: Home / Self Care | Payer: Medicare Other | Source: Ambulatory Visit | Attending: Family Medicine | Admitting: Family Medicine

## 2018-03-17 DIAGNOSIS — M545 Low back pain, unspecified: Secondary | ICD-10-CM

## 2018-03-17 DIAGNOSIS — M25552 Pain in left hip: Secondary | ICD-10-CM | POA: Diagnosis not present

## 2018-03-17 DIAGNOSIS — M47816 Spondylosis without myelopathy or radiculopathy, lumbar region: Secondary | ICD-10-CM | POA: Diagnosis not present

## 2018-03-17 MED FILL — MELOXICAM 15 MG TABLET: 15 | 14 days supply | Qty: 14 | Fill #0

## 2018-03-19 ENCOUNTER — Ambulatory Visit: Payer: Medicare Other | Admitting: Oncology

## 2018-05-01 ENCOUNTER — Encounter: Payer: Self-pay | Admitting: Cardiology

## 2018-05-01 ENCOUNTER — Ambulatory Visit (INDEPENDENT_AMBULATORY_CARE_PROVIDER_SITE_OTHER): Payer: Medicare Other | Admitting: Cardiology

## 2018-05-01 VITALS — BP 162/92 | HR 83 | Ht 64.0 in | Wt 146.2 lb

## 2018-05-01 DIAGNOSIS — R0989 Other specified symptoms and signs involving the circulatory and respiratory systems: Secondary | ICD-10-CM | POA: Diagnosis not present

## 2018-05-01 DIAGNOSIS — I493 Ventricular premature depolarization: Secondary | ICD-10-CM | POA: Diagnosis not present

## 2018-05-01 DIAGNOSIS — R0602 Shortness of breath: Secondary | ICD-10-CM | POA: Diagnosis not present

## 2018-05-01 NOTE — Progress Notes (Signed)
Paschal Dopp, MD Reason for referral-palpitations  HPI: 72 year old female for evaluation of palpitations at request of Shirline Frees, MD.  Seen previously but not since October 11, 2011.  Echocardiogram 2007 normal as was nuclear study.  Carotid Doppler 2013 with no significant stenosis.  Echocardiogram April 2013 showed normal LV function.  Patient has recently noticed that her heartbeat is irregular when checking her blood pressure.  However there are no palpitations.  She occasionally has some dyspnea but typically exercises with no symptoms.  She denies chest pain or syncope.  No orthopnea, PND or pedal edema.  Because of the above cardiology asked to evaluate.  Current Outpatient Medications  Medication Sig Dispense Refill  . calcium carbonate (OS-CAL) 600 MG TABS tablet Take 600 mg by mouth daily.     . fluorometholone (FML) 0.1 % ophthalmic suspension as needed    . losartan (COZAAR) 50 MG tablet Take 50 mg by mouth daily.    . Multiple Vitamin (MULTIVITAMIN WITH MINERALS) TABS tablet Take 1 tablet by mouth daily.    Marland Kitchen omeprazole (PRILOSEC) 20 MG capsule Take 1 capsule (20 mg total) by mouth daily. 90 capsule 3  . vitamin B-12 (CYANOCOBALAMIN) 1000 MCG tablet Take by mouth daily.    . vitamin C (ASCORBIC ACID) 500 MG tablet Take by mouth.    . Vitamin D, Ergocalciferol, (DRISDOL) 50000 UNITS CAPS Take 50,000 Units by mouth every 7 (seven) days. Wednesday.    . vitamin E 100 UNIT capsule Take by mouth daily.     Current Facility-Administered Medications  Medication Dose Route Frequency Provider Last Rate Last Dose  . 0.9 %  sodium chloride infusion  500 mL Intravenous Once Nandigam, Venia Minks, MD        Allergies  Allergen Reactions  . Levofloxacin Other (See Comments)    Red streaks to IV site when IV dose infusing and arm feeling very irritated, throbbing, painful  . Tape     Can use paper tape, adhesive causes redness  . Tapentadol Rash    Can use paper tape,  adhesive causes redness     Past Medical History:  Diagnosis Date  . Anemia   . Axillary mass 7/99   Left  . Barrett's esophagus   . Blood transfusion without reported diagnosis   . CARCINOMA, BREAST 1995   left  . Cervical spinal stenosis   . 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  . Headache(784.0)   . HEMORRHOIDS   . Hypertension   . Personal history of chemotherapy   . Personal history of radiation therapy     Past Surgical History:  Procedure Laterality Date  . BONE MARROW TRANSPLANT  1999  . BREAST LUMPECTOMY  11/95   left breast  . BUNIONECTOMY  3/09, 10/09   right and left  . COLON SURGERY    . COLONOSCOPY    . ERCP  01/21/2012   Procedure: ENDOSCOPIC RETROGRADE CHOLANGIOPANCREATOGRAPHY (ERCP);  Surgeon: Lafayette Dragon, MD;  Location: [facility];  Service: Endoscopy;  Laterality: [name];  . hemicolectomy  01/2005   right  . HYSTEROSCOPY  08/30/94  . INGUINAL HERNIA REPAIR    . NECK MASS EXCISION Left 7/99  . ROTATOR CUFF REPAIR Right 09/08/13   full tear  . TOTAL ABDOMINAL HYSTERECTOMY W/ BILATERAL SALPINGOOPHORECTOMY  1996  . TUNNELED VENOUS CATHETER PLACEMENT  9/98    Social History   Socioeconomic History  . Marital status: Married    Spouse name: Not on  file  . Number of children: 2  . Years of education: Not on file  . Highest education level: Not on file  Occupational History  . Not on file  Social Needs  . Financial resource strain: Not on file  . Food insecurity:    Worry: Not on file    Inability: Not on file  . Transportation needs:    Medical: Not on file    Non-medical: Not on file  Tobacco Use  . Smoking status: Former Smoker    Last attempt to quit: 12/20/1990    Years since quitting: 27.3  . Smokeless tobacco: Never Used  Substance and Sexual Activity  . Alcohol use: Yes    Alcohol/week: 2.0 standard drinks    Types: 2 Standard drinks or equivalent per week    Comment: occasionally  . Drug use: No  . Sexual activity: Not Currently     Birth control/protection: Surgical    Comment: hysterectomy  Lifestyle  . Physical activity:    Days per week: Not on file    Minutes per session: Not on file  . Stress: Not on file  Relationships  . Social connections:    Talks on phone: Not on file    Gets together: Not on file    Attends religious service: Not on file    Active member of club or organization: Not on file    Attends meetings of clubs or organizations: Not on file    Relationship status: Not on file  . Intimate partner violence:    Fear of current or ex partner: Not on file    Emotionally abused: Not on file    Physically abused: Not on file    Forced sexual activity: Not on file  Other Topics Concern  . Not on file  Social History Narrative  . Not on file    Family History  Problem Relation Age of Onset  . Uterine cancer Mother   . Heart attack Father   . Colon cancer Neg Hx   . Esophageal cancer Neg Hx   . Stomach cancer Neg Hx   . Rectal cancer Neg Hx     ROS: no fevers or chills, productive cough, hemoptysis, dysphasia, odynophagia, melena, hematochezia, dysuria, hematuria, rash, seizure activity, orthopnea, PND, pedal edema, claudication. Remaining systems are negative.  Physical Exam:   Blood pressure (!) 162/92, pulse 83, height 5\' 4"  (1.626 m), weight 146 lb 3.2 oz (66.3 kg), last menstrual period 07/30/1995.  General:  Well developed/well nourished in NAD Skin warm/dry Patient not depressed No peripheral clubbing Back-normal HEENT-normal/normal eyelids Neck supple/normal carotid upstroke bilaterally; right carotid bruit; no JVD; no thyromegaly chest - CTA/ normal expansion CV - RRR/normal S1 and S2; no murmurs, rubs or gallops;  PMI nondisplaced Abdomen -NT/ND, no HSM, no mass, + bowel sounds, no bruit 2+ femoral pulses, no bruits Ext-no edema, chords, 2+ DP Neuro-grossly nonfocal  ECG -sinus rhythm with occasional PVC.  No ST changes.  Personally reviewed  A/P  1 PVCs-patient has  noticed any irregular heartbeat on her monitor when checking her blood pressure.  Her electrocardiogram today shows PVC.  I explained that these are benign in the setting of normal LV function.  We will schedule an echocardiogram to further assess.  2 dyspnea-mild dyspnea at times.  Schedule echocardiogram to further assess.  3 right carotid bruit-schedule carotid Dopplers.  4 hypertension-blood pressure is mildly elevated but she follows this closely at home and it is typically controlled.  Continue  present medications and follow.  Kirk Ruths, MD

## 2018-05-01 NOTE — Patient Instructions (Signed)
Medication Instructions:  NO CHANGE If you need a refill on your cardiac medications before your next appointment, please call your pharmacy.   Lab work: If you have labs (blood work) drawn today and your tests are completely normal, you will receive your results only by: Marland Kitchen MyChart Message (if you have MyChart) OR . A paper copy in the mail If you have any lab test that is abnormal or we need to change your treatment, we will call you to review the results.  Testing/Procedures: Your physician has requested that you have an echocardiogram. Echocardiography is a painless test that uses sound waves to create images of your heart. It provides your doctor with information about the size and shape of your heart and how well your heart's chambers and valves are working. This procedure takes approximately one hour. There are no restrictions for this procedure.   Your physician has requested that you have a carotid duplex. This test is an ultrasound of the carotid arteries in your neck. It looks at blood flow through these arteries that supply the brain with blood. Allow one hour for this exam. There are no restrictions or special instructions.    Follow-Up: At [facility], you and your health needs are our priority.  As part of our continuing mission to provide you with exceptional heart care, we have created designated Provider Care Teams.  These Care Teams include your primary Cardiologist (physician) and Advanced Practice Providers (APPs -  Physician Assistants and Nurse Practitioners) who all work together to provide you with the care you need, when you need it. You will need a follow up appointment in AS NEEDED PENDING TEST RESULTS

## 2018-05-04 MED FILL — OMEPRAZOLE 20 MG CPDR: 20 | 90 days supply | Qty: 90 | Fill #1

## 2018-05-04 MED FILL — VIT D2 1.25 MG (50,000 UNIT: 1.25 MG | 84 days supply | Qty: 12 | Fill #2

## 2018-05-04 MED FILL — LOSARTAN POTASSIUM 50 MG TA: 50 | 90 days supply | Qty: 180 | Fill #2

## 2018-05-05 ENCOUNTER — Ambulatory Visit (HOSPITAL_COMMUNITY)
Admission: RE | Admit: 2018-05-05 | Discharge: 2018-05-05 | Disposition: A | Discharge status: Home / Self Care | Payer: Medicare Other | Source: Ambulatory Visit | Attending: Cardiovascular Disease | Admitting: Cardiovascular Disease

## 2018-05-05 DIAGNOSIS — R0989 Other specified symptoms and signs involving the circulatory and respiratory systems: Secondary | ICD-10-CM | POA: Diagnosis not present

## 2018-05-06 DIAGNOSIS — E559 Vitamin D deficiency, unspecified: Secondary | ICD-10-CM | POA: Diagnosis not present

## 2018-05-06 DIAGNOSIS — E7800 Pure hypercholesterolemia, unspecified: Secondary | ICD-10-CM | POA: Diagnosis not present

## 2018-05-06 DIAGNOSIS — M5432 Sciatica, left side: Secondary | ICD-10-CM | POA: Diagnosis not present

## 2018-05-06 DIAGNOSIS — K219 Gastro-esophageal reflux disease without esophagitis: Secondary | ICD-10-CM | POA: Diagnosis not present

## 2018-05-06 DIAGNOSIS — I10 Essential (primary) hypertension: Secondary | ICD-10-CM | POA: Diagnosis not present

## 2018-05-06 DIAGNOSIS — Z23 Encounter for immunization: Secondary | ICD-10-CM | POA: Diagnosis not present

## 2018-05-06 DIAGNOSIS — F5101 Primary insomnia: Secondary | ICD-10-CM | POA: Diagnosis not present

## 2018-05-13 ENCOUNTER — Ambulatory Visit (HOSPITAL_COMMUNITY): Payer: Medicare Other | Attending: Cardiology

## 2018-05-13 ENCOUNTER — Other Ambulatory Visit: Payer: Self-pay

## 2018-05-13 DIAGNOSIS — R0602 Shortness of breath: Secondary | ICD-10-CM | POA: Diagnosis not present

## 2018-05-13 DIAGNOSIS — I493 Ventricular premature depolarization: Secondary | ICD-10-CM | POA: Diagnosis not present

## 2018-05-19 ENCOUNTER — Ambulatory Visit (INDEPENDENT_AMBULATORY_CARE_PROVIDER_SITE_OTHER): Payer: Medicare Other | Admitting: Cardiology

## 2018-05-19 ENCOUNTER — Encounter: Payer: Self-pay | Admitting: Cardiology

## 2018-05-19 VITALS — BP 144/80 | HR 84 | Ht 64.0 in | Wt 145.6 lb

## 2018-05-19 DIAGNOSIS — I429 Cardiomyopathy, unspecified: Secondary | ICD-10-CM

## 2018-05-19 DIAGNOSIS — I493 Ventricular premature depolarization: Secondary | ICD-10-CM | POA: Diagnosis not present

## 2018-05-19 DIAGNOSIS — R0602 Shortness of breath: Secondary | ICD-10-CM

## 2018-05-19 MED ORDER — ASPIRIN EC 81 MG PO TBEC: 81.0000 mg | DELAYED_RELEASE_TABLET | Freq: Every day | ORAL | 3 refills | Status: DC

## 2018-05-19 MED ORDER — CARVEDILOL 6.25 MG PO TABS: 6.2500 mg | ORAL_TABLET | Freq: Two times a day (BID) | ORAL | 3 refills | Status: DC

## 2018-05-19 MED FILL — CARVEDILOL 6.25 MG TABLET: 6.25 | 90 days supply | Qty: 180 | Fill #0

## 2018-05-19 NOTE — Progress Notes (Signed)
HPI: Follow-up cardiomyopathy.  Patient recently seen for palpitations.  Echocardiogram 10/19 showed newly reduced LV function with ejection fraction 25 to 30%, mild mitral regurgitation and biatrial enlargement.  Carotid Dopplers October 2019 showed 1 to 39% bilateral stenosis.  Since last seen patient denies dyspnea on exertion, orthopnea, PND, pedal edema, syncope or chest pain.  She continues to have occasional palpitations described as a skip.  Current Outpatient Medications  Medication Sig Dispense Refill  . calcium carbonate (OS-CAL) 600 MG TABS tablet Take 600 mg by mouth daily.     . fluorometholone (FML) 0.1 % ophthalmic suspension as needed    . losartan (COZAAR) 50 MG tablet Take 50 mg by mouth daily.    . Multiple Vitamin (MULTIVITAMIN WITH MINERALS) TABS tablet Take 1 tablet by mouth daily.    Marland Kitchen omeprazole (PRILOSEC) 20 MG capsule Take 1 capsule (20 mg total) by mouth daily. 90 capsule 3  . vitamin B-12 (CYANOCOBALAMIN) 1000 MCG tablet Take by mouth daily.    . vitamin C (ASCORBIC ACID) 500 MG tablet Take by mouth.    . Vitamin D, Ergocalciferol, (DRISDOL) 50000 UNITS CAPS Take 50,000 Units by mouth every 7 (seven) days. Wednesday.    . vitamin E 100 UNIT capsule Take by mouth daily.     Current Facility-Administered Medications  Medication Dose Route Frequency Provider Last Rate Last Dose  . 0.9 %  sodium chloride infusion  500 mL Intravenous Once Mauri Pole, MD         Past Medical History:  Diagnosis Date  . Anemia   . Axillary mass 7/99   Left  . Barrett's esophagus   . Blood transfusion without reported diagnosis   . CARCINOMA, BREAST 1995   left  . Cervical spinal stenosis   . 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  . Headache(784.0)   . HEMORRHOIDS   . Hypertension   . Personal history of chemotherapy   . Personal history of radiation therapy     Past Surgical History:  Procedure Laterality Date  . BONE MARROW TRANSPLANT  1999  . BREAST  LUMPECTOMY  11/95   left breast  . BUNIONECTOMY  3/09, 10/09   right and left  . COLON SURGERY    . COLONOSCOPY    . ERCP  01/21/2012   Procedure: ENDOSCOPIC RETROGRADE CHOLANGIOPANCREATOGRAPHY (ERCP);  Surgeon: Lafayette Dragon, MD;  Location: [facility];  Service: Endoscopy;  Laterality: [name];  . hemicolectomy  01/2005   right  . HYSTEROSCOPY  08/30/94  . INGUINAL HERNIA REPAIR    . NECK MASS EXCISION Left 7/99  . ROTATOR CUFF REPAIR Right 09/08/13   full tear  . TOTAL ABDOMINAL HYSTERECTOMY W/ BILATERAL SALPINGOOPHORECTOMY  1996  . TUNNELED VENOUS CATHETER PLACEMENT  9/98    Social History   Socioeconomic History  . Marital status: Married    Spouse name: Not on file  . Number of children: 2  . Years of education: Not on file  . Highest education level: Not on file  Occupational History  . Not on file  Social Needs  . Financial resource strain: Not on file  . Food insecurity:    Worry: Not on file    Inability: Not on file  . Transportation needs:    Medical: Not on file    Non-medical: Not on file  Tobacco Use  . Smoking status: Former Smoker    Last attempt to quit: 12/20/1990    Years  since quitting: 27.4  . Smokeless tobacco: Never Used  Substance and Sexual Activity  . Alcohol use: Yes    Alcohol/week: 2.0 standard drinks    Types: 2 Standard drinks or equivalent per week    Comment: occasionally  . Drug use: No  . Sexual activity: Not Currently    Birth control/protection: Surgical    Comment: hysterectomy  Lifestyle  . Physical activity:    Days per week: Not on file    Minutes per session: Not on file  . Stress: Not on file  Relationships  . Social connections:    Talks on phone: Not on file    Gets together: Not on file    Attends religious service: Not on file    Active member of club or organization: Not on file    Attends meetings of clubs or organizations: Not on file    Relationship status: Not on file  . Intimate partner violence:    Fear of  current or ex partner: Not on file    Emotionally abused: Not on file    Physically abused: Not on file    Forced sexual activity: Not on file  Other Topics Concern  . Not on file  Social History Narrative  . Not on file    Family History  Problem Relation Age of Onset  . Uterine cancer Mother   . Heart attack Father   . Colon cancer Neg Hx   . Esophageal cancer Neg Hx   . Stomach cancer Neg Hx   . Rectal cancer Neg Hx     ROS: no fevers or chills, productive cough, hemoptysis, dysphasia, odynophagia, melena, hematochezia, dysuria, hematuria, rash, seizure activity, orthopnea, PND, pedal edema, claudication. Remaining systems are negative.  Physical Exam: Well-developed well-nourished in no acute distress.  Skin is warm and dry.  HEENT is normal.  Neck is supple.  Chest is clear to auscultation with normal expansion.  Cardiovascular exam is regular rate and rhythm.  Abdominal exam nontender or distended. No masses palpated. Extremities show no edema. neuro grossly intact  A/P  1 newly diagnosed cardiomyopathy-etiology unclear.  She does not consume significant amounts of alcohol.  She does have a history of breast cancer but this was in the 1990s.  I do not know what kind of chemotherapy she received.  I will arrange a left heart catheterization to exclude coronary disease.  The risks and benefits including myocardial infarction, CVA and death discussed and she agrees to proceed.  Check TSH.  Check 24-hour Holter monitor to exclude PVCs as a cause.  Continue ARB.  Add carvedilol 6.25 mg twice daily.  Titrate medications as an outpatient.  Once medications fully titrated we will need repeat echocardiogram versus cardiac MRI to reassess LV function.  MRI would also would exclude infiltrative cardiomyopathy.  If ejection fraction less than 30 to 35% following above would need to consider ICD.  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  We will arrange a 24-hour Holter monitor  to assess burden.  We are adding a beta-blocker for cardiomyopathy which should also help with PVCs.  3 hypertension-blood pressure borderline.  Add carvedilol as outlined above.  Follow and advance regimen as needed.  Kirk Ruths, MD

## 2018-05-19 NOTE — Patient Instructions (Signed)
    [facility] [place] [address] Dept: [phone] Loc: [phone]  Eileen [name]  05/19/2018  You are scheduled for a Cardiac Catheterization on Thursday, October 24 with Dr. Harrell Gave End.  1. Please arrive at the [facility] (Main Entrance A) at [facility]: [place],  [address] at 7:00 AM (This time is two hours before your procedure to ensure your preparation). Free valet parking service is available.   Special note: Every effort is made to have your procedure done on time. Please understand that emergencies sometimes delay scheduled procedures.  2. Diet: Do not eat solid foods after midnight.  The patient may have clear liquids until 5am upon the day of the procedure.  3. Labs: You will need to have blood drawn TODAY  4. Medication instructions in preparation for your procedure:  START CARVEDILOL 6.25 MG TWICE DAILY  START ASPIRIN 81 MG ONCE DAILY  On the morning of your procedure, take your Aspirin and any morning medicines NOT listed above.  You may use sips of water.  5. Plan for one night stay--bring personal belongings. 6. Bring a current list of your medications and current insurance cards. 7. You MUST have a responsible person to drive you home. 8. Someone MUST be with you the first 24 hours after you arrive home or your discharge will be delayed. 9. Please wear clothes that are easy to get on and off and wear slip-on shoes.  Thank you for allowing [place] to care for you!   -- [facility]   Your physician has recommended that you wear a 24 HOUR holter monitor. Holter monitors are medical devices that record the heart's electrical activity. Doctors most often use these monitors to diagnose arrhythmias. Arrhythmias are problems with the speed or rhythm of the heartbeat. The monitor is a small, portable  device. You can wear one while you do your normal daily activities. This is usually used to diagnose what is causing palpitations/syncope (passing out).

## 2018-05-19 NOTE — H&P (View-Only) (Signed)
HPI: Follow-up cardiomyopathy.  Patient recently seen for palpitations.  Echocardiogram 10/19 showed newly reduced LV function with ejection fraction 25 to 30%, mild mitral regurgitation and biatrial enlargement.  Carotid Dopplers October 2019 showed 1 to 39% bilateral stenosis.  Since last seen patient denies dyspnea on exertion, orthopnea, PND, pedal edema, syncope or chest pain.  She continues to have occasional palpitations described as a skip.  Current Outpatient Medications  Medication Sig Dispense Refill  . calcium carbonate (OS-CAL) 600 MG TABS tablet Take 600 mg by mouth daily.     . fluorometholone (FML) 0.1 % ophthalmic suspension as needed    . losartan (COZAAR) 50 MG tablet Take 50 mg by mouth daily.    . Multiple Vitamin (MULTIVITAMIN WITH MINERALS) TABS tablet Take 1 tablet by mouth daily.    Marland Kitchen omeprazole (PRILOSEC) 20 MG capsule Take 1 capsule (20 mg total) by mouth daily. 90 capsule 3  . vitamin B-12 (CYANOCOBALAMIN) 1000 MCG tablet Take by mouth daily.    . vitamin C (ASCORBIC ACID) 500 MG tablet Take by mouth.    . Vitamin D, Ergocalciferol, (DRISDOL) 50000 UNITS CAPS Take 50,000 Units by mouth every 7 (seven) days. Wednesday.    . vitamin E 100 UNIT capsule Take by mouth daily.     Current Facility-Administered Medications  Medication Dose Route Frequency Provider Last Rate Last Dose  . 0.9 %  sodium chloride infusion  500 mL Intravenous Once Mauri Pole, MD         Past Medical History:  Diagnosis Date  . Anemia   . Axillary mass 7/99   Left  . Barrett's esophagus   . Blood transfusion without reported diagnosis   . CARCINOMA, BREAST 1995   left  . Cervical spinal stenosis   . 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  . Headache(784.0)   . HEMORRHOIDS   . Hypertension   . Personal history of chemotherapy   . Personal history of radiation therapy     Past Surgical History:  Procedure Laterality Date  . BONE MARROW TRANSPLANT  1999  . BREAST  LUMPECTOMY  11/95   left breast  . BUNIONECTOMY  3/09, 10/09   right and left  . COLON SURGERY    . COLONOSCOPY    . ERCP  01/21/2012   Procedure: ENDOSCOPIC RETROGRADE CHOLANGIOPANCREATOGRAPHY (ERCP);  Surgeon: Lafayette Dragon, MD;  Location: [facility];  Service: Endoscopy;  Laterality: [name];  . hemicolectomy  01/2005   right  . HYSTEROSCOPY  08/30/94  . INGUINAL HERNIA REPAIR    . NECK MASS EXCISION Left 7/99  . ROTATOR CUFF REPAIR Right 09/08/13   full tear  . TOTAL ABDOMINAL HYSTERECTOMY W/ BILATERAL SALPINGOOPHORECTOMY  1996  . TUNNELED VENOUS CATHETER PLACEMENT  9/98    Social History   Socioeconomic History  . Marital status: Married    Spouse name: Not on file  . Number of children: 2  . Years of education: Not on file  . Highest education level: Not on file  Occupational History  . Not on file  Social Needs  . Financial resource strain: Not on file  . Food insecurity:    Worry: Not on file    Inability: Not on file  . Transportation needs:    Medical: Not on file    Non-medical: Not on file  Tobacco Use  . Smoking status: Former Smoker    Last attempt to quit: 12/20/1990    Years  since quitting: 27.4  . Smokeless tobacco: Never Used  Substance and Sexual Activity  . Alcohol use: Yes    Alcohol/week: 2.0 standard drinks    Types: 2 Standard drinks or equivalent per week    Comment: occasionally  . Drug use: No  . Sexual activity: Not Currently    Birth control/protection: Surgical    Comment: hysterectomy  Lifestyle  . Physical activity:    Days per week: Not on file    Minutes per session: Not on file  . Stress: Not on file  Relationships  . Social connections:    Talks on phone: Not on file    Gets together: Not on file    Attends religious service: Not on file    Active member of club or organization: Not on file    Attends meetings of clubs or organizations: Not on file    Relationship status: Not on file  . Intimate partner violence:    Fear of  current or ex partner: Not on file    Emotionally abused: Not on file    Physically abused: Not on file    Forced sexual activity: Not on file  Other Topics Concern  . Not on file  Social History Narrative  . Not on file    Family History  Problem Relation Age of Onset  . Uterine cancer Mother   . Heart attack Father   . Colon cancer Neg Hx   . Esophageal cancer Neg Hx   . Stomach cancer Neg Hx   . Rectal cancer Neg Hx     ROS: no fevers or chills, productive cough, hemoptysis, dysphasia, odynophagia, melena, hematochezia, dysuria, hematuria, rash, seizure activity, orthopnea, PND, pedal edema, claudication. Remaining systems are negative.  Physical Exam: Well-developed well-nourished in no acute distress.  Skin is warm and dry.  HEENT is normal.  Neck is supple.  Chest is clear to auscultation with normal expansion.  Cardiovascular exam is regular rate and rhythm.  Abdominal exam nontender or distended. No masses palpated. Extremities show no edema. neuro grossly intact  A/P  1 newly diagnosed cardiomyopathy-etiology unclear.  She does not consume significant amounts of alcohol.  She does have a history of breast cancer but this was in the 1990s.  I do not know what kind of chemotherapy she received.  I will arrange a left heart catheterization to exclude coronary disease.  The risks and benefits including myocardial infarction, CVA and death discussed and she agrees to proceed.  Check TSH.  Check 24-hour Holter monitor to exclude PVCs as a cause.  Continue ARB.  Add carvedilol 6.25 mg twice daily.  Titrate medications as an outpatient.  Once medications fully titrated we will need repeat echocardiogram versus cardiac MRI to reassess LV function.  MRI would also would exclude infiltrative cardiomyopathy.  If ejection fraction less than 30 to 35% following above would need to consider ICD.  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  We will arrange a 24-hour Holter monitor  to assess burden.  We are adding a beta-blocker for cardiomyopathy which should also help with PVCs.  3 hypertension-blood pressure borderline.  Add carvedilol as outlined above.  Follow and advance regimen as needed.  Kirk Ruths, MD

## 2018-05-20 LAB — CBC
Hematocrit: 41.3 % (ref 34.0–46.6)
Hemoglobin: 13.3 g/dL (ref 11.1–15.9)
MCH: 27.9 pg (ref 26.6–33.0)
MCHC: 32.2 g/dL (ref 31.5–35.7)
MCV: 87 fL (ref 79–97)
Platelets: 240 10*3/uL (ref 150–450)
RBC: 4.77 x10E6/uL (ref 3.77–5.28)
RDW: 12.2 % — ABNORMAL LOW (ref 12.3–15.4)
WBC: 8.6 10*3/uL (ref 3.4–10.8)

## 2018-05-20 LAB — BASIC METABOLIC PANEL
BUN/Creatinine Ratio: 18 (ref 12–28)
BUN: 16 mg/dL (ref 8–27)
CO2: 26 mmol/L (ref 20–29)
Calcium: 9.9 mg/dL (ref 8.7–10.3)
Chloride: 103 mmol/L (ref 96–106)
Creatinine, Ser: 0.87 mg/dL (ref 0.57–1.00)
GFR calc Af Amer: 78 mL/min/{1.73_m2} (ref >59)
GFR calc non Af Amer: 68 mL/min/{1.73_m2} (ref >59)
Glucose: 95 mg/dL (ref 65–99)
Potassium: 4.9 mmol/L (ref 3.5–5.2)
Sodium: 143 mmol/L (ref 134–144)

## 2018-05-20 LAB — TSH: TSH: 6.49 u[IU]/mL — ABNORMAL HIGH (ref 0.450–4.500)

## 2018-05-21 ENCOUNTER — Encounter (HOSPITAL_COMMUNITY)
Admission: RE | Disposition: A | Discharge status: Home / Self Care | Payer: Self-pay | Source: Ambulatory Visit | Attending: Internal Medicine

## 2018-05-21 ENCOUNTER — Ambulatory Visit (HOSPITAL_COMMUNITY)
Admission: RE | Admit: 2018-05-21 | Discharge: 2018-05-21 | Disposition: A | Discharge status: Home / Self Care | Payer: Medicare Other | Source: Ambulatory Visit | Attending: Internal Medicine | Admitting: Internal Medicine

## 2018-05-21 ENCOUNTER — Other Ambulatory Visit: Payer: Self-pay

## 2018-05-21 DIAGNOSIS — I493 Ventricular premature depolarization: Secondary | ICD-10-CM | POA: Insufficient documentation

## 2018-05-21 DIAGNOSIS — R002 Palpitations: Secondary | ICD-10-CM | POA: Insufficient documentation

## 2018-05-21 DIAGNOSIS — Z85038 Personal history of other malignant neoplasm of large intestine: Secondary | ICD-10-CM | POA: Insufficient documentation

## 2018-05-21 DIAGNOSIS — Z8249 Family history of ischemic heart disease and other diseases of the circulatory system: Secondary | ICD-10-CM | POA: Insufficient documentation

## 2018-05-21 DIAGNOSIS — Z9049 Acquired absence of other specified parts of digestive tract: Secondary | ICD-10-CM | POA: Insufficient documentation

## 2018-05-21 DIAGNOSIS — I110 Hypertensive heart disease with heart failure: Secondary | ICD-10-CM | POA: Diagnosis not present

## 2018-05-21 DIAGNOSIS — Z923 Personal history of irradiation: Secondary | ICD-10-CM | POA: Insufficient documentation

## 2018-05-21 DIAGNOSIS — I5022 Chronic systolic (congestive) heart failure: Secondary | ICD-10-CM

## 2018-05-21 DIAGNOSIS — Z87891 Personal history of nicotine dependence: Secondary | ICD-10-CM | POA: Diagnosis not present

## 2018-05-21 DIAGNOSIS — Z808 Family history of malignant neoplasm of other organs or systems: Secondary | ICD-10-CM | POA: Diagnosis not present

## 2018-05-21 DIAGNOSIS — Z90710 Acquired absence of both cervix and uterus: Secondary | ICD-10-CM | POA: Diagnosis not present

## 2018-05-21 DIAGNOSIS — Z9221 Personal history of antineoplastic chemotherapy: Secondary | ICD-10-CM | POA: Insufficient documentation

## 2018-05-21 DIAGNOSIS — Z853 Personal history of malignant neoplasm of breast: Secondary | ICD-10-CM | POA: Diagnosis not present

## 2018-05-21 DIAGNOSIS — Z9481 Bone marrow transplant status: Secondary | ICD-10-CM | POA: Insufficient documentation

## 2018-05-21 DIAGNOSIS — Z79899 Other long term (current) drug therapy: Secondary | ICD-10-CM | POA: Diagnosis not present

## 2018-05-21 DIAGNOSIS — I429 Cardiomyopathy, unspecified: Secondary | ICD-10-CM

## 2018-05-21 DIAGNOSIS — Z90722 Acquired absence of ovaries, bilateral: Secondary | ICD-10-CM | POA: Diagnosis not present

## 2018-05-21 DIAGNOSIS — Z9889 Other specified postprocedural states: Secondary | ICD-10-CM | POA: Insufficient documentation

## 2018-05-21 DIAGNOSIS — Z8719 Personal history of other diseases of the digestive system: Secondary | ICD-10-CM

## 2018-05-21 HISTORY — PX: LEFT HEART CATH AND CORONARY ANGIOGRAPHY: CATH118249

## 2018-05-21 SURGERY — LEFT HEART CATH AND CORONARY ANGIOGRAPHY
Anesthesia: LOCAL

## 2018-05-21 MED ORDER — MIDAZOLAM HCL 2 MG/2ML IJ SOLN
INTRAMUSCULAR | Status: AC
  Filled 2018-05-21: qty 2

## 2018-05-21 MED ORDER — VERAPAMIL HCL 2.5 MG/ML IV SOLN
INTRAVENOUS | Status: DC | PRN
  Administered 2018-05-21: 10 mL via INTRA_ARTERIAL

## 2018-05-21 MED ORDER — LIDOCAINE HCL (PF) 1 % IJ SOLN
INTRAMUSCULAR | Status: DC | PRN
  Administered 2018-05-21: 2 mL

## 2018-05-21 MED ORDER — ASPIRIN 81 MG PO CHEW
CHEWABLE_TABLET | ORAL | Status: AC
  Administered 2018-05-21: 81 mg via ORAL
  Filled 2018-05-21: qty 1

## 2018-05-21 MED ORDER — SODIUM CHLORIDE 0.9 % WEIGHT BASED INFUSION
3.0000 mL/kg/h | INTRAVENOUS | Status: AC
  Administered 2018-05-21: 3 mL/kg/h via INTRAVENOUS

## 2018-05-21 MED ORDER — HEPARIN (PORCINE) IN NACL 1000-0.9 UT/500ML-% IV SOLN
INTRAVENOUS | Status: DC | PRN
  Administered 2018-05-21: 500 mL

## 2018-05-21 MED ORDER — ASPIRIN 81 MG PO CHEW
81.0000 mg | CHEWABLE_TABLET | ORAL | Status: AC
  Administered 2018-05-21: 81 mg via ORAL

## 2018-05-21 MED ORDER — FENTANYL CITRATE (PF) 100 MCG/2ML IJ SOLN
INTRAMUSCULAR | Status: AC
  Filled 2018-05-21: qty 2

## 2018-05-21 MED ORDER — LIDOCAINE HCL (PF) 1 % IJ SOLN
INTRAMUSCULAR | Status: AC
  Filled 2018-05-21: qty 30

## 2018-05-21 MED ORDER — SODIUM CHLORIDE 0.9 % WEIGHT BASED INFUSION: 1.0000 mL/kg/h | INTRAVENOUS | Status: DC

## 2018-05-21 MED ORDER — HEPARIN SODIUM (PORCINE) 1000 UNIT/ML IJ SOLN
INTRAMUSCULAR | Status: DC | PRN
  Administered 2018-05-21: 3500 [IU] via INTRAVENOUS

## 2018-05-21 MED ORDER — VERAPAMIL HCL 2.5 MG/ML IV SOLN
INTRAVENOUS | Status: AC
  Filled 2018-05-21: qty 2

## 2018-05-21 MED ORDER — FENTANYL CITRATE (PF) 100 MCG/2ML IJ SOLN
INTRAMUSCULAR | Status: DC | PRN
  Administered 2018-05-21: 25 ug via INTRAVENOUS

## 2018-05-21 MED ORDER — ACETAMINOPHEN 325 MG PO TABS: 650.0000 mg | ORAL_TABLET | ORAL | Status: DC | PRN

## 2018-05-21 MED ORDER — SODIUM CHLORIDE 0.9% FLUSH: 3.0000 mL | INTRAVENOUS | Status: DC | PRN

## 2018-05-21 MED ORDER — MIDAZOLAM HCL 2 MG/2ML IJ SOLN
INTRAMUSCULAR | Status: DC | PRN
  Administered 2018-05-21: 1 mg via INTRAVENOUS

## 2018-05-21 MED ORDER — IOHEXOL 350 MG/ML SOLN
INTRAVENOUS | Status: DC | PRN
  Administered 2018-05-21: 30 mL via INTRA_ARTERIAL

## 2018-05-21 MED ORDER — HEPARIN (PORCINE) IN NACL 1000-0.9 UT/500ML-% IV SOLN
INTRAVENOUS | Status: AC
  Filled 2018-05-21: qty 1000

## 2018-05-21 MED ORDER — SODIUM CHLORIDE 0.9% FLUSH: 3.0000 mL | Freq: Two times a day (BID) | INTRAVENOUS | Status: DC

## 2018-05-21 MED ORDER — ONDANSETRON HCL 4 MG/2ML IJ SOLN: 4.0000 mg | Freq: Four times a day (QID) | INTRAMUSCULAR | Status: DC | PRN

## 2018-05-21 MED ORDER — SODIUM CHLORIDE 0.9 % IV SOLN: 250.0000 mL | INTRAVENOUS | Status: DC | PRN

## 2018-05-21 SURGICAL SUPPLY — 10 items
CATH 5FR JL3.5 JR4 ANG PIG MP (CATHETERS) ×2 IMPLANT
DEVICE RAD COMP TR BAND LRG (VASCULAR PRODUCTS) ×2 IMPLANT
GLIDESHEATH SLEND SS 6F .021 (SHEATH) ×2 IMPLANT
GUIDEWIRE INQWIRE 1.5J.035X260 (WIRE) ×1 IMPLANT
INQWIRE 1.5J .035X260CM (WIRE) ×2
KIT HEART LEFT (KITS) ×2 IMPLANT
PACK CARDIAC CATHETERIZATION (CUSTOM PROCEDURE TRAY) ×2 IMPLANT
TRANSDUCER W/STOPCOCK (MISCELLANEOUS) ×2 IMPLANT
TUBING CIL FLEX 10 FLL-RA (TUBING) ×2 IMPLANT
WIRE HI TORQ VERSACORE-J 145CM (WIRE) ×2 IMPLANT

## 2018-05-21 NOTE — Progress Notes (Signed)
Ambulated to bathroom to void.

## 2018-05-21 NOTE — Discharge Instructions (Signed)
Drink plenty of fluids.  °Keep right arm at or above heart level °Radial Site Care °Refer to this sheet in the next few weeks. These instructions provide you with information about caring for yourself after your procedure. Your health care provider may also give you more specific instructions. Your treatment has been planned according to current medical practices, but problems sometimes occur. Call your health care provider if you have any problems or questions after your procedure. °What can I expect after the procedure? °After your procedure, it is typical to have the following: °· Bruising at the radial site that usually fades within 1-2 weeks. °· Blood collecting in the tissue (hematoma) that may be painful to the touch. It should usually decrease in size and tenderness within 1-2 weeks. ° °Follow these instructions at home: °· Take medicines only as directed by your health care provider. °· You may shower 24-48 hours after the procedure or as directed by your health care provider. Remove the bandage (dressing) and gently wash the site with plain soap and water. Pat the area dry with a clean towel. Do not rub the site, because this may cause bleeding. °· Do not take baths, swim, or use a hot tub until your health care provider approves. °· Check your insertion site every day for redness, swelling, or drainage. °· Do not apply powder or lotion to the site. °· Do not flex or bend the affected arm for 24 hours or as directed by your health care provider. °· Do not push or pull heavy objects with the affected arm for 24 hours or as directed by your health care provider. °· Do not lift over 10 lb (4.5 kg) for 5 days after your procedure or as directed by your health care provider. °· Ask your health care provider when it is okay to: °? Return to work or school. °? Resume usual physical activities or sports. °? Resume sexual activity. °· Do not drive home if you are discharged the same day as the procedure. Have  someone else drive you. °· You may drive 24 hours after the procedure unless otherwise instructed by your health care provider. °· Do not operate machinery or power tools for 24 hours after the procedure. °· If your procedure was done as an outpatient procedure, which means that you went home the same day as your procedure, a responsible adult should be with you for the first 24 hours after you arrive home. °· Keep all follow-up visits as directed by your health care provider. This is important. °Contact a health care provider if: °· You have a fever. °· You have chills. °· You have increased bleeding from the radial site. Hold pressure on the site. °Get help right away if: °· You have unusual pain at the radial site. °· You have redness, warmth, or swelling at the radial site. °· You have drainage (other than a small amount of blood on the dressing) from the radial site. °· The radial site is bleeding, and the bleeding does not stop after 30 minutes of holding steady pressure on the site. °· Your arm or hand becomes pale, cool, tingly, or numb. °This information is not intended to replace advice given to you by your health care provider. Make sure you discuss any questions you have with your health care provider. °Document Released: 08/17/2010 Document Revised: 12/21/2015 Document Reviewed: 01/31/2014 °Elsevier Interactive Patient Education © 2018 [employer] ° °

## 2018-05-21 NOTE — Brief Op Note (Signed)
BRIEF CARDIAC CATHETERIZATION NOTE  DATE: 05/21/2018 TIME: 11:13 AM  PATIENT:  Eileen [name]  72 y.o. female  PRE-OPERATIVE DIAGNOSIS:  Cardiomyopathy and chronic systolic heart failure  POST-OPERATIVE DIAGNOSIS:  NICM  PROCEDURE:  Procedure(s): LEFT HEART CATH AND CORONARY ANGIOGRAPHY ([name])  SURGEON:  Surgeon(s) and Role:    * Yosselyn Tax, Harrell Gave, MD - Primary  FINDINGS: 1. No angiographically significant coronary artery disease. 2. Mildly elevated LVEDP.  RECOMMENDATIONS: 1. Continue medical therapy for non-ischemic cardiomyopathy.  Nelva Bush, MD [facility] Pager: [phone]

## 2018-05-21 NOTE — Interval H&P Note (Signed)
History and Physical Interval Note:  05/21/2018 10:28 AM  Eileen [name] Eileen [name]  has presented today for cardiac catheterization, with the diagnosis of cardiomyopathy and chronic systolic heart failure.  The various methods of treatment have been discussed with the patient and family. After consideration of risks, benefits and other options for treatment, the patient has consented to  Procedure(s): LEFT HEART CATH AND CORONARY ANGIOGRAPHY ([name]) as a surgical intervention .  The patient's history has been reviewed, patient examined, no change in status, stable for surgery.  I have reviewed the patient's chart and labs.  Questions were answered to the patient's satisfaction.    Cath Lab Visit (complete for each Cath Lab visit)  Clinical Evaluation Leading to the Procedure:   ACS: No.  Non-ACS:    Anginal/heart failure Classification: NYHA II  Anti-ischemic medical therapy: Minimal Therapy (1 class of medications)  Non-Invasive Test Results: No non-invasive testing performed; LVEF severely reduced -> high risk  Prior CABG: No previous CABG  Athziry Millican

## 2018-05-22 ENCOUNTER — Encounter (HOSPITAL_COMMUNITY): Payer: Self-pay | Admitting: Internal Medicine

## 2018-05-26 DIAGNOSIS — M9904 Segmental and somatic dysfunction of sacral region: Secondary | ICD-10-CM | POA: Diagnosis not present

## 2018-06-01 ENCOUNTER — Encounter (HOSPITAL_COMMUNITY): Payer: Self-pay | Admitting: Internal Medicine

## 2018-06-02 DIAGNOSIS — M9904 Segmental and somatic dysfunction of sacral region: Secondary | ICD-10-CM | POA: Diagnosis not present

## 2018-06-03 ENCOUNTER — Ambulatory Visit (INDEPENDENT_AMBULATORY_CARE_PROVIDER_SITE_OTHER): Payer: Medicare Other

## 2018-06-03 ENCOUNTER — Other Ambulatory Visit: Payer: Self-pay | Admitting: Cardiology

## 2018-06-03 DIAGNOSIS — R002 Palpitations: Secondary | ICD-10-CM | POA: Diagnosis not present

## 2018-06-03 DIAGNOSIS — I429 Cardiomyopathy, unspecified: Secondary | ICD-10-CM | POA: Diagnosis not present

## 2018-06-08 ENCOUNTER — Other Ambulatory Visit: Payer: Self-pay

## 2018-06-08 ENCOUNTER — Ambulatory Visit: Payer: Medicare Other | Attending: Orthopedic Surgery | Admitting: Physical Therapy

## 2018-06-08 DIAGNOSIS — R29898 Other symptoms and signs involving the musculoskeletal system: Secondary | ICD-10-CM | POA: Diagnosis not present

## 2018-06-08 DIAGNOSIS — M5442 Lumbago with sciatica, left side: Secondary | ICD-10-CM | POA: Diagnosis not present

## 2018-06-08 DIAGNOSIS — M6281 Muscle weakness (generalized): Secondary | ICD-10-CM

## 2018-06-08 NOTE — Therapy (Signed)
[facility] [address]  [place], [place], [address] Phone: [phone]   Fax:  [phone]  Physical Therapy Evaluation  Patient Details  Name: Eileen [name] MRN: [id number] Date of Birth: [birth year] Referring Provider (PT): Melina Schools, MD   Encounter Date: 06/08/2018  PT End of Session - 06/08/18 1015    Visit Number  1    Number of Visits  10    Date for PT Re-Evaluation  07/13/18    Authorization Type  Medicare & BCBS    PT Start Time  6384    PT Stop Time  1111    PT Time Calculation (min)  56 min    Activity Tolerance  Patient tolerated treatment well    Behavior During Therapy  [facility] for tasks assessed/performed       Past Medical History:  Diagnosis Date  . Anemia   . Axillary mass 7/99   Left  . Barrett's esophagus   . Blood transfusion without reported diagnosis   . CARCINOMA, BREAST 1995   left  . Cervical spinal stenosis   . 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  . Headache(784.0)   . HEMORRHOIDS   . Hypertension   . Personal history of chemotherapy   . Personal history of radiation therapy     Past Surgical History:  Procedure Laterality Date  . BONE MARROW TRANSPLANT  1999  . BREAST LUMPECTOMY  11/95   left breast  . BUNIONECTOMY  3/09, 10/09   right and left  . COLON SURGERY    . COLONOSCOPY    . ERCP  01/21/2012   Procedure: ENDOSCOPIC RETROGRADE CHOLANGIOPANCREATOGRAPHY (ERCP);  Surgeon: Lafayette Dragon, MD;  Location: [facility];  Service: Endoscopy;  Laterality: [name];  . hemicolectomy  01/2005   right  . HYSTEROSCOPY  08/30/94  . INGUINAL HERNIA REPAIR    . LEFT HEART CATH AND CORONARY ANGIOGRAPHY [name] 05/21/2018   Procedure: LEFT HEART CATH AND CORONARY ANGIOGRAPHY;  Surgeon: Nelva Bush, MD;  Location: [facility];  Service: Cardiovascular;  Laterality: [name];  . NECK MASS EXCISION Left 7/99  . ROTATOR CUFF REPAIR Right 09/08/13   full tear  . TOTAL ABDOMINAL  HYSTERECTOMY W/ BILATERAL SALPINGOOPHORECTOMY  1996  . TUNNELED VENOUS CATHETER PLACEMENT  9/98    There were no vitals filed for this visit.   Subjective Assessment - 06/08/18 1020    Subjective  Pt reports new onset of back pain with L LE pain starting in August w/o known MOI. First noted issues with side-stepping to L, but now bothers her with most standing activities.    Patient is accompained by:  Family member   husband   Limitations  Standing;Walking;Lifting;House hold activities    How long can you stand comfortably?  10 minutes    Diagnostic tests  03/17/18 - Lumbar x-ray: Lumbar spine scoliosis and degenerative change. No acute bony abnormalities; Negative L hip x-ray.    Patient Stated Goals  "no pain"    Currently in Pain?  Yes    Pain Score  4     Pain Location  Back    Pain Orientation  Left;Lower    Pain Descriptors / Indicators  Aching    Pain Type  Acute pain    Pain Radiating Towards  pain down back of L LE - typically only in thigh, but occasionly down into calf    Pain Onset  More than a month ago  Aug 2019   Pain Frequency  Intermittent    Aggravating Factors   prolonged standing, bending    Pain Relieving Factors  sitting down, heating pad    Effect of Pain on Daily Activities  not as active; more fatigued; limited standing tolerance         [facility] PT Assessment - 06/08/18 1015      Assessment   Medical Diagnosis  L SIJ dysfunction    Referring Provider (PT)  Melina Schools, MD    Next MD Visit  07/14/18    Prior Therapy  h/o PT x 2 for R shoulder s/p RTC surgeries      Balance Screen   Has the patient fallen in the past 6 months  No    Has the patient had a decrease in activity level because of a fear of falling?   No    Is the patient reluctant to leave their home because of a fear of falling?   No      Home Environment   Living Environment  Private residence    Living Arrangements  Spouse/significant other    Type of [place] to enter    Entrance Stairs-Number of Steps  3-4    Home Layout  One level      Prior Function   Level of Independence  Independent    Vocation  Retired    Leisure  gym 5 days/wk - walking/running, [employer]      Observation/Other Assessments   Focus on Therapeutic Outcomes (FOTO)   Lumbar spine - 60% (40% limitation); predicted 72% (28% limitation)      Posture/Postural Control   Posture/Postural Control  Postural limitations    Postural Limitations  Decreased lumbar lordosis   mild lumbar scoliosis     ROM / Strength   AROM / PROM / Strength  AROM;Strength      AROM   AROM Assessment Site  Lumbar    Lumbar Flexion  hands to ankles    Lumbar Extension  50% limited    Lumbar - Right Side Bend  hand to lateral joint line of knee    Lumbar - Left Side Bend  hand to lateral joint line of knee    Lumbar - Right Rotation  30% limited    Lumbar - Left Rotation  [facility]      Strength   Strength Assessment Site  Hip;Knee    Right/Left Hip  Right;Left    Right Hip Flexion  4+/5    Right Hip Extension  4/5    Right Hip External Rotation   4/5    Right Hip Internal Rotation  4/5    Right Hip ABduction  4+/5    Right Hip ADduction  4+/5    Left Hip Flexion  4-/5    Left Hip Extension  4-/5    Left Hip External Rotation  4/5    Left Hip Internal Rotation  4/5    Left Hip ABduction  4/5    Left Hip ADduction  4-/5    Right/Left Knee  Right;Left    Right Knee Flexion  4+/5    Right Knee Extension  4+/5    Left Knee Flexion  4-/5    Left Knee Extension  4+/5      Flexibility   Soft Tissue Assessment /Muscle Length  yes    Hamstrings  very mild tightness B    Quadriceps  [facility]  ITB  WFL    Piriformis  mod tight B      Palpation   SI assessment   apparent L anterior innominate rotation of pelvis on sacrum    Palpation comment  ttp over lumbar paraspinals, B glutes & piriformis (L>R)                Objective measurements completed on examination: See above  findings.      [facility] PT Treatment/Exercise - 06/08/18 1015      Exercises   Exercises  Lumbar      Lumbar Exercises: Stretches   Passive Hamstring Stretch  Left;30 seconds;1 rep    Piriformis Stretch  Left;30 seconds;1 rep    Piriformis Stretch Limitations  KTOS    Figure 4 Stretch  30 seconds;1 rep;Supine;With overpressure    Figure 4 Stretch Limitations  knees to chest      Lumbar Exercises: Supine   Bridge  10 reps;5 seconds    Bridge Limitations  + hip ABD isometric with red TB    Other Supine Lumbar Exercises  B hip adduction pillow squeeze 10 x 5"             PT Education - 06/08/18 1110    Education Details  PT eval findings, anticipated POC & initial HEP    Person(s) Educated  Patient    Methods  Explanation;Demonstration;Handout    Comprehension  Verbalized understanding;Returned demonstration       PT Short Term Goals - 06/08/18 1111      PT SHORT TERM GOAL #1   Title  Independent with initial HEP    Status  New    Target Date  06/22/18      PT SHORT TERM GOAL #2   Title  Patient to demonstrate appropriate posture and body mechanics needed for daily activities    Status  New    Target Date  06/29/18        PT Long Term Goals - 06/08/18 1111      PT LONG TERM GOAL #1   Title  Independent with ongoing/advanced HEP +/- gym program    Status  New    Target Date  07/13/18      PT LONG TERM GOAL #2   Title  Lumbar AROM WFL w/o pain provocation    Status  New    Target Date  07/13/18      PT LONG TERM GOAL #3   Title  B hip and knee strength grossly 4+/5 for improved proximal stability    Status  New    Target Date  07/13/18      PT LONG TERM GOAL #4   Title  Patient will report ability to stand for >/= 15-20 minutes w/o increased low back or L LE pain to improve tolerance for household chores    Status  New    Target Date  07/13/18      PT LONG TERM GOAL #5   Title  Patient to report ability to perform ADLs, household and  recreational tasks with >/= 50% reduction in low back or L LE pain             Plan - 06/08/18 1124    Clinical Impression Statement  Eileen [name] is a 72 y/o female who presents to OP PT for low back and L LE pain secondary to L SIJ dysfunction. Pt reports onset of pain ~August 2019 w/o known MOI and states pain mostly limits standing tolerance with  household chores such as washing dishes. Spinal/SIJ assessment revealed mild lumbar scoliosis with an apparent L anterior innominate rotation of pelvis on sacrum resulting in functional shortening of L LE, which was partially resolved with MET. Lumbar ROM grossly WFL with exceptions of limited extension and R rotation. Mild limited proximal LE flexibility apparent with mild to moderate proximal LE weakness also noted. Eileen [name] from skilled PT intervention to address the above listed deficits and to allow for improved functional mobility and standing tolerance with decreased pain.    Clinical Presentation  Stable    Clinical Decision Making  Low    Rehab Potential  Good    PT Frequency  2x / week    PT Duration  --   5 weeks   PT Treatment/Interventions  Patient/family education;ADLs/Self Care Home Management;Neuromuscular re-education;Therapeutic exercise;Therapeutic activities;Functional mobility training;Electrical Stimulation;Moist Heat;Traction;Iontophoresis 39m/ml Dexamethasone;Manual techniques;Passive range of motion;Dry needling;Taping    Consulted and Agree with Plan of Care  Patient;Family member/caregiver    Family Member Consulted  husband       Patient will [name] from skilled therapeutic intervention in order to improve the following deficits and impairments:  Pain, Impaired flexibility, Increased muscle spasms, Decreased range of motion, Decreased strength, Postural dysfunction, Improper body mechanics, Decreased activity tolerance  Visit Diagnosis: Acute left-sided low back pain with left-sided sciatica  Other  symptoms and signs involving the musculoskeletal system  Muscle weakness (generalized)     Problem List Patient Active Problem List   Diagnosis Date Noted  . Chronic systolic heart failure ([place] 05/21/2018  . Cardiomyopathy ([place]   . Hypertension 11/06/2017  . Complete rotator cuff rupture of left shoulder 09/21/2014  . Abdominal pain, epigastric 08/24/2013  . Belching 08/24/2013  . Other abnormal blood chemistry 01/21/2012  . Nonspecific (abnormal) findings on radiological and other examination of biliary tract 01/21/2012  . TIA (transient ischemic attack) 11/05/2011  . PVC's (premature ventricular contractions) 10/11/2011  . Nonspecific abnormal unspecified cardiovascular function study 12/20/2010  . Malignant neoplasm of female breast ([place] 08/23/2008  . HEMORRHOIDS 08/23/2008  . CONSTIPATION, CHRONIC 08/23/2008  . RECTAL BLEEDING 08/23/2008  . Malignant neoplasm of colon ([place] 01/26/2005    JPercival Spanish PT, MPT 06/08/2018, 12:13 PM  [facility] [place] [address]Phone: [phone]  Fax:  [phone] Name: ERenata GambinoMRN: [id number] of Birth: [birth year]

## 2018-06-11 ENCOUNTER — Ambulatory Visit: Payer: Medicare Other | Admitting: Physical Therapy

## 2018-06-11 DIAGNOSIS — Z23 Encounter for immunization: Secondary | ICD-10-CM | POA: Diagnosis not present

## 2018-06-11 DIAGNOSIS — L639 Alopecia areata, unspecified: Secondary | ICD-10-CM | POA: Diagnosis not present

## 2018-06-11 DIAGNOSIS — L723 Sebaceous cyst: Secondary | ICD-10-CM | POA: Diagnosis not present

## 2018-06-11 DIAGNOSIS — L821 Other seborrheic keratosis: Secondary | ICD-10-CM | POA: Diagnosis not present

## 2018-06-11 DIAGNOSIS — Z85828 Personal history of other malignant neoplasm of skin: Secondary | ICD-10-CM | POA: Diagnosis not present

## 2018-06-11 DIAGNOSIS — D2261 Melanocytic nevi of right upper limb, including shoulder: Secondary | ICD-10-CM | POA: Diagnosis not present

## 2018-06-11 DIAGNOSIS — L819 Disorder of pigmentation, unspecified: Secondary | ICD-10-CM | POA: Diagnosis not present

## 2018-06-15 ENCOUNTER — Telehealth: Payer: Self-pay | Admitting: Cardiology

## 2018-06-15 NOTE — Telephone Encounter (Signed)
Patient is calling for Holter monitor results.

## 2018-06-15 NOTE — Telephone Encounter (Signed)
Spoke with pt. Pt aware of holter monitor results with verbal understanding.  Notes recorded by Lelon Perla, MD on 06/08/2018 at 7:42 AM EST No arrhythmia that would explain cardiomyopathy; continue present meds [facility]

## 2018-06-16 ENCOUNTER — Encounter: Payer: Self-pay | Admitting: Physical Therapy

## 2018-06-16 ENCOUNTER — Ambulatory Visit: Payer: Medicare Other | Admitting: Physical Therapy

## 2018-06-16 DIAGNOSIS — M5442 Lumbago with sciatica, left side: Secondary | ICD-10-CM

## 2018-06-16 DIAGNOSIS — M6281 Muscle weakness (generalized): Secondary | ICD-10-CM | POA: Diagnosis not present

## 2018-06-16 DIAGNOSIS — R29898 Other symptoms and signs involving the musculoskeletal system: Secondary | ICD-10-CM | POA: Diagnosis not present

## 2018-06-16 MED FILL — TACROLIMUS 0.1 % OINT: 0.1 | 30 days supply | Qty: 30 | Fill #0

## 2018-06-16 NOTE — Therapy (Signed)
[facility] [address]  [place], [place], [address] Phone: [phone]   Fax:  [phone]  Physical Therapy Treatment  Patient Details  Name: Eileen [name] MRN: [id number] Date of Birth: [birth year] Referring Provider (PT): Melina Schools, MD   Encounter Date: 06/16/2018  PT End of Session - 06/16/18 0802    Visit Number  2    Number of Visits  10    Date for PT Re-Evaluation  07/13/18    Authorization Type  Medicare & BCBS    PT Start Time  0802    PT Stop Time  0846    PT Time Calculation (min)  44 min    Activity Tolerance  Patient tolerated treatment well    Behavior During Therapy  [facility] for tasks assessed/performed       Past Medical History:  Diagnosis Date  . Anemia   . Axillary mass 7/99   Left  . Barrett's esophagus   . Blood transfusion without reported diagnosis   . CARCINOMA, BREAST 1995   left  . Cervical spinal stenosis   . 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  . Headache(784.0)   . HEMORRHOIDS   . Hypertension   . Personal history of chemotherapy   . Personal history of radiation therapy     Past Surgical History:  Procedure Laterality Date  . BONE MARROW TRANSPLANT  1999  . BREAST LUMPECTOMY  11/95   left breast  . BUNIONECTOMY  3/09, 10/09   right and left  . COLON SURGERY    . COLONOSCOPY    . ERCP  01/21/2012   Procedure: ENDOSCOPIC RETROGRADE CHOLANGIOPANCREATOGRAPHY (ERCP);  Surgeon: Lafayette Dragon, MD;  Location: [facility];  Service: Endoscopy;  Laterality: [name];  . hemicolectomy  01/2005   right  . HYSTEROSCOPY  08/30/94  . INGUINAL HERNIA REPAIR    . LEFT HEART CATH AND CORONARY ANGIOGRAPHY [name] 05/21/2018   Procedure: LEFT HEART CATH AND CORONARY ANGIOGRAPHY;  Surgeon: Nelva Bush, MD;  Location: [facility];  Service: Cardiovascular;  Laterality: [name];  . NECK MASS EXCISION Left 7/99  . ROTATOR CUFF REPAIR Right 09/08/13   full tear  . TOTAL ABDOMINAL  HYSTERECTOMY W/ BILATERAL SALPINGOOPHORECTOMY  1996  . TUNNELED VENOUS CATHETER PLACEMENT  9/98    There were no vitals filed for this visit.  Subjective Assessment - 06/16/18 0805    Subjective  Pt reports she typically does not have pain in the morning, but finds it comes on as the day progresses.    Limitations  Standing;Walking;Lifting;House hold activities    Diagnostic tests  03/17/18 - Lumbar x-ray: Lumbar spine scoliosis and degenerative change. No acute bony abnormalities; Negative L hip x-ray.    Patient Stated Goals  "no pain"    Currently in Pain?  No/denies                       [facility] Adult PT Treatment/Exercise - 06/16/18 0802      Exercises   Exercises  Lumbar      Lumbar Exercises: Stretches   Passive Hamstring Stretch  Left;Right;30 seconds;1 rep    Passive Hamstring Stretch Limitations  supine with strap - cues to keep knee mostly straight    Piriformis Stretch  Left;Right;30 seconds;1 rep    Piriformis Stretch Limitations  KTOS - cues for direction of pull    Figure 4 Stretch  30 seconds;1 rep;Supine;With overpressure  Figure 4 Stretch Limitations  knees to chest - cues for proper hand placement for pull      Lumbar Exercises: Aerobic   Recumbent Bike  L1 x 6 min      Lumbar Exercises: Standing   Wall Slides  10 reps;3 seconds    Wall Slides Limitations  PT guarding to prevent feet from sliding and avoid excessive depth of squat    Row  Both;10 reps;Theraband;Strengthening    Theraband Level (Row)  Level 2 (Red)    Row Limitations  cues for abd bracing & scap retraction    Shoulder Extension  Both;10 reps;Theraband;Strengthening    Theraband Level (Shoulder Extension)  Level 2 (Red)    Shoulder Extension Limitations  cues for abd bracing & scap retraction    Other Standing Lumbar Exercises  R/L glute medius 45 dg hip extension 10 x 3"      Lumbar Exercises: Supine   Bridge  10 reps;5 seconds    Bridge Limitations  + hip ABD isometric with  red TB    Bridge with [employer]  10 reps;5 seconds    Bridge with clamshell  10 reps;5 seconds    Bridge with [employer] Limitations  alt hip ABD/ER with red Tb      Manual Therapy   Manual Therapy  Muscle Energy Technique    Muscle Energy Technique  MET to correct apparent L anterior innominate rotation of pelvis on sacrum - alignnment normalized following MET               PT Short Term Goals - 06/16/18 0806      PT SHORT TERM GOAL #1   Title  Independent with initial HEP    Status  On-going      PT SHORT TERM GOAL #2   Title  Patient to demonstrate appropriate posture and body mechanics needed for daily activities    Status  On-going        PT Long Term Goals - 06/16/18 0807      PT LONG TERM GOAL #1   Title  Independent with ongoing/advanced HEP +/- gym program    Status  On-going    Target Date  07/13/18      PT LONG TERM GOAL #2   Title  Lumbar AROM WFL w/o pain provocation    Status  On-going    Target Date  07/13/18      PT LONG TERM GOAL #3   Title  B hip and knee strength grossly 4+/5 for improved proximal stability    Status  On-going    Target Date  07/13/18      PT LONG TERM GOAL #4   Title  Patient will report ability to stand for >/= 15-20 minutes w/o increased low back or L LE pain to improve tolerance for household chores    Status  On-going    Target Date  07/13/18      PT LONG TERM GOAL #5   Title  Patient to report ability to perform ADLs, household and recreational tasks with >/= 50% reduction in low back or L LE pain    Status  On-going    Target Date  07/13/18            Plan - 06/16/18 0808    Clinical Impression Statement  Initial HEP reviewed with multiple clarifications necessary for stretches but better recall apparent with lumbopelvic strengthening exercises, although pt had self-progressed hip adduction squeeze to include bridge. SIJ  re-assessed with very mild L anterior innominate rotation of pelvis on sacrum noted  which was fully corrected with MET. Continued progression of lumbopelvic strengthening targeting proximal stability and maintenance of neutral pelvic alignment with good pt tolerance.    Rehab Potential  Good    PT Treatment/Interventions  Patient/family education;ADLs/Self Care Home Management;Neuromuscular re-education;Therapeutic exercise;Therapeutic activities;Functional mobility training;Electrical Stimulation;Moist Heat;Traction;Iontophoresis '4mg'$ /ml Dexamethasone;Manual techniques;Passive range of motion;Dry needling;Taping    PT Next Visit Plan  Re-assess SIJ & address alignment as indicated; progress lumbopelvic strengthening and standing tolerance activities    Consulted and Agree with Plan of Care  Patient       Patient will benefit from skilled therapeutic intervention in order to improve the following deficits and impairments:  Pain, Impaired flexibility, Increased muscle spasms, Decreased range of motion, Decreased strength, Postural dysfunction, Improper body mechanics, Decreased activity tolerance  Visit Diagnosis: Acute left-sided low back pain with left-sided sciatica  Other symptoms and signs involving the musculoskeletal system  Muscle weakness (generalized)     Problem List Patient Active Problem List   Diagnosis Date Noted  . Chronic systolic heart failure ([place]) 05/21/2018  . Cardiomyopathy ([place])   . Hypertension 11/06/2017  . Complete rotator cuff rupture of left shoulder 09/21/2014  . Abdominal pain, epigastric 08/24/2013  . Belching 08/24/2013  . Other abnormal blood chemistry 01/21/2012  . Nonspecific (abnormal) findings on radiological and other examination of biliary tract 01/21/2012  . TIA (transient ischemic attack) 11/05/2011  . PVC's (premature ventricular contractions) 10/11/2011  . Nonspecific abnormal unspecified cardiovascular function study 12/20/2010  . Malignant neoplasm of female breast ([place]) 08/23/2008  . HEMORRHOIDS 08/23/2008  .  CONSTIPATION, CHRONIC 08/23/2008  . RECTAL BLEEDING 08/23/2008  . Malignant neoplasm of colon ([place]) 01/26/2005    Percival Spanish, PT, MPT 06/16/2018, 8:48 AM  [facility] [place], [place], [address] Phone: [phone]   Fax:  [phone]  Name: Eileen [name] MRN: [id number] Date of Birth: 11-Mar-1946

## 2018-06-18 ENCOUNTER — Ambulatory Visit: Payer: Medicare Other

## 2018-06-18 DIAGNOSIS — M5442 Lumbago with sciatica, left side: Secondary | ICD-10-CM | POA: Diagnosis not present

## 2018-06-18 DIAGNOSIS — M6281 Muscle weakness (generalized): Secondary | ICD-10-CM | POA: Diagnosis not present

## 2018-06-18 DIAGNOSIS — R29898 Other symptoms and signs involving the musculoskeletal system: Secondary | ICD-10-CM | POA: Diagnosis not present

## 2018-06-18 NOTE — Patient Instructions (Signed)

## 2018-06-18 NOTE — Therapy (Signed)
[facility] [address]  [place], [place], [address] Phone: [phone]   Fax:  [phone]  Physical Therapy Treatment  Patient Details  Name: Eileen [name] MRN: [id number] Date of Birth: 03/06/1946 Referring Provider (PT): Melina Schools, MD   Encounter Date: 06/18/2018  PT End of Session - 06/18/18 0901    Visit Number  3    Number of Visits  10    Date for PT Re-Evaluation  07/13/18    Authorization Type  Medicare & BCBS    PT Start Time  [id number]    PT Stop Time  0940   Ended visit with 10 min moist heat   PT Time Calculation (min)  51 min    Activity Tolerance  Patient tolerated treatment well    Behavior During Therapy  [facility] for tasks assessed/performed       Past Medical History:  Diagnosis Date  . Anemia   . Axillary mass 7/99   Left  . Barrett's esophagus   . Blood transfusion without reported diagnosis   . CARCINOMA, BREAST 1995   left  . Cervical spinal stenosis   . 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  . Headache(784.0)   . HEMORRHOIDS   . Hypertension   . Personal history of chemotherapy   . Personal history of radiation therapy     Past Surgical History:  Procedure Laterality Date  . BONE MARROW TRANSPLANT  1999  . BREAST LUMPECTOMY  11/95   left breast  . BUNIONECTOMY  3/09, 10/09   right and left  . COLON SURGERY    . COLONOSCOPY    . ERCP  01/21/2012   Procedure: ENDOSCOPIC RETROGRADE CHOLANGIOPANCREATOGRAPHY (ERCP);  Surgeon: Lafayette Dragon, MD;  Location: [facility];  Service: Endoscopy;  Laterality: [name];  . hemicolectomy  01/2005   right  . HYSTEROSCOPY  08/30/94  . INGUINAL HERNIA REPAIR    . LEFT HEART CATH AND CORONARY ANGIOGRAPHY [name] 05/21/2018   Procedure: LEFT HEART CATH AND CORONARY ANGIOGRAPHY;  Surgeon: Nelva Bush, MD;  Location: [place] [facility];  Service: Cardiovascular;  Laterality: [name];  . NECK MASS EXCISION Left 7/99  . ROTATOR CUFF REPAIR Right 09/08/13    full tear  . TOTAL ABDOMINAL HYSTERECTOMY W/ BILATERAL SALPINGOOPHORECTOMY  1996  . TUNNELED VENOUS CATHETER PLACEMENT  9/98    There were no vitals filed for this visit.  Subjective Assessment - 06/18/18 0852    Subjective  Pt. noting she is still having signficant pain however reports this pain is more in L buttocks now and no longer around tailbone.    Diagnostic tests  03/17/18 - Lumbar x-ray: Lumbar spine scoliosis and degenerative change. No acute bony abnormalities; Negative L hip x-ray.    Currently in Pain?  No/denies    Pain Score  0-No pain   up to 4/10 pain at worst with chores at home   Pain Location  Buttocks   into L proximal posterior thigh   Pain Orientation  Left;Lower    Pain Descriptors / Indicators  Aching    Pain Type  Acute pain    Pain Radiating Towards  Occasional pain down posterior thigh no longer into calf     Pain Onset  More than a month ago    Pain Frequency  Intermittent    Aggravating Factors   Prolonged standing, bending    Pain Relieving Factors  Sitting down,     Multiple Pain  Sites  No                       [facility] Adult PT Treatment/Exercise - 06/18/18 0919      Self-Care   Self-Care  Other Self-Care Comments    Other Self-Care Comments   Reviewed posture and body mechanics handout as to reduce lumbar/hip strain with daily activities including kitchen work, vacuuming, and sleeping position      Lumbar Exercises: Stretches   Piriformis Stretch  Left;Right;30 seconds;1 rep    Piriformis Stretch Limitations  KTOS - cues for direction of pull    Figure 4 Stretch  30 seconds;1 rep;Supine;With overpressure      Lumbar Exercises: Aerobic   Recumbent Bike  L2 x 6 min      Lumbar Exercises: Machines for Strengthening   Cybex Knee Flexion  BATCA 20# x 15 reps     Other Lumbar Machine Exercise  BATCA low row 15# x 10 reps       Lumbar Exercises: Standing   Functional Squats  3 seconds   x 12 reps    Functional Squats Limitations   TRX      Knee/Hip Exercises: Seated   Sit to Sand  without UE support;10 reps   with green TB isometric at knees hip abd/ER      Moist Heat Therapy   Number Minutes Moist Heat  10 Minutes    Moist Heat Location  Lumbar Spine   also glutes     Manual Therapy   Manual Therapy  Soft tissue mobilization    Manual therapy comments  sidelying with L LE elevated on bolster     Soft tissue mobilization  STM to L buttocks  - no ttp                PT Short Term Goals - 06/18/18 0903      PT SHORT TERM GOAL #1   Title  Independent with initial HEP    Status  Achieved      PT SHORT TERM GOAL #2   Title  Patient to demonstrate appropriate posture and body mechanics needed for daily activities    Status  Achieved        PT Long Term Goals - 06/16/18 0807      PT LONG TERM GOAL #1   Title  Independent with ongoing/advanced HEP +/- gym program    Status  On-going    Target Date  07/13/18      PT LONG TERM GOAL #2   Title  Lumbar AROM WFL w/o pain provocation    Status  On-going    Target Date  07/13/18      PT LONG TERM GOAL #3   Title  B hip and knee strength grossly 4+/5 for improved proximal stability    Status  On-going    Target Date  07/13/18      PT LONG TERM GOAL #4   Title  Patient will report ability to stand for >/= 15-20 minutes w/o increased low back or L LE pain to improve tolerance for household chores    Status  On-going    Target Date  07/13/18      PT LONG TERM GOAL #5   Title  Patient to report ability to perform ADLs, household and recreational tasks with >/= 50% reduction in low back or L LE pain    Status  On-going    Target Date  07/13/18  Plan - 06/18/18 0906    Clinical Impression Statement  Pt. reporting some L buttocks pain today however not presenting with ttp in L proximal hip musculature with manual therapy.  Did report improvement in this pain with progression of LE stretching and standing strengthening activities.   Ended visit with trial of most heat to buttocks/lumbar spine with pt. noting improved comfort following this.  Tolerated all activities in session today well.  Will continue to progress toward goals.      PT Treatment/Interventions  Patient/family education;ADLs/Self Care Home Management;Neuromuscular re-education;Therapeutic exercise;Therapeutic activities;Functional mobility training;Electrical Stimulation;Moist Heat;Traction;Iontophoresis 4mg /ml Dexamethasone;Manual techniques;Passive range of motion;Dry needling;Taping    Consulted and Agree with Plan of Care  Patient    Family Member Consulted  husband       Patient will benefit from skilled therapeutic intervention in order to improve the following deficits and impairments:  Pain, Impaired flexibility, Increased muscle spasms, Decreased range of motion, Decreased strength, Postural dysfunction, Improper body mechanics, Decreased activity tolerance  Visit Diagnosis: Acute left-sided low back pain with left-sided sciatica  Other symptoms and signs involving the musculoskeletal system  Muscle weakness (generalized)     Problem List Patient Active Problem List   Diagnosis Date Noted  . Chronic systolic heart failure ([place]) 05/21/2018  . Cardiomyopathy ([place])   . Hypertension 11/06/2017  . Complete rotator cuff rupture of left shoulder 09/21/2014  . Abdominal pain, epigastric 08/24/2013  . Belching 08/24/2013  . Other abnormal blood chemistry 01/21/2012  . Nonspecific (abnormal) findings on radiological and other examination of biliary tract 01/21/2012  . TIA (transient ischemic attack) 11/05/2011  . PVC's (premature ventricular contractions) 10/11/2011  . Nonspecific abnormal unspecified cardiovascular function study 12/20/2010  . Malignant neoplasm of female breast ([place]) 08/23/2008  . HEMORRHOIDS 08/23/2008  . CONSTIPATION, CHRONIC 08/23/2008  . RECTAL BLEEDING 08/23/2008  . Malignant neoplasm of colon [facility]) 01/26/2005     Bess Harvest, PTA 06/18/18 12:58 PM   [facility] [address]  [place], [place], [address] Phone: [phone]   Fax:  [phone]  Name: Haileyann Staiger MRN: [id number] Date of Birth: 29-Sep-1945

## 2018-06-23 ENCOUNTER — Ambulatory Visit: Payer: Medicare Other | Admitting: Physical Therapy

## 2018-06-23 ENCOUNTER — Encounter: Payer: Self-pay | Admitting: Physical Therapy

## 2018-06-23 DIAGNOSIS — M5442 Lumbago with sciatica, left side: Secondary | ICD-10-CM | POA: Diagnosis not present

## 2018-06-23 DIAGNOSIS — R29898 Other symptoms and signs involving the musculoskeletal system: Secondary | ICD-10-CM | POA: Diagnosis not present

## 2018-06-23 DIAGNOSIS — M6281 Muscle weakness (generalized): Secondary | ICD-10-CM

## 2018-06-23 NOTE — Therapy (Signed)
[facility] [address]  [place], [place], [address] Phone: [phone]   Fax:  [phone]  Physical Therapy Treatment  Patient Details  Name: Eileen [name] MRN: [id number] Date of Birth: 1946-04-08 Referring Provider (PT): Melina Schools, MD   Encounter Date: 06/23/2018  PT End of Session - 06/23/18 0800    Visit Number  4    Number of Visits  10    Date for PT Re-Evaluation  07/13/18    Authorization Type  Medicare & BCBS    PT Start Time  0800    PT Stop Time  0902    PT Time Calculation (min)  62 min    Activity Tolerance  Patient tolerated treatment well    Behavior During Therapy  [facility] for tasks assessed/performed       Past Medical History:  Diagnosis Date  . Anemia   . Axillary mass 7/99   Left  . Barrett's esophagus   . Blood transfusion without reported diagnosis   . CARCINOMA, BREAST 1995   left  . Cervical spinal stenosis   . 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  . Headache(784.0)   . HEMORRHOIDS   . Hypertension   . Personal history of chemotherapy   . Personal history of radiation therapy     Past Surgical History:  Procedure Laterality Date  . BONE MARROW TRANSPLANT  1999  . BREAST LUMPECTOMY  11/95   left breast  . BUNIONECTOMY  3/09, 10/09   right and left  . COLON SURGERY    . COLONOSCOPY    . ERCP  01/21/2012   Procedure: ENDOSCOPIC RETROGRADE CHOLANGIOPANCREATOGRAPHY (ERCP);  Surgeon: Lafayette Dragon, MD;  Location: [facility];  Service: Endoscopy;  Laterality: [name];  . hemicolectomy  01/2005   right  . HYSTEROSCOPY  08/30/94  . INGUINAL HERNIA REPAIR    . LEFT HEART CATH AND CORONARY ANGIOGRAPHY [name] 05/21/2018   Procedure: LEFT HEART CATH AND CORONARY ANGIOGRAPHY;  Surgeon: Nelva Bush, MD;  Location: [place] [facility];  Service: Cardiovascular;  Laterality: [name];  . NECK MASS EXCISION Left 7/99  . ROTATOR CUFF REPAIR Right 09/08/13   full tear  . TOTAL ABDOMINAL  HYSTERECTOMY W/ BILATERAL SALPINGOOPHORECTOMY  1996  . TUNNELED VENOUS CATHETER PLACEMENT  9/98    There were no vitals filed for this visit.  Subjective Assessment - 06/23/18 0801    Subjective  Pt not yet noting much difference with PT in pain levels - still typically no pain in mornings, but worsens as the day progresses.    Diagnostic tests  03/17/18 - Lumbar x-ray: Lumbar spine scoliosis and degenerative change. No acute bony abnormalities; Negative L hip x-ray.    Currently in Pain?  No/denies                       [facility] Adult PT Treatment/Exercise - 06/23/18 0800      Exercises   Exercises  Lumbar      Lumbar Exercises: Stretches   Piriformis Stretch  Left;30 seconds;2 reps    Piriformis Stretch Limitations  supine KTOS    Figure 4 Stretch  30 seconds;2 reps;Supine;With overpressure    Figure 4 Stretch Limitations  knees to chest      Lumbar Exercises: Aerobic   Recumbent Bike  L2 x 6 min      Lumbar Exercises: Supine   Bridge  15 reps;5 seconds    Bridge  Limitations  + hip ABD isometric with red TB      Lumbar Exercises: Sidelying   Clam  Left;10 reps;3 seconds    Clam Limitations  red TB      Modalities   Modalities  Electrical Stimulation;Moist Heat      Moist Heat Therapy   Number Minutes Moist Heat  15 Minutes    Moist Heat Location  Lumbar Spine   & glutes/piriformis     Electrical Stimulation   Electrical Stimulation Location  L glutes/piriformis    Electrical Stimulation Action  IFC    Electrical Stimulation Parameters  80-150 Hz, intensity to pt tol x 15'    Electrical Stimulation Goals  Pain;Tone      Manual Therapy   Manual Therapy  Muscle Energy Technique;Soft tissue mobilization;Myofascial release    Soft tissue mobilization  STM to L glutes & prirformis    Myofascial Release  manual TPR to glute minimus & pirifformis    Muscle Energy Technique  MET to correct apparent L posterior innominate rotation of pelvis on sacrum followed  by pelvic shotgun - alignnment normalized following MET       Trigger Point Dry Needling - 06/23/18 0800    Consent Given?  Yes    Education Handout Provided  Yes    Muscles Treated Lower Body  Gluteus minimus;Piriformis    Gluteus Minimus Response  Twitch response elicited;Palpable increased muscle length   Lt   Piriformis Response  Twitch response elicited;Palpable increased muscle length   Lt medial & lateral piriformis            PT Short Term Goals - 06/18/18 0903      PT SHORT TERM GOAL #1   Title  Independent with initial HEP    Status  Achieved      PT SHORT TERM GOAL #2   Title  Patient to demonstrate appropriate posture and body mechanics needed for daily activities    Status  Achieved        PT Long Term Goals - 06/16/18 0807      PT LONG TERM GOAL #1   Title  Independent with ongoing/advanced HEP +/- gym program    Status  On-going    Target Date  07/13/18      PT LONG TERM GOAL #2   Title  Lumbar AROM WFL w/o pain provocation    Status  On-going    Target Date  07/13/18      PT LONG TERM GOAL #3   Title  B hip and knee strength grossly 4+/5 for improved proximal stability    Status  On-going    Target Date  07/13/18      PT LONG TERM GOAL #4   Title  Patient will report ability to stand for >/= 15-20 minutes w/o increased low back or L LE pain to improve tolerance for household chores    Status  On-going    Target Date  07/13/18      PT LONG TERM GOAL #5   Title  Patient to report ability to perform ADLs, household and recreational tasks with >/= 50% reduction in low back or L LE pain    Status  On-going    Target Date  07/13/18            Plan - 06/23/18 0804    Clinical Impression Statement  Ki not noting much change thus far with PT, reporting pain still minimal in the mornings, but increasing as the  day progresses. Reassessed SIJ alignment with pt demonstrating apparent L posterior innominate rotation of pelvis on sacrum  which was successfully corrected with MET. Taut bands with ttp identified in L glutes (minimus) & pirifomis - addressed with manual therapy includiing STM/MFR and DN upon informed pt consent with positive twitch response elicited and palpable reduction in muscle tension and ttp. Reviewed relevant streches & exercises and completed session with estim and moist heat with pt reporting good relief by end of session.    Rehab Potential  Good    PT Treatment/Interventions  Patient/family education;ADLs/Self Care Home Management;Neuromuscular re-education;Therapeutic exercise;Therapeutic activities;Functional mobility training;Electrical Stimulation;Moist Heat;Traction;Iontophoresis 59m/ml Dexamethasone;Manual techniques;Passive range of motion;Dry needling;Taping    PT Next Visit Plan  Re-assess SIJ & address alignment as indicated; progress lumbopelvic strengthening and standing tolerance activities    Consulted and Agree with Plan of Care  Patient       Patient will benefit from skilled therapeutic intervention in order to improve the following deficits and impairments:  Pain, Impaired flexibility, Increased muscle spasms, Decreased range of motion, Decreased strength, Postural dysfunction, Improper body mechanics, Decreased activity tolerance  Visit Diagnosis: Acute left-sided low back pain with left-sided sciatica  Other symptoms and signs involving the musculoskeletal system  Muscle weakness (generalized)     Problem List Patient Active Problem List   Diagnosis Date Noted  . Chronic systolic heart failure ([place] 05/21/2018  . Cardiomyopathy ([place]   . Hypertension 11/06/2017  . Complete rotator cuff rupture of left shoulder 09/21/2014  . Abdominal pain, epigastric 08/24/2013  . Belching 08/24/2013  . Other abnormal blood chemistry 01/21/2012  . Nonspecific (abnormal) findings on radiological and other examination of biliary tract 01/21/2012  . TIA (transient ischemic attack) 11/05/2011   . PVC's (premature ventricular contractions) 10/11/2011  . Nonspecific abnormal unspecified cardiovascular function study 12/20/2010  . Malignant ne[place] of [place] 08/23/2008  . HEMORRHOIDS 08/23/2008  . CONSTIPATION, CHRONIC 08/23/2008  . RECTAL BLEEDING 08/23/2008  . Malignant neoplasm of colon ([place] 01/26/2005    JPercival Spanish PT, MPT 06/23/2018, 9:23 AM  [facility] [address]Phone: [phone]  Fax:  [phone] Name: EAynsley FleetMRN: [id number] of Birth: [birth year]

## 2018-06-23 NOTE — Patient Instructions (Signed)

## 2018-06-30 ENCOUNTER — Ambulatory Visit: Payer: Medicare Other | Attending: Orthopedic Surgery | Admitting: Physical Therapy

## 2018-06-30 DIAGNOSIS — R29898 Other symptoms and signs involving the musculoskeletal system: Secondary | ICD-10-CM | POA: Diagnosis not present

## 2018-06-30 DIAGNOSIS — M5442 Lumbago with sciatica, left side: Secondary | ICD-10-CM | POA: Insufficient documentation

## 2018-06-30 DIAGNOSIS — M6281 Muscle weakness (generalized): Secondary | ICD-10-CM | POA: Diagnosis not present

## 2018-06-30 NOTE — Therapy (Signed)
[facility] [address]  [place], [place], [address] Phone: [phone]   Fax:  [phone]  Physical Therapy Treatment  Patient Details  Name: Eileen [name] MRN: [id number] Date of Birth: 07-31-1945 Referring Provider (PT): Melina Schools, MD   Encounter Date: 06/30/2018  PT End of Session - 06/30/18 0846    Visit Number  5    Number of Visits  10    Date for PT Re-Evaluation  07/13/18    Authorization Type  Medicare & BCBS    PT Start Time  [phone]    PT Stop Time  0948    PT Time Calculation (min)  62 min    Activity Tolerance  Patient tolerated treatment well    Behavior During Therapy  [facility] for tasks assessed/performed       Past Medical History:  Diagnosis Date  . Anemia   . Axillary mass 7/99   Left  . Barrett's esophagus   . Blood transfusion without reported diagnosis   . CARCINOMA, BREAST 1995   left  . Cervical spinal stenosis   . 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  . Headache(784.0)   . HEMORRHOIDS   . Hypertension   . Personal history of chemotherapy   . Personal history of radiation therapy     Past Surgical History:  Procedure Laterality Date  . BONE MARROW TRANSPLANT  1999  . BREAST LUMPECTOMY  11/95   left breast  . BUNIONECTOMY  3/09, 10/09   right and left  . COLON SURGERY    . COLONOSCOPY    . ERCP  01/21/2012   Procedure: ENDOSCOPIC RETROGRADE CHOLANGIOPANCREATOGRAPHY (ERCP);  Surgeon: Lafayette Dragon, MD;  Location: [facility];  Service: Endoscopy;  Laterality: [name];  . hemicolectomy  01/2005   right  . HYSTEROSCOPY  08/30/94  . INGUINAL HERNIA REPAIR    . LEFT HEART CATH AND CORONARY ANGIOGRAPHY [name] 05/21/2018   Procedure: LEFT HEART CATH AND CORONARY ANGIOGRAPHY;  Surgeon: Nelva Bush, MD;  Location: [facility];  Service: Cardiovascular;  Laterality: [name];  . NECK MASS EXCISION Left 7/99  . ROTATOR CUFF REPAIR Right 09/08/13   full tear  . TOTAL ABDOMINAL  HYSTERECTOMY W/ BILATERAL SALPINGOOPHORECTOMY  1996  . TUNNELED VENOUS CATHETER PLACEMENT  9/98    There were no vitals filed for this visit.  Subjective Assessment - 06/30/18 0849    Subjective  Pt not noting much difference following DN last session - still w/o pain in the mornings, but pain worsening as the day progresses.    Diagnostic tests  03/17/18 - Lumbar x-ray: Lumbar spine scoliosis and degenerative change. No acute bony abnormalities; Negative L hip x-ray.    Patient Stated Goals  "no pain"    Currently in Pain?  No/denies    Pain Score  --   up to 6-7/10 at worst later in the day   Pain Location  Buttocks    Pain Orientation  Left;Lower    Pain Descriptors / Indicators  Aching    Pain Type  Acute pain    Pain Frequency  Intermittent                       [facility] Adult PT Treatment/Exercise - 06/30/18 0846      Exercises   Exercises  Lumbar      Lumbar Exercises: Aerobic   Recumbent Bike  L2 x 6 min  Lumbar Exercises: Standing   Functional Squats  10 reps;3 seconds    Functional Squats Limitations  counter squat with red looped TB at knees - cues to avoid knees passing fwd of toes    Other Standing Lumbar Exercises  R/L glute medius 45 dg hip extension with red TB at ankles 10 x 3"      Lumbar Exercises: Sidelying   Clam  Left;10 reps;3 seconds    Clam Limitations  red TB      Modalities   Modalities  Electrical Stimulation;Moist Heat      Moist Heat Therapy   Number Minutes Moist Heat  15 Minutes    Moist Heat Location  Lumbar Spine   & glutes/piriformis     Electrical Stimulation   Electrical Stimulation Location  L glutes/piriformis    Electrical Stimulation Action  IFC    Electrical Stimulation Parameters  80-150 Hz, intensity to pt tol x 15'    Electrical Stimulation Goals  Pain;Tone      Manual Therapy   Manual Therapy  Soft tissue mobilization;Myofascial release    Manual therapy comments  sidelying with L LE elevated on bolster      Soft tissue mobilization  STM to L glutes & prirformis    Myofascial Release  manual TPR to lateral glute minimus/medius & pirifformis       Trigger Point Dry Needling - 06/30/18 0846    Consent Given?  Yes    Muscles Treated Lower Body  Gluteus minimus;Gluteus maximus;Piriformis    Gluteus Maximus Response  Twitch response elicited;Palpable increased muscle length   + L glute medius   Gluteus Minimus Response  Twitch response elicited;Palpable increased muscle length   Lt   Piriformis Response  Twitch response elicited;Palpable increased muscle length   Lt lateral piriformis          PT Education - 06/30/18 0930    Education Details  HEP update    Person(s) Educated  Patient    Methods  Explanation;Demonstration;Handout    Comprehension  Verbalized understanding;Returned demonstration;Need further instruction       PT Short Term Goals - 06/18/18 0903      PT SHORT TERM GOAL #1   Title  Independent with initial HEP    Status  Achieved      PT SHORT TERM GOAL #2   Title  Patient to demonstrate appropriate posture and body mechanics needed for daily activities    Status  Achieved        PT Long Term Goals - 06/16/18 0807      PT LONG TERM GOAL #1   Title  Independent with ongoing/advanced HEP +/- gym program    Status  On-going    Target Date  07/13/18      PT LONG TERM GOAL #2   Title  Lumbar AROM WFL w/o pain provocation    Status  On-going    Target Date  07/13/18      PT LONG TERM GOAL #3   Title  B hip and knee strength grossly 4+/5 for improved proximal stability    Status  On-going    Target Date  07/13/18      PT LONG TERM GOAL #4   Title  Patient will report ability to stand for >/= 15-20 minutes w/o increased low back or L LE pain to improve tolerance for household chores    Status  On-going    Target Date  07/13/18      PT LONG  TERM GOAL #5   Title  Patient to report ability to perform ADLs, household and recreational tasks with >/= 50%  reduction in low back or L LE pain    Status  On-going    Target Date  07/13/18            Plan - 06/30/18 0848    Clinical Impression Statement  Pt uncertain of benefit from DN last session but would like to try it again today, therefore assessed L glutes/piriformis with ongoing taut bands/TPs identified mostly laterally which were address with manual therapy and DN with good twitch response and reduction in ttp following this. Continued focus on proximal stretching and strengthening with HEP update provided today. Treatment concluded with estim and moist heat to promote further muscle relaxation..    Rehab Potential  Good    PT Treatment/Interventions  Patient/family education;ADLs/Self Care Home Management;Neuromuscular re-education;Therapeutic exercise;Therapeutic activities;Functional mobility training;Electrical Stimulation;Moist Heat;Traction;Iontophoresis 4mg /ml Dexamethasone;Manual techniques;Passive range of motion;Dry needling;Taping    PT Next Visit Plan  Re-assess SIJ & address alignment as indicated; progress lumbopelvic strengthening and standing tolerance activities    Consulted and Agree with Plan of Care  Patient       Patient will benefit from skilled therapeutic intervention in order to improve the following deficits and impairments:  Pain, Impaired flexibility, Increased muscle spasms, Decreased range of motion, Decreased strength, Postural dysfunction, Improper body mechanics, Decreased activity tolerance  Visit Diagnosis: Acute left-sided low back pain with left-sided sciatica  Other symptoms and signs involving the musculoskeletal system  Muscle weakness (generalized)     Problem List Patient Active Problem List   Diagnosis Date Noted  . Chronic systolic heart failure ([place]) 05/21/2018  . Cardiomyopathy ([place])   . Hypertension 11/06/2017  . Complete rotator cuff rupture of left shoulder 09/21/2014  . Abdominal pain, epigastric 08/24/2013  . Belching  08/24/2013  . Other abnormal blood chemistry 01/21/2012  . Nonspecific (abnormal) findings on radiological and other examination of biliary tract 01/21/2012  . TIA (transient ischemic attack) 11/05/2011  . PVC's (premature ventricular contractions) 10/11/2011  . Nonspecific abnormal unspecified cardiovascular function study 12/20/2010  . Malignant neoplasm of female breast ([place]) 08/23/2008  . HEMORRHOIDS 08/23/2008  . CONSTIPATION, CHRONIC 08/23/2008  . RECTAL BLEEDING 08/23/2008  . Malignant neoplasm of colon ([place]) 01/26/2005    Percival Spanish, PT, MPT 06/30/2018, 11:43 AM  [facility] [address]  [place], [place], [address] Phone: [phone]   Fax:  [phone]  Name: Eileen [name] MRN: [id number] Date of Birth: 12-15-1945

## 2018-07-02 ENCOUNTER — Ambulatory Visit: Payer: Medicare Other | Admitting: Physical Therapy

## 2018-07-02 ENCOUNTER — Encounter: Payer: Self-pay | Admitting: Physical Therapy

## 2018-07-02 DIAGNOSIS — R29898 Other symptoms and signs involving the musculoskeletal system: Secondary | ICD-10-CM

## 2018-07-02 DIAGNOSIS — M6281 Muscle weakness (generalized): Secondary | ICD-10-CM | POA: Diagnosis not present

## 2018-07-02 DIAGNOSIS — M5442 Lumbago with sciatica, left side: Secondary | ICD-10-CM

## 2018-07-02 NOTE — Therapy (Signed)
[facility] [address]  [place], [place], [address] Phone: [phone]   Fax:  [phone]  Physical Therapy Treatment  Patient Details  Name: Eileen [name] MRN: [id number] Date of Birth: [birth year] Referring Provider (PT): Melina Schools, MD   Encounter Date: 07/02/2018  PT End of Session - 07/02/18 1014    Visit Number  6    Number of Visits  10    Date for PT Re-Evaluation  07/13/18    Authorization Type  Medicare & BCBS    PT Start Time  3354    PT Stop Time  1056    PT Time Calculation (min)  42 min    Activity Tolerance  Patient tolerated treatment well    Behavior During Therapy  [facility] for tasks assessed/performed       Past Medical History:  Diagnosis Date  . Anemia   . Axillary mass 7/99   Left  . Barrett's esophagus   . Blood transfusion without reported diagnosis   . CARCINOMA, BREAST 1995   left  . Cervical spinal stenosis   . 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  . Headache(784.0)   . HEMORRHOIDS   . Hypertension   . Personal history of chemotherapy   . Personal history of radiation therapy     Past Surgical History:  Procedure Laterality Date  . BONE MARROW TRANSPLANT  1999  . BREAST LUMPECTOMY  11/95   left breast  . BUNIONECTOMY  3/09, 10/09   right and left  . COLON SURGERY    . COLONOSCOPY    . ERCP  01/21/2012   Procedure: ENDOSCOPIC RETROGRADE CHOLANGIOPANCREATOGRAPHY (ERCP);  Surgeon: Lafayette Dragon, MD;  Location: [facility];  Service: Endoscopy;  Laterality: [name];  . hemicolectomy  01/2005   right  . HYSTEROSCOPY  08/30/94  . INGUINAL HERNIA REPAIR    . LEFT HEART CATH AND CORONARY ANGIOGRAPHY [name] 05/21/2018   Procedure: LEFT HEART CATH AND CORONARY ANGIOGRAPHY;  Surgeon: Nelva Bush, MD;  Location: [facility];  Service: Cardiovascular;  Laterality: [name];  . NECK MASS EXCISION Left 7/99  . ROTATOR CUFF REPAIR Right 09/08/13   full tear  . TOTAL ABDOMINAL  HYSTERECTOMY W/ BILATERAL SALPINGOOPHORECTOMY  1996  . TUNNELED VENOUS CATHETER PLACEMENT  9/98    There were no vitals filed for this visit.  Subjective Assessment - 07/02/18 1015    Subjective  Pt reporting no issues with new HEP update. Still notes pain mostly late in the day, esp after doing "kitchen chores".    Diagnostic tests  03/17/18 - Lumbar x-ray: Lumbar spine scoliosis and degenerative change. No acute bony abnormalities; Negative L hip x-ray.    Patient Stated Goals  "no pain"    Currently in Pain?  No/denies                       [facility] Adult PT Treatment/Exercise - 07/02/18 1014      Self-Care   Self-Care  Other Self-Care Comments    Other Self-Care Comments   Pt reporting tendency to cross legs due to RLS - encouraged pt to try to avoid doing so, but if necessary to cross legs, try placing L on top.      Exercises   Exercises  Lumbar      Lumbar Exercises: Stretches   Quadruped Mid Back Stretch  30 seconds;1 rep   each   Quadruped Mid Back Stretch  Limitations  3 way prayer stretch      Lumbar Exercises: Aerobic   Recumbent Bike  L2 x 6 min      Lumbar Exercises: Supine   Bent Knee Raise  10 reps;3 seconds    Bent Knee Raise Limitations  cues for abdominal bracing w/o holding breath      Lumbar Exercises: Quadruped   [place]  10 reps    Straight Leg Raise  10 reps;3 seconds      Manual Therapy   Manual Therapy  Muscle Energy Technique;Soft tissue mobilization;Myofascial release;Taping    Manual therapy comments  prone & supine    Soft tissue mobilization  STM to L medial glutes & prirformis    Myofascial Release  manual TPR to medial L pirifformis    Muscle Energy Technique  MET to correct apparent L anterior innominate rotation of pelvis on sacrum - alignnment normalized following MET    [place];Inhibit Muscle      [employer]   Create Space  L SIJ: 30-50% star (3 strips)    Inhibit Muscle   L piriformis: 30% along  muscle belly + 50% perpendicular strip medially at area of greatest tenderness               PT Short Term Goals - 06/18/18 0903      PT SHORT TERM GOAL #1   Title  Independent with initial HEP    Status  Achieved      PT SHORT TERM GOAL #2   Title  Patient to demonstrate appropriate posture and body mechanics needed for daily activities    Status  Achieved        PT Long Term Goals - 07/02/18 1055      PT LONG TERM GOAL #1   Title  Independent with ongoing/advanced HEP +/- gym program    Status  Partially Met      PT LONG TERM GOAL #2   Title  Lumbar AROM WFL w/o pain provocation    Status  On-going      PT LONG TERM GOAL #3   Title  B hip and knee strength grossly 4+/5 for improved proximal stability    Status  On-going      PT LONG TERM GOAL #4   Title  Patient will report ability to stand for >/= 15-20 minutes w/o increased low back or L LE pain to improve tolerance for household chores    Status  On-going      PT LONG TERM GOAL #5   Title  Patient to report ability to perform ADLs, household and recreational tasks with >/= 50% reduction in low back or L LE pain    Status  On-going            Plan - 07/02/18 1017    Clinical Impression Statement  Waunita continues to deny pain early in the day, but states pain tends to worsen late the day, esp after working in the kitchen. SIJ alignment reassessed today with slight L anterior innominate rotation noted which was corrected with MET followed by manual STM/MFR to reduce abnormal muscle tension in medial glutes & piriformis. Initiated trial of kinesiotaping for SIJ pain and piriformis inhibition and will assess response on next visit. Pt reporting good comfort and compliance with current HEP - LTG partially met, with remaining LTGs still ongoing.    Rehab Potential  Good    PT Treatment/Interventions  Patient/family education;ADLs/Self Care Home Management;Neuromuscular re-education;Therapeutic  exercise;Therapeutic activities;Functional mobility training;Electrical Stimulation;Moist Heat;Traction;Iontophoresis '4mg'$ /ml Dexamethasone;Manual techniques;Passive range of motion;Dry needling;Taping    PT Next Visit Plan  Assess response to taping; reassess SIJ & address alignment as indicated; progress lumbopelvic strengthening and standing tolerance activities    Consulted and Agree with Plan of Care  Patient       Patient will benefit from skilled therapeutic intervention in order to improve the following deficits and impairments:  Pain, Impaired flexibility, Increased muscle spasms, Decreased range of motion, Decreased strength, Postural dysfunction, Improper body mechanics, Decreased activity tolerance  Visit Diagnosis: Acute left-sided low back pain with left-sided sciatica  Other symptoms and signs involving the musculoskeletal system  Muscle weakness (generalized)     Problem List Patient Active Problem List   Diagnosis Date Noted  . Chronic systolic heart failure ([place]) 05/21/2018  . Cardiomyopathy ([place])   . Hypertension 11/06/2017  . Complete rotator cuff rupture of left shoulder 09/21/2014  . Abdominal pain, epigastric 08/24/2013  . Belching 08/24/2013  . Other abnormal blood chemistry 01/21/2012  . Nonspecific (abnormal) findings on radiological and other examination of biliary tract 01/21/2012  . TIA (transient ischemic attack) 11/05/2011  . PVC's (premature ventricular contractions) 10/11/2011  . Nonspecific abnormal unspecified cardiovascular function study 12/20/2010  . Malignant neoplasm of female breast ([place]) 08/23/2008  . HEMORRHOIDS 08/23/2008  . CONSTIPATION, CHRONIC 08/23/2008  . RECTAL BLEEDING 08/23/2008  . Malignant neoplasm of colon ([place]) 01/26/2005    Percival Spanish, PT, MPT 07/02/2018, 12:39 PM  [facility] [address]  [place], [place], [address] Phone: [phone]   Fax:   [phone]  Name: Solina Heron MRN: [id number] Date of Birth: 1946-05-04

## 2018-07-06 DIAGNOSIS — M79672 Pain in left foot: Secondary | ICD-10-CM | POA: Diagnosis not present

## 2018-07-06 DIAGNOSIS — M2042 Other hammer toe(s) (acquired), left foot: Secondary | ICD-10-CM | POA: Diagnosis not present

## 2018-07-06 DIAGNOSIS — M2041 Other hammer toe(s) (acquired), right foot: Secondary | ICD-10-CM | POA: Diagnosis not present

## 2018-07-07 ENCOUNTER — Ambulatory Visit: Payer: Medicare Other | Admitting: Physical Therapy

## 2018-07-07 ENCOUNTER — Encounter: Payer: Self-pay | Admitting: Physical Therapy

## 2018-07-07 DIAGNOSIS — M6281 Muscle weakness (generalized): Secondary | ICD-10-CM | POA: Diagnosis not present

## 2018-07-07 DIAGNOSIS — M5442 Lumbago with sciatica, left side: Secondary | ICD-10-CM | POA: Diagnosis not present

## 2018-07-07 DIAGNOSIS — R29898 Other symptoms and signs involving the musculoskeletal system: Secondary | ICD-10-CM | POA: Diagnosis not present

## 2018-07-07 NOTE — Therapy (Signed)
[facility] [address]  [place], [place], [address] Phone: [phone]   Fax:  [phone]  Physical Therapy Treatment  Patient Details  Name: Eileen [name] MRN: [id number] Date of Birth: Aug 01, 1945 Referring Provider (PT): Melina Schools, MD   Encounter Date: 07/07/2018  PT End of Session - 07/07/18 0804    Visit Number  7    Number of Visits  10    Date for PT Re-Evaluation  07/13/18    Authorization Type  Medicare & BCBS    PT Start Time  0804    PT Stop Time  0846    PT Time Calculation (min)  42 min    Activity Tolerance  Patient tolerated treatment well    Behavior During Therapy  [facility] for tasks assessed/performed       Past Medical History:  Diagnosis Date  . Anemia   . Axillary mass 7/99   Left  . Barrett's esophagus   . Blood transfusion without reported diagnosis   . CARCINOMA, BREAST 1995   left  . Cervical spinal stenosis   . 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  . Headache(784.0)   . HEMORRHOIDS   . Hypertension   . Personal history of chemotherapy   . Personal history of radiation therapy     Past Surgical History:  Procedure Laterality Date  . BONE MARROW TRANSPLANT  1999  . BREAST LUMPECTOMY  11/95   left breast  . BUNIONECTOMY  3/09, 10/09   right and left  . COLON SURGERY    . COLONOSCOPY    . ERCP  01/21/2012   Procedure: ENDOSCOPIC RETROGRADE CHOLANGIOPANCREATOGRAPHY (ERCP);  Surgeon: Lafayette Dragon, MD;  Location: [facility];  Service: Endoscopy;  Laterality: [name];  . hemicolectomy  01/2005   right  . HYSTEROSCOPY  08/30/94  . INGUINAL HERNIA REPAIR    . LEFT HEART CATH AND CORONARY ANGIOGRAPHY [name] 05/21/2018   Procedure: LEFT HEART CATH AND CORONARY ANGIOGRAPHY;  Surgeon: Nelva Bush, MD;  Location: [facility];  Service: Cardiovascular;  Laterality: [name];  . NECK MASS EXCISION Left 7/99  . ROTATOR CUFF REPAIR Right 09/08/13   full tear  . TOTAL ABDOMINAL  HYSTERECTOMY W/ BILATERAL SALPINGOOPHORECTOMY  1996  . TUNNELED VENOUS CATHETER PLACEMENT  9/98    There were no vitals filed for this visit.  Subjective Assessment - 07/07/18 0807    Subjective  "Everything is the same."  Feels like taping hellped over the weekend, butnot yesterday - tape still in place today.    Diagnostic tests  03/17/18 - Lumbar x-ray: Lumbar spine scoliosis and degenerative change. No acute bony abnormalities; Negative L hip x-ray.    Patient Stated Goals  "no pain"    Currently in Pain?  No/denies    Pain Score  0-No pain   still up to 8/10 later in the day   Pain Location  Buttocks    Pain Orientation  Left    Pain Descriptors / Indicators  Aching    Pain Type  Acute pain    Pain Frequency  Intermittent         [facility] PT Assessment - 07/07/18 0804      Strength   Right Hip Flexion  4+/5    Right Hip Extension  4/5    Right Hip External Rotation   4+/5    Right Hip Internal Rotation  4+/5    Right Hip ABduction  5/5  Right Hip ADduction  5/5    Left Hip Flexion  4/5    Left Hip Extension  4/5    Left Hip External Rotation  4+/5    Left Hip Internal Rotation  4+/5    Left Hip ABduction  4+/5    Left Hip ADduction  4+/5    Right Knee Flexion  5/5    Right Knee Extension  5/5    Left Knee Flexion  5/5    Left Knee Extension  5/5                   [facility] Adult PT Treatment/Exercise - 07/07/18 0804      Exercises   Exercises  Lumbar      Lumbar Exercises: Aerobic   Recumbent Bike  L3 x 6 min      Lumbar Exercises: Standing   Wall Slides  10 reps;3 seconds   2 sets   Wall Slides Limitations  2nd set with green TB at knees for hip abd isometric    Other Standing Lumbar Exercises  B side stepping & fwd/back monster walk with red TB at ankles 2 x 63f      Lumbar Exercises: Supine   Bridge with March  5 reps   2 sets   Other Supine Lumbar Exercises  Psoas march with red TB startin from heels on peanut ball x10    Other Supine  Lumbar Exercises  Bridge + HS curl with feet on peanut ball x10               PT Short Term Goals - 06/18/18 03354     PT SHORT TERM GOAL #1   Title  Independent with initial HEP    Status  Achieved      PT SHORT TERM GOAL #2   Title  Patient to demonstrate appropriate posture and body mechanics needed for daily activities    Status  Achieved        PT Long Term Goals - 07/07/18 0845      PT LONG TERM GOAL #1   Title  Independent with ongoing/advanced HEP +/- gym program    Status  Partially Met      PT LONG TERM GOAL #2   Title  Lumbar AROM WFL w/o pain provocation    Status  On-going      PT LONG TERM GOAL #3   Title  B hip and knee strength grossly 4+/5 for improved proximal stability    Status  Partially Met      PT LONG TERM GOAL #4   Title  Patient will report ability to stand for >/= 15-20 minutes w/o increased low back or L LE pain to improve tolerance for household chores    Status  On-going      PT LONG TERM GOAL #5   Title  Patient to report ability to perform ADLs, household and recreational tasks with >/= 50% reduction in low back or L LE pain    Status  On-going            Plan - 07/07/18 0808    Clinical Impression Statement  Breana noting benefit from kinesiotaping over the weekend but less so yesterday - as it has been 5 days since application and decreasing benefit noted, recommended pt remove tape today when showering and will consider reapplication as indicated next visit. SIJ alignment remains WNL today but pt continues to pain worsening as day progresses but no pain  while sleeping or in the mornings. B LE strength improving with strength goal now partially met, however slight proximal weakness still noted on L vs R, with greatest weakness in hip B hip extension & L hip flexion therefore targeted these muscle groups during today's exercises. Given correction of SIJ alignment and strength gains but ongoing late day pain, will have pt seen  by another PT on next visit to offer another opinion and then determine need for recert vs referral back to MD next week.    Rehab Potential  Good    PT Treatment/Interventions  Patient/family education;ADLs/Self Care Home Management;Neuromuscular re-education;Therapeutic exercise;Therapeutic activities;Functional mobility training;Electrical Stimulation;Moist Heat;Traction;Iontophoresis 57m/ml Dexamethasone;Manual techniques;Passive range of motion;Dry needling;Taping    PT Next Visit Plan  Reassess SIJ & address alignment as indicated; progress lumbopelvic strengthening and standing tolerance activities; manual therapy, DN, modalities & taping as indicated    Consulted and Agree with Plan of Care  Patient       Patient will benefit from skilled therapeutic intervention in order to improve the following deficits and impairments:  Pain, Impaired flexibility, Increased muscle spasms, Decreased range of motion, Decreased strength, Postural dysfunction, Improper body mechanics, Decreased activity tolerance  Visit Diagnosis: Acute left-sided low back pain with left-sided sciatica  Other symptoms and signs involving the musculoskeletal system  Muscle weakness (generalized)     Problem List Patient Active Problem List   Diagnosis Date Noted  . Chronic systolic heart failure ([place] 05/21/2018  . Cardiomyopathy ([place]   . Hypertension 11/06/2017  . Complete rotator cuff rupture of left shoulder 09/21/2014  . Abdominal pain, epigastric 08/24/2013  . Belching 08/24/2013  . Other abnormal blood chemistry 01/21/2012  . Nonspecific (abnormal) findings on radiological and other examination of biliary tract 01/21/2012  . TIA (transient ischemic attack) 11/05/2011  . PVC's (premature ventricular contractions) 10/11/2011  . Nonspecific abnormal unspecified cardiovascular function study 12/20/2010  . Malignant neoplasm of female breast ([place] 08/23/2008  . HEMORRHOIDS 08/23/2008  . CONSTIPATION,  CHRONIC 08/23/2008  . RECTAL BLEEDING 08/23/2008  . Malignant ne[place] of colon ([place] 01/26/2005    JPercival Spanish PT, MPT 07/07/2018, 9:59 AM  [facility] [address]Phone: [phone]  Fax:  [phone] Name: EMarquitta PersichettiMRN: [id number] of Birth: 1September 16, 1947

## 2018-07-09 ENCOUNTER — Encounter: Payer: Self-pay | Admitting: Rehabilitative and Restorative Service Providers"

## 2018-07-09 ENCOUNTER — Ambulatory Visit: Payer: Medicare Other | Admitting: Rehabilitative and Restorative Service Providers"

## 2018-07-09 DIAGNOSIS — M6281 Muscle weakness (generalized): Secondary | ICD-10-CM | POA: Diagnosis not present

## 2018-07-09 DIAGNOSIS — M5442 Lumbago with sciatica, left side: Secondary | ICD-10-CM | POA: Diagnosis not present

## 2018-07-09 DIAGNOSIS — R29898 Other symptoms and signs involving the musculoskeletal system: Secondary | ICD-10-CM

## 2018-07-09 NOTE — Patient Instructions (Signed)
  Piriformis Stretch -  vary angles    Lying on back, pull right knee toward opposite shoulder. Hold 30 seconds. Repeat 3 times. Do 2-3 sessions per day.   Quads / HF, Prone KNEE: Quadriceps - Prone    Place strap around ankle. Bring ankle toward buttocks. Press hip into surface. Hold 30 seconds. Repeat 3 times per session. Do 2-3 sessions per day.   Trunk: Prone Extension (Press-Ups)    Lie on stomach on firm, flat surface. Relax bottom and legs. Raise chest in air with elbows straight. Keep hips flat on surface, sag stomach. Hold __2-3__ seconds. Repeat __5-10__ times. Do _2-3__ sessions per day. CAUTION: Movement should be gentle and slow.   Abdominal Bracing With Pelvic Floor (Hook-Lying)    With neutral spine, tighten pelvic floor and abdominals sucking belly button to back bone; tighten muscles in the low back at waist. Hold 10 sec  Repeat __10_ times. Do _several__ times a day. Progress to do this in sitting; standing; walking and with functional activities   TENS UNIT: This is helpful for muscle pain and spasm.   Search and Purchase a TENS 7000 2nd edition at www.tenspros.com. It should be less than $30.     TENS unit instructions: Do not shower or bathe with the unit on Turn the unit off before removing electrodes or batteries If the electrodes lose stickiness add a drop of water to the electrodes after they are disconnected from the unit and place on plastic sheet. If you continued to have difficulty, call the TENS unit company to purchase more electrodes. Do not apply lotion on the skin area prior to use. Make sure the skin is clean and dry as this will help prolong the life of the electrodes. After use, always check skin for unusual red areas, rash or other skin difficulties. If there are any skin problems, does not apply electrodes to the same area. Never remove the electrodes from the unit by pulling the wires. Do not use the TENS unit or electrodes other  than as directed. Do not change electrode placement without consultating your therapist or physician. Keep 2 fingers with between each electrode.

## 2018-07-09 NOTE — Therapy (Signed)
[facility] [address]  [place], [place], [address] Phone: [phone]   Fax:  [phone]  Physical Therapy Treatment  Patient Details  Name: Eileen [name] MRN: [id number] Date of Birth: 22-Dec-1945 Referring Provider (PT): Melina Schools, MD   Encounter Date: 07/09/2018  PT End of Session - 07/09/18 0902    Visit Number  8    Number of Visits  10    Date for PT Re-Evaluation  07/13/18    Authorization Type  Medicare & BCBS    PT Start Time  0845    PT Stop Time  0938    PT Time Calculation (min)  53 min    Activity Tolerance  Patient tolerated treatment well       Past Medical History:  Diagnosis Date  . Anemia   . Axillary mass 7/99   Left  . Barrett's esophagus   . Blood transfusion without reported diagnosis   . CARCINOMA, BREAST 1995   left  . Cervical spinal stenosis   . 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  . Headache(784.0)   . HEMORRHOIDS   . Hypertension   . Personal history of chemotherapy   . Personal history of radiation therapy     Past Surgical History:  Procedure Laterality Date  . BONE MARROW TRANSPLANT  1999  . BREAST LUMPECTOMY  11/95   left breast  . BUNIONECTOMY  3/09, 10/09   right and left  . COLON SURGERY    . COLONOSCOPY    . ERCP  01/21/2012   Procedure: ENDOSCOPIC RETROGRADE CHOLANGIOPANCREATOGRAPHY (ERCP);  Surgeon: Lafayette Dragon, MD;  Location: [facility];  Service: Endoscopy;  Laterality: [name];  . hemicolectomy  01/2005   right  . HYSTEROSCOPY  08/30/94  . INGUINAL HERNIA REPAIR    . LEFT HEART CATH AND CORONARY ANGIOGRAPHY [name] 05/21/2018   Procedure: LEFT HEART CATH AND CORONARY ANGIOGRAPHY;  Surgeon: Nelva Bush, MD;  Location: [facility];  Service: Cardiovascular;  Laterality: [name];  . NECK MASS EXCISION Left 7/99  . ROTATOR CUFF REPAIR Right 09/08/13   full tear  . TOTAL ABDOMINAL HYSTERECTOMY W/ BILATERAL SALPINGOOPHORECTOMY  1996  . TUNNELED  VENOUS CATHETER PLACEMENT  9/98    There were no vitals filed for this visit.  Subjective Assessment - 07/09/18 0909    Subjective  No change. Tape seems to help. Working on her exercises at home. Wondering about taking OTC antiinflammatory as a trial. Discussed possible use of TENS unit for home     Currently in Pain?  No/denies                       [facility] Adult PT Treatment/Exercise - 07/09/18 0001      Self-Care   Other Self-Care Comments   discussion of gym program - patient will continue with walking but stop lifting activities until discussion with PT Monday to review proper exercises and exercise techinque       Neuro Re-ed    Neuro Re-ed Details   core work       Lumbar Exercises: Stretches   Press Ups  10 reps   2-3 sec    Quad Stretch  Left;2 reps;30 seconds   prone with strap    Piriformis Stretch  Left;5 reps;30 seconds   supine travell varying angles      Lumbar Exercises: Aerobic   Recumbent Bike  L3 x 6 min  Lumbar Exercises: Supine   Other Supine Lumbar Exercises  4 part core - pelvic floor/transverse abd/multifidi/diaphragm 10 sec x 10       Moist Heat Therapy   Number Minutes Moist Heat  15 Minutes    Moist Heat Location  Lumbar Spine      Manual Therapy   Manual therapy comments  prone     Soft tissue mobilization  deep tissue work through the Lt piriformis     [employer];Inhibit Muscle      [employer]   [employer] Space  L SIJ: 30-50% star (3 strips)    Inhibit Muscle   L piriformis: 30% along muscle belly + 50% perpendicular strip medially at area of greatest tenderness             PT Education - 07/09/18 0930    Education Details  HEP     Person(s) Educated  Patient    Methods  Explanation;Demonstration;Tactile cues;Verbal cues;Handout    Comprehension  Verbalized understanding;Returned demonstration;Verbal cues required;Tactile cues required       PT Short Term Goals - 06/18/18 0903      PT SHORT TERM  GOAL #1   Title  Independent with initial HEP    Status  Achieved      PT SHORT TERM GOAL #2   Title  Patient to demonstrate appropriate posture and body mechanics needed for daily activities    Status  Achieved        PT Long Term Goals - 07/07/18 0845      PT LONG TERM GOAL #1   Title  Independent with ongoing/advanced HEP +/- gym program    Status  Partially Met      PT LONG TERM GOAL #2   Title  Lumbar AROM WFL w/o pain provocation    Status  On-going      PT LONG TERM GOAL #3   Title  B hip and knee strength grossly 4+/5 for improved proximal stability    Status  Partially Met      PT LONG TERM GOAL #4   Title  Patient will report ability to stand for >/= 15-20 minutes w/o increased low back or L LE pain to improve tolerance for household chores    Status  On-going      PT LONG TERM GOAL #5   Title  Patient to report ability to perform ADLs, household and recreational tasks with >/= 50% reduction in low back or L LE pain    Status  On-going            Plan - 07/09/18 0902    Clinical Impression Statement  Tenderness and pain with CPA mobs L4/5 and Lt lateral mobs; tightness to palpation through the Lt lumbar paraspinals/piriformis/glut med. Added multidirectional piriformis stretch; prone press up. Patient will hold on all lifting activities at gym - continue with aerobic exercises. She will hold on resistive exercises and add new stretches - consider TENS for home. Tape re-applied. Re-check Monday to assess response to changes.     Rehab Potential  Good    PT Treatment/Interventions  Patient/family education;ADLs/Self Care Home Management;Neuromuscular re-education;Therapeutic exercise;Therapeutic activities;Functional mobility training;Electrical Stimulation;Moist Heat;Traction;Iontophoresis '4mg'$ /ml Dexamethasone;Manual techniques;Passive range of motion;Dry needling;Taping    PT Next Visit Plan  Reassess SIJ & address alignment as indicated; progress lumbopelvic  strengthening and standing tolerance activities; manual therapy, DN, modalities & taping as indicated    Consulted and Agree with Plan of Care  Patient  Patient will benefit from skilled therapeutic intervention in order to improve the following deficits and impairments:  Pain, Impaired flexibility, Increased muscle spasms, Decreased range of motion, Decreased strength, Postural dysfunction, Improper body mechanics, Decreased activity tolerance  Visit Diagnosis: Acute left-sided low back pain with left-sided sciatica  Other symptoms and signs involving the musculoskeletal system  Muscle weakness (generalized)     Problem List Patient Active Problem List   Diagnosis Date Noted  . Chronic systolic heart failure ([place]) 05/21/2018  . Cardiomyopathy ([place])   . Hypertension 11/06/2017  . Complete rotator cuff rupture of left shoulder 09/21/2014  . Abdominal pain, epigastric 08/24/2013  . Belching 08/24/2013  . Other abnormal blood chemistry 01/21/2012  . Nonspecific (abnormal) findings on radiological and other examination of biliary tract 01/21/2012  . TIA (transient ischemic attack) 11/05/2011  . PVC's (premature ventricular contractions) 10/11/2011  . Nonspecific abnormal unspecified cardiovascular function study 12/20/2010  . Malignant neoplasm of female breast ([place]) 08/23/2008  . HEMORRHOIDS 08/23/2008  . CONSTIPATION, CHRONIC 08/23/2008  . RECTAL BLEEDING 08/23/2008  . Malignant neoplasm of colon ([place]) 01/26/2005    Grecia Lynk Nilda Simmer PT, MPH  07/09/2018, 12:50 PM  [facility] [address]  [place], [place], [address] Phone: [phone]   Fax:  [phone]  Name: Cierria Height MRN: [id number] Date of Birth: Sep 06, 1945

## 2018-07-13 ENCOUNTER — Ambulatory Visit: Payer: Medicare Other | Admitting: Physical Therapy

## 2018-07-13 DIAGNOSIS — R29898 Other symptoms and signs involving the musculoskeletal system: Secondary | ICD-10-CM

## 2018-07-13 DIAGNOSIS — M5442 Lumbago with sciatica, left side: Secondary | ICD-10-CM

## 2018-07-13 DIAGNOSIS — M6281 Muscle weakness (generalized): Secondary | ICD-10-CM | POA: Diagnosis not present

## 2018-07-13 NOTE — Therapy (Addendum)
[facility] [address]  [place], [place], [address] Phone: [phone]   Fax:  [phone]  Physical Therapy Treatment / Discharge Summary  Patient Details  Name: Eileen [name] MRN: [id number] Date of Birth: 28-Jun-1946 Referring Provider (PT): Melina Schools, MD  Progress Note  Reporting Period 06/08/18 to 07/13/18  See note below for Objective Data and Assessment of Progress/Goals.     Encounter Date: 07/13/2018  PT End of Session - 07/13/18 1400    Visit Number  9    Number of Visits  10    Date for PT Re-Evaluation  07/13/18    Authorization Type  Medicare & BCBS    PT Start Time  1400    PT Stop Time  1451    PT Time Calculation (min)  51 min    Activity Tolerance  Patient tolerated treatment well    Behavior During Therapy  WFL for tasks assessed/performed       Past Medical History:  Diagnosis Date  . Anemia   . Axillary mass 7/99   Left  . Barrett's esophagus   . Blood transfusion without reported diagnosis   . CARCINOMA, BREAST 1995   left  . Cervical spinal stenosis   . 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  . Headache(784.0)   . HEMORRHOIDS   . Hypertension   . Personal history of chemotherapy   . Personal history of radiation therapy     Past Surgical History:  Procedure Laterality Date  . BONE MARROW TRANSPLANT  1999  . BREAST LUMPECTOMY  11/95   left breast  . BUNIONECTOMY  3/09, 10/09   right and left  . COLON SURGERY    . COLONOSCOPY    . ERCP  01/21/2012   Procedure: ENDOSCOPIC RETROGRADE CHOLANGIOPANCREATOGRAPHY (ERCP);  Surgeon: Lafayette Dragon, MD;  Location: [facility];  Service: Endoscopy;  Laterality: [name];  . hemicolectomy  01/2005   right  . HYSTEROSCOPY  08/30/94  . INGUINAL HERNIA REPAIR    . LEFT HEART CATH AND CORONARY ANGIOGRAPHY [name] 05/21/2018   Procedure: LEFT HEART CATH AND CORONARY ANGIOGRAPHY;  Surgeon: Nelva Bush, MD;  Location: [facility];   Service: Cardiovascular;  Laterality: [name];  . NECK MASS EXCISION Left 7/99  . ROTATOR CUFF REPAIR Right 09/08/13   full tear  . TOTAL ABDOMINAL HYSTERECTOMY W/ BILATERAL SALPINGOOPHORECTOMY  1996  . TUNNELED VENOUS CATHETER PLACEMENT  9/98    There were no vitals filed for this visit.  Subjective Assessment - 07/13/18 1404    Subjective  Pt reports she tried taking a break from lifting at the gym as recommended last visit, but still not noting any change in her pain. Pain continues to be more of an issue later in the day and has not really noted much of a change with PT.    Limitations  Standing;Walking;House hold activities    How long can you stand comfortably?  10 minutes    How long can you walk comfortably?  no limitation with forward motion , but limited with side-stepping    Diagnostic tests  03/17/18 - Lumbar x-ray: Lumbar spine scoliosis and degenerative change. No acute bony abnormalities; Negative L hip x-ray.    Patient Stated Goals  "no pain"    Currently in Pain?  Yes    Pain Score  3     Pain Location  Buttocks    Pain Orientation  Left  Pain Descriptors / Indicators  Sore    Pain Type  Acute pain    Pain Frequency  Intermittent         [facility] PT Assessment - 07/13/18 1400      Assessment   Medical Diagnosis  L SIJ dysfunction    Referring Provider (PT)  Melina Schools, MD    Next MD Visit  07/14/18      Observation/Other Assessments   Focus on Therapeutic Outcomes (FOTO)   Lumbar spine - 62% (38% limitation)      AROM   Lumbar Flexion  WFL - hands to toes    Lumbar Extension  WFL    Lumbar - Right Side Bend  WFL - hand to lateral joint line of knee    Lumbar - Left Side Bend  WFL - hand to lateral joint line of knee    Lumbar - Right Rotation  WFL, but pain/sore in L SIJ with rotation    Lumbar - Left Rotation  [facility]      Strength   Right Hip Flexion  5/5    Right Hip Extension  4+/5    Right Hip External Rotation   5/5    Right Hip Internal Rotation   5/5    Right Hip ABduction  5/5    Right Hip ADduction  5/5    Left Hip Flexion  5/5    Left Hip Extension  4/5    Left Hip External Rotation  5/5    Left Hip Internal Rotation  5/5    Left Hip ABduction  5/5    Left Hip ADduction  5/5    Right Knee Flexion  5/5    Right Knee Extension  5/5    Left Knee Flexion  5/5    Left Knee Extension  5/5                   [facility] Adult PT Treatment/Exercise - 07/13/18 1400      Lumbar Exercises: Stretches   Press Ups  10 reps   2-3 sec    Piriformis Stretch  Left;5 reps;30 seconds   supine travell varying angles      Lumbar Exercises: Aerobic   Nustep  L5 x 6 min      Lumbar Exercises: Supine   Ab Set  10 reps;5 seconds               PT Short Term Goals - 06/18/18 0903      PT SHORT TERM GOAL #1   Title  Independent with initial HEP    Status  Achieved      PT SHORT TERM GOAL #2   Title  Patient to demonstrate appropriate posture and body mechanics needed for daily activities    Status  Achieved        PT Long Term Goals - 07/13/18 1407      PT LONG TERM GOAL #1   Title  Independent with ongoing/advanced HEP +/- gym program    Status  Achieved      PT LONG TERM GOAL #2   Title  Lumbar AROM WFL w/o pain provocation    Status  Partially Met   met for ROM, but increaesed L SIJ pain/soreness with R trunk rotation     PT LONG TERM GOAL #3   Title  B hip and knee strength grossly 4+/5 for improved proximal stability    Status  Achieved   except L hip extension 4/5  PT LONG TERM GOAL #4   Title  Patient will report ability to stand for >/= 15-20 minutes w/o increased low back or L LE pain to improve tolerance for household chores    Status  Not Met      PT LONG TERM GOAL #5   Title  Patient to report ability to perform ADLs, household and recreational tasks with >/= 50% reduction in low back or L LE pain    Status  Not Met            Plan - 07/13/18 1407    Clinical Impression Statement   Shaquanna continues to report worsening low back/buttock pain as the day progresses and has not noted much change with PT. Lumbar now [facility], but still notes pain near L SIJ with R rotation. B LE strength improved with slight weakness still noted in L hip extension as compared with remaining hip musculature. Pt feels comfortable with HEP and denies further need for review beyond most recently added exercises which were reviewed today. Home TENS unit recommended, but pt not sure if she is interested in this. Goals only partially met, but pt appears to have reached a plateau with PT without significant improvement in pain. Pt to f/u with MD tomorrow and will defer further PT pending MD assessment.    Rehab Potential  Good    PT Treatment/Interventions  Patient/family education;ADLs/Self Care Home Management;Neuromuscular re-education;Therapeutic exercise;Therapeutic activities;Functional mobility training;Electrical Stimulation;Moist Heat;Traction;Iontophoresis '4mg'$ /ml Dexamethasone;Manual techniques;Passive range of motion;Dry needling;Taping    PT Next Visit Plan  Hold for PT pending MD assessment on 07/14/18    Consulted and Agree with Plan of Care  Patient       Patient will benefit from skilled therapeutic intervention in order to improve the following deficits and impairments:  Pain, Impaired flexibility, Increased muscle spasms, Decreased range of motion, Decreased strength, Postural dysfunction, Improper body mechanics, Decreased activity tolerance  Visit Diagnosis: Acute left-sided low back pain with left-sided sciatica  Other symptoms and signs involving the musculoskeletal system  Muscle weakness (generalized)     Problem List Patient Active Problem List   Diagnosis Date Noted  . Chronic systolic heart failure ([place]) 05/21/2018  . Cardiomyopathy ([place])   . Hypertension 11/06/2017  . Complete rotator cuff rupture of left shoulder 09/21/2014  . Abdominal pain, epigastric 08/24/2013  .  Belching 08/24/2013  . Other abnormal blood chemistry 01/21/2012  . Nonspecific (abnormal) findings on radiological and other examination of biliary tract 01/21/2012  . TIA (transient ischemic attack) 11/05/2011  . PVC's (premature ventricular contractions) 10/11/2011  . Nonspecific abnormal unspecified cardiovascular function study 12/20/2010  . Malignant neoplasm of female breast ([place]) 08/23/2008  . HEMORRHOIDS 08/23/2008  . CONSTIPATION, CHRONIC 08/23/2008  . RECTAL BLEEDING 08/23/2008  . Malignant neoplasm of colon ([place]) 01/26/2005    Percival Spanish, PT, MPT 07/13/2018, 3:09 PM  [facility] [address]  [place], [place], [address] Phone: [phone]   Fax:  [phone]  Name: Dilara Navarrete MRN: [id number] Date of Birth: [birth year]  PHYSICAL THERAPY DISCHARGE SUMMARY  Visits from Start of Care: 9  Current functional level related to goals / functional outcomes:   Refer to above clinical impression for status as of last visit on 07/13/18. Pt was placed on hold for 30 days pending f/u with MD and has not returned to PT, therefore will proceed with discharge from PT for this episode.   Remaining deficits:   As above.  Education / Equipment:   HEP  Plan: Patient agrees to discharge.  Patient goals were partially met. Patient is being discharged due to lack of progress.  ?????     Percival Spanish, PT, MPT 08/13/18, 1:49 PM  [facility] [address]  [place], [place], [address] Phone: [phone]   Fax:  [phone]

## 2018-07-14 DIAGNOSIS — M9904 Segmental and somatic dysfunction of sacral region: Secondary | ICD-10-CM | POA: Diagnosis not present

## 2018-07-14 DIAGNOSIS — M259 Joint disorder, unspecified: Secondary | ICD-10-CM | POA: Diagnosis not present

## 2018-07-20 MED FILL — LOSARTAN POTASSIUM 50 MG TA: 50 | 90 days supply | Qty: 180 | Fill #3

## 2018-07-20 MED FILL — VIT D2 1.25 MG (50,000 UNIT: 1.25 MG | 84 days supply | Qty: 12 | Fill #3

## 2018-07-21 MED FILL — CARVEDILOL 6.25 MG TABLET: 6.25 | 90 days supply | Qty: 180 | Fill #1

## 2018-08-05 DIAGNOSIS — M9904 Segmental and somatic dysfunction of sacral region: Secondary | ICD-10-CM | POA: Diagnosis not present

## 2018-08-18 DIAGNOSIS — M545 Low back pain: Secondary | ICD-10-CM | POA: Diagnosis not present

## 2018-08-18 MED FILL — NABUMETONE 500 MG TABLET: 500 | 14 days supply | Qty: 28 | Fill #0

## 2018-09-15 ENCOUNTER — Other Ambulatory Visit: Payer: Self-pay | Admitting: Family Medicine

## 2018-09-15 DIAGNOSIS — Z1231 Encounter for screening mammogram for malignant neoplasm of breast: Secondary | ICD-10-CM

## 2018-09-17 DIAGNOSIS — M9905 Segmental and somatic dysfunction of pelvic region: Secondary | ICD-10-CM | POA: Diagnosis not present

## 2018-09-17 DIAGNOSIS — M5388 Other specified dorsopathies, sacral and sacrococcygeal region: Secondary | ICD-10-CM | POA: Diagnosis not present

## 2018-09-17 DIAGNOSIS — M9903 Segmental and somatic dysfunction of lumbar region: Secondary | ICD-10-CM | POA: Diagnosis not present

## 2018-09-17 DIAGNOSIS — M818 Other osteoporosis without current pathological fracture: Secondary | ICD-10-CM | POA: Diagnosis not present

## 2018-09-17 DIAGNOSIS — M9904 Segmental and somatic dysfunction of sacral region: Secondary | ICD-10-CM | POA: Diagnosis not present

## 2018-09-17 DIAGNOSIS — M5136 Other intervertebral disc degeneration, lumbar region: Secondary | ICD-10-CM | POA: Diagnosis not present

## 2018-09-21 DIAGNOSIS — M5136 Other intervertebral disc degeneration, lumbar region: Secondary | ICD-10-CM | POA: Diagnosis not present

## 2018-09-21 DIAGNOSIS — M9904 Segmental and somatic dysfunction of sacral region: Secondary | ICD-10-CM | POA: Diagnosis not present

## 2018-09-21 DIAGNOSIS — M9903 Segmental and somatic dysfunction of lumbar region: Secondary | ICD-10-CM | POA: Diagnosis not present

## 2018-09-21 DIAGNOSIS — M818 Other osteoporosis without current pathological fracture: Secondary | ICD-10-CM | POA: Diagnosis not present

## 2018-09-21 DIAGNOSIS — M9905 Segmental and somatic dysfunction of pelvic region: Secondary | ICD-10-CM | POA: Diagnosis not present

## 2018-09-21 DIAGNOSIS — M5388 Other specified dorsopathies, sacral and sacrococcygeal region: Secondary | ICD-10-CM | POA: Diagnosis not present

## 2018-09-22 DIAGNOSIS — M9904 Segmental and somatic dysfunction of sacral region: Secondary | ICD-10-CM | POA: Diagnosis not present

## 2018-09-22 DIAGNOSIS — M9905 Segmental and somatic dysfunction of pelvic region: Secondary | ICD-10-CM | POA: Diagnosis not present

## 2018-09-22 DIAGNOSIS — M9903 Segmental and somatic dysfunction of lumbar region: Secondary | ICD-10-CM | POA: Diagnosis not present

## 2018-09-22 DIAGNOSIS — M818 Other osteoporosis without current pathological fracture: Secondary | ICD-10-CM | POA: Diagnosis not present

## 2018-09-22 DIAGNOSIS — M5388 Other specified dorsopathies, sacral and sacrococcygeal region: Secondary | ICD-10-CM | POA: Diagnosis not present

## 2018-09-22 DIAGNOSIS — M5136 Other intervertebral disc degeneration, lumbar region: Secondary | ICD-10-CM | POA: Diagnosis not present

## 2018-09-24 DIAGNOSIS — M9904 Segmental and somatic dysfunction of sacral region: Secondary | ICD-10-CM | POA: Diagnosis not present

## 2018-09-24 DIAGNOSIS — M5388 Other specified dorsopathies, sacral and sacrococcygeal region: Secondary | ICD-10-CM | POA: Diagnosis not present

## 2018-09-24 DIAGNOSIS — M9903 Segmental and somatic dysfunction of lumbar region: Secondary | ICD-10-CM | POA: Diagnosis not present

## 2018-09-24 DIAGNOSIS — M9905 Segmental and somatic dysfunction of pelvic region: Secondary | ICD-10-CM | POA: Diagnosis not present

## 2018-09-24 DIAGNOSIS — M818 Other osteoporosis without current pathological fracture: Secondary | ICD-10-CM | POA: Diagnosis not present

## 2018-09-24 DIAGNOSIS — M5136 Other intervertebral disc degeneration, lumbar region: Secondary | ICD-10-CM | POA: Diagnosis not present

## 2018-09-28 DIAGNOSIS — M9904 Segmental and somatic dysfunction of sacral region: Secondary | ICD-10-CM | POA: Diagnosis not present

## 2018-09-28 DIAGNOSIS — M9905 Segmental and somatic dysfunction of pelvic region: Secondary | ICD-10-CM | POA: Diagnosis not present

## 2018-09-28 DIAGNOSIS — M818 Other osteoporosis without current pathological fracture: Secondary | ICD-10-CM | POA: Diagnosis not present

## 2018-09-28 DIAGNOSIS — M5388 Other specified dorsopathies, sacral and sacrococcygeal region: Secondary | ICD-10-CM | POA: Diagnosis not present

## 2018-09-28 DIAGNOSIS — M5136 Other intervertebral disc degeneration, lumbar region: Secondary | ICD-10-CM | POA: Diagnosis not present

## 2018-09-28 DIAGNOSIS — M9903 Segmental and somatic dysfunction of lumbar region: Secondary | ICD-10-CM | POA: Diagnosis not present

## 2018-09-28 MED FILL — CARVEDILOL 6.25 MG TABLET: 6.25 | 90 days supply | Qty: 180 | Fill #2

## 2018-09-28 MED FILL — OMEPRAZOLE 20 MG CPDR: 20 | 90 days supply | Qty: 90 | Fill #2

## 2018-09-30 DIAGNOSIS — M5388 Other specified dorsopathies, sacral and sacrococcygeal region: Secondary | ICD-10-CM | POA: Diagnosis not present

## 2018-09-30 DIAGNOSIS — M9905 Segmental and somatic dysfunction of pelvic region: Secondary | ICD-10-CM | POA: Diagnosis not present

## 2018-09-30 DIAGNOSIS — M9903 Segmental and somatic dysfunction of lumbar region: Secondary | ICD-10-CM | POA: Diagnosis not present

## 2018-09-30 DIAGNOSIS — Q72812 Congenital shortening of left lower limb: Secondary | ICD-10-CM | POA: Diagnosis not present

## 2018-10-01 DIAGNOSIS — H04123 Dry eye syndrome of bilateral lacrimal glands: Secondary | ICD-10-CM | POA: Diagnosis not present

## 2018-10-01 DIAGNOSIS — H353131 Nonexudative age-related macular degeneration, bilateral, early dry stage: Secondary | ICD-10-CM | POA: Diagnosis not present

## 2018-10-01 DIAGNOSIS — H2513 Age-related nuclear cataract, bilateral: Secondary | ICD-10-CM | POA: Diagnosis not present

## 2018-10-05 DIAGNOSIS — M5388 Other specified dorsopathies, sacral and sacrococcygeal region: Secondary | ICD-10-CM | POA: Diagnosis not present

## 2018-10-05 DIAGNOSIS — Q72812 Congenital shortening of left lower limb: Secondary | ICD-10-CM | POA: Diagnosis not present

## 2018-10-05 DIAGNOSIS — M9903 Segmental and somatic dysfunction of lumbar region: Secondary | ICD-10-CM | POA: Diagnosis not present

## 2018-10-05 DIAGNOSIS — M9905 Segmental and somatic dysfunction of pelvic region: Secondary | ICD-10-CM | POA: Diagnosis not present

## 2018-10-08 DIAGNOSIS — M9905 Segmental and somatic dysfunction of pelvic region: Secondary | ICD-10-CM | POA: Diagnosis not present

## 2018-10-08 DIAGNOSIS — Q72812 Congenital shortening of left lower limb: Secondary | ICD-10-CM | POA: Diagnosis not present

## 2018-10-08 DIAGNOSIS — M5388 Other specified dorsopathies, sacral and sacrococcygeal region: Secondary | ICD-10-CM | POA: Diagnosis not present

## 2018-10-08 DIAGNOSIS — M9903 Segmental and somatic dysfunction of lumbar region: Secondary | ICD-10-CM | POA: Diagnosis not present

## 2018-10-12 DIAGNOSIS — M9905 Segmental and somatic dysfunction of pelvic region: Secondary | ICD-10-CM | POA: Diagnosis not present

## 2018-10-12 DIAGNOSIS — Q72812 Congenital shortening of left lower limb: Secondary | ICD-10-CM | POA: Diagnosis not present

## 2018-10-12 DIAGNOSIS — M5388 Other specified dorsopathies, sacral and sacrococcygeal region: Secondary | ICD-10-CM | POA: Diagnosis not present

## 2018-10-12 DIAGNOSIS — M9903 Segmental and somatic dysfunction of lumbar region: Secondary | ICD-10-CM | POA: Diagnosis not present

## 2018-10-12 MED FILL — LOSARTAN POTASSIUM 50 MG TA: 50 | 90 days supply | Qty: 180 | Fill #0

## 2018-10-12 MED FILL — VIT D2 1.25 MG (50,000 UNIT: 1.25 MG | 84 days supply | Qty: 12 | Fill #0

## 2018-10-15 ENCOUNTER — Inpatient Hospital Stay: Payer: Medicare Other | Attending: Oncology

## 2018-10-15 DIAGNOSIS — M810 Age-related osteoporosis without current pathological fracture: Secondary | ICD-10-CM | POA: Insufficient documentation

## 2018-10-15 DIAGNOSIS — Z85038 Personal history of other malignant neoplasm of large intestine: Secondary | ICD-10-CM | POA: Insufficient documentation

## 2018-10-15 DIAGNOSIS — Z79899 Other long term (current) drug therapy: Secondary | ICD-10-CM | POA: Insufficient documentation

## 2018-10-15 DIAGNOSIS — Z853 Personal history of malignant neoplasm of breast: Secondary | ICD-10-CM | POA: Insufficient documentation

## 2018-10-15 DIAGNOSIS — R197 Diarrhea, unspecified: Secondary | ICD-10-CM | POA: Insufficient documentation

## 2018-10-20 ENCOUNTER — Ambulatory Visit: Payer: 59

## 2018-10-22 ENCOUNTER — Encounter: Payer: Self-pay | Admitting: *Deleted

## 2018-10-22 ENCOUNTER — Telehealth: Payer: Self-pay | Admitting: Oncology

## 2018-10-22 ENCOUNTER — Inpatient Hospital Stay (HOSPITAL_BASED_OUTPATIENT_CLINIC_OR_DEPARTMENT_OTHER): Payer: Medicare Other | Admitting: Oncology

## 2018-10-22 ENCOUNTER — Other Ambulatory Visit: Payer: Self-pay

## 2018-10-22 VITALS — BP 173/106 | HR 84 | Temp 98.4°F | Resp 18 | Ht 64.0 in | Wt 145.2 lb

## 2018-10-22 DIAGNOSIS — Z79899 Other long term (current) drug therapy: Secondary | ICD-10-CM

## 2018-10-22 DIAGNOSIS — R197 Diarrhea, unspecified: Secondary | ICD-10-CM

## 2018-10-22 DIAGNOSIS — M810 Age-related osteoporosis without current pathological fracture: Secondary | ICD-10-CM

## 2018-10-22 DIAGNOSIS — Z85038 Personal history of other malignant neoplasm of large intestine: Secondary | ICD-10-CM

## 2018-10-22 DIAGNOSIS — Z853 Personal history of malignant neoplasm of breast: Secondary | ICD-10-CM | POA: Diagnosis not present

## 2018-10-22 DIAGNOSIS — C189 Malignant neoplasm of colon, unspecified: Secondary | ICD-10-CM

## 2018-10-22 NOTE — Telephone Encounter (Signed)
Gave avs and calendar ° °

## 2018-10-22 NOTE — Progress Notes (Signed)
Hematology and Oncology Follow Up Visit  Eileen [name] [id number] 1945/08/05 73 y.o. 10/22/2018 10:29 AM Shirline Frees, MDHarris, [name], MD   Principle Diagnosis:  73 year old woman with:  1. 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   2.  Stage IIa colon cancer diagnosed in 2006.    Prior Therapy: 1. She is status post lumpectomy and lymph node dissection followed by [facility] and radiation therapy. 2. She is status post 4 cycles of Adriamycin and Taxotere followed by stem cell transplantation at [facility] on 08/16/1997 due to recurrence and the left supraclavicular lymph node. Her tumor is ER negative PR positive. 3. She is status post right colectomy for stage IIA adenocarcinoma of the colon in October of 2006. She is status post 10 cycles of FOLFOX concluded in 06/2005 4. Femara taken between 2001 and 2017.   Current therapy: Active surveillance.  Interim History:  Eileen [name] returns today for a repeat evaluation.  Since the last visit, she reports no major changes in her health.  She denies any recent hospitalization or illnesses.  She denies any abdominal pain or distention.  She does report loose bowel habits periodically although no diarrhea.  She continues to eat and maintain adequate nutrition.  She denies any recent falls or syncope.  Patient denied any alteration mental status, neuropathy, confusion or dizziness.  Denies any headaches or lethargy.  Denies any night sweats, weight loss or changes in appetite.  Denied orthopnea, dyspnea on exertion or chest discomfort.  Denies shortness of breath, difficulty breathing hemoptysis or cough.  Denies any abdominal distention, nausea, early satiety or dyspepsia.  Denies any hematuria, frequency, dysuria or nocturia.  Denies any skin irritation, dryness or rash.  Denies any ecchymosis or petechiae.  Denies any lymphadenopathy or clotting.  Denies any heat or cold intolerance.  Denies any  anxiety or depression.  Remaining review of system is negative.    Medications: I have reviewed the patient's current medications.  Current Outpatient Medications  Medication Sig Dispense Refill  . b complex vitamins tablet Take 1 tablet by mouth 3 (three) times a week.    . Calcium Carbonate-Vitamin D (CALCIUM-D PO) Take 1 tablet by mouth daily.    . carvedilol (COREG) 6.25 MG tablet Take 1 tablet (6.25 mg total) by mouth 2 (two) times daily. 180 tablet 3  . losartan (COZAAR) 50 MG tablet Take 50 mg by mouth 2 (two) times daily.     . Multiple Minerals (CALCIUM-MAGNESIUM-ZINC) TABS Take 1 tablet by mouth daily.    . Multiple Vitamin (MULTIVITAMIN WITH MINERALS) TABS tablet Take 1 tablet by mouth daily.    Marland Kitchen omeprazole (PRILOSEC) 20 MG capsule Take 1 capsule (20 mg total) by mouth daily. 90 capsule 3  . traZODone (DESYREL) 50 MG tablet Take 50 mg by mouth at bedtime as needed for sleep.    . vitamin B-12 (CYANOCOBALAMIN) 500 MCG tablet Take 500 mcg by mouth 4 (four) times a week. Takes on non b complex days    . vitamin C (ASCORBIC ACID) 500 MG tablet Take 500 mg by mouth daily.     . Vitamin D, Ergocalciferol, (DRISDOL) 50000 UNITS CAPS Take 50,000 Units by mouth every Wednesday.     . vitamin E 400 UNIT capsule Take 400 Units by mouth daily.     No current facility-administered medications for this visit.      Allergies:  Allergies  Allergen Reactions  . Levofloxacin Other (See Comments)  Red streaks to IV site when IV dose infusing and arm feeling very irritated, throbbing, painful  . Tape     Can use paper tape, adhesive causes redness    Past Medical History, Surgical history, Social history, and Family History reviewed and remain unchanged.   Physical Exam:  Blood pressure (!) 173/106, pulse 84, temperature 98.4 F (36.9 C), temperature source Oral, resp. rate 18, height 5\' 4"  (1.626 m), weight 145 lb 3.2 oz (65.9 kg), last menstrual period 07/30/1995, SpO2 100  %.   ECOG: 0   General appearance: Comfortable appearing without any discomfort Head: Normocephalic without any trauma Oropharynx: Mucous membranes are moist and pink without any thrush or ulcers. Eyes: Pupils are equal and round reactive to light. Lymph nodes: No cervical, supraclavicular, inguinal or axillary lymphadenopathy.   Heart:regular rate and rhythm.  S1 and S2 without leg edema. Lung: Clear without any rhonchi or wheezes.  No dullness to percussion.  Abdomin: Soft, nontender, nondistended with good bowel sounds.  No hepatosplenomegaly. Musculoskeletal: No joint deformity or effusion.  Full range of motion noted. Neurological: No deficits noted on motor, sensory and deep tendon reflex exam. Skin: No petechial rash or dryness.  Appeared moist.    Lab Results: Lab Results  Component Value Date   WBC 8.6 05/19/2018   HGB 13.3 05/19/2018   HCT 41.3 05/19/2018   MCV 87 05/19/2018   PLT 240 05/19/2018     Chemistry      Component Value Date/Time   NA 143 05/19/2018 1153   NA 142 03/12/2017 1116   K 4.9 05/19/2018 1153   K 4.2 03/12/2017 1116   CL 103 05/19/2018 1153   CL 100 03/18/2012 1422   CO2 26 05/19/2018 1153   CO2 25 03/12/2017 1116   BUN 16 05/19/2018 1153   BUN 18.8 03/12/2017 1116   CREATININE 0.87 05/19/2018 1153   CREATININE 0.81 10/21/2017 0954   CREATININE 0.8 03/12/2017 1116      Component Value Date/Time   CALCIUM 9.9 05/19/2018 1153   CALCIUM 9.7 03/12/2017 1116   ALKPHOS 63 10/21/2017 0954   ALKPHOS 71 03/12/2017 1116   AST 23 10/21/2017 0954   AST 27 03/12/2017 1116   ALT 27 10/21/2017 0954   ALT 32 03/12/2017 1116   BILITOT 0.7 10/21/2017 0954   BILITOT 0.64 03/12/2017 1116         Impression and Plan:  73 year old woman with:   1.  Colon cancer diagnosed in 2006.  She status post surgical resection of a stage IIA tumor followed by adjuvant chemotherapy.  She continues to have no evidence of recurrent disease at this time.   Her laboratory data and physical examination does not suggest relapse at this time.  Risk of relapse remains low forward.  I recommended continuing colonoscopy screening only.  Her risk of relapse was assessed and discussed today.  She remains very low risk at this time of developing recurrent disease.  She is at risk of developing other malignancy however.  She is due for colonoscopy next year.   2. Breast cancer: She continues to be disease-free without any evidence of relapse.  She remains up-to-date on her mammography without any recurrent disease.  3.  Osteoporosis: No recent pathological fractures or bone pain.  She is on Fosamax for that.  4.  Skin cancer surveillance: Recommended continuing dermatology surveillance and skin protection approach.  5. Follow-up: 1 year and sooner if needed.  15  minutes was spent with the patient  face-to-face today.  More than 50% of time was dedicated to discussing the natural course of her cancers, risk of relapse, future management options and answering questions regarding plan of care.   Zola Button, MD 3/26/202010:29 AM

## 2018-11-02 DIAGNOSIS — Z85038 Personal history of other malignant neoplasm of large intestine: Secondary | ICD-10-CM | POA: Diagnosis not present

## 2018-11-02 DIAGNOSIS — M859 Disorder of bone density and structure, unspecified: Secondary | ICD-10-CM | POA: Diagnosis not present

## 2018-11-02 DIAGNOSIS — K219 Gastro-esophageal reflux disease without esophagitis: Secondary | ICD-10-CM | POA: Diagnosis not present

## 2018-11-02 DIAGNOSIS — Z0000 Encounter for general adult medical examination without abnormal findings: Secondary | ICD-10-CM | POA: Diagnosis not present

## 2018-11-02 DIAGNOSIS — F5101 Primary insomnia: Secondary | ICD-10-CM | POA: Diagnosis not present

## 2018-11-02 DIAGNOSIS — E559 Vitamin D deficiency, unspecified: Secondary | ICD-10-CM | POA: Diagnosis not present

## 2018-11-02 DIAGNOSIS — I10 Essential (primary) hypertension: Secondary | ICD-10-CM | POA: Diagnosis not present

## 2018-11-02 DIAGNOSIS — E7800 Pure hypercholesterolemia, unspecified: Secondary | ICD-10-CM | POA: Diagnosis not present

## 2018-11-02 DIAGNOSIS — Z853 Personal history of malignant neoplasm of breast: Secondary | ICD-10-CM | POA: Diagnosis not present

## 2018-11-16 MED FILL — predniSONE 10 MG TABS: 10 | 12 days supply | Qty: 30 | Fill #0

## 2018-11-18 DIAGNOSIS — E559 Vitamin D deficiency, unspecified: Secondary | ICD-10-CM | POA: Diagnosis not present

## 2018-11-18 DIAGNOSIS — E7800 Pure hypercholesterolemia, unspecified: Secondary | ICD-10-CM | POA: Diagnosis not present

## 2018-11-18 DIAGNOSIS — I10 Essential (primary) hypertension: Secondary | ICD-10-CM | POA: Diagnosis not present

## 2018-11-19 ENCOUNTER — Ambulatory Visit: Payer: 59

## 2018-12-29 MED FILL — VIT D2 1.25 MG (50,000 UNIT: 1.25 MG | 84 days supply | Qty: 12 | Fill #0

## 2018-12-29 MED FILL — CARVEDILOL 6.25 MG TABLET: 6.25 | 90 days supply | Qty: 180 | Fill #3

## 2019-01-06 ENCOUNTER — Ambulatory Visit
Admission: RE | Admit: 2019-01-06 | Discharge: 2019-01-06 | Disposition: A | Discharge status: Home / Self Care | Payer: Medicare Other | Source: Ambulatory Visit | Attending: Family Medicine | Admitting: Family Medicine

## 2019-01-06 ENCOUNTER — Other Ambulatory Visit: Payer: Self-pay

## 2019-01-06 DIAGNOSIS — Z1231 Encounter for screening mammogram for malignant neoplasm of breast: Secondary | ICD-10-CM

## 2019-01-21 DIAGNOSIS — Z853 Personal history of malignant neoplasm of breast: Secondary | ICD-10-CM | POA: Diagnosis not present

## 2019-01-21 DIAGNOSIS — N644 Mastodynia: Secondary | ICD-10-CM | POA: Diagnosis not present

## 2019-01-22 ENCOUNTER — Other Ambulatory Visit: Payer: Self-pay | Admitting: General Surgery

## 2019-01-22 DIAGNOSIS — N6320 Unspecified lump in the left breast, unspecified quadrant: Secondary | ICD-10-CM

## 2019-01-25 ENCOUNTER — Ambulatory Visit
Admission: RE | Admit: 2019-01-25 | Discharge: 2019-01-25 | Disposition: A | Discharge status: Home / Self Care | Payer: Medicare Other | Source: Ambulatory Visit | Attending: General Surgery | Admitting: General Surgery

## 2019-01-25 ENCOUNTER — Other Ambulatory Visit: Payer: Self-pay

## 2019-01-25 DIAGNOSIS — N6320 Unspecified lump in the left breast, unspecified quadrant: Secondary | ICD-10-CM

## 2019-01-25 DIAGNOSIS — R922 Inconclusive mammogram: Secondary | ICD-10-CM | POA: Diagnosis not present

## 2019-01-25 DIAGNOSIS — N644 Mastodynia: Secondary | ICD-10-CM | POA: Diagnosis not present

## 2019-02-23 MED FILL — LOSARTAN POTASSIUM 50 MG TA: 50 | 30 days supply | Qty: 60 | Fill #0

## 2019-03-04 ENCOUNTER — Ambulatory Visit: Payer: Medicare Other | Admitting: Obstetrics & Gynecology

## 2019-03-22 MED FILL — VIT D2 1.25 MG (50,000 UNIT: 1.25 MG | 84 days supply | Qty: 12 | Fill #1

## 2019-03-23 MED FILL — LOSARTAN POTASSIUM 50 MG TA: 50 | 30 days supply | Qty: 60 | Fill #1

## 2019-03-24 DIAGNOSIS — Z23 Encounter for immunization: Secondary | ICD-10-CM | POA: Diagnosis not present

## 2019-03-30 ENCOUNTER — Other Ambulatory Visit: Payer: Self-pay

## 2019-03-31 ENCOUNTER — Ambulatory Visit (INDEPENDENT_AMBULATORY_CARE_PROVIDER_SITE_OTHER): Payer: Medicare Other | Admitting: Obstetrics & Gynecology

## 2019-03-31 ENCOUNTER — Encounter: Payer: Self-pay | Admitting: Obstetrics & Gynecology

## 2019-03-31 VITALS — BP 158/80 | HR 60 | Temp 97.8°F | Ht 64.0 in | Wt 144.0 lb

## 2019-03-31 DIAGNOSIS — R1030 Lower abdominal pain, unspecified: Secondary | ICD-10-CM | POA: Diagnosis not present

## 2019-03-31 DIAGNOSIS — R102 Pelvic and perineal pain: Secondary | ICD-10-CM | POA: Diagnosis not present

## 2019-03-31 DIAGNOSIS — Z01419 Encounter for gynecological examination (general) (routine) without abnormal findings: Secondary | ICD-10-CM

## 2019-03-31 DIAGNOSIS — Z124 Encounter for screening for malignant neoplasm of cervix: Secondary | ICD-10-CM | POA: Diagnosis not present

## 2019-03-31 LAB — POCT URINALYSIS DIPSTICK
Bilirubin, UA: NEGATIVE
Glucose, UA: NEGATIVE
Ketones, UA: NEGATIVE
Leukocytes, UA: NEGATIVE
Nitrite, UA: NEGATIVE
Protein, UA: NEGATIVE
Urobilinogen, UA: 0.2 E.U./dL
pH, UA: 5 (ref 5.0–8.0)

## 2019-03-31 NOTE — Progress Notes (Signed)
73 y.o. G3P2 Married White or Caucasian female here for annual exam.  Had 50th anniversary in April.  Had planned a trip for their anniversary but it was cancelled.   Denies vaginal bleeding.    Was hospitalized in October due to SOB.  Has echo showing cardiomyopathy.  Had cardiac catheterization.  Now followed by Dr. Stanford Breed.  Coreg has caused diarrhea for her.  Is almost daily.  She has communicated this him.    She is having a lot of pelvic pressure/abdominal pressure/pelvic pain.  Has been present for over two weeks.  Denies urinary symptoms.  Denies rectal bleeding.  Patient's last menstrual period was 07/30/1995.          Sexually active: No.  The current method of family planning is status post hysterectomy.    Exercising: Yes.    running, bike Smoker:  no  Health Maintenance: Pap:  08/08/16 neg  05/30/15 neg History of abnormal Pap:  no MMG:  01/25/19 [place] Left BIRADS1:neg. F/u 1 year  Colonoscopy:  10/11/14 normal. F/u 5 years BMD:   03/03/18 Osteopenia  TDaP:  2013 Pneumonia vaccine(s):  She is sure she's done both. Shingrix:   No Hep C testing: PCP Screening Labs: PCP  UA: RBC=trace   reports that she quit smoking about 28 years ago. She has never used smokeless tobacco. She reports current alcohol use of about 2.0 standard drinks of alcohol per week. She reports that she does not use drugs.  Past Medical History:  Diagnosis Date  . Anemia   . Axillary mass 7/99   Left  . Barrett's esophagus   . Blood transfusion without reported diagnosis   . CARCINOMA, BREAST 1995   left  . Cervical spinal stenosis   . 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  . Headache(784.0)   . HEMORRHOIDS   . Hypertension   . Personal history of chemotherapy   . Personal history of radiation therapy     Past Surgical History:  Procedure Laterality Date  . BONE MARROW TRANSPLANT  1999  . BREAST LUMPECTOMY Left 11/95   left breast  . BUNIONECTOMY  3/09, 10/09   right and left  . COLON SURGERY     . COLONOSCOPY    . ERCP  01/21/2012   Procedure: ENDOSCOPIC RETROGRADE CHOLANGIOPANCREATOGRAPHY (ERCP);  Surgeon: Lafayette Dragon, MD;  Location: [facility];  Service: Endoscopy;  Laterality: [name];  . hemicolectomy  01/2005   right  . HYSTEROSCOPY  08/30/94  . INGUINAL HERNIA REPAIR    . LEFT HEART CATH AND CORONARY ANGIOGRAPHY [name] 05/21/2018   Procedure: LEFT HEART CATH AND CORONARY ANGIOGRAPHY;  Surgeon: Nelva Bush, MD;  Location: [facility];  Service: Cardiovascular;  Laterality: [name];  . NECK MASS EXCISION Left 7/99  . ROTATOR CUFF REPAIR Right 09/08/13   full tear  . TOTAL ABDOMINAL HYSTERECTOMY W/ BILATERAL SALPINGOOPHORECTOMY  1996  . TUNNELED VENOUS CATHETER PLACEMENT  9/98    Current Outpatient Medications  Medication Sig Dispense Refill  . b complex vitamins tablet Take 1 tablet by mouth 3 (three) times a week.    . Calcium Carbonate-Vitamin D (CALCIUM-D PO) Take 1 tablet by mouth daily.    . carvedilol (COREG) 6.25 MG tablet Take 1 tablet (6.25 mg total) by mouth 2 (two) times daily. 180 tablet 3  . losartan (COZAAR) 50 MG tablet Take 50 mg by mouth 2 (two) times daily.     . Multiple Minerals (CALCIUM-MAGNESIUM-ZINC) TABS Take 1 tablet  by mouth daily.    . Multiple Vitamin (MULTIVITAMIN WITH MINERALS) TABS tablet Take 1 tablet by mouth daily.    Marland Kitchen omeprazole (PRILOSEC) 20 MG capsule Take 1 capsule (20 mg total) by mouth daily. 90 capsule 3  . vitamin B-12 (CYANOCOBALAMIN) 500 MCG tablet Take 500 mcg by mouth 4 (four) times a week. Takes on non b complex days    . vitamin C (ASCORBIC ACID) 500 MG tablet Take 500 mg by mouth daily.     . Vitamin D, Ergocalciferol, (DRISDOL) 50000 UNITS CAPS Take 50,000 Units by mouth every Wednesday.     . vitamin E 400 UNIT capsule Take 400 Units by mouth daily.     No current facility-administered medications for this visit.     Family History  Problem Relation Age of Onset  . Uterine cancer Mother   . Heart attack Father    . Colon cancer Neg Hx   . Esophageal cancer Neg Hx   . Stomach cancer Neg Hx   . Rectal cancer Neg Hx     Review of Systems  Genitourinary: Positive for pelvic pain.  All other systems reviewed and are negative.   Exam:   BP (!) 158/80   Pulse 60   Temp 97.8 F (36.6 C) (Temporal)   Ht 5\' 4"  (1.626 m)   Wt 144 lb (65.3 kg)   LMP 07/30/1995   BMI 24.72 kg/m   Height: 5\' 4"  (162.6 cm)  Ht Readings from Last 3 Encounters:  03/31/19 5\' 4"  (1.626 m)  10/22/18 5\' 4"  (1.626 m)  05/21/18 5\' 4"  (1.626 m)    General appearance: alert, cooperative and appears stated age Head: Normocephalic, without obvious abnormality, atraumatic Neck: no adenopathy, supple, symmetrical, trachea midline and thyroid normal to inspection and palpation Lungs: clear to auscultation bilaterally Breasts: normal appearance, no masses or tenderness Heart: regular rate and rhythm Abdomen: soft, non-tender; bowel sounds normal; no masses,  no organomegaly Extremities: extremities normal, atraumatic, no cyanosis or edema Skin: Skin color, texture, turgor normal. No rashes or lesions Lymph nodes: Cervical, supraclavicular, and axillary nodes normal. No abnormal inguinal nodes palpated Neurologic: Grossly normal   Pelvic: External genitalia:  no lesions              Urethra:  normal appearing urethra with no masses, tenderness or lesions              Bartholins and Skenes: normal                 Vagina: normal appearing vagina with normal color and discharge, no lesions              Cervix: absent              Pap taken: No. Bimanual Exam:  Uterus:  uterus absent              Adnexa: no mass, fullness, tenderness               Rectovaginal: Confirms               Anus:  normal sphincter tone, no lesions  Chaperone was present for exam.  A:  Well Woman with normal exam PMP, no HRT H/o TAH/BSO 1996 H/o colon cancer s/p hemicolectomy 7/06.  Colonoscopy 2016.  Every 5 years. H/O breast cancer with  recurrence 1995/1998 with clavicular mass s/p resection, s/p BMT Vit D deficiency H/o barrett's esophagus Hypertension Osteoporosis but last BMD was improved 8/19 New  onset pelvic pressure/pain/abdominal pressure  P:   Mammogram guidelines reviewed.  Done in June. pap smear not indicated today Lab work UTD Plan to repeat BMD in 2 years BMP today.  Plan to proceed with CT abdomen/pelvis. Will send urine culture just to be sure this is negative. Return annually or prn

## 2019-04-01 ENCOUNTER — Telehealth: Payer: Self-pay | Admitting: *Deleted

## 2019-04-01 DIAGNOSIS — Z85038 Personal history of other malignant neoplasm of large intestine: Secondary | ICD-10-CM

## 2019-04-01 DIAGNOSIS — R102 Pelvic and perineal pain: Secondary | ICD-10-CM

## 2019-04-01 DIAGNOSIS — R1030 Lower abdominal pain, unspecified: Secondary | ICD-10-CM

## 2019-04-01 LAB — BASIC METABOLIC PANEL
BUN/Creatinine Ratio: 18 (ref 12–28)
BUN: 15 mg/dL (ref 8–27)
CO2: 24 mmol/L (ref 20–29)
Calcium: 9.8 mg/dL (ref 8.7–10.3)
Chloride: 106 mmol/L (ref 96–106)
Creatinine, Ser: 0.85 mg/dL (ref 0.57–1.00)
GFR calc Af Amer: 80 mL/min/{1.73_m2} (ref >59)
GFR calc non Af Amer: 69 mL/min/{1.73_m2} (ref >59)
Glucose: 100 mg/dL — ABNORMAL HIGH (ref 65–99)
Potassium: 5.1 mmol/L (ref 3.5–5.2)
Sodium: 142 mmol/L (ref 134–144)

## 2019-04-01 NOTE — Telephone Encounter (Signed)
Spoke with patient, advised of appt details and instructions as seen below. Patient verbalizes understanding and is agreeable to date and time.   Routing to [employer] and [employer]  for [employer].   Cc: Dr. Sabra Heck

## 2019-04-01 NOTE — Telephone Encounter (Signed)
Spoke with Prentiss Bells at [facility], order confirmed for CT abdomen/pelvis with contrast. Scheduled for 1st available, 04/08/19 at 9am, arrive at 8:40am to register. [place] location. Patient will need to pick up oral contrast prior to day of appt. No food 4 hrs prior to appt, ok to drink fluids and take meds.

## 2019-04-01 NOTE — Telephone Encounter (Signed)
Megan Salon, MD  Burnice Logan, RN        Sharee Pimple,  Pt has been having pelvic/abdominal pain and pressure for two weeks. Exam is normal. She has a hx of colon cancer and has been having some diarrhea. I want to do a CT on her of the abdomen and pelvis. We obtained a BMP today. Can you schedule the CT? Thanks.

## 2019-04-02 LAB — URINE CULTURE: Organism ID, Bacteria: NO GROWTH

## 2019-04-08 ENCOUNTER — Other Ambulatory Visit: Payer: Self-pay

## 2019-04-08 ENCOUNTER — Ambulatory Visit
Admission: RE | Admit: 2019-04-08 | Discharge: 2019-04-08 | Disposition: A | Discharge status: Home / Self Care | Payer: Medicare Other | Source: Ambulatory Visit | Attending: Obstetrics & Gynecology | Admitting: Obstetrics & Gynecology

## 2019-04-08 DIAGNOSIS — R102 Pelvic and perineal pain: Secondary | ICD-10-CM | POA: Diagnosis not present

## 2019-04-08 DIAGNOSIS — R197 Diarrhea, unspecified: Secondary | ICD-10-CM | POA: Diagnosis not present

## 2019-04-08 DIAGNOSIS — Z85038 Personal history of other malignant neoplasm of large intestine: Secondary | ICD-10-CM

## 2019-04-08 DIAGNOSIS — R1030 Lower abdominal pain, unspecified: Secondary | ICD-10-CM

## 2019-04-08 DIAGNOSIS — R1084 Generalized abdominal pain: Secondary | ICD-10-CM | POA: Diagnosis not present

## 2019-04-08 MED ORDER — IOPAMIDOL (ISOVUE-300) INJECTION 61%
100.0000 mL | Freq: Once | INTRAVENOUS | Status: AC | PRN
  Administered 2019-04-08: 100 mL via INTRAVENOUS

## 2019-04-15 ENCOUNTER — Other Ambulatory Visit: Payer: Self-pay | Admitting: *Deleted

## 2019-04-15 DIAGNOSIS — Q8909 Congenital malformations of spleen: Secondary | ICD-10-CM

## 2019-04-23 ENCOUNTER — Telehealth: Payer: Self-pay | Admitting: *Deleted

## 2019-04-23 DIAGNOSIS — N281 Cyst of kidney, acquired: Secondary | ICD-10-CM | POA: Diagnosis not present

## 2019-04-23 DIAGNOSIS — D7389 Other diseases of spleen: Secondary | ICD-10-CM | POA: Diagnosis not present

## 2019-04-23 DIAGNOSIS — Q8909 Congenital malformations of spleen: Secondary | ICD-10-CM | POA: Diagnosis not present

## 2019-04-23 NOTE — Telephone Encounter (Signed)
Spoke with Tammy at [facility]. Patient is scheduled for MRI of abdomen on 05/07/19. Patient has contacted [facility] for earlier appt. They can see her this afternoon for appt, requesting order be faxed to [phone]. Tammy will contact patient to notify. Patient will contact [facility] to cancel 05/07/19 appt.   Order faxed to [facility].   Message to [facility] for precert.   Call returned to [place] at [phone] to confirm location of MRI for precert.   Spoke with Tammy, scheduled at [facility] location.   Routing to provider for final review. Patient is agreeable to disposition. Will close encounter.  Cc: Magdalene Patricia, [address] [employer]

## 2019-04-27 ENCOUNTER — Telehealth: Payer: Self-pay | Admitting: *Deleted

## 2019-04-27 ENCOUNTER — Other Ambulatory Visit: Payer: Self-pay | Admitting: Cardiology

## 2019-04-27 DIAGNOSIS — I429 Cardiomyopathy, unspecified: Secondary | ICD-10-CM

## 2019-04-27 MED FILL — CARVEDILOL 6.25 MG TABLET: 6.25 | 90 days supply | Qty: 180 | Fill #0

## 2019-04-27 MED FILL — LOSARTAN POTASSIUM 50 MG TA: 50 | 30 days supply | Qty: 60 | Fill #2

## 2019-04-27 NOTE — Telephone Encounter (Signed)
Spoke with patient. Reviewed 04/23/19 MRI abdomen w/wo contrast results from [facility]. Patient request copy of results be mailed to address on file. Patient request to return to [facility] for Imaging 09/2019. Advised patient I will fax order to [facility] to schedule. Patient verbalizes understating and [place] agreeable.  Copy of results mailed to patient.   Copy of results to scan.   Order to Dr. Sabra Heck for 6 mo MRI abdomen w/wo contrast for signature and fax.   Patient will stay in imaging hold.   Routing to provider for final review. Patient is agreeable to disposition. Will close encounter.

## 2019-05-04 ENCOUNTER — Encounter: Payer: Self-pay | Admitting: Gastroenterology

## 2019-05-04 ENCOUNTER — Telehealth: Payer: Self-pay | Admitting: Obstetrics & Gynecology

## 2019-05-04 ENCOUNTER — Encounter

## 2019-05-04 ENCOUNTER — Ambulatory Visit (INDEPENDENT_AMBULATORY_CARE_PROVIDER_SITE_OTHER): Payer: Medicare Other | Admitting: Gastroenterology

## 2019-05-04 VITALS — BP 162/78 | HR 68 | Temp 98.2°F | Ht 64.0 in | Wt 145.0 lb

## 2019-05-04 DIAGNOSIS — Z85038 Personal history of other malignant neoplasm of large intestine: Secondary | ICD-10-CM | POA: Diagnosis not present

## 2019-05-04 DIAGNOSIS — R1012 Left upper quadrant pain: Secondary | ICD-10-CM | POA: Diagnosis not present

## 2019-05-04 DIAGNOSIS — R1030 Lower abdominal pain, unspecified: Secondary | ICD-10-CM | POA: Diagnosis not present

## 2019-05-04 NOTE — Progress Notes (Signed)
Eileen [name]    ZC:[id number]    [birth year]  Primary Care Physician:[name], [name], MD  Referring Physician: Shirline Frees, MD [place],  [place] [address]   Chief complaint:  Diarrhea  HPI:  73 year old female with history of colon cancer status post hemicolectomy with ileocolonic anastomosis.  Last colonoscopy March 2016.  Patient complains of generalized abdominal discomfort, worse on the left side and increased pressure in the lower abdomen.  She thought it was related to her ovaries or uterus, was evaluated by her GYN.  CT abdomen pelvis was ordered April 08, 2019, showed nonspecific 8 mm lesion in spleen.  Subsequent MRI abdomen with and without contrast at [facility] April 23, 2019 showed small 8 mm T2 hyperintense lesion in spleen, does not clearly enhance on portal venous phase appears benign likely hemangioma.  Recommended follow-up CT or MRI in 6 months to ensure stability.  Complaints of change in her bowel habits with increased bowel frequency and semi-formed stool, is worse after breakfast on most days.  She has a formed bowel movement in the morning when she wakes up but after breakfast she has to go 2 or 3 additional times and occasionally it is watery diarrhea.  Denies any melena or rectal bleeding.  No relationship between abdominal pain and bowel habits.  No recent change in weight.   Outpatient Encounter Medications as of 05/04/2019  Medication Sig  . b complex vitamins tablet Take 1 tablet by mouth 3 (three) times a week.  . Calcium Carbonate-Vitamin D (CALCIUM-D PO) Take 1 tablet by mouth daily.  . carvedilol (COREG) 6.25 MG tablet Take 1 tablet (6.25 mg total) by mouth 2 (two) times daily. Please schedule an appt for further refills. 1st attempt  . losartan (COZAAR) 50 MG tablet Take 50 mg by mouth 2 (two) times daily.   . Multiple Minerals (CALCIUM-MAGNESIUM-ZINC) TABS Take 1 tablet by mouth daily.  . Multiple  Vitamin (MULTIVITAMIN WITH MINERALS) TABS tablet Take 1 tablet by mouth daily.  Marland Kitchen omeprazole (PRILOSEC) 20 MG capsule Take 1 capsule (20 mg total) by mouth daily.  . vitamin B-12 (CYANOCOBALAMIN) 500 MCG tablet Take 500 mcg by mouth 4 (four) times a week. Takes on non b complex days  . vitamin C (ASCORBIC ACID) 500 MG tablet Take 500 mg by mouth daily.   . Vitamin D, Ergocalciferol, (DRISDOL) 50000 UNITS CAPS Take 50,000 Units by mouth every Wednesday.   . vitamin E 400 UNIT capsule Take 400 Units by mouth daily.   No facility-administered encounter medications on file as of 05/04/2019.     Allergies as of 05/04/2019 - Review Complete 05/04/2019  Allergen Reaction Noted  . Levofloxacin Other (See Comments) 11/05/2011  . Tape  08/27/2013    Past Medical History:  Diagnosis Date  . Anemia   . Axillary mass 7/99   Left  . Barrett's esophagus   . Blood transfusion without reported diagnosis   . CARCINOMA, BREAST 1995   left  . Cervical spinal stenosis   . 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  . Headache(784.0)   . HEMORRHOIDS   . Hypertension   . Personal history of chemotherapy   . Personal history of radiation therapy     Past Surgical History:  Procedure Laterality Date  . BONE MARROW TRANSPLANT  1999  . BREAST LUMPECTOMY Left 11/95   left breast  .  BUNIONECTOMY  3/09, 10/09   right and left  . COLON SURGERY    . COLONOSCOPY    . ERCP  01/21/2012   Procedure: ENDOSCOPIC RETROGRADE CHOLANGIOPANCREATOGRAPHY (ERCP);  Surgeon: Lafayette Dragon, MD;  Location: [facility];  Service: Endoscopy;  Laterality: [name];  . hemicolectomy  01/2005   right  . HYSTEROSCOPY  08/30/94  . INGUINAL HERNIA REPAIR    . LEFT HEART CATH AND CORONARY ANGIOGRAPHY [name] 05/21/2018   Procedure: LEFT HEART CATH AND CORONARY ANGIOGRAPHY;  Surgeon: Nelva Bush, MD;  Location: [facility];  Service: Cardiovascular;  Laterality: [name];  . NECK MASS EXCISION Left 7/99  . ROTATOR CUFF REPAIR Right  09/08/13   full tear  . TOTAL ABDOMINAL HYSTERECTOMY W/ BILATERAL SALPINGOOPHORECTOMY  1996  . TUNNELED VENOUS CATHETER PLACEMENT  9/98    Family History  Problem Relation Age of Onset  . Uterine cancer Mother   . Heart attack Father   . Colon cancer Neg Hx   . Esophageal cancer Neg Hx   . Stomach cancer Neg Hx   . Rectal cancer Neg Hx     Social History   Socioeconomic History  . Marital status: Married    Spouse name: Not on file  . Number of children: 2  . Years of education: Not on file  . Highest education level: Not on file  Occupational History  . Not on file  Social Needs  . Financial resource strain: Not on file  . Food insecurity    Worry: Not on file    Inability: Not on file  . Transportation needs    Medical: Not on file    Non-medical: Not on file  Tobacco Use  . Smoking status: Former Smoker    Quit date: 12/20/1990    Years since quitting: 28.3  . Smokeless tobacco: Never Used  Substance and Sexual Activity  . Alcohol use: Yes    Alcohol/week: 2.0 standard drinks    Types: 2 Standard drinks or equivalent per week  . Drug use: No  . Sexual activity: Not Currently    Birth control/protection: Surgical    Comment: hysterectomy  Lifestyle  . Physical activity    Days per week: Not on file    Minutes per session: Not on file  . Stress: Not on file  Relationships  . Social Herbalist on phone: Not on file    Gets together: Not on file    Attends religious service: Not on file    Active member of club or organization: Not on file    Attends meetings of clubs or organizations: Not on file    Relationship status: Not on file  . Intimate partner violence    Fear of current or ex partner: Not on file    Emotionally abused: Not on file    Physically abused: Not on file    Forced sexual activity: Not on file  Other Topics Concern  . Not on file  Social History Narrative  . Not on file      Review of systems: Review of Systems   Constitutional: Negative for fever and chills.  HENT: Negative.   Eyes: Negative for blurred vision.  Respiratory: Negative for cough, shortness of breath and wheezing.   Cardiovascular: Negative for chest pain and palpitations.  Gastrointestinal: as per HPI Genitourinary: Negative for dysuria, urgency, frequency and hematuria.  Musculoskeletal: Negative for myalgias, back pain and joint pain.  Skin: Negative for itching and  rash.  Neurological: Negative for dizziness, tremors, focal weakness, seizures and loss of consciousness.  Endo/Heme/Allergies: Positive for seasonal allergies.  Psychiatric/Behavioral: Negative for depression, suicidal ideas and hallucinations.  All other systems reviewed and are negative.   Physical Exam: Vitals:   05/04/19 1054  BP: (!) 162/78  Pulse: 68  Temp: 98.2 F (36.8 C)   Body mass index is 24.89 kg/m. Gen:      No acute distress HEENT:  EOMI, sclera anicteric Neck:     No masses; no thyromegaly Lungs:    Clear to auscultation bilaterally; normal respiratory effort CV:         Regular rate and rhythm; no murmurs Abd:      + bowel sounds; soft, non-tender; no palpable masses, no distension Ext:    No edema; adequate peripheral perfusion Skin:      Warm and dry; no rash Neuro: alert and oriented x 3 Psych: normal mood and affect  Data Reviewed:  Reviewed labs, radiology imaging, old records and pertinent past GI work up   Assessment and Plan/Recommendations:  73 year old female with history of breast cancer, colon cancer status post hemicolectomy with ileocolonic anastomosis with complaints of left side abdominal pain and increased pressure in the lower abdomen  CT abdomen pelvis and MRI abdomen showed 8 mm hyperintense lesion in spleen, benign-appearing likely hemangioma.  No other acute pathology was noted  Will proceed with colonoscopy for evaluation of abdominal pain and surveillance of colon cancer as she is 4 and half years from the  previous colonoscopy.  Patient is worried about recurrent colon cancer.  She was getting routine colonoscopies by Dr. Olevia Perches but developed Interval colon cancer at 4 years from the prior one.  Increased bowel frequency and urgency could be secondary to lactose intolerance We will do a trial of lactose-free diet, if continues to have persistent symptoms will consider starting Colestid 1 g twice daily for possible bile salt induced diarrhea  Trial of IBgard 1 capsule up to 3 times daily as needed for excessive bloating, flatulence.  The risks and benefits as well as alternatives of endoscopic procedure(s) have been discussed and reviewed. All questions answered. The patient agrees to proceed.    Damaris Hippo , MD    CC: Shirline Frees, MD

## 2019-05-04 NOTE — Patient Instructions (Addendum)
You have been scheduled for a colonoscopy. Please follow written instructions given to you at your visit today.  Please pick up your prep supplies at the pharmacy within the next 1-3 days. If you use inhalers (even only as needed), please bring them with you on the day of your procedure.   Take OTC IBGard 1 capsule three times a day   Lactose-Free Diet, Adult If you have lactose intolerance, you are not able to digest lactose. Lactose is a natural sugar found mainly in dairy milk and dairy products. You may need to avoid all foods and beverages that contain lactose. A lactose-free diet can help you do this. Which foods have lactose? Lactose is found in dairy milk and dairy products, such as:  Yogurt.  Cheese.  Butter.  Margarine.  Sour cream.  Cream.  Whipped toppings and nondairy creamers.  Ice cream and other dairy-based desserts. Lactose is also found in foods or products made with dairy milk or milk ingredients. To find out whether a food contains dairy milk or a milk ingredient, look at the ingredients list. Avoid foods with the statement "May contain milk" and foods that contain:  Milk powder.  Whey.  Curd.  Caseinate.  Lactose.  Lactalbumin.  Lactoglobulin. What are alternatives to dairy milk and foods made with milk products?  Lactose-free milk.  Soy milk with added calcium and vitamin D.  Almond milk, coconut milk, rice milk, or other nondairy milk alternatives with added calcium and vitamin D. Note that these are low in protein.  Soy products, such as soy yogurt, soy cheese, soy ice cream, and soy-based sour cream.  Other nut milk products, such as almond yogurt, almond cheese, cashew yogurt, cashew cheese, cashew ice cream, coconut yogurt, and coconut ice cream. What are tips for following this plan?  Do not consume foods, beverages, vitamins, minerals, or medicines containing lactose. Read ingredient lists carefully.  Look for the words  "lactose-free" on labels.  Use lactase enzyme drops or tablets as directed by your health care provider.  Use lactose-free milk or a milk alternative, such as soy milk or almond milk, for drinking and cooking.  Make sure you get enough calcium and vitamin D in your diet. A lactose-free eating plan can be lacking in these important nutrients.  Take calcium and vitamin D supplements as directed by your health care provider. Talk to your health care provider about supplements if you are not able to get enough calcium and vitamin D from food. What foods can I eat?  Fruits All fresh, canned, frozen, or dried fruits that are not processed with lactose. Vegetables All fresh, frozen, and canned vegetables without cheese, cream, or butter sauces. Grains Any that are not made with dairy milk or dairy products. Meats and other proteins Any meat, fish, poultry, and other protein sources that are not made with dairy milk or dairy products. Soy cheese and yogurt. Fats and oils Any that are not made with dairy milk or dairy products. Beverages Lactose-free milk. Soy, rice, or almond milk with added calcium and vitamin D. Fruit and vegetable juices. Sweets and desserts Any that are not made with dairy milk or dairy products. Seasonings and condiments Any that are not made with dairy milk or dairy products. Calcium Calcium is found in many foods that contain lactose and is important for bone health. The amount of calcium you need depends on your age:  Adults younger than 50 years: 1,000 mg of calcium a day.  Adults older  than 50 years: 1,200 mg of calcium a day. If you are not getting enough calcium, you may get it from other sources, including:  Orange juice with calcium added. There are 300-350 mg of calcium in 1 cup of orange juice.  Calcium-fortified soy milk. There are 300-400 mg of calcium in 1 cup of calcium-fortified soy milk.  Calcium-fortified rice or almond milk. There are 300 mg of  calcium in 1 cup of calcium-fortified rice or almond milk.  Calcium-fortified breakfast cereals. There are 100-1,000 mg of calcium in calcium-fortified breakfast cereals.  Spinach, cooked. There are 145 mg of calcium in  cup of cooked spinach.  Edamame, cooked. There are 130 mg of calcium in  cup of cooked edamame.  Collard greens, cooked. There are 125 mg of calcium in  cup of cooked collard greens.  Kale, frozen or cooked. There are 90 mg of calcium in  cup of cooked or frozen kale.  Almonds. There are 95 mg of calcium in  cup of almonds.  Broccoli, cooked. There are 60 mg of calcium in 1 cup of cooked broccoli. The items listed above may not be a complete list of recommended foods and beverages. Contact a dietitian for more options. What foods are not recommended? Fruits None, unless they are made with dairy milk or dairy products. Vegetables None, unless they are made with dairy milk or dairy products. Grains Any grains that are made with dairy milk or dairy products. Meats and other proteins None, unless they are made with dairy milk or dairy products. Dairy All dairy products, including milk, goat's milk, buttermilk, kefir, acidophilus milk, flavored milk, evaporated milk, condensed milk, dulce de [place], eggnog, yogurt, cheese, and cheese spreads. Fats and oils Any that are made with milk or milk products. Margarines and salad dressings that contain milk or cheese. Cream. Half and half. Cream cheese. Sour cream. Chip dips made with sour cream or yogurt. Beverages Hot chocolate. Cocoa with lactose. Instant iced teas. Powdered fruit drinks. Smoothies made with dairy milk or yogurt. Sweets and desserts Any that are made with milk or milk products. Seasonings and condiments Chewing gum that has lactose. Spice blends if they contain lactose. Artificial sweeteners that contain lactose. Nondairy creamers. The items listed above may not be a complete list of foods and beverages  to avoid. Contact a dietitian for more information. Summary  If you are lactose intolerant, it means that you have a hard time digesting lactose, a natural sugar found in milk and milk products.  Following a lactose-free diet can help you manage this condition.  Calcium is important for bone health and is found in many foods that contain lactose. Talk with your health care provider about other sources of calcium. This information is not intended to replace advice given to you by your health care provider. Make sure you discuss any questions you have with your health care provider. Document Released: 01/04/2002 Document Revised: 08/12/2017 Document Reviewed: 08/12/2017 Elsevier Patient Education  [employer].  I appreciate the  opportunity to care for you  Thank You   Harl Bowie , MD

## 2019-05-04 NOTE — Telephone Encounter (Signed)
Patient questioned whether or not she has an MRI scheduled. Notified her that it looks like the appointment was cancelled. States she needs one in 6 months. Unsure if we need to do anything or if she needs to reach out to [place] [place].

## 2019-05-05 DIAGNOSIS — E7800 Pure hypercholesterolemia, unspecified: Secondary | ICD-10-CM | POA: Diagnosis not present

## 2019-05-05 DIAGNOSIS — G2581 Restless legs syndrome: Secondary | ICD-10-CM | POA: Diagnosis not present

## 2019-05-05 DIAGNOSIS — Z85038 Personal history of other malignant neoplasm of large intestine: Secondary | ICD-10-CM | POA: Diagnosis not present

## 2019-05-05 DIAGNOSIS — I10 Essential (primary) hypertension: Secondary | ICD-10-CM | POA: Diagnosis not present

## 2019-05-05 DIAGNOSIS — K219 Gastro-esophageal reflux disease without esophagitis: Secondary | ICD-10-CM | POA: Diagnosis not present

## 2019-05-05 DIAGNOSIS — E559 Vitamin D deficiency, unspecified: Secondary | ICD-10-CM | POA: Diagnosis not present

## 2019-05-05 DIAGNOSIS — Z853 Personal history of malignant neoplasm of breast: Secondary | ICD-10-CM | POA: Diagnosis not present

## 2019-05-06 ENCOUNTER — Telehealth: Payer: Self-pay | Admitting: Gastroenterology

## 2019-05-06 MED ORDER — NA SULFATE-K SULFATE-MG SULF 17.5-3.13-1.6 GM/177ML PO SOLN: ORAL | 0 refills | Status: DC

## 2019-05-06 MED FILL — SUPREP BOWEL PREP KIT: 17.5-3.13-1 | 1 days supply | Qty: 354 | Fill #0

## 2019-05-06 NOTE — Telephone Encounter (Signed)
Suprep sent today to pharmacy

## 2019-05-06 NOTE — Telephone Encounter (Signed)
Pt is scheduled for a colon and stated that [facility] has not received prep script.

## 2019-05-07 ENCOUNTER — Other Ambulatory Visit: Payer: Medicare Other

## 2019-05-07 NOTE — Telephone Encounter (Signed)
Sample suprep kits will be in next Thursday  I ill hold one for the patient  Patient aware

## 2019-05-07 NOTE — Telephone Encounter (Signed)
Pt reported that Suprep is over $100 after insurance and that she cannot afford the drug.

## 2019-05-11 ENCOUNTER — Telehealth: Payer: Self-pay | Admitting: *Deleted

## 2019-05-11 NOTE — Telephone Encounter (Signed)
===  View-only below this line=== ----- Message ----- From: Mauri Pole, MD Sent: 05/11/2019   9:50 AM EDT To: Osvaldo Angst, CRNA, Oda Kilts, CMA Subject: RE: [place] pt                                     Shirlean Mylar, can you please schedule her at [facility] long Endo next available appointment.  Thank you ----- Message ----- From: Osvaldo Angst, CRNA Sent: 05/11/2019   7:52 AM EDT To: Mauri Pole, MD Subject: [facility] pt                                         Dr. Silverio Decamp,  It appears this pt's most recent Echo reveals an EF of 25-30%.  Unfortunately she does not qualify for care at [facility].  Thanks,  Osvaldo Angst    Spoke with patient, cancelled her colonoscopy for [place] and will reschedule at [facility] once [place] schedule is available

## 2019-05-13 ENCOUNTER — Encounter: Payer: Medicare Other | Admitting: Gastroenterology

## 2019-05-19 ENCOUNTER — Encounter: Payer: Medicare Other | Admitting: Gastroenterology

## 2019-05-21 NOTE — Progress Notes (Signed)
HPI: Follow-up cardiomyopathy.  Patient previously seen for palpitations. Echocardiogram 10/19 showed newly reduced LV function with ejection fraction 25 to 30%, mild mitral regurgitation and biatrial enlargement. Carotid Dopplers October 2019 showed 1 to 39% bilateral stenosis. Cardiac catheterization October 2019 showed no coronary disease. Holter monitor November 2019 showed sinus rhythm with PACs, brief PAT, PVCs, occasional couplet and 3 beats nonsustained ventricular tachycardia.  Since last seen there is no dyspnea on exertion, orthopnea, PND, pedal edema, syncope.  Occasional brief 1 to 2 seconds episode of chest pain but no exertional symptoms.  Current Outpatient Medications  Medication Sig Dispense Refill  . b complex vitamins tablet Take 1 tablet by mouth 3 (three) times a week.    . Calcium Carbonate-Vitamin D (CALCIUM-D PO) Take 1 tablet by mouth daily.    . carvedilol (COREG) 6.25 MG tablet Take 1 tablet (6.25 mg total) by mouth 2 (two) times daily. Please schedule an appt for further refills. 1st attempt 180 tablet 0  . losartan (COZAAR) 50 MG tablet Take 50 mg by mouth 2 (two) times daily.     . Multiple Minerals (CALCIUM-MAGNESIUM-ZINC) TABS Take 1 tablet by mouth daily.    . Multiple Vitamin (MULTIVITAMIN WITH MINERALS) TABS tablet Take 1 tablet by mouth daily.    . Na Sulfate-K Sulfate-Mg Sulf 17.5-3.13-1.6 GM/177ML SOLN I suprep kit 354 mL 0  . omeprazole (PRILOSEC) 20 MG capsule Take 1 capsule (20 mg total) by mouth daily. 90 capsule 3  . vitamin B-12 (CYANOCOBALAMIN) 500 MCG tablet Take 500 mcg by mouth 4 (four) times a week. Takes on non b complex days    . vitamin C (ASCORBIC ACID) 500 MG tablet Take 500 mg by mouth daily.     . Vitamin D, Ergocalciferol, (DRISDOL) 50000 UNITS CAPS Take 50,000 Units by mouth every Wednesday.     . vitamin E 400 UNIT capsule Take 400 Units by mouth daily.     No current facility-administered medications for this visit.      Past  Medical History:  Diagnosis Date  . Anemia   . Axillary mass 7/99   Left  . Barrett's esophagus   . Blood transfusion without reported diagnosis   . CARCINOMA, BREAST 1995   left  . Cervical spinal stenosis   . 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  . Headache(784.0)   . HEMORRHOIDS   . Hypertension   . Personal history of chemotherapy   . Personal history of radiation therapy     Past Surgical History:  Procedure Laterality Date  . BONE MARROW TRANSPLANT  1999  . BREAST LUMPECTOMY Left 11/95   left breast  . BUNIONECTOMY  3/09, 10/09   right and left  . COLON SURGERY    . COLONOSCOPY    . ERCP  01/21/2012   Procedure: ENDOSCOPIC RETROGRADE CHOLANGIOPANCREATOGRAPHY (ERCP);  Surgeon: Lafayette Dragon, MD;  Location: [facility];  Service: Endoscopy;  Laterality: [name];  . hemicolectomy  01/2005   right  . HYSTEROSCOPY  08/30/94  . INGUINAL HERNIA REPAIR    . LEFT HEART CATH AND CORONARY ANGIOGRAPHY [name] 05/21/2018   Procedure: LEFT HEART CATH AND CORONARY ANGIOGRAPHY;  Surgeon: Nelva Bush, MD;  Location: [facility];  Service: Cardiovascular;  Laterality: [name];  . NECK MASS EXCISION Left 7/99  . ROTATOR CUFF REPAIR Right 09/08/13   full tear  . TOTAL ABDOMINAL HYSTERECTOMY W/ BILATERAL SALPINGOOPHORECTOMY  1996  . TUNNELED VENOUS CATHETER PLACEMENT  9/98    Social History   Socioeconomic History  . Marital status: Married    Spouse name: Not on file  . Number of children: 2  . Years of education: Not on file  . Highest education level: Not on file  Occupational History  . Not on file  Social Needs  . Financial resource strain: Not on file  . Food insecurity    Worry: Not on file    Inability: Not on file  . Transportation needs    Medical: Not on file    Non-medical: Not on file  Tobacco Use  . Smoking status: Former Smoker    Quit date: 12/20/1990    Years since quitting: 28.4  . Smokeless tobacco: Never Used  Substance and Sexual Activity  . Alcohol  use: Yes    Alcohol/week: 2.0 standard drinks    Types: 2 Standard drinks or equivalent per week  . Drug use: No  . Sexual activity: Not Currently    Birth control/protection: Surgical    Comment: hysterectomy  Lifestyle  . Physical activity    Days per week: Not on file    Minutes per session: Not on file  . Stress: Not on file  Relationships  . Social Herbalist on phone: Not on file    Gets together: Not on file    Attends religious service: Not on file    Active member of club or organization: Not on file    Attends meetings of clubs or organizations: Not on file    Relationship status: Not on file  . Intimate partner violence    Fear of current or ex partner: Not on file    Emotionally abused: Not on file    Physically abused: Not on file    Forced sexual activity: Not on file  Other Topics Concern  . Not on file  Social History Narrative  . Not on file    Family History  Problem Relation Age of Onset  . Uterine cancer Mother   . Heart attack Father   . Colon cancer Neg Hx   . Esophageal cancer Neg Hx   . Stomach cancer Neg Hx   . Rectal cancer Neg Hx     ROS: Diarrhea that she attributes to carvedilol but no fevers or chills, productive cough, hemoptysis, dysphasia, odynophagia, melena, hematochezia, dysuria, hematuria, rash, seizure activity, orthopnea, PND, pedal edema, claudication. Remaining systems are negative.  Physical Exam: Well-developed well-nourished in no acute distress.  Skin is warm and dry.  HEENT is normal.  Neck is supple.  Chest is clear to auscultation with normal expansion.  Cardiovascular exam is regular rate and rhythm.  Abdominal exam nontender or distended. No masses palpated. Extremities show no edema. neuro grossly intact  ECG-sinus rhythm at a rate of 67, poor R wave progression.  Personally reviewed  A/P  1 nonischemic cardiomyopathy-etiology unclear.  Cardiac catheterization revealed no coronary disease.  No  history of alcohol abuse.  Discontinue losartan.  I will arrange cardiac MRI to evaluate LV function and to exclude infiltrative cardiomyopathy.  If ejection fraction less than 35% will need to consider ICD.  She is describing diarrhea that she attributes to carvedilol.  We will discontinue and treat with Toprol 50 mg daily.  Continue losartan.  I will consider transitioning to [facility] later but she is not having CHF symptoms at present.  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  Continue  beta-blocker.  3 hypertension-blood pressure is elevated but she states typically controlled at home.  Medication adjustments as outlined above.  Follow and advance medications as needed.  Kirk Ruths, MD

## 2019-05-25 ENCOUNTER — Ambulatory Visit (INDEPENDENT_AMBULATORY_CARE_PROVIDER_SITE_OTHER): Payer: Medicare Other | Admitting: Cardiology

## 2019-05-25 ENCOUNTER — Other Ambulatory Visit: Payer: Self-pay

## 2019-05-25 ENCOUNTER — Encounter: Payer: Self-pay | Admitting: Cardiology

## 2019-05-25 VITALS — BP 146/74 | HR 67 | Temp 97.8°F | Ht 61.0 in | Wt 144.0 lb

## 2019-05-25 DIAGNOSIS — I429 Cardiomyopathy, unspecified: Secondary | ICD-10-CM

## 2019-05-25 DIAGNOSIS — I10 Essential (primary) hypertension: Secondary | ICD-10-CM

## 2019-05-25 DIAGNOSIS — I493 Ventricular premature depolarization: Secondary | ICD-10-CM | POA: Diagnosis not present

## 2019-05-25 MED ORDER — METOPROLOL SUCCINATE ER 50 MG PO TB24: 50.0000 mg | ORAL_TABLET | Freq: Every day | ORAL | 3 refills | Status: DC

## 2019-05-25 MED FILL — METOPROLOL SUCCINATE ER 50: 50 | 90 days supply | Qty: 90 | Fill #0

## 2019-05-25 NOTE — Patient Instructions (Signed)
Medication Instructions:  STOP CARVEDILOL  START METOPROLOL SUCC ER 50 MG ONCE DAILY  *If you need a refill on your cardiac medications before your next appointment, please call your pharmacy*  Lab Work: If you have labs (blood work) drawn today and your tests are completely normal, you will receive your results only by: Marland Kitchen MyChart Message (if you have MyChart) OR . A paper copy in the mail If you have any lab test that is abnormal or we need to change your treatment, we will call you to review the results.  Testing/Procedures: CARDIAC MRI FOR CARDIOMYOPATHY  Follow-Up: At [facility], you and your health needs are our priority.  As part of our continuing mission to provide you with exceptional heart care, we have created designated Provider Care Teams.  These Care Teams include your primary Cardiologist (physician) and Advanced Practice Providers (APPs -  Physician Assistants and Nurse Practitioners) who all work together to provide you with the care you need, when you need it.  Your next appointment:   3 months  The format for your next appointment:   Virtual Visit   Provider:   Kirk Ruths, MD

## 2019-05-28 ENCOUNTER — Telehealth: Payer: Self-pay | Admitting: *Deleted

## 2019-05-28 ENCOUNTER — Encounter: Payer: Self-pay | Admitting: *Deleted

## 2019-05-28 NOTE — Telephone Encounter (Signed)
Spoke with patient regarding appointment for Cardiac MRI scheduled for 06/22/19 at 12:00pm at [facility]---arrival time is 11:15 am at the 1st floor admissions office.  WIll also mail information to patient

## 2019-05-31 MED FILL — LOSARTAN POTASSIUM 50 MG TA: 50 | 90 days supply | Qty: 180 | Fill #0

## 2019-06-21 ENCOUNTER — Telehealth (HOSPITAL_COMMUNITY): Payer: Self-pay | Admitting: Emergency Medicine

## 2019-06-21 NOTE — Telephone Encounter (Signed)
Reaching out to patient to offer assistance regarding upcoming cardiac imaging study; pt verbalizes understanding of appt date/time, parking situation and where to check in, and verified current allergies; name and call back number provided for further questions should they arise Marchia Bond RN Navigator Cardiac Imaging [facility] [phone] office [phone] cell  L ARM RESTRICTION , USE R ARM FOR IV/ BP ONLY

## 2019-06-22 ENCOUNTER — Other Ambulatory Visit: Payer: Self-pay

## 2019-06-22 ENCOUNTER — Ambulatory Visit (HOSPITAL_COMMUNITY)
Admission: RE | Admit: 2019-06-22 | Discharge: 2019-06-22 | Disposition: A | Discharge status: Home / Self Care | Payer: Medicare Other | Source: Ambulatory Visit | Attending: Cardiology | Admitting: Cardiology

## 2019-06-22 DIAGNOSIS — I429 Cardiomyopathy, unspecified: Secondary | ICD-10-CM

## 2019-06-22 LAB — CREATININE, SERUM
Creatinine, Ser: 0.86 mg/dL (ref 0.44–1.00)
GFR calc Af Amer: >60 mL/min (ref >60)
GFR calc non Af Amer: >60 mL/min (ref >60)

## 2019-06-22 MED ORDER — GADOBUTROL 1 MMOL/ML IV SOLN
725.0000 mL | Freq: Once | INTRAVENOUS | Status: AC | PRN
  Administered 2019-06-22: 725 mL via INTRAVENOUS

## 2019-06-30 ENCOUNTER — Telehealth: Payer: Self-pay | Admitting: Gastroenterology

## 2019-06-30 NOTE — Telephone Encounter (Signed)
Patient is calling about colonoscopy- she states she can not have her colonoscopy at [facility] because of her heart. She is wondering if she can have the procedure without being put under and would like to discuss the colonoscopy with you.

## 2019-06-30 NOTE — Telephone Encounter (Signed)
We will still need to do the colonoscopy at [facility] given the criteria we have for [place] procedures for safety reasons.  It is safer to do the procedure at the hospital even if she prefers unsedated procedure.  Please schedule next available appointment if patient is willing to proceed.

## 2019-07-01 ENCOUNTER — Other Ambulatory Visit: Payer: Self-pay

## 2019-07-01 DIAGNOSIS — R1012 Left upper quadrant pain: Secondary | ICD-10-CM

## 2019-07-01 DIAGNOSIS — Z85038 Personal history of other malignant neoplasm of large intestine: Secondary | ICD-10-CM

## 2019-07-05 DIAGNOSIS — I5022 Chronic systolic (congestive) heart failure: Secondary | ICD-10-CM | POA: Diagnosis not present

## 2019-07-05 DIAGNOSIS — Z8719 Personal history of other diseases of the digestive system: Secondary | ICD-10-CM | POA: Diagnosis not present

## 2019-07-05 DIAGNOSIS — Z85038 Personal history of other malignant neoplasm of large intestine: Secondary | ICD-10-CM | POA: Diagnosis not present

## 2019-07-05 DIAGNOSIS — R102 Pelvic and perineal pain: Secondary | ICD-10-CM | POA: Diagnosis not present

## 2019-07-05 DIAGNOSIS — Z9484 Stem cells transplant status: Secondary | ICD-10-CM | POA: Diagnosis not present

## 2019-07-05 DIAGNOSIS — Z853 Personal history of malignant neoplasm of breast: Secondary | ICD-10-CM | POA: Diagnosis not present

## 2019-07-05 DIAGNOSIS — C4492 Squamous cell carcinoma of skin, unspecified: Secondary | ICD-10-CM | POA: Diagnosis not present

## 2019-07-05 DIAGNOSIS — I429 Cardiomyopathy, unspecified: Secondary | ICD-10-CM | POA: Diagnosis not present

## 2019-07-05 DIAGNOSIS — R161 Splenomegaly, not elsewhere classified: Secondary | ICD-10-CM | POA: Diagnosis not present

## 2019-07-06 DIAGNOSIS — L821 Other seborrheic keratosis: Secondary | ICD-10-CM | POA: Diagnosis not present

## 2019-07-06 DIAGNOSIS — Z23 Encounter for immunization: Secondary | ICD-10-CM | POA: Diagnosis not present

## 2019-07-06 DIAGNOSIS — D485 Neoplasm of uncertain behavior of skin: Secondary | ICD-10-CM | POA: Diagnosis not present

## 2019-07-06 DIAGNOSIS — L723 Sebaceous cyst: Secondary | ICD-10-CM | POA: Diagnosis not present

## 2019-07-06 DIAGNOSIS — D2261 Melanocytic nevi of right upper limb, including shoulder: Secondary | ICD-10-CM | POA: Diagnosis not present

## 2019-07-06 DIAGNOSIS — Z85828 Personal history of other malignant neoplasm of skin: Secondary | ICD-10-CM | POA: Diagnosis not present

## 2019-07-06 DIAGNOSIS — R350 Frequency of micturition: Secondary | ICD-10-CM | POA: Diagnosis not present

## 2019-07-12 MED FILL — VIT D2 1.25 MG (50,000 UNIT: 1.25 MG | 84 days supply | Qty: 12 | Fill #0

## 2019-07-15 ENCOUNTER — Other Ambulatory Visit: Payer: Self-pay

## 2019-07-15 NOTE — Telephone Encounter (Signed)
Contacted the patient with new arrival time and change of her instructions. She requests a sample of the prep. She was unable to afford the $100.00 co-pay changed by her Medicare insurance.

## 2019-07-29 DIAGNOSIS — R350 Frequency of micturition: Secondary | ICD-10-CM | POA: Diagnosis not present

## 2019-08-03 DIAGNOSIS — R350 Frequency of micturition: Secondary | ICD-10-CM | POA: Diagnosis not present

## 2019-08-03 DIAGNOSIS — R351 Nocturia: Secondary | ICD-10-CM | POA: Diagnosis not present

## 2019-08-04 ENCOUNTER — Other Ambulatory Visit: Payer: Self-pay

## 2019-08-04 DIAGNOSIS — R58 Hemorrhage, not elsewhere classified: Secondary | ICD-10-CM | POA: Diagnosis not present

## 2019-08-06 ENCOUNTER — Other Ambulatory Visit (HOSPITAL_COMMUNITY)
Admission: RE | Admit: 2019-08-06 | Discharge: 2019-08-06 | Disposition: A | Discharge status: Home / Self Care | Payer: Medicare Other | Source: Ambulatory Visit | Attending: Gastroenterology | Admitting: Gastroenterology

## 2019-08-06 DIAGNOSIS — Z01812 Encounter for preprocedural laboratory examination: Secondary | ICD-10-CM | POA: Insufficient documentation

## 2019-08-06 DIAGNOSIS — Z20822 Contact with and (suspected) exposure to covid-19: Secondary | ICD-10-CM | POA: Diagnosis not present

## 2019-08-07 LAB — NOVEL CORONAVIRUS, NAA (HOSP ORDER, SEND-OUT TO REF LAB; TAT 18-24 HRS): SARS-CoV-2, NAA: NOT DETECTED

## 2019-08-09 ENCOUNTER — Ambulatory Visit: Payer: Medicare Other | Admitting: Cardiology

## 2019-08-10 ENCOUNTER — Encounter (HOSPITAL_COMMUNITY): Payer: Self-pay | Admitting: Gastroenterology

## 2019-08-10 ENCOUNTER — Other Ambulatory Visit: Payer: Self-pay

## 2019-08-10 ENCOUNTER — Encounter (HOSPITAL_COMMUNITY)
Admission: RE | Disposition: A | Discharge status: Home / Self Care | Payer: Self-pay | Source: Home / Self Care | Attending: Gastroenterology

## 2019-08-10 ENCOUNTER — Ambulatory Visit (HOSPITAL_COMMUNITY): Payer: Medicare Other | Admitting: Anesthesiology

## 2019-08-10 ENCOUNTER — Ambulatory Visit (HOSPITAL_COMMUNITY)
Admission: RE | Admit: 2019-08-10 | Discharge: 2019-08-10 | Disposition: A | Discharge status: Home / Self Care | Payer: Medicare Other | Attending: Gastroenterology | Admitting: Gastroenterology

## 2019-08-10 DIAGNOSIS — R194 Change in bowel habit: Secondary | ICD-10-CM | POA: Diagnosis not present

## 2019-08-10 DIAGNOSIS — Z9221 Personal history of antineoplastic chemotherapy: Secondary | ICD-10-CM | POA: Insufficient documentation

## 2019-08-10 DIAGNOSIS — Z980 Intestinal bypass and anastomosis status: Secondary | ICD-10-CM | POA: Insufficient documentation

## 2019-08-10 DIAGNOSIS — K635 Polyp of colon: Secondary | ICD-10-CM | POA: Diagnosis not present

## 2019-08-10 DIAGNOSIS — Z87891 Personal history of nicotine dependence: Secondary | ICD-10-CM | POA: Diagnosis not present

## 2019-08-10 DIAGNOSIS — K648 Other hemorrhoids: Secondary | ICD-10-CM | POA: Insufficient documentation

## 2019-08-10 DIAGNOSIS — Z85038 Personal history of other malignant neoplasm of large intestine: Secondary | ICD-10-CM | POA: Diagnosis not present

## 2019-08-10 DIAGNOSIS — R197 Diarrhea, unspecified: Secondary | ICD-10-CM | POA: Diagnosis not present

## 2019-08-10 DIAGNOSIS — Z9049 Acquired absence of other specified parts of digestive tract: Secondary | ICD-10-CM | POA: Insufficient documentation

## 2019-08-10 DIAGNOSIS — Z6824 Body mass index (BMI) 24.0-24.9, adult: Secondary | ICD-10-CM | POA: Insufficient documentation

## 2019-08-10 DIAGNOSIS — Z853 Personal history of malignant neoplasm of breast: Secondary | ICD-10-CM | POA: Diagnosis not present

## 2019-08-10 DIAGNOSIS — I110 Hypertensive heart disease with heart failure: Secondary | ICD-10-CM | POA: Diagnosis not present

## 2019-08-10 DIAGNOSIS — R1012 Left upper quadrant pain: Secondary | ICD-10-CM

## 2019-08-10 DIAGNOSIS — Z86010 Personal history of colonic polyps: Secondary | ICD-10-CM | POA: Diagnosis not present

## 2019-08-10 DIAGNOSIS — I509 Heart failure, unspecified: Secondary | ICD-10-CM | POA: Insufficient documentation

## 2019-08-10 DIAGNOSIS — R109 Unspecified abdominal pain: Secondary | ICD-10-CM | POA: Insufficient documentation

## 2019-08-10 DIAGNOSIS — Z923 Personal history of irradiation: Secondary | ICD-10-CM | POA: Diagnosis not present

## 2019-08-10 DIAGNOSIS — R634 Abnormal weight loss: Secondary | ICD-10-CM | POA: Diagnosis not present

## 2019-08-10 HISTORY — PX: COLONOSCOPY WITH PROPOFOL: SHX5780

## 2019-08-10 HISTORY — PX: BIOPSY: SHX5522

## 2019-08-10 SURGERY — COLONOSCOPY WITH PROPOFOL
Anesthesia: Monitor Anesthesia Care

## 2019-08-10 MED ORDER — PROPOFOL 500 MG/50ML IV EMUL
INTRAVENOUS | Status: AC
  Filled 2019-08-10: qty 50

## 2019-08-10 MED ORDER — LOSARTAN POTASSIUM 50 MG PO TABS
50.0000 mg | ORAL_TABLET | ORAL | Status: AC
  Administered 2019-08-10: 50 mg via ORAL
  Filled 2019-08-10: qty 1

## 2019-08-10 MED ORDER — SODIUM CHLORIDE 0.9 % IV SOLN: INTRAVENOUS | Status: DC

## 2019-08-10 MED ORDER — PROPOFOL 500 MG/50ML IV EMUL
INTRAVENOUS | Status: DC | PRN
  Administered 2019-08-10: 100 ug/kg/min via INTRAVENOUS

## 2019-08-10 MED ORDER — ESMOLOL HCL 100 MG/10ML IV SOLN
INTRAVENOUS | Status: DC | PRN
  Administered 2019-08-10: 20 ug via INTRAVENOUS

## 2019-08-10 MED ORDER — LACTATED RINGERS IV SOLN: INTRAVENOUS | Status: DC

## 2019-08-10 MED ORDER — COLESTIPOL HCL 1 G PO TABS: ORAL_TABLET | ORAL | 3 refills | Status: DC

## 2019-08-10 MED ORDER — PROPOFOL 10 MG/ML IV BOLUS
INTRAVENOUS | Status: DC | PRN
  Administered 2019-08-10: 10 ug via INTRAVENOUS
  Administered 2019-08-10: 20 ug via INTRAVENOUS

## 2019-08-10 MED ORDER — LIDOCAINE HCL 1 % IJ SOLN
INTRAMUSCULAR | Status: DC | PRN
  Administered 2019-08-10: 50 mg via INTRADERMAL
  Administered 2019-08-10: 40 mg via INTRADERMAL

## 2019-08-10 SURGICAL SUPPLY — 21 items

## 2019-08-10 NOTE — Transfer of Care (Signed)
Immediate Anesthesia Transfer of Care Note  Patient: Eileen [name]  Procedure(s) Performed: COLONOSCOPY WITH PROPOFOL ([name] ) BIOPSY  Patient Location: PACU and Endoscopy Unit  Anesthesia Type:MAC  Level of Consciousness: awake, drowsy and patient cooperative  Airway & Oxygen Therapy: Patient Spontanous Breathing and Patient connected to face mask oxygen  Post-op Assessment: Report given to RN and Post -op Vital signs reviewed and stable  Post vital signs: Reviewed and stable  Last Vitals:  Vitals Value Taken Time  BP    Temp    Pulse 39 08/10/19 0952  Resp 18 08/10/19 0952  SpO2 100 % 08/10/19 0952  Vitals shown include unvalidated device data.  Last Pain:  Vitals:   08/10/19 0855  TempSrc: Oral  PainSc: 0-No pain         Complications: No apparent anesthesia complications

## 2019-08-10 NOTE — Anesthesia Preprocedure Evaluation (Addendum)
Anesthesia Evaluation  Patient identified by MRN, date of birth, ID band Patient awake    Reviewed: Allergy & Precautions, H&P , NPO status , Patient's Chart, lab work & pertinent test results, reviewed documented beta blocker date and time   History of Anesthesia Complications Negative for: history of anesthetic complications  Airway Mallampati: II  TM Distance: >3 FB Neck ROM: Full    Dental  (+) Teeth Intact, Dental Advisory Given   Pulmonary former smoker,  Quit smoking 1992   Pulmonary exam normal breath sounds clear to auscultation       Cardiovascular hypertension, Pt. on medications and Pt. on home beta blockers +CHF  Normal cardiovascular exam+ dysrhythmias  Rhythm:Regular Rate:Normal  Cath 04/2018: 1. No angiographically significant coronary artery disease. 2. Mildly elevated left ventricular filling pressure.   Echo 04/2018: - Left ventricle: The cavity size was normal. Wall thickness was increased in a pattern of mild LVH. Systolic function was  severely reduced. The estimated ejection fraction was in the range of 25% to 30%. Diffuse hypokinesis. Doppler parameters are consistent with abnormal left ventricular relaxation (grade 1 diastolic dysfunction). - Mitral valve: There was mild regurgitation. - Left atrium: The atrium was mildly to moderately dilated. - Right atrium: The atrium was mildly dilated.   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 No known etiology- no leg swelling, currently not on diuretics. Recently was sent for cardiac MRI to evaluate for infiltrative processes in November 2020, EF actually improved to 43%.   Prior EKGs with occasional PVCs and dropped beats   Neuro/Psych  Headaches, TIAnegative psych ROS   GI/Hepatic Neg liver ROS, Hx Colon Cancer barretts esophagus   Endo/Other  negative endocrine ROS  Renal/GU negative Renal ROS  negative genitourinary    Musculoskeletal negative musculoskeletal ROS (+)   Abdominal Normal abdominal exam  (+)   Peds negative pediatric ROS (+)  Hematology  (+) anemia ,   Anesthesia Other Findings Restricted extremity L arm s/p axillary lymph node dissection  Reproductive/Obstetrics negative OB ROS                           Anesthesia Physical  Anesthesia Plan  ASA: III  Anesthesia Plan: MAC   Post-op Pain Management:    Induction:   PONV Risk Score and Plan: 2 and Propofol infusion and TIVA  Airway Management Planned: Natural Airway and Simple Face Mask  Additional Equipment: None  Intra-op Plan:   Post-operative Plan:   Informed Consent: I have reviewed the patients History and Physical, chart, labs and discussed the procedure including the risks, benefits and alternatives for the proposed anesthesia with the patient or authorized representative who has indicated his/her understanding and acceptance.       Plan Discussed with: CRNA  Anesthesia Plan Comments:         Anesthesia Quick Evaluation

## 2019-08-10 NOTE — Addendum Note (Signed)
Addendum  created 08/10/19 1209 by Pervis Hocking, DO   Clinical Note Signed

## 2019-08-10 NOTE — Progress Notes (Signed)
Patient blood pressure elevated over 200 post op.  Ordered patients 50 mg po losartan to give prior to discharge per anesthesia md.  Patient verbalized understanding to check b/p and home and to take evening dose of medication also.

## 2019-08-10 NOTE — Anesthesia Postprocedure Evaluation (Addendum)
Anesthesia Post Note  Patient: Building services engineer  Procedure(s) Performed: COLONOSCOPY WITH PROPOFOL ([name] ) BIOPSY     Patient location during evaluation: PACU Anesthesia Type: MAC Level of consciousness: awake and alert Pain management: pain level controlled Vital Signs Assessment: post-procedure vital signs reviewed and stable Respiratory status: spontaneous breathing, nonlabored ventilation and respiratory function stable Cardiovascular status: blood pressure returned to baseline and stable Postop Assessment: no apparent nausea or vomiting Anesthetic complications: no Comments: Frequent PVCs and dropped beats in PACU, improving with IV hydration- however BP has gradually increased to 579 systolic. Has been hypertensive in recent cardiology visits as well- antihypertensive medications are currently being titrated. Will give home dose of losartan which was missed this morning with instructions to retake blood pressure tonight and tomorrow and call cardiologist if no improvement.    Last Vitals:  Vitals:   08/10/19 1010 08/10/19 1020  BP: (!) 161/85 (!) 167/66  Pulse: (!) 40 61  Resp: 17 17  Temp:    SpO2: 100% 100%    Last Pain:  Vitals:   08/10/19 1020  TempSrc:   PainSc: 0-No pain                 Pervis Hocking

## 2019-08-10 NOTE — Op Note (Signed)
[facility] Patient Name: Eileen [name] Procedure Date: 08/10/2019 MRN: [id number] Attending MD: Mauri Pole , MD Date of Birth: 09-05-1945 CSN: [id number] Age: 74 Admit Type: Outpatient Procedure:                Colonoscopy Indications:              High risk colon cancer surveillance: Personal                            history of colon cancer Providers:                Mauri Pole, MD, Cleda Daub, RN, Elspeth Cho Tech., Technician, Corie Chiquito,                            Technician, Karis Juba, CRNA Referring MD:              Medicines:                Monitored Anesthesia Care Complications:            No immediate complications. Estimated Blood Loss:     Estimated blood loss was minimal. Procedure:                Pre-Anesthesia Assessment:                           - Prior to the procedure, a History and Physical                            was performed, and patient medications and                            allergies were reviewed. The patient's tolerance of                            previous anesthesia was also reviewed. The risks                            and benefits of the procedure and the sedation                            options and risks were discussed with the patient.                            All questions were answered, and informed consent                            was obtained. Prior Anticoagulants: The patient has                            taken no previous anticoagulant or antiplatelet                            agents.  ASA Grade Assessment: III - A patient with                            severe systemic disease. After reviewing the risks                            and benefits, the patient was deemed in                            satisfactory condition to undergo the procedure.                           After obtaining informed consent, the colonoscope                            was  passed under direct vision. Throughout the                            procedure, the patient's blood pressure, pulse, and                            oxygen saturations were monitored continuously. The                            [id number]) Olympus pediatric colonscope                            was introduced through the anus and advanced to the                            the ileocolonic anastomosis. The colonoscopy was                            performed without difficulty. The patient tolerated                            the procedure well. The quality of the bowel                            preparation was excellent. The ileocolonic                            anastomosis, distal ileum and the rectum were                            photographed. Scope In: 9:29:52 AM Scope Out: 9:44:27 AM Scope Withdrawal Time: 0 hours 7 minutes 45 seconds  Total Procedure Duration: 0 hours 14 minutes 35 seconds  Findings:      The perianal and digital rectal examinations were normal.      A less than 1 mm polyp was found in the descending colon. The polyp was       sessile. The polyp was removed with a cold biopsy forceps. Resection and       retrieval were complete.  Normal mucosa was found in the left colon. Biopsies for histology were       taken with a cold forceps from the left colon and transverse colon for       evaluation of microscopic colitis.      There was evidence of a prior end-to-side ileo-colonic anastomosis in       the transverse colon. This was patent and was characterized by healthy       appearing mucosa. The anastomosis was traversed.      Non-bleeding internal hemorrhoids were found during retroflexion. The       hemorrhoids were small.      The exam was otherwise without abnormality. Impression:               - One less than 1 mm polyp in the descending colon,                            removed with a cold biopsy forceps. Resected and                             retrieved.                           - Normal mucosa in the left colon. Biopsied.                           - Patent end-to-side ileo-colonic anastomosis,                            characterized by healthy appearing mucosa.                           - Non-bleeding internal hemorrhoids.                           - The examination was otherwise normal. Moderate Sedation:      Not Applicable - Patient had care per Anesthesia. Recommendation:           - Patient has a contact number available for                            emergencies. The signs and symptoms of potential                            delayed complications were discussed with the                            patient. Return to normal activities tomorrow.                            Written discharge instructions were provided to the                            patient.                           - Resume previous diet.                           -  Continue present medications.                           - Await pathology results.                           - Repeat colonoscopy in 5 years for surveillance                            based on pathology results.                           - Return to GI clinic in 3 months.                           - Use Benefiber one teaspoon PO TID.                           - Colestid 1gm with lunch daily Procedure Code(s):        --- Professional ---                           [phone], Colonoscopy, flexible; with biopsy, single                            or multiple Diagnosis Code(s):        --- Professional ---                           [id number], Personal history of other malignant                            neoplasm of large intestine                           K63.5, Polyp of colon                           K64.8, Other hemorrhoids                           Z98.0, Intestinal bypass and anastomosis status CPT copyright 2019 American Medical Association. All rights reserved. The codes documented in this  report are preliminary and upon coder review may  be revised to meet current compliance requirements. Mauri Pole, MD 08/10/2019 9:55:14 AM This report has been signed electronically. Number of Addenda: 0

## 2019-08-10 NOTE — H&P (Signed)
[facility] History and Physical   Primary Care Physician:  Shirline Frees, MD   Reason for Procedure:   Diarrhea, weight loss, abdominal pain, colon cancer surveillance Plan:    Colonoscopy with biopsies and possible intervention     HPI: Eileen [name] is a 74 y.o. female with h/o colon ca s/o hemi colectomy here for colonoscopy for evaluation of change in bowel habits, abdominal pain, weight loss and diarrhea.    Past Medical History:  Diagnosis Date  . Anemia   . Axillary mass 7/99   Left  . Barrett's esophagus   . Blood transfusion without reported diagnosis   . CARCINOMA, BREAST 1995   left  . Cervical spinal stenosis   . 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  . Headache(784.0)   . HEMORRHOIDS   . Hypertension   . Personal history of chemotherapy   . Personal history of radiation therapy     Past Surgical History:  Procedure Laterality Date  . BONE MARROW TRANSPLANT  1999  . BREAST LUMPECTOMY Left 11/95   left breast  . BUNIONECTOMY  3/09, 10/09   right and left  . COLON SURGERY    . COLONOSCOPY    . ERCP  01/21/2012   Procedure: ENDOSCOPIC RETROGRADE CHOLANGIOPANCREATOGRAPHY (ERCP);  Surgeon: Lafayette Dragon, MD;  Location: [facility];  Service: Endoscopy;  Laterality: [name];  . hemicolectomy  01/2005   right  . HYSTEROSCOPY  08/30/94  . INGUINAL HERNIA REPAIR    . LEFT HEART CATH AND CORONARY ANGIOGRAPHY [name] 05/21/2018   Procedure: LEFT HEART CATH AND CORONARY ANGIOGRAPHY;  Surgeon: Nelva Bush, MD;  Location: [facility];  Service: Cardiovascular;  Laterality: [name];  . NECK MASS EXCISION Left 7/99  . ROTATOR CUFF REPAIR Right 09/08/13   full tear  . TOTAL ABDOMINAL HYSTERECTOMY W/ BILATERAL SALPINGOOPHORECTOMY  1996  . TUNNELED VENOUS CATHETER PLACEMENT  9/98    Prior to Admission medications   Medication Sig Start Date End Date Taking? Authorizing Provider  b complex vitamins tablet Take 1 tablet by mouth 2 (two) times a week.    Yes  [provider]  Calcium Carb-Cholecalciferol (CALCIUM 500+D PO) Take 1 tablet by mouth daily.   Yes [provider]  losartan (COZAAR) 50 MG tablet Take 50 mg by mouth 2 (two) times daily.    Yes [provider]  metoprolol succinate (TOPROL-XL) 50 MG 24 hr tablet Take 1 tablet (50 mg total) by mouth daily. 05/25/19  Yes Lelon Perla, MD  Multiple Minerals (CALCIUM-MAGNESIUM-ZINC) TABS Take 1 tablet by mouth 2 (two) times a week.    Yes [provider]  Multiple Vitamin (MULTIVITAMIN WITH MINERALS) TABS tablet Take 1 tablet by mouth daily.   Yes [provider]  Na Sulfate-K Sulfate-Mg Sulf 17.5-3.13-1.6 GM/177ML SOLN I suprep kit 05/06/19  Yes Nandigam, Venia Minks, MD  omeprazole (PRILOSEC) 20 MG capsule Take 1 capsule (20 mg total) by mouth daily. Patient taking differently: Take 20 mg by mouth daily as needed (acid reflux).  08/05/17  Yes Nandigam, Venia Minks, MD  vitamin B-12 (CYANOCOBALAMIN) 500 MCG tablet Take 500 mcg by mouth See admin instructions. Take 500 mcg 5 days weekly, takes on non b complex days   Yes [provider]  vitamin C (ASCORBIC ACID) 500 MG tablet Take 500 mg by mouth 2 (two) times daily.    Yes [provider]  Vitamin D, Ergocalciferol, (DRISDOL) 50000 UNITS CAPS Take 50,000 Units by mouth  every Wednesday.  08/28/11  Yes [provider]  vitamin E 400 UNIT capsule Take 400 Units by mouth daily.   Yes [provider]  acetaminophen (TYLENOL) 500 MG tablet Take 500 mg by mouth every 6 (six) hours as needed for moderate pain or headache.    [provider]    Current Facility-Administered Medications  Medication Dose Route Frequency Provider Last Rate Last Admin  . 0.9 %  sodium chloride infusion   Intravenous Continuous Mauri Pole, MD        Allergies as of 07/01/2019 - Review Complete 05/25/2019  Allergen Reaction Noted  . Levofloxacin Other (See Comments) 11/05/2011   . Tape  08/27/2013    Family History  Problem Relation Age of Onset  . Uterine cancer Mother   . Heart attack Father   . Colon cancer Neg Hx   . Esophageal cancer Neg Hx   . Stomach cancer Neg Hx   . Rectal cancer Neg Hx     Social History   Socioeconomic History  . Marital status: Married    Spouse name: Not on file  . Number of children: 2  . Years of education: Not on file  . Highest education level: Not on file  Occupational History  . Not on file  Tobacco Use  . Smoking status: Former Smoker    Quit date: 12/20/1990    Years since quitting: 28.6  . Smokeless tobacco: Never Used  Substance and Sexual Activity  . Alcohol use: Yes    Alcohol/week: 2.0 standard drinks    Types: 2 Standard drinks or equivalent per week  . Drug use: No  . Sexual activity: Not Currently    Birth control/protection: Surgical    Comment: hysterectomy  Other Topics Concern  . Not on file  Social History Narrative  . Not on file   Social Determinants of Health   Financial Resource Strain:   . Difficulty of Paying Living Expenses: Not on file  Food Insecurity:   . Worried About Charity fundraiser in the Last Year: Not on file  . Ran Out of Food in the Last Year: Not on file  Transportation Needs:   . Lack of Transportation (Medical): Not on file  . Lack of Transportation (Non-Medical): Not on file  Physical Activity:   . Days of Exercise per Week: Not on file  . Minutes of Exercise per Session: Not on file  Stress:   . Feeling of Stress : Not on file  Social Connections:   . Frequency of Communication with Friends and Family: Not on file  . Frequency of Social Gatherings with Friends and Family: Not on file  . Attends Religious Services: Not on file  . Active Member of Clubs or Organizations: Not on file  . Attends Archivist Meetings: Not on file  . Marital Status: Not on file  Intimate Partner Violence:   . Fear of Current or Ex-Partner: Not on file  .  Emotionally Abused: Not on file  . Physically Abused: Not on file  . Sexually Abused: Not on file    Review of Systems:  All other review of systems negative except as mentioned in the HPI.  Physical Exam: Vital signs in last 24 hours: Temp:  [97.9 F (36.6 C)] 97.9 F (36.6 C) (01/12 0855) Pulse Rate:  [84] 84 (01/12 0855) Resp:  [22] 22 (01/12 0855) BP: (159)/(93) 159/93 (01/12 0855) SpO2:  [100 %] 100 % (01/12 0855)  General:   Alert,  Well-developed, well-nourished, pleasant and cooperative in NAD Lungs:  Clear throughout to auscultation.   Heart:  Regular rate and rhythm; no murmurs, clicks, rubs,  or gallops. Abdomen:  Soft, nontender and nondistended. Normal bowel sounds.   Neuro/Psych:  Alert and cooperative. Normal mood and affect. A and O x 3   K. Denzil Magnuson , MD [phone]

## 2019-08-10 NOTE — Discharge Instructions (Signed)

## 2019-08-11 ENCOUNTER — Encounter: Payer: Self-pay | Admitting: *Deleted

## 2019-08-11 LAB — SURGICAL PATHOLOGY

## 2019-08-11 MED FILL — COLESTIPOL HCL 1 GM TABLET: 1 | 30 days supply | Qty: 30 | Fill #0

## 2019-08-13 ENCOUNTER — Other Ambulatory Visit: Payer: Self-pay | Admitting: Cardiology

## 2019-08-13 DIAGNOSIS — I429 Cardiomyopathy, unspecified: Secondary | ICD-10-CM

## 2019-08-13 MED ORDER — METOPROLOL SUCCINATE ER 50 MG PO TB24: 50.0000 mg | ORAL_TABLET | Freq: Every day | ORAL | 0 refills | Status: DC

## 2019-08-13 MED FILL — METOPROLOL SUCCINATE ER 50: 50 | 30 days supply | Qty: 30 | Fill #0

## 2019-08-13 NOTE — Telephone Encounter (Signed)
*  STAT* If patient is at the pharmacy, call can be transferred to refill team.   1. Which medications need to be refilled? (please list name of each medication and dose if known) metoprolol succinate (TOPROL-XL) 50 MG 24 hr tablet  2. Which pharmacy/location (including street and city if local pharmacy) is medication to be sent to? [id number] - [place], [place] - [address]  3. Do they need a 30 day or 90 day supply? 90 day     

## 2019-08-17 ENCOUNTER — Encounter: Payer: Self-pay | Admitting: Gastroenterology

## 2019-08-17 DIAGNOSIS — R58 Hemorrhage, not elsewhere classified: Secondary | ICD-10-CM | POA: Diagnosis not present

## 2019-08-18 DIAGNOSIS — I8312 Varicose veins of left lower extremity with inflammation: Secondary | ICD-10-CM | POA: Diagnosis not present

## 2019-08-23 NOTE — Progress Notes (Signed)
Virtual Visit via Video Note   This visit type was conducted due to national recommendations for restrictions regarding the COVID-19 Pandemic (e.g. social distancing) in an effort to limit this patient's exposure and mitigate transmission in our community.  Due to her co-morbid illnesses, this patient is at least at moderate risk for complications without adequate follow up.  This format is felt to be most appropriate for this patient at this time.  All issues noted in this document were discussed and addressed.  A limited physical exam was performed with this format.  Please refer to the patient's chart for her consent to telehealth for [facility].   Date:  08/26/2019   ID:  Eileen [name], DOB [birth year], MRN [id number]  Patient Location:Home Provider Location: Home  PCP:  Shirline Frees, MD  Cardiologist:  Dr Stanford Breed  Evaluation Performed:  Follow-Up Visit  Chief Complaint:  FU CM  History of Present Illness:    Follow-up cardiomyopathy.Patient previously seen for palpitations. Echocardiogram 10/19showed newly reduced LV function with ejection fraction 25 to 30%, mild mitral regurgitation and biatrial enlargement. Carotid Dopplers October 2019 showed 1 to 39% bilateral stenosis. Cardiac catheterization October 2019 showed no coronary disease. Holter monitor November 2019 showed sinus rhythm with PACs, brief PAT, PVCs, occasional couplet and 3 beats nonsustained ventricular tachycardia. Cardiac MRI November 2020 showed ejection fraction 42% with no evidence of infiltrative cardiomyopathy. Since last seen the patient has dyspnea with more extreme activities but not with routine activities. It is relieved with rest. It is not associated with chest pain. There is no orthopnea, PND or pedal edema. There is no syncope or palpitations. There is no exertional chest pain.  The patient does not have symptoms concerning for COVID-19 infection (fever, chills, cough, or new shortness of  breath).    Past Medical History:  Diagnosis Date  . Anemia   . Axillary mass 7/99   Left  . Barrett's esophagus   . Blood transfusion without reported diagnosis   . CARCINOMA, BREAST 1995   left  . Cervical spinal stenosis   . 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  . Headache(784.0)   . HEMORRHOIDS   . Hypertension   . Personal history of chemotherapy   . Personal history of radiation therapy    Past Surgical History:  Procedure Laterality Date  . BIOPSY  08/10/2019   Procedure: BIOPSY;  Surgeon: Mauri Pole, MD;  Location: [facility];  Service: Endoscopy;;  . BONE MARROW TRANSPLANT  1999  . BREAST LUMPECTOMY Left 11/95   left breast  . BUNIONECTOMY  3/09, 10/09   right and left  . COLON SURGERY    . COLONOSCOPY    . COLONOSCOPY WITH PROPOFOL [name] 08/10/2019   Procedure: COLONOSCOPY WITH PROPOFOL;  Surgeon: Mauri Pole, MD;  [facility];  Service: Endoscopy;  Laterality: [name];  . ERCP  01/21/2012   Procedure: ENDOSCOPIC RETROGRADE CHOLANGIOPANCREATOGRAPHY (ERCP);  Surgeon: Lafayette Dragon, MD;  Location: [facility];  Service: Endoscopy;  Laterality: [name];  . hemicolectomy  01/2005   right  . HYSTEROSCOPY  08/30/94  . INGUINAL HERNIA REPAIR    . LEFT HEART CATH AND CORONARY ANGIOGRAPHY [name] 05/21/2018   Procedure: LEFT HEART CATH AND CORONARY ANGIOGRAPHY;  Surgeon: Nelva Bush, MD;  Location: [place] [facility];  Service: Cardiovascular;  Laterality: [name];  . NECK MASS EXCISION Left 7/99  . ROTATOR CUFF REPAIR Right 09/08/13   full tear  . TOTAL ABDOMINAL HYSTERECTOMY W/ BILATERAL  SALPINGOOPHORECTOMY  1996  . TUNNELED VENOUS CATHETER PLACEMENT  9/98     Current Meds  Medication Sig  . acetaminophen (TYLENOL) 500 MG tablet Take 500 mg by mouth every 6 (six) hours as needed for moderate pain or headache.  . b complex vitamins tablet Take 1 tablet by mouth 2 (two) times a week.   . Calcium Carb-Cholecalciferol (CALCIUM 500+D PO) Take 1 tablet by  mouth daily.  Marland Kitchen losartan (COZAAR) 50 MG tablet Take 50 mg by mouth 2 (two) times daily.   . metoprolol succinate (TOPROL-XL) 50 MG 24 hr tablet Take 1 tablet (50 mg total) by mouth daily. Must keep scheduled appt for future refills  . Multiple Minerals (CALCIUM-MAGNESIUM-ZINC) TABS Take 1 tablet by mouth 2 (two) times a week.   . Multiple Vitamin (MULTIVITAMIN WITH MINERALS) TABS tablet Take 1 tablet by mouth daily.  Marland Kitchen omeprazole (PRILOSEC) 20 MG capsule Take 1 capsule (20 mg total) by mouth daily.  . vitamin B-12 (CYANOCOBALAMIN) 500 MCG tablet Take 500 mcg by mouth See admin instructions. Take 500 mcg 5 days weekly, takes on non b complex days  . vitamin C (ASCORBIC ACID) 500 MG tablet Take 500 mg by mouth 2 (two) times daily.   . Vitamin D, Ergocalciferol, (DRISDOL) 50000 UNITS CAPS Take 50,000 Units by mouth every Wednesday.   . vitamin E 400 UNIT capsule Take 400 Units by mouth daily.     Allergies:   Levofloxacin and Tape   Social History   Tobacco Use  . Smoking status: Former Smoker    Quit date: 12/20/1990    Years since quitting: 28.7  . Smokeless tobacco: Never Used  Substance Use Topics  . Alcohol use: Yes    Alcohol/week: 2.0 standard drinks    Types: 2 Standard drinks or equivalent per week  . Drug use: No     Family Hx: The patient's family history includes Heart attack in her father; Uterine cancer in her mother. There is no history of Colon cancer, Esophageal cancer, Stomach cancer, or Rectal cancer.  ROS:   Please see the history of present illness.    No Fever, chills  or productive cough All other systems reviewed and are negative.   Recent Labs: 03/31/2019: BUN 15; Potassium 5.1; Sodium 142 06/22/2019: Creatinine, Ser 0.86   Recent Lipid Panel Lab Results  Component Value Date/Time   CHOL 161 11/05/2011 09:59 AM   TRIG 57 11/05/2011 09:59 AM   HDL 75 11/05/2011 09:59 AM   CHOLHDL 2.1 11/05/2011 09:59 AM   LDLCALC 75 11/05/2011 09:59 AM    Wt  Readings from Last 3 Encounters:  08/26/19 145 lb (65.8 kg)  08/04/19 145 lb (65.8 kg)  05/25/19 144 lb (65.3 kg)     Objective:    Vital Signs:  BP (!) 139/95   Pulse 72   Ht 5\' 4"  (1.626 m)   Wt 145 lb (65.8 kg)   LMP 07/30/1995   BMI 24.89 kg/m    VITAL SIGNS:  reviewed NAD Answers questions appropriately Normal affect Remainder of physical examination not performed (telehealth visit; coronavirus pandemic)  ASSESSMENT & PLAN:    1. Nonischemic cardiomyopathy-as outlined in previous notes etiology is unclear.  She had a previous catheterization revealing no coronary disease.  No history of alcohol abuse or uncontrolled hypertension.  Follow-up cardiac MRI showed ejection fraction 42%.  Plan to continue medical therapy.  Continue Toprol.  Discontinue losartan and treat with Entresto 49/51 twice daily.  Check potassium and  renal function in 1 week.  Will titrate medications as tolerated.  Plan repeat echocardiogram after medications fully titrated. 2. History of PVCs-previous monitor not suggestive that PVCs or ectopy causing cardiomyopathy.  Continue beta-blocker. 3. Hypertension-blood pressure elevated.  Hypertension may be contributing to cardiomyopathy.  Change losartan to [facility] as outlined above.  I have asked her to contact us with follow-up blood pressures.  We will advance regimen as needed.  COVID-19 Education: The importance of social distancing was discussed today.  Time:   Today, I have spent 16 minutes with the patient with telehealth technology discussing the above problems.     Medication Adjustments/Labs and Tests Ordered: Current medicines are reviewed at length with the patient today.  Concerns regarding medicines are outlined above.   Tests Ordered: No orders of the defined types were placed in this encounter.   Medication Changes: No orders of the defined types were placed in this encounter.   Follow Up:  Either In Person or Virtual in 3 month(s)   Signed, Kirk Ruths, MD  08/26/2019 9:16 AM    [place]

## 2019-08-24 MED FILL — ALPRAZolam 0.5 MG TABS: 0.5 | 1 days supply | Qty: 4 | Fill #0

## 2019-08-25 DIAGNOSIS — I8312 Varicose veins of left lower extremity with inflammation: Secondary | ICD-10-CM | POA: Diagnosis not present

## 2019-08-26 ENCOUNTER — Encounter: Payer: Self-pay | Admitting: Cardiology

## 2019-08-26 ENCOUNTER — Other Ambulatory Visit: Payer: Self-pay | Admitting: Cardiology

## 2019-08-26 ENCOUNTER — Telehealth (INDEPENDENT_AMBULATORY_CARE_PROVIDER_SITE_OTHER): Payer: Medicare Other | Admitting: Cardiology

## 2019-08-26 ENCOUNTER — Telehealth: Payer: Self-pay | Admitting: Cardiology

## 2019-08-26 VITALS — BP 139/95 | HR 72 | Ht 64.0 in | Wt 145.0 lb

## 2019-08-26 DIAGNOSIS — I429 Cardiomyopathy, unspecified: Secondary | ICD-10-CM | POA: Diagnosis not present

## 2019-08-26 DIAGNOSIS — I10 Essential (primary) hypertension: Secondary | ICD-10-CM | POA: Diagnosis not present

## 2019-08-26 DIAGNOSIS — I493 Ventricular premature depolarization: Secondary | ICD-10-CM

## 2019-08-26 MED ORDER — ENTRESTO 49-51 MG PO TABS: 1.0000 | ORAL_TABLET | Freq: Two times a day (BID) | ORAL | 3 refills | Status: DC

## 2019-08-26 MED ORDER — ENTRESTO 49-51 MG PO TABS: 1.0000 | ORAL_TABLET | Freq: Two times a day (BID) | ORAL | 2 refills | Status: DC

## 2019-08-26 MED FILL — ENTRESTO 49 MG-51 MG TABLET: 49-51 | 30 days supply | Qty: 60 | Fill #0

## 2019-08-26 NOTE — Telephone Encounter (Signed)
Pt c/o medication issue:  1. Name of Medication:  metoprolol succinate (TOPROL-XL) 50 MG 24 hr tablet  2. How are you currently taking this medication (dosage and times per day)? 1 tablet once a day  3. Are you having a reaction (difficulty breathing--STAT)? no  4. What is your medication issue? Patient would like to know if she needs to continue taking this medication with her sacubitril-valsartan (ENTRESTO) 49-51 MG

## 2019-08-26 NOTE — Telephone Encounter (Signed)
*  STAT* If patient is at the pharmacy, call can be transferred to refill team.   1. Which medications need to be refilled? (please list name of each medication and dose if known) sacubitril-valsartan (ENTRESTO) 49-51 MG  2. Which pharmacy/location (including street and city if local pharmacy) is medication to be sent to? [id number] - [place], [place] - [address]  3. Do they need a 30 day or 90 day supply? 90  Patient states it was too expensive at the [facility].

## 2019-08-26 NOTE — Patient Instructions (Signed)
Medication Instructions:  STOP Losartan START Entresto 49/51 mg (1 tablet) two times daily  *If you need a refill on your cardiac medications before your next appointment, please call your pharmacy*  Lab Work: Your physician recommends that you return for lab work in: 1 week (BMET)  If you have labs (blood work) drawn today and your tests are completely normal, you will receive your results only by: Marland Kitchen MyChart Message (if you have MyChart) OR . A paper copy in the mail If you have any lab test that is abnormal or we need to change your treatment, we will call you to review the results.  Testing/Procedures: NONE  Follow-Up: At [facility], you and your health needs are our priority.  As part of our continuing mission to provide you with exceptional heart care, we have created designated Provider Care Teams.  These Care Teams include your primary Cardiologist (physician) and Advanced Practice Providers (APPs -  Physician Assistants and Nurse Practitioners) who all work together to provide you with the care you need, when you need it.  Your next appointment:   3 month(s)  The format for your next appointment:   In Person  Provider:   Kirk Ruths, MD  Other Instructions Continue to monitor blood pressure at home.  If blood pressure consistently is >130/80, please call to let us know.  Avoid salty food at home

## 2019-08-26 NOTE — Telephone Encounter (Signed)
SPOKE WITH PT ALL QUESTIONS ANSWERED SHE WILL GO TO [facility] FOR REFILL

## 2019-08-26 NOTE — Telephone Encounter (Signed)
Reviewed note from today's visit and pt is to stop Losartan and start Entresto 49/51 mg twice daily  No other changes Pt aware and verbalizes understanding./cy

## 2019-08-26 NOTE — Telephone Encounter (Signed)
*  STAT* If patient is at the pharmacy, call can be transferred to refill team.   1. Which medications need to be refilled? (please list name of each medication and dose if known) sacubitril-valsartan (ENTRESTO) 49-51 MG  2. Which pharmacy/location (including street and city if local pharmacy) is medication to be sent to? [place], [place] - [place]  3. Do they need a 30 day or 90 day supply? 30 day  Patient states she is supposed to get 30 days for free, but only at [facility].

## 2019-08-26 NOTE — Telephone Encounter (Signed)
Sent in new rx with free 30d card Eileen [name] [id number]; [id number]; [id number]; [id number] free 30day card Pt notified

## 2019-08-27 DIAGNOSIS — I8312 Varicose veins of left lower extremity with inflammation: Secondary | ICD-10-CM | POA: Diagnosis not present

## 2019-08-29 ENCOUNTER — Encounter (HOSPITAL_COMMUNITY): Payer: Self-pay | Admitting: Emergency Medicine

## 2019-08-29 ENCOUNTER — Telehealth: Payer: Self-pay | Admitting: Cardiology

## 2019-08-29 ENCOUNTER — Other Ambulatory Visit: Payer: Self-pay

## 2019-08-29 ENCOUNTER — Emergency Department (HOSPITAL_COMMUNITY)
Admission: EM | Admit: 2019-08-29 | Discharge: 2019-08-29 | Disposition: A | Discharge status: Home / Self Care | Payer: Medicare Other | Attending: Emergency Medicine | Admitting: Emergency Medicine

## 2019-08-29 DIAGNOSIS — R29810 Facial weakness: Secondary | ICD-10-CM | POA: Diagnosis not present

## 2019-08-29 DIAGNOSIS — Z85038 Personal history of other malignant neoplasm of large intestine: Secondary | ICD-10-CM | POA: Insufficient documentation

## 2019-08-29 DIAGNOSIS — I493 Ventricular premature depolarization: Secondary | ICD-10-CM

## 2019-08-29 DIAGNOSIS — Z853 Personal history of malignant neoplasm of breast: Secondary | ICD-10-CM | POA: Insufficient documentation

## 2019-08-29 DIAGNOSIS — I10 Essential (primary) hypertension: Secondary | ICD-10-CM | POA: Diagnosis not present

## 2019-08-29 DIAGNOSIS — Z79899 Other long term (current) drug therapy: Secondary | ICD-10-CM | POA: Insufficient documentation

## 2019-08-29 DIAGNOSIS — Z20822 Contact with and (suspected) exposure to covid-19: Secondary | ICD-10-CM | POA: Insufficient documentation

## 2019-08-29 DIAGNOSIS — R0602 Shortness of breath: Secondary | ICD-10-CM | POA: Diagnosis not present

## 2019-08-29 DIAGNOSIS — Z8673 Personal history of transient ischemic attack (TIA), and cerebral infarction without residual deficits: Secondary | ICD-10-CM | POA: Insufficient documentation

## 2019-08-29 DIAGNOSIS — I472 Ventricular tachycardia: Secondary | ICD-10-CM | POA: Diagnosis not present

## 2019-08-29 DIAGNOSIS — I428 Other cardiomyopathies: Secondary | ICD-10-CM

## 2019-08-29 DIAGNOSIS — R42 Dizziness and giddiness: Secondary | ICD-10-CM

## 2019-08-29 DIAGNOSIS — Z87891 Personal history of nicotine dependence: Secondary | ICD-10-CM | POA: Diagnosis not present

## 2019-08-29 DIAGNOSIS — I491 Atrial premature depolarization: Secondary | ICD-10-CM | POA: Diagnosis not present

## 2019-08-29 DIAGNOSIS — R61 Generalized hyperhidrosis: Secondary | ICD-10-CM | POA: Diagnosis not present

## 2019-08-29 DIAGNOSIS — Z923 Personal history of irradiation: Secondary | ICD-10-CM | POA: Diagnosis not present

## 2019-08-29 DIAGNOSIS — R55 Syncope and collapse: Secondary | ICD-10-CM

## 2019-08-29 DIAGNOSIS — R531 Weakness: Secondary | ICD-10-CM | POA: Diagnosis not present

## 2019-08-29 DIAGNOSIS — Z9221 Personal history of antineoplastic chemotherapy: Secondary | ICD-10-CM | POA: Insufficient documentation

## 2019-08-29 LAB — COMPREHENSIVE METABOLIC PANEL
ALT: 26 U/L (ref 0–44)
AST: 23 U/L (ref 15–41)
Albumin: 3.8 g/dL (ref 3.5–5.0)
Alkaline Phosphatase: 58 U/L (ref 38–126)
Anion gap: 11 (ref 5–15)
BUN: 19 mg/dL (ref 8–23)
CO2: 23 mmol/L (ref 22–32)
Calcium: 9.1 mg/dL (ref 8.9–10.3)
Chloride: 106 mmol/L (ref 98–111)
Creatinine, Ser: 0.76 mg/dL (ref 0.44–1.00)
GFR calc Af Amer: >60 mL/min (ref >60)
GFR calc non Af Amer: >60 mL/min (ref >60)
Glucose, Bld: 105 mg/dL — ABNORMAL HIGH (ref 70–99)
Potassium: 4.1 mmol/L (ref 3.5–5.1)
Sodium: 140 mmol/L (ref 135–145)
Total Bilirubin: 0.8 mg/dL (ref 0.3–1.2)
Total Protein: 6.4 g/dL — ABNORMAL LOW (ref 6.5–8.1)

## 2019-08-29 LAB — URINALYSIS, ROUTINE W REFLEX MICROSCOPIC
Bilirubin Urine: NEGATIVE
Glucose, UA: NEGATIVE mg/dL
Hgb urine dipstick: NEGATIVE
Ketones, ur: NEGATIVE mg/dL
Leukocytes,Ua: NEGATIVE
Nitrite: NEGATIVE
Protein, ur: NEGATIVE mg/dL
Specific Gravity, Urine: 1.01 (ref 1.005–1.030)
pH: 5 (ref 5.0–8.0)

## 2019-08-29 LAB — CBC WITH DIFFERENTIAL/PLATELET
Abs Immature Granulocytes: 0.03 10*3/uL (ref 0.00–0.07)
Basophils Absolute: 0 10*3/uL (ref 0.0–0.1)
Basophils Relative: 0 %
Eosinophils Absolute: 0 10*3/uL (ref 0.0–0.5)
Eosinophils Relative: 0 %
HCT: 45 % (ref 36.0–46.0)
Hemoglobin: 14.6 g/dL (ref 12.0–15.0)
Immature Granulocytes: 0 %
Lymphocytes Relative: 15 %
Lymphs Abs: 1.3 10*3/uL (ref 0.7–4.0)
MCH: 28.3 pg (ref 26.0–34.0)
MCHC: 32.4 g/dL (ref 30.0–36.0)
MCV: 87.2 fL (ref 80.0–100.0)
Monocytes Absolute: 0.5 10*3/uL (ref 0.1–1.0)
Monocytes Relative: 6 %
Neutro Abs: 7 10*3/uL (ref 1.7–7.7)
Neutrophils Relative %: 79 %
Platelets: 232 10*3/uL (ref 150–400)
RBC: 5.16 MIL/uL — ABNORMAL HIGH (ref 3.87–5.11)
RDW: 12.8 % (ref 11.5–15.5)
WBC: 8.9 10*3/uL (ref 4.0–10.5)
nRBC: 0 % (ref 0.0–0.2)

## 2019-08-29 LAB — RESPIRATORY PANEL BY RT PCR (FLU A&B, COVID)
Influenza A by PCR: NEGATIVE
Influenza B by PCR: NEGATIVE
SARS Coronavirus 2 by RT PCR: NEGATIVE

## 2019-08-29 LAB — LIPASE, BLOOD: Lipase: 35 U/L (ref 11–51)

## 2019-08-29 LAB — I-STAT CHEM 8, ED
BUN: 19 mg/dL (ref 8–23)
Calcium, Ion: 1.19 mmol/L (ref 1.15–1.40)
Chloride: 108 mmol/L (ref 98–111)
Creatinine, Ser: 0.6 mg/dL (ref 0.44–1.00)
Glucose, Bld: 102 mg/dL — ABNORMAL HIGH (ref 70–99)
HCT: 44 % (ref 36.0–46.0)
Hemoglobin: 15 g/dL (ref 12.0–15.0)
Potassium: 3.9 mmol/L (ref 3.5–5.1)
Sodium: 142 mmol/L (ref 135–145)
TCO2: 27 mmol/L (ref 22–32)

## 2019-08-29 LAB — TROPONIN I (HIGH SENSITIVITY)
Troponin I (High Sensitivity): 7 ng/L (ref <18)
Troponin I (High Sensitivity): 10 ng/L (ref <18)

## 2019-08-29 LAB — TSH: TSH: 5.904 u[IU]/mL — ABNORMAL HIGH (ref 0.350–4.500)

## 2019-08-29 LAB — PHOSPHORUS: Phosphorus: 2.7 mg/dL (ref 2.5–4.6)

## 2019-08-29 LAB — LACTIC ACID, PLASMA
Lactic Acid, Venous: 1.1 mmol/L (ref 0.5–1.9)
Lactic Acid, Venous: 0.8 mmol/L (ref 0.5–1.9)

## 2019-08-29 LAB — D-DIMER, QUANTITATIVE: D-Dimer, Quant: 0.56 ug/mL-FEU — ABNORMAL HIGH (ref 0.00–0.50)

## 2019-08-29 LAB — BRAIN NATRIURETIC PEPTIDE: B Natriuretic Peptide: 265.6 pg/mL — ABNORMAL HIGH (ref 0.0–100.0)

## 2019-08-29 LAB — PROTIME-INR
INR: 0.9 (ref 0.8–1.2)
Prothrombin Time: 12.4 seconds (ref 11.4–15.2)

## 2019-08-29 LAB — MAGNESIUM: Magnesium: 1.9 mg/dL (ref 1.7–2.4)

## 2019-08-29 MED ORDER — METOPROLOL SUCCINATE ER 25 MG PO TB24: 25.0000 mg | ORAL_TABLET | Freq: Every day | ORAL | 0 refills | Status: DC

## 2019-08-29 MED ORDER — SODIUM CHLORIDE 0.9 % IV SOLN: Freq: Once | INTRAVENOUS | Status: DC

## 2019-08-29 MED ORDER — METOPROLOL SUCCINATE ER 25 MG PO TB24
50.0000 mg | ORAL_TABLET | Freq: Every day | ORAL | Status: DC
  Administered 2019-08-29: 50 mg via ORAL
  Filled 2019-08-29: qty 2

## 2019-08-29 NOTE — ED Provider Notes (Addendum)
[facility] EMERGENCY DEPARTMENT Provider Note   CSN: [id number] Arrival date & time: 08/29/19  [id number]     History Chief Complaint  Patient presents with  . Near Syncope    Eileen [name] is a 74 y.o. female.  HPI Patient reports that she has been well recently.  No fevers, no chills, no cough.  She reports last night she got up at about 1:45 in the morning to go to the bathroom which she normally does.  She became intensely weak and sweaty, she reports she felt short of breath.  No chest pressure or chest pain.  Patient reports that the symptom abated enough that she was able to go back to bed.  She got up at about 6 in the morning to use the bathroom again.  Again, she became extremely sweaty and lightheaded.  He felt short of breath and as if she might pass out.  Again, no chest pain.  Patient denies she was having any abdominal pain.  She reports it would have been a typical time for her to get up to use the restroom.  Fortunately, she was able to call her husband.  He assisted her and she did not go on to have a loss of consciousness.  They did call EMS.  Patient reports only significant change recently was that losartan was discontinued and Entresto was started 2 days ago for adjustments done by Dr. Stanford Breed.    Past Medical History:  Diagnosis Date  . Anemia   . Axillary mass 7/99   Left  . Barrett's esophagus   . Blood transfusion without reported diagnosis   . CARCINOMA, BREAST 1995   left  . Cervical spinal stenosis   . 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  . Headache(784.0)   . HEMORRHOIDS   . Hypertension   . Personal history of chemotherapy   . Personal history of radiation therapy     Patient Active Problem List   Diagnosis Date Noted  . Abdominal pain, left upper quadrant   . Polyp of descending colon   . Chronic systolic heart failure ([place]) 05/21/2018  . Cardiomyopathy ([place])   . Hypertension 11/06/2017  . Complete rotator cuff rupture of left  shoulder 09/21/2014  . Abdominal pain, epigastric 08/24/2013  . Belching 08/24/2013  . Other abnormal blood chemistry 01/21/2012  . Nonspecific (abnormal) findings on radiological and other examination of biliary tract 01/21/2012  . TIA (transient ischemic attack) 11/05/2011  . PVC's (premature ventricular contractions) 10/11/2011  . Nonspecific abnormal unspecified cardiovascular function study 12/20/2010  . Malignant neoplasm of female breast ([place]) 08/23/2008  . HEMORRHOIDS 08/23/2008  . CONSTIPATION, CHRONIC 08/23/2008  . RECTAL BLEEDING 08/23/2008  . History of colon cancer 08/23/2008  . Malignant neoplasm of colon ([place]) 01/26/2005    Past Surgical History:  Procedure Laterality Date  . BIOPSY  08/10/2019   Procedure: BIOPSY;  Surgeon: Mauri Pole, MD;  [facility];  Service: Endoscopy;;  . BONE MARROW TRANSPLANT  1999  . BREAST LUMPECTOMY Left 11/95   left breast  . BUNIONECTOMY  3/09, 10/09   right and left  . COLON SURGERY    . COLONOSCOPY    . COLONOSCOPY WITH PROPOFOL [name] 08/10/2019   Procedure: COLONOSCOPY WITH PROPOFOL;  Surgeon: Mauri Pole, MD;  [facility];  Service: Endoscopy;  Laterality: [name];  . ERCP  01/21/2012   Procedure: ENDOSCOPIC RETROGRADE CHOLANGIOPANCREATOGRAPHY (ERCP);  Surgeon: Lafayette Dragon, MD;  [facility];  Service: Endoscopy;  Laterality: [name];  . hemicolectomy  01/2005   right  . HYSTEROSCOPY  08/30/94  . INGUINAL HERNIA REPAIR    . LEFT HEART CATH AND CORONARY ANGIOGRAPHY [name] 05/21/2018   Procedure: LEFT HEART CATH AND CORONARY ANGIOGRAPHY;  Surgeon: Nelva Bush, MD;  Location: [place] [facility];  Service: Cardiovascular;  Laterality: [name];  . NECK MASS EXCISION Left 7/99  . ROTATOR CUFF REPAIR Right 09/08/13   full tear  . TOTAL ABDOMINAL HYSTERECTOMY W/ BILATERAL SALPINGOOPHORECTOMY  1996  . TUNNELED VENOUS CATHETER PLACEMENT  9/98     OB History    Gravida  3   Para  2   Term       Preterm      AB      Living  2     SAB      TAB      Ectopic      Multiple      Live Births              Family History  Problem Relation Age of Onset  . Uterine cancer Mother   . Heart attack Father   . Colon cancer Neg Hx   . Esophageal cancer Neg Hx   . Stomach cancer Neg Hx   . Rectal cancer Neg Hx     Social History   Tobacco Use  . Smoking status: Former Smoker    Quit date: 12/20/1990    Years since quitting: 28.7  . Smokeless tobacco: Never Used  Substance Use Topics  . Alcohol use: Yes    Alcohol/week: 2.0 standard drinks    Types: 2 Standard drinks or equivalent per week  . Drug use: No    Home Medications Prior to Admission medications   Medication Sig Start Date End Date Taking? Authorizing Provider  acetaminophen (TYLENOL) 500 MG tablet Take 500 mg by mouth every 6 (six) hours as needed for moderate pain or headache.   Yes [provider]  b complex vitamins tablet Take 1 tablet by mouth 2 (two) times a week.    Yes [provider]  Calcium Carb-Cholecalciferol (CALCIUM 500+D PO) Take 1 tablet by mouth daily.   Yes [provider]  colestipol (COLESTID) 1 g tablet Take 1 g by mouth daily after lunch.   Yes [provider]  metoprolol succinate (TOPROL-XL) 50 MG 24 hr tablet Take 1 tablet (50 mg total) by mouth daily. Must keep scheduled appt for future refills 08/13/19  Yes Crenshaw, Denice Bors, MD  Multiple Minerals (CALCIUM-MAGNESIUM-ZINC) TABS Take 1 tablet by mouth 2 (two) times a week.    Yes [provider]  Multiple Vitamin (MULTIVITAMIN WITH MINERALS) TABS tablet Take 1 tablet by mouth daily.   Yes [provider]  omeprazole (PRILOSEC) 20 MG capsule Take 1 capsule (20 mg total) by mouth daily. 08/05/17  Yes Nandigam, Venia Minks, MD  sacubitril-valsartan (ENTRESTO) 49-51 MG Take 1 tablet by mouth 2 (two) times daily. Delene Loll [id number]  free 30day card 08/26/19  Yes Lelon Perla, MD  vitamin B-12 (CYANOCOBALAMIN) 500 MCG tablet Take 500 mcg by mouth See admin instructions. Take 500 mcg 5 days weekly, takes on non b complex days   Yes [provider]  vitamin C (ASCORBIC ACID) 500 MG tablet Take 500 mg by mouth 2 (two) times daily.    Yes [provider]  Vitamin D, Ergocalciferol, (DRISDOL) 50000 UNITS CAPS Take 50,000 Units by mouth  every Wednesday.  08/28/11  Yes [provider]  vitamin E 400 UNIT capsule Take 400 Units by mouth daily.   Yes [provider]    Allergies    Levofloxacin and Tape  Review of Systems   Review of Systems 10 Systems reviewed and are negative for acute change except as noted in the HPI.  Physical Exam Updated Vital Signs BP 130/90   Pulse (!) 57   Temp 98.5 F (36.9 C) (Oral)   Resp 16   Ht 5\' 4"  (1.626 m)   Wt 65.8 kg   LMP 07/30/1995   SpO2 96%   BMI 24.89 kg/m   Physical Exam Constitutional:      Appearance: Normal appearance. She is well-developed.  HENT:     Head: Normocephalic and atraumatic.  Eyes:     Extraocular Movements: Extraocular movements intact.  Cardiovascular:     Rate and Rhythm: Normal rate and regular rhythm.     Heart sounds: Normal heart sounds.     Comments: Occasional ectopic beats.  Monitor shows sinus rhythm rates about in the 90s.  Intermittently patient will have frequent PVCs. Pulmonary:     Effort: Pulmonary effort is normal.     Breath sounds: Normal breath sounds.  Abdominal:     General: Bowel sounds are normal. There is no distension.     Palpations: Abdomen is soft.     Tenderness: There is no abdominal tenderness.  Musculoskeletal:        General: No swelling or tenderness. Normal range of motion.     Cervical back: Neck supple.     Right lower leg: No edema.     Left lower leg: No edema.     Comments: No peripheral edema.  Calves are soft and nontender.  Skin:    General: Skin is warm and  dry.  Neurological:     General: No focal deficit present.     Mental Status: She is alert and oriented to person, place, and time.     GCS: GCS eye subscore is 4. GCS verbal subscore is 5. GCS motor subscore is 6.     Coordination: Coordination normal.  Psychiatric:        Mood and Affect: Mood normal.     ED Results / Procedures / Treatments   Labs (all labs ordered are listed, but only abnormal results are displayed) Labs Reviewed  COMPREHENSIVE METABOLIC PANEL - Abnormal; Notable for the following components:      Result Value   Glucose, Bld 105 (*)    Total Protein 6.4 (*)    All other components within normal limits  BRAIN NATRIURETIC PEPTIDE - Abnormal; Notable for the following components:   B Natriuretic Peptide 265.6 (*)    All other components within normal limits  CBC WITH DIFFERENTIAL/PLATELET - Abnormal; Notable for the following components:   RBC 5.16 (*)    All other components within normal limits  D-DIMER, QUANTITATIVE (NOT AT [facility]) - Abnormal; Notable for the following components:   D-Dimer, Quant 0.56 (*)    All other components within normal limits  TSH - Abnormal; Notable for the following components:   TSH 5.904 (*)    All other components within normal limits  I-STAT CHEM 8, ED - Abnormal; Notable for the following components:   Glucose, Bld 102 (*)    All other components within normal limits  RESPIRATORY PANEL BY RT PCR (FLU A&B, COVID)  LIPASE, BLOOD  LACTIC ACID, PLASMA  PROTIME-INR  URINALYSIS, ROUTINE W REFLEX MICROSCOPIC  MAGNESIUM  PHOSPHORUS  LACTIC ACID, PLASMA  TROPONIN I (HIGH SENSITIVITY)  TROPONIN I (HIGH SENSITIVITY)    EKG EKG Interpretation  Date/Time:  Sunday August 29 2019 08:26:50 EST Ventricular Rate:  86 PR Interval:    QRS Duration: 109 QT Interval:  408 QTC Calculation: 488 R Axis:   109 Text Interpretation: Sinus rhythm Multiple premature complexes, vent & supraven agree, no acute ischemic changes Confirmed by  Charlesetta Shanks [phone]) on 08/29/2019 10:19:38 AM   Radiology No results found.  Procedures Procedures (including critical care time)  Medications Ordered in ED Medications - No data to display  ED Course  I have reviewed the triage vital signs and the nursing notes.  Pertinent labs & imaging results that were available during my care of the patient were reviewed by me and considered in my medical decision making (see chart for details).    MDM Rules/Calculators/A&P                     Patient with known history of cardiomyopathy.  She had 2 episodes in the past 12 hours of near syncope with diaphoresis and dyspnea.  EKG does not show STEMI pattern.  There are periods whereupon she is having frequent PVCs.  First troponin is normal.  Will proceed with cardiology consultation.  Consult: Reviewed with Dr. Rayann Heman.  Emergency department consult will be done.  Patient presents as outlined above.  Describes symptoms are for near syncope.  In conjunction with this she was experiencing intense generalized weakness and diaphoresis.  Patient denies that she is experiencing any abdominal pain or focal pain at the time.  She did perceive herself to be short of breath.  Telephone encounter note from cardiology this a.m. indicated consideration for strokelike symptoms.  At this time, I have low suspicion for CVA or TIA.  Dominant symptoms are for generalized weakness and feeling of near syncope.  My history did not elicit typical stroke symptoms.  Patient's exam is for normal neurologic exam with clear mental status with normal speech and cognitive function and no focal motor deficits.  Patient does have history of cardiomyopathy and documented in previous notes some self-limited episodes of VT.  While in the emergency department on monitor, patient does have sinus rhythm but episodically, frequent PVCs.  Concern for possible dysrhythmia as etiology of near syncope.  Patient has had a recent change to her  antihypertensive medications.  She did have Entresto started and Cozaar discontinued.  However, there is no documented hypotension.  Patient was hypertensive on arrival and did not have any positive orthostatic findings.  Will await cardiology input for additional disposition and diagnostic planning.  Cardiology has seen the patient and reviewed treatment options.  They report at this time plan will be for discharge and follow-up in the office.  Request that the increase the patient's Toprol dose to 75 mg daily.  Final Clinical Impression(s) / ED Diagnoses Final diagnoses:  Near syncope  Weakness  Diaphoresis    Rx / DC Orders ED Discharge Orders    None       Charlesetta Shanks, MD 08/29/19 1040    Charlesetta Shanks, MD 08/29/19 1255    Charlesetta Shanks, MD 08/29/19 1258    Charlesetta Shanks, MD 08/29/19 1520

## 2019-08-29 NOTE — Discharge Instructions (Addendum)
1.  The cardiologist recommends increasing your total dose of Toprol to 75 mg daily.  Take your 50 mg tablet and the newly prescribed 25 mg tablet at the same time. 2.  Follow-up with your cardiologist as discussed. 3.  Return to the emergency department if you have any concerning or worsening symptoms.

## 2019-08-29 NOTE — Telephone Encounter (Signed)
I returned a call to the patient.   She reports that she has been feeling unwell tonight. She woke up at 2 am and went to the bathroom to use the toilet and felt as though she was going to lose consciousness. She felt better after several minutes and went back to sleep. However, she got up again this morning and now feels nauseated and again almost lost consciousness. She reports that her L arm is now numb, which is new for her.  Given the unclear nature of her symptoms, including possible stroke given the arm numbness, she was urged to call 911 immediately.

## 2019-08-29 NOTE — Consult Note (Addendum)
Cardiology Consultation:   Patient ID: Eileen [name] MRN: [id number]; DOB: 12-Jun-1946  Admit date: 08/29/2019 Date of Consult: 08/29/2019  Primary Care Provider: Shirline Frees, MD Primary Cardiologist: Kirk Ruths, MD  Primary Electrophysiologist:  None    Patient Profile:   Eileen [name] is a 74 y.o. female with a hx of  nonischemic cardiomyopathy, hypertension, history of breast cancer s/p radiation therapy and colon cancer s/p chemotherapy who is being seen today for the evaluation of syncope x 2 at the request of Dr. Johnney Killian.  History of Present Illness:   Eileen [name] is a 74 year old female with past medical history of nonischemic cardiomyopathy, hypertension, history of breast cancer s/p radiation therapy and colon cancer s/p chemotherapy.  Echocardiogram in October 2019 showed newly reduced LV EF of 25 to 30%, mild MR, biatrial enlargement.  Carotid Doppler in October 2019 showed mild disease bilaterally.  Subsequent cardiac catheterization showed normal coronary arteries.  24-hour Holter monitoring in November 2019 shows sinus rhythm, PACs, brief PAT, PVCs and occasional couplet.  PVC burden was 2.89% of total beats analyzed, supraventricular ectopy burden was 5.89% of total beats analyzed.  The overall burden was not felt to be significant to be responsible for the cardiomyopathy.  She did not tolerate carvedilol due to diarrhea, this was later switched to Toprol-XL and losartan.  Cardiac MRI obtained on 06/22/2019 showed LVEF 42%, small area of subepicardial LGE at the mid inferoseptal RV insertion site which is nonspecific, no infiltrative disease pattern was seen.  Patient was most recently seen virtually by Dr. Stanford Breed on 08/18/2019, losartan was discontinued and she was switched to Entresto 49-51 mg twice daily.  She has taken this medication for the past 2 days.  Early this morning around 1:45 AM, she got up to go to the bathroom.  She did not feel any dizziness getting up.   After she sat on the toilet to urinate and have a bowel movement, she started having dizziness and sweating episode.  She feels like she was going to pass out.  Afterward, she went back to sleep.  She woke up around 6 AM and trying to go to the bathroom to have another bowel movement.  The same symptom recurred with dizziness and sweating.  She eventually sought medical attention at [facility].  Initial lab work shows normal electrolyte and renal function.  Hemoglobin was normal.  BNP was mildly elevated at 265.6.  High-sensitivity troponin was negative at 7.  Lactic acid is normal.  D-dimer 0.56 which was mildly elevated.  TSH borderline elevated to 5.9.  Covid test negative.  Urinalysis test negative.  Cardiology service was consulted for presyncope.  While in the ED, she had a very frequent episode of PVCs, bigeminy, nonsustained VT up to 5 beats.   Heart Pathway Score:     Past Medical History:  Diagnosis Date  . Anemia   . Axillary mass 7/99   Left  . Barrett's esophagus   . Blood transfusion without reported diagnosis   . CARCINOMA, BREAST 1995   left  . Cervical spinal stenosis   . 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  . Headache(784.0)   . HEMORRHOIDS   . Hypertension   . Personal history of chemotherapy   . Personal history of radiation therapy     Past Surgical History:  Procedure Laterality Date  . BIOPSY  08/10/2019   Procedure: BIOPSY;  Surgeon: Mauri Pole, MD;  Location: [facility];  Service: Endoscopy;;  . BONE  MARROW TRANSPLANT  1999  . BREAST LUMPECTOMY Left 11/95   left breast  . BUNIONECTOMY  3/09, 10/09   right and left  . COLON SURGERY    . COLONOSCOPY    . COLONOSCOPY WITH PROPOFOL [name] 08/10/2019   Procedure: COLONOSCOPY WITH PROPOFOL;  Surgeon: Mauri Pole, MD;  Location: [facility];  Service: Endoscopy;  Laterality: [name];  . ERCP  01/21/2012   Procedure: ENDOSCOPIC RETROGRADE CHOLANGIOPANCREATOGRAPHY (ERCP);  Surgeon: Lafayette Dragon,  MD;  Location: [facility];  Service: Endoscopy;  Laterality: [name];  . hemicolectomy  01/2005   right  . HYSTEROSCOPY  08/30/94  . INGUINAL HERNIA REPAIR    . LEFT HEART CATH AND CORONARY ANGIOGRAPHY [name] 05/21/2018   Procedure: LEFT HEART CATH AND CORONARY ANGIOGRAPHY;  Surgeon: Nelva Bush, MD;  Location: [facility];  Service: Cardiovascular;  Laterality: [name];  . NECK MASS EXCISION Left 7/99  . ROTATOR CUFF REPAIR Right 09/08/13   full tear  . TOTAL ABDOMINAL HYSTERECTOMY W/ BILATERAL SALPINGOOPHORECTOMY  1996  . TUNNELED VENOUS CATHETER PLACEMENT  9/98     Home Medications:  Prior to Admission medications   Medication Sig Start Date End Date Taking? Authorizing Provider  acetaminophen (TYLENOL) 500 MG tablet Take 500 mg by mouth every 6 (six) hours as needed for moderate pain or headache.   Yes [provider]  b complex vitamins tablet Take 1 tablet by mouth 2 (two) times a week.    Yes [provider]  Calcium Carb-Cholecalciferol (CALCIUM 500+D PO) Take 1 tablet by mouth daily.   Yes [provider]  colestipol (COLESTID) 1 g tablet Take 1 g by mouth daily after lunch.   Yes [provider]  metoprolol succinate (TOPROL-XL) 50 MG 24 hr tablet Take 1 tablet (50 mg total) by mouth daily. Must keep scheduled appt for future refills 08/13/19  Yes Crenshaw, Denice Bors, MD  Multiple Minerals (CALCIUM-MAGNESIUM-ZINC) TABS Take 1 tablet by mouth 2 (two) times a week.    Yes [provider]  Multiple Vitamin (MULTIVITAMIN WITH MINERALS) TABS tablet Take 1 tablet by mouth daily.   Yes [provider]  omeprazole (PRILOSEC) 20 MG capsule Take 1 capsule (20 mg total) by mouth daily. 08/05/17  Yes Nandigam, Venia Minks, MD  sacubitril-valsartan (ENTRESTO) 49-51 MG Take 1 tablet by mouth 2 (two) times daily. Delene Loll [id number] free 30day card 08/26/19  Yes Lelon Perla, MD  vitamin  B-12 (CYANOCOBALAMIN) 500 MCG tablet Take 500 mcg by mouth See admin instructions. Take 500 mcg 5 days weekly, takes on non b complex days   Yes [provider]  vitamin C (ASCORBIC ACID) 500 MG tablet Take 500 mg by mouth 2 (two) times daily.    Yes [provider]  Vitamin D, Ergocalciferol, (DRISDOL) 50000 UNITS CAPS Take 50,000 Units by mouth every Wednesday.  08/28/11  Yes [provider]  vitamin E 400 UNIT capsule Take 400 Units by mouth daily.   Yes [provider]    Inpatient Medications: Scheduled Meds:  Continuous Infusions:  PRN Meds:   Allergies:    Allergies  Allergen Reactions  . Levofloxacin Other (See Comments)    Red streaks to IV site when IV dose infusing and arm feeling very irritated, throbbing, painful  . Tape     Can use paper tape, adhesive causes redness    Social History:   Social History   Socioeconomic History  . Marital status: Married  Spouse name: Not on file  . Number of children: 2  . Years of education: Not on file  . Highest education level: Not on file  Occupational History  . Not on file  Tobacco Use  . Smoking status: Former Smoker    Quit date: 12/20/1990    Years since quitting: 28.7  . Smokeless tobacco: Never Used  Substance and Sexual Activity  . Alcohol use: Yes    Alcohol/week: 2.0 standard drinks    Types: 2 Standard drinks or equivalent per week  . Drug use: No  . Sexual activity: Not Currently    Birth control/protection: Surgical    Comment: hysterectomy  Other Topics Concern  . Not on file  Social History Narrative  . Not on file   Social Determinants of Health   Financial Resource Strain:   . Difficulty of Paying Living Expenses: Not on file  Food Insecurity:   . Worried About Charity fundraiser in the Last Year: Not on file  . Ran Out of Food in the Last Year: Not on file  Transportation Needs:   . Lack of Transportation (Medical): Not on file  . Lack of  Transportation (Non-Medical): Not on file  Physical Activity:   . Days of Exercise per Week: Not on file  . Minutes of Exercise per Session: Not on file  Stress:   . Feeling of Stress : Not on file  Social Connections:   . Frequency of Communication with Friends and Family: Not on file  . Frequency of Social Gatherings with Friends and Family: Not on file  . Attends Religious Services: Not on file  . Active Member of Clubs or Organizations: Not on file  . Attends Archivist Meetings: Not on file  . Marital Status: Not on file  Intimate Partner Violence:   . Fear of Current or Ex-Partner: Not on file  . Emotionally Abused: Not on file  . Physically Abused: Not on file  . Sexually Abused: Not on file    Family History:    Family History  Problem Relation Age of Onset  . Uterine cancer Mother   . Heart attack Father   . Colon cancer Neg Hx   . Esophageal cancer Neg Hx   . Stomach cancer Neg Hx   . Rectal cancer Neg Hx      ROS:  Please see the history of present illness.   All other ROS reviewed and negative.     Physical Exam/Data:   Vitals:   08/29/19 1045 08/29/19 1115 08/29/19 1145 08/29/19 1200  BP: 128/71 125/77 114/80 130/90  Pulse: 82 (!) 56 61 (!) 57  Resp: 17 15 20 16   Temp:      TempSrc:      SpO2: 95% 96% 96% 96%  Weight:      Height:       No intake or output data in the 24 hours ending 08/29/19 1430 Last 3 Weights 08/29/2019 08/26/2019 08/04/2019  Weight (lbs) 145 lb 145 lb 145 lb  Weight (kg) 65.772 kg 65.772 kg 65.772 kg     Body mass index is 24.89 kg/m.  General:  Well nourished, well developed, in no acute distress HEENT: normal Lymph: no adenopathy Neck: no JVD Endocrine:  No thryomegaly Vascular: No carotid bruits; FA pulses 2+ bilaterally without bruits  Cardiac:  normal S1, S2; RRR; no murmur  Lungs:  clear to auscultation bilaterally, no wheezing, rhonchi or rales  Abd: soft, nontender, no hepatomegaly  Ext: no  edema Musculoskeletal:  No deformities, BUE and BLE strength normal and equal Skin: warm and dry  Neuro:  CNs 2-12 intact, no focal abnormalities noted Psych:  Normal affect   EKG:  The EKG was personally reviewed and demonstrates:  normal sinus rhythm, frequent PVCs. Telemetry:  Telemetry was personally reviewed and demonstrates:  NSR with frequent PVCs, occasional NSVT up to 5 beats  Relevant CV Studies:  Echo 05/13/2018 LV EF: 25% -  30%   Study Conclusions   - Left ventricle: The cavity size was normal. Wall thickness was  increased in a pattern of mild LVH. Systolic function was  severely reduced. The estimated ejection fraction was in the  range of 25% to 30%. Diffuse hypokinesis. Doppler parameters are  consistent with abnormal left ventricular relaxation (grade 1  diastolic dysfunction).  - Mitral valve: There was mild regurgitation.  - Left atrium: The atrium was mildly to moderately dilated.  - Right atrium: The atrium was mildly dilated.    Cardiac MRI 06/22/2019 Measurements:  LVEDV 179 mL LVSV 75 mL LVEF 42%  RVEDV 96 mL RVSV 63 mL RVEF 65%  IMPRESSION: 1. Normal LV size with EF 42%, diffuse hypokinesis.  2. Normal RV size and systolic function, EF 123456.  3. There was a small area of subepicardial LGE at the mid inferoseptal RV insertion site. This is nonspecific and can be seen with pressure/volume overload.  Laboratory Data:  High Sensitivity Troponin:   Recent Labs  Lab 08/29/19 0851 08/29/19 1215  TROPONINIHS 7 10     Chemistry Recent Labs  Lab 08/29/19 0851 08/29/19 0920  NA 140 142  K 4.1 3.9  CL 106 108  CO2 23  --   GLUCOSE 105* 102*  BUN 19 19  CREATININE 0.76 0.60  CALCIUM 9.1  --   GFRNONAA >60  --   GFRAA >60  --   ANIONGAP 11  --     Recent Labs  Lab 08/29/19 0851  PROT 6.4*  ALBUMIN 3.8  AST 23  ALT 26  ALKPHOS 58  BILITOT 0.8   Hematology Recent Labs  Lab 08/29/19 0851 08/29/19 0920   WBC 8.9  --   RBC 5.16*  --   HGB 14.6 15.0  HCT 45.0 44.0  MCV 87.2  --   MCH 28.3  --   MCHC 32.4  --   RDW 12.8  --   PLT 232  --    BNP Recent Labs  Lab 08/29/19 0851  BNP 265.6*    DDimer  Recent Labs  Lab 08/29/19 0851  DDIMER 0.56*     Radiology/Studies:  No results found. {  Assessment and Plan:   1. Presyncope: Both episode occurred this morning while patient sitting on the toilet having a bowel movement.  It is very possible she had presyncope related to overstimulation of the vagal tone.  However while in the ED, her heart rate would increase significantly when she change from a lying position to a sitting position. Question hypovolemia.              - discussed with Dr. Rayann Heman, plan to d/c with close outpatient followup within 1 week. Advise increase hydration. Continue entresto. Increase toprol xl to 75mg  daily.    2. History of nonischemic cardiomyopathy: Diagnosed in 2019.  EF was 25 to 30% the time.  Previous 24-hour Holter monitoring in 2019 had 2.89%  PVC burden and 5.89% of supraventricular ectopy burden.  At the time,  the degree of PVC burden was not significant enough to contribute to cardiomyopathy.  While in the emergency room, she has a very frequent bigeminy, PVCs, nonsustained VT, may need to reconsider possibility of PVC induced cardiomyopathy.  Recent cardiac MRI suggested EF is 43%, no infiltrative cardiac disease             - no sign of acute CHF  3. Hypertension: Blood pressure stable  4. Elevated TSH: Obtain free T4 and T3 as outpatient.   5. Borderline elevated D-dimer: Suspicion for DVT low.  [facility] will sign off.   Medication Recommendations:  Increase metoprolol succinate to 75mg  daily.  Other recommendations (labs, testing, etc):  Outpatient free T4 and T3 Follow up as an outpatient:  Within 1 week with cardiology service  For questions or updates, please contact [place] Please consult [email or web address] for contact  info under     Signed, Almyra Deforest, [place]  08/29/2019 2:30 PM   I have seen, examined the patient, and reviewed the above assessment and plan.  Changes to above are made where necessary.  On exam, RRR.  The patient has nonischemic CM, chronic systolic dysfunction, frequent ventricular ectopy and presyncope.  I have offered admission for ICD implantation.  She is very clear that she would prefer a more conservative approach. Her history is more suggestive of a vagal episode exacerbated by newly started entresto.  Adequate hydration is encouraged.  Increased toprol for NSVT, PVCs.  Close follow-up with Dr Jacalyn Lefevre team is advised as she wishes to be discharged at this time.  Co Sign: Thompson Grayer, MD 08/29/2019 3:04 PM

## 2019-08-29 NOTE — ED Notes (Signed)
Patient verbalizes understanding of discharge instructions. Opportunity for questioning and answers were provided. Armband removed by staff, pt discharged from ED.  

## 2019-08-29 NOTE — H&P (Deleted)
Cardiology Admission History and Physical:   Patient ID: Soriya Ashlin MRN: [id number]; DOB: [birth year]   Admission date: 08/29/2019  Primary Care Provider: Shirline Frees, MD Primary Cardiologist: Kirk Ruths, MD  Primary Electrophysiologist:  None   Chief Complaint: Presyncope  Patient Profile:   Eileen [name] is a 74 y.o. female with past medical history of nonischemic cardiomyopathy, hypertension, history of breast cancer s/p radiation therapy and colon cancer s/p chemotherapy presented with 2 episodes of presyncope this morning  History of Present Illness:   Eileen [name] is a 73 year old female with past medical history of nonischemic cardiomyopathy, hypertension, history of breast cancer s/p radiation therapy and colon cancer s/p chemotherapy.  Echocardiogram in October 2019 showed newly reduced LV EF of 25 to 30%, mild MR, biatrial enlargement.  Carotid Doppler in October 2019 showed mild disease bilaterally.  Subsequent cardiac catheterization showed normal coronary arteries.  24-hour Holter monitoring in November 2019 shows sinus rhythm, PACs, brief PAT, PVCs and occasional couplet.  PVC burden was 2.89% of total beats analyzed, supraventricular ectopy burden was 5.89% of total beats analyzed.  The overall burden was not felt to be significant to be responsible for the cardiomyopathy.  She did not tolerate carvedilol due to diarrhea, this was later switched to Toprol-XL and losartan.  Cardiac MRI obtained on 06/22/2019 showed LVEF 42%, small area of subepicardial LGE at the mid inferoseptal RV insertion site which is nonspecific, no infiltrative disease pattern was seen.  Patient was most recently seen virtually by Dr. Stanford Breed on 08/18/2019, losartan was discontinued and she was switched to Entresto 49-51 mg twice daily.  She has taken this medication for the past 2 days.  Early this morning around 1:45 AM, she got up to go to the bathroom.  She did not feel any dizziness getting up.   After she sat on the toilet to have a bowel movement, she started having dizziness and sweating episode.  She feels like she was going to pass out.  Afterward, she went back to sleep.  She woke up around 6 AM and trying to go to the bathroom to have another bowel movement.  The same symptom recurred with dizziness and sweating.  She eventually sought medical attention at [facility].  Initial lab work shows normal electrolyte and renal function.  Hemoglobin was normal.  BNP was mildly elevated at 265.6.  High-sensitivity troponin was negative at 7.  Lactic acid is normal.  D-dimer 0.56 which was mildly elevated.  TSH borderline elevated to 5.9.  Covid test negative.  Urinalysis test negative.  Cardiology service was consulted for presyncope.  While in the ED, she had a very frequent episode of PVCs, bigeminy, nonsustained VT up to 5 beats.  Heart Pathway Score:     Past Medical History:  Diagnosis Date  . Anemia   . Axillary mass 7/99   Left  . Barrett's esophagus   . Blood transfusion without reported diagnosis   . CARCINOMA, BREAST 1995   left  . Cervical spinal stenosis   . 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  . Headache(784.0)   . HEMORRHOIDS   . Hypertension   . Personal history of chemotherapy   . Personal history of radiation therapy     Past Surgical History:  Procedure Laterality Date  . BIOPSY  08/10/2019   Procedure: BIOPSY;  Surgeon: Mauri Pole, MD;  Location: [facility];  Service: Endoscopy;;  . BONE MARROW TRANSPLANT  1999  . BREAST LUMPECTOMY Left 11/95  left breast  . BUNIONECTOMY  3/09, 10/09   right and left  . COLON SURGERY    . COLONOSCOPY    . COLONOSCOPY WITH PROPOFOL [name] 08/10/2019   Procedure: COLONOSCOPY WITH PROPOFOL;  Surgeon: Mauri Pole, MD;  Location: [facility];  Service: Endoscopy;  Laterality: [name];  . ERCP  01/21/2012   Procedure: ENDOSCOPIC RETROGRADE CHOLANGIOPANCREATOGRAPHY (ERCP);  Surgeon: Lafayette Dragon, MD;  Location:  [facility];  Service: Endoscopy;  Laterality: [name];  . hemicolectomy  01/2005   right  . HYSTEROSCOPY  08/30/94  . INGUINAL HERNIA REPAIR    . LEFT HEART CATH AND CORONARY ANGIOGRAPHY [name] 05/21/2018   Procedure: LEFT HEART CATH AND CORONARY ANGIOGRAPHY;  Surgeon: Nelva Bush, MD;  Location: [place] [facility];  Service: Cardiovascular;  Laterality: [name];  . NECK MASS EXCISION Left 7/99  . ROTATOR CUFF REPAIR Right 09/08/13   full tear  . TOTAL ABDOMINAL HYSTERECTOMY W/ BILATERAL SALPINGOOPHORECTOMY  1996  . TUNNELED VENOUS CATHETER PLACEMENT  9/98     Medications Prior to Admission: Prior to Admission medications   Medication Sig Start Date End Date Taking? Authorizing Provider  acetaminophen (TYLENOL) 500 MG tablet Take 500 mg by mouth every 6 (six) hours as needed for moderate pain or headache.   Yes [provider]  b complex vitamins tablet Take 1 tablet by mouth 2 (two) times a week.    Yes [provider]  Calcium Carb-Cholecalciferol (CALCIUM 500+D PO) Take 1 tablet by mouth daily.   Yes [provider]  colestipol (COLESTID) 1 g tablet Take 1 g by mouth daily after lunch.   Yes [provider]  metoprolol succinate (TOPROL-XL) 50 MG 24 hr tablet Take 1 tablet (50 mg total) by mouth daily. Must keep scheduled appt for future refills 08/13/19  Yes Crenshaw, Denice Bors, MD  Multiple Minerals (CALCIUM-MAGNESIUM-ZINC) TABS Take 1 tablet by mouth 2 (two) times a week.    Yes [provider]  Multiple Vitamin (MULTIVITAMIN WITH MINERALS) TABS tablet Take 1 tablet by mouth daily.   Yes [provider]  omeprazole (PRILOSEC) 20 MG capsule Take 1 capsule (20 mg total) by mouth daily. 08/05/17  Yes Nandigam, Venia Minks, MD  sacubitril-valsartan (ENTRESTO) 49-51 MG Take 1 tablet by mouth 2 (two) times daily. Delene Loll [id number] free 30day card 08/26/19  Yes Lelon Perla, MD  vitamin  B-12 (CYANOCOBALAMIN) 500 MCG tablet Take 500 mcg by mouth See admin instructions. Take 500 mcg 5 days weekly, takes on non b complex days   Yes [provider]  vitamin C (ASCORBIC ACID) 500 MG tablet Take 500 mg by mouth 2 (two) times daily.    Yes [provider]  Vitamin D, Ergocalciferol, (DRISDOL) 50000 UNITS CAPS Take 50,000 Units by mouth every Wednesday.  08/28/11  Yes [provider]  vitamin E 400 UNIT capsule Take 400 Units by mouth daily.   Yes [provider]     Allergies:    Allergies  Allergen Reactions  . Levofloxacin Other (See Comments)    Red streaks to IV site when IV dose infusing and arm feeling very irritated, throbbing, painful  . Tape     Can use paper tape, adhesive causes redness    Social History:   Social History   Socioeconomic History  . Marital status: Married    Spouse name: Not on file  . Number of children: 2  . Years of education: Not on file  .  Highest education level: Not on file  Occupational History  . Not on file  Tobacco Use  . Smoking status: Former Smoker    Quit date: 12/20/1990    Years since quitting: 28.7  . Smokeless tobacco: Never Used  Substance and Sexual Activity  . Alcohol use: Yes    Alcohol/week: 2.0 standard drinks    Types: 2 Standard drinks or equivalent per week  . Drug use: No  . Sexual activity: Not Currently    Birth control/protection: Surgical    Comment: hysterectomy  Other Topics Concern  . Not on file  Social History Narrative  . Not on file   Social Determinants of Health   Financial Resource Strain:   . Difficulty of Paying Living Expenses: Not on file  Food Insecurity:   . Worried About Charity fundraiser in the Last Year: Not on file  . Ran Out of Food in the Last Year: Not on file  Transportation Needs:   . Lack of Transportation (Medical): Not on file  . Lack of Transportation (Non-Medical): Not on file  Physical Activity:   . Days of Exercise per Week:  Not on file  . Minutes of Exercise per Session: Not on file  Stress:   . Feeling of Stress : Not on file  Social Connections:   . Frequency of Communication with Friends and Family: Not on file  . Frequency of Social Gatherings with Friends and Family: Not on file  . Attends Religious Services: Not on file  . Active Member of Clubs or Organizations: Not on file  . Attends Archivist Meetings: Not on file  . Marital Status: Not on file  Intimate Partner Violence:   . Fear of Current or Ex-Partner: Not on file  . Emotionally Abused: Not on file  . Physically Abused: Not on file  . Sexually Abused: Not on file    Family History:   The patient's family history includes Heart attack in her father; Uterine cancer in her mother. There is no history of Colon cancer, Esophageal cancer, Stomach cancer, or Rectal cancer.    ROS:  Please see the history of present illness.  All other ROS reviewed and negative.     Physical Exam/Data:   Vitals:   08/29/19 1045 08/29/19 1115 08/29/19 1145 08/29/19 1200  BP: 128/71 125/77 114/80 130/90  Pulse: 82 (!) 56 61 (!) 57  Resp: 17 15 20 16   Temp:      TempSrc:      SpO2: 95% 96% 96% 96%  Weight:      Height:       No intake or output data in the 24 hours ending 08/29/19 1249 Last 3 Weights 08/29/2019 08/26/2019 08/04/2019  Weight (lbs) 145 lb 145 lb 145 lb  Weight (kg) 65.772 kg 65.772 kg 65.772 kg     Body mass index is 24.89 kg/m.  General:  Well nourished, well developed, in no acute distress HEENT: normal Lymph: no adenopathy Neck: no JVD Endocrine:  No thryomegaly Vascular: No carotid bruits; FA pulses 2+ bilaterally without bruits  Cardiac:  normal S1, S2; RRR; no murmur  Lungs:  clear to auscultation bilaterally, no wheezing, rhonchi or rales  Abd: soft, nontender, no hepatomegaly  Ext: no edema Musculoskeletal:  No deformities, BUE and BLE strength normal and equal Skin: warm and dry  Neuro:  CNs 2-12 intact, no focal  abnormalities noted Psych:  Normal affect    EKG:  The ECG that was done  in the ED and was personally reviewed and demonstrates normal sinus rhythm, frequent PVCs.  Relevant CV Studies:   Echo 05/13/2018 LV EF: 25% -  30%   Study Conclusions   - Left ventricle: The cavity size was normal. Wall thickness was  increased in a pattern of mild LVH. Systolic function was  severely reduced. The estimated ejection fraction was in the  range of 25% to 30%. Diffuse hypokinesis. Doppler parameters are  consistent with abnormal left ventricular relaxation (grade 1  diastolic dysfunction).  - Mitral valve: There was mild regurgitation.  - Left atrium: The atrium was mildly to moderately dilated.  - Right atrium: The atrium was mildly dilated.    Cardiac MRI 06/22/2019 Measurements:  LVEDV 179 mL LVSV 75 mL LVEF 42%  RVEDV 96 mL RVSV 63 mL RVEF 65%  IMPRESSION: 1.  Normal LV size with EF 42%, diffuse hypokinesis.  2.  Normal RV size and systolic function, EF 123456.  3. There was a small area of subepicardial LGE at the mid inferoseptal RV insertion site. This is nonspecific and can be seen with pressure/volume overload.  Laboratory Data:  High Sensitivity Troponin:   Recent Labs  Lab 08/29/19 0851  TROPONINIHS 7      Chemistry Recent Labs  Lab 08/29/19 0851 08/29/19 0920  NA 140 142  K 4.1 3.9  CL 106 108  CO2 23  --   GLUCOSE 105* 102*  BUN 19 19  CREATININE 0.76 0.60  CALCIUM 9.1  --   GFRNONAA >60  --   GFRAA >60  --   ANIONGAP 11  --     Recent Labs  Lab 08/29/19 0851  PROT 6.4*  ALBUMIN 3.8  AST 23  ALT 26  ALKPHOS 58  BILITOT 0.8   Hematology Recent Labs  Lab 08/29/19 0851 08/29/19 0920  WBC 8.9  --   RBC 5.16*  --   HGB 14.6 15.0  HCT 45.0 44.0  MCV 87.2  --   MCH 28.3  --   MCHC 32.4  --   RDW 12.8  --   PLT 232  --    BNP Recent Labs  Lab 08/29/19 0851  BNP 265.6*    DDimer  Recent Labs  Lab 08/29/19 0851   DDIMER 0.56*     Radiology/Studies:  No results found. {  Assessment and Plan:   1. Presyncope: Both episode occurred this morning while patient sitting on the toilet having a bowel movement.  It is very possible she had presyncope related to overstimulation of the vagal tone.  However while in the ED, her heart rate would increase quite significantly when she change from a lying position to a sitting position.  She also has very frequent PVCs as well, and nonsustained VT up to 5 beats.  Cannot rule out rhythm issue or orthostatic issue.  Will discuss with MD, plan to admit overnight for observation  -We will need orthostatic vital signs when she is able to stand.  Consider repeat echocardiogram  2. History of nonischemic cardiomyopathy: Diagnosed in 2019.  EF was 25 to 30% the time.  Previous 24-hour Holter monitoring in 2019 had 2.89%  PVC burden and 5.89% of supraventricular ectopy burden.  At the time, the degree of PVC burden was not significant enough to contribute to cardiomyopathy.  While in the emergency room, she has a very frequent bigeminy, PVCs, nonsustained VT, may need to reconsider possibility of PVC induced cardiomyopathy.  Recent cardiac MRI suggested EF  is 43%, no infiltrative cardiac disease  -Otherwise she appears to be euvolemic on physical exam.  3. Hypertension: Blood pressure stable  4. Elevated TSH: Obtain free T4 and T3  5. Borderline elevated D-dimer: Suspicion for DVT low.   Severity of Illness: The appropriate patient status for this patient is OBSERVATION. Observation status is judged to be reasonable and necessary in order to provide the required intensity of service to ensure the patient's safety. The patient's presenting symptoms, physical exam findings, and initial radiographic and laboratory data in the context of their medical condition is felt to place them at decreased risk for further clinical deterioration. Furthermore, it is anticipated that the  patient will be medically stable for discharge from the hospital within 2 midnights of admission. The following factors support the patient status of observation.   " The patient's presenting symptoms include presyncope. " The physical exam findings include benign. " The initial radiographic and laboratory data are .     For questions or updates, please contact Roy Please consult [email or web address] for contact info under        Hilbert Corrigan, [place]  08/29/2019 12:49 PM

## 2019-08-29 NOTE — ED Triage Notes (Signed)
Pt brought from home via EMS with c/o dizziness and 2 near syncopal episodes this AM. Pt reports she started new BP medication 2 days ago and has been experiencing symptoms since then. 0 falls reported. Pt currently A&Ox4.

## 2019-08-30 ENCOUNTER — Telehealth: Payer: Self-pay | Admitting: Cardiology

## 2019-08-30 MED ORDER — LOSARTAN POTASSIUM 100 MG PO TABS: 100.0000 mg | ORAL_TABLET | Freq: Every day | ORAL | 3 refills | Status: DC

## 2019-08-30 MED FILL — LOSARTAN POTASSIUM 100 MG T: 100 | 90 days supply | Qty: 90 | Fill #0

## 2019-08-30 MED FILL — METOPROLOL SUCCINATE ER 25: 25 | 30 days supply | Qty: 30 | Fill #0

## 2019-08-30 NOTE — Telephone Encounter (Signed)
Okay to discontinue Entresto and resume losartan 100 mg daily.  Follow blood pressure.  If dyspnea persists we will arrange PA office visit. Kirk Ruths

## 2019-08-30 NOTE — Telephone Encounter (Signed)
Spoke with pt who states she was advised to start on Entresto last week. She states she started taking Entresto last Fri and had also taken it last Saturday. Pt states she was 'sick at night twice and almost passed out' and that it was 'very hard to breathe'. Pt had presyncope and dyspnea around 0200 and 0600 early Sunday morning and she states when she was picked up by EMS her BP was checked with SBP reading of 220. Pt states during discharge from ED she was instructed to take Toprol XL 75 mg daily so Rx  For additional 25 mg tablets were sent to pharmacy. She has not picked this Rx up yet, but she has taken her usual 50 mg of Toprol XL today. She states she checked vitals before contacting office and BP 127/91 and HR 91. Pt noticeably SOB during call. She states she has been SOB in the morning lately and has needed to deep breathe to compensate. Pt states she is very afraid to take her Delene Loll d/t her previous symptoms. She would like Dr. Stanford Breed to advise.

## 2019-08-30 NOTE — Telephone Encounter (Signed)
Spoke with pt, Aware of dr crenshaw's recommendations. New script sent to the pharmacy  

## 2019-08-30 NOTE — Telephone Encounter (Signed)
New Message  Pt informed me she was in the ER last night taken by EMS and wanted to talk with her nurse regarding medication.

## 2019-08-31 NOTE — Progress Notes (Signed)
HPI: Follow-up cardiomyopathy.Patientpreviously seen for palpitations. Echocardiogram 10/19showed newly reduced LV function with ejection fraction 25 to 30%, mild mitral regurgitation and biatrial enlargement. Carotid Dopplers October 2019 showed 1 to 39% bilateral stenosis.Cardiac catheterization October 2019 showed no coronary disease. Holter monitor November 2019 showed sinus rhythm with PACs, brief PAT, PVCs, occasional couplet and 3 beats nonsustained ventricular tachycardia. Cardiac MRI November 2020 showed ejection fraction 42% with no evidence of infiltrative cardiomyopathy. On January 28 we changed patient's losartan to [facility]. She then went to the emergency room because of near syncope. Her blood pressure was found to be 130/90.  She was noted to have frequent PVCs and 5 beats NSVT in the emergency room. She was seen by Dr. Rayann Heman and episodes felt potentially to be vagally mediated with possible contribution from orthostasis. However given her ectopy he did apparently offer admission for ICD implantation. TSH also noted to be elevated (5.904). Since last seenshe states her systolic blood pressure is running 160-170.  She has some fatigue.  She denies dyspnea, chest pain, palpitations or syncope.  Current Outpatient Medications  Medication Sig Dispense Refill  . acetaminophen (TYLENOL) 500 MG tablet Take 500 mg by mouth every 6 (six) hours as needed for moderate pain or headache.    Marland Kitchen amLODipine (NORVASC) 5 MG tablet Take 1 tablet (5 mg total) by mouth daily. 180 tablet 3  . b complex vitamins tablet Take 1 tablet by mouth 2 (two) times a week.     . Calcium Carb-Cholecalciferol (CALCIUM 500+D PO) Take 1 tablet by mouth daily.    . colestipol (COLESTID) 1 g tablet Take 1 g by mouth daily after lunch.    . losartan (COZAAR) 100 MG tablet Take 1 tablet (100 mg total) by mouth daily. 90 tablet 3  . metoprolol succinate (TOPROL-XL) 50 MG 24 hr tablet Take 1 tablet (50 mg total) by  mouth daily. Must keep scheduled appt for future refills 30 tablet 0  . Multiple Minerals (CALCIUM-MAGNESIUM-ZINC) TABS Take 1 tablet by mouth 2 (two) times a week.     . Multiple Vitamin (MULTIVITAMIN WITH MINERALS) TABS tablet Take 1 tablet by mouth daily.    Marland Kitchen omeprazole (PRILOSEC) 20 MG capsule Take 1 capsule (20 mg total) by mouth daily. 90 capsule 3  . vitamin B-12 (CYANOCOBALAMIN) 500 MCG tablet Take 500 mcg by mouth See admin instructions. Take 500 mcg 5 days weekly, takes on non b complex days    . vitamin C (ASCORBIC ACID) 500 MG tablet Take 500 mg by mouth 2 (two) times daily.     . Vitamin D, Ergocalciferol, (DRISDOL) 50000 UNITS CAPS Take 50,000 Units by mouth every Wednesday.     . vitamin E 400 UNIT capsule Take 400 Units by mouth daily.     No current facility-administered medications for this visit.     Past Medical History:  Diagnosis Date  . Anemia   . Axillary mass 7/99   Left  . Barrett's esophagus   . Blood transfusion without reported diagnosis   . CARCINOMA, BREAST 1995   left  . Cervical spinal stenosis   . 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  . Headache(784.0)   . HEMORRHOIDS   . Hypertension   . Personal history of chemotherapy   . Personal history of radiation therapy     Past Surgical History:  Procedure Laterality Date  . BIOPSY  08/10/2019   Procedure: BIOPSY;  Surgeon: Mauri Pole, MD;  Location: [facility];  Service: Endoscopy;;  . BONE MARROW TRANSPLANT  1999  . BREAST LUMPECTOMY Left 11/95   left breast  . BUNIONECTOMY  3/09, 10/09   right and left  . COLON SURGERY    . COLONOSCOPY    . COLONOSCOPY WITH PROPOFOL [name] 08/10/2019   Procedure: COLONOSCOPY WITH PROPOFOL;  Surgeon: Mauri Pole, MD;  Location: [facility];  Service: Endoscopy;  Laterality: [name];  . ERCP  01/21/2012   Procedure: ENDOSCOPIC RETROGRADE CHOLANGIOPANCREATOGRAPHY (ERCP);  Surgeon: Lafayette Dragon, MD;  Location: [facility];  Service: Endoscopy;   Laterality: [name];  . hemicolectomy  01/2005   right  . HYSTEROSCOPY  08/30/94  . INGUINAL HERNIA REPAIR    . LEFT HEART CATH AND CORONARY ANGIOGRAPHY [name] 05/21/2018   Procedure: LEFT HEART CATH AND CORONARY ANGIOGRAPHY;  Surgeon: Nelva Bush, MD;  Location: [place] [facility];  Service: Cardiovascular;  Laterality: [name];  . NECK MASS EXCISION Left 7/99  . ROTATOR CUFF REPAIR Right 09/08/13   full tear  . TOTAL ABDOMINAL HYSTERECTOMY W/ BILATERAL SALPINGOOPHORECTOMY  1996  . TUNNELED VENOUS CATHETER PLACEMENT  9/98    Social History   Socioeconomic History  . Marital status: Married    Spouse name: Not on file  . Number of children: 2  . Years of education: Not on file  . Highest education level: Not on file  Occupational History  . Not on file  Tobacco Use  . Smoking status: Former Smoker    Quit date: 12/20/1990    Years since quitting: 28.7  . Smokeless tobacco: Never Used  Substance and Sexual Activity  . Alcohol use: Yes    Alcohol/week: 2.0 standard drinks    Types: 2 Standard drinks or equivalent per week  . Drug use: No  . Sexual activity: Not Currently    Birth control/protection: Surgical    Comment: hysterectomy  Other Topics Concern  . Not on file  Social History Narrative  . Not on file   Social Determinants of Health   Financial Resource Strain:   . Difficulty of Paying Living Expenses: Not on file  Food Insecurity:   . Worried About Charity fundraiser in the Last Year: Not on file  . Ran Out of Food in the Last Year: Not on file  Transportation Needs:   . Lack of Transportation (Medical): Not on file  . Lack of Transportation (Non-Medical): Not on file  Physical Activity:   . Days of Exercise per Week: Not on file  . Minutes of Exercise per Session: Not on file  Stress:   . Feeling of Stress : Not on file  Social Connections:   . Frequency of Communication with Friends and Family: Not on file  . Frequency of Social Gatherings with Friends and  Family: Not on file  . Attends Religious Services: Not on file  . Active Member of Clubs or Organizations: Not on file  . Attends Archivist Meetings: Not on file  . Marital Status: Not on file  Intimate Partner Violence:   . Fear of Current or Ex-Partner: Not on file  . Emotionally Abused: Not on file  . Physically Abused: Not on file  . Sexually Abused: Not on file    Family History  Problem Relation Age of Onset  . Uterine cancer Mother   . Heart attack Father   . Colon cancer Neg Hx   . Esophageal cancer Neg Hx   . Stomach cancer Neg Hx   .  Rectal cancer Neg Hx     ROS: no fevers or chills, productive cough, hemoptysis, dysphasia, odynophagia, melena, hematochezia, dysuria, hematuria, rash, seizure activity, orthopnea, PND, pedal edema, claudication. Remaining systems are negative.  Physical Exam: Well-developed well-nourished in no acute distress.  Skin is warm and dry.  HEENT is normal.  Neck is supple.  Chest is clear to auscultation with normal expansion.  Cardiovascular exam is regular rate and rhythm.  Abdominal exam nontender or distended. No masses palpated. Extremities show no edema. neuro grossly intact  A/P  1 nonischemic cardiomyopathy-etiology unclear.  Previous catheterization showed no coronary disease.  Previous monitor showed ectopy but not enough to cause cardiomyopathy.  No history of alcohol abuse or uncontrolled hypertension.  We have been treating medically as follow-up MRI showed ejection fraction 42%.  Continue losartan.  She feels as though she had an allergic reaction to [facility] and does not want to retry.  Continue Toprol but will increase to 100 mg daily.  Continue to titrate medications as tolerated by pulse and blood pressure.  Once fully titrated we will plan to repeat echocardiogram 3 months later.  2 PVCs/nonsustained ventricular tachycardia-continue beta-blocker.  3 hypertension-blood pressure controlled.  Increase Toprol to  100 mg daily.  Follow blood pressure at home and we will have her see APP in 4 weeks.  Would likely increase amlodipine to 10 mg at that time if blood pressure remains elevated.  Could also consider changing Toprol to carvedilol for better blood pressure effects.  Kirk Ruths, MD

## 2019-09-02 ENCOUNTER — Telehealth: Payer: Self-pay | Admitting: Cardiology

## 2019-09-02 DIAGNOSIS — Z23 Encounter for immunization: Secondary | ICD-10-CM | POA: Diagnosis not present

## 2019-09-02 NOTE — Telephone Encounter (Signed)
Spoke with pt who report since starting losartan, her BP continues to be elevated. She report last night BP 150/106 HR 90. She denies headache or feeling light headed but report feeling nauseous.   Will forward to MD to make aware

## 2019-09-02 NOTE — Telephone Encounter (Signed)
New Message     Pt c/o BP issue: STAT if pt c/o blurred vision, one-sided weakness or slurred speech  1. What are your last 5 BP readings? 156/106 pulse 90  160/110   2. Are you having any other symptoms (ex. Dizziness, headache, blurred vision, passed out)? No   3. What is your BP issue? Pt is calling and says she is very worried, she says her BP is still too high and she is taking her medication  Pt says she took [facility] Friday and Saturday and she went to the [facility] Sunday  She says she felt like passing out on Saturday after taking, she says she has stopped taking    Please call

## 2019-09-03 MED ORDER — AMLODIPINE BESYLATE 5 MG PO TABS: 5.0000 mg | ORAL_TABLET | Freq: Every day | ORAL | 3 refills | Status: DC

## 2019-09-03 MED FILL — AMLODIPINE BESYLATE 5 MG TA: 5 | 90 days supply | Qty: 90 | Fill #0

## 2019-09-03 NOTE — Telephone Encounter (Signed)
Pt updated and verbalized understanding. New orders placed.  

## 2019-09-03 NOTE — Telephone Encounter (Signed)
Add amlodipine 5 mg daily and follow BP [name]  

## 2019-09-07 ENCOUNTER — Ambulatory Visit (INDEPENDENT_AMBULATORY_CARE_PROVIDER_SITE_OTHER): Payer: Medicare Other | Admitting: Cardiology

## 2019-09-07 ENCOUNTER — Encounter: Payer: Self-pay | Admitting: Cardiology

## 2019-09-07 ENCOUNTER — Other Ambulatory Visit: Payer: Self-pay

## 2019-09-07 VITALS — BP 164/86 | HR 80 | Ht 64.0 in | Wt 144.8 lb

## 2019-09-07 DIAGNOSIS — I429 Cardiomyopathy, unspecified: Secondary | ICD-10-CM | POA: Diagnosis not present

## 2019-09-07 DIAGNOSIS — I10 Essential (primary) hypertension: Secondary | ICD-10-CM

## 2019-09-07 DIAGNOSIS — I493 Ventricular premature depolarization: Secondary | ICD-10-CM | POA: Diagnosis not present

## 2019-09-07 MED ORDER — METOPROLOL SUCCINATE ER 100 MG PO TB24: 100.0000 mg | ORAL_TABLET | Freq: Every day | ORAL | 3 refills | Status: DC

## 2019-09-07 MED FILL — METOPROLOL SUCCINATE ER 100: 100 | 90 days supply | Qty: 90 | Fill #0

## 2019-09-07 NOTE — Patient Instructions (Signed)
Medication Instructions:  INCREASE METOPROLOL SUCC ER TO 100 MG ONCE DAILY  *If you need a refill on your cardiac medications before your next appointment, please call your pharmacy*  Lab Work: If you have labs (blood work) drawn today and your tests are completely normal, you will receive your results only by: Marland Kitchen MyChart Message (if you have MyChart) OR . A paper copy in the mail If you have any lab test that is abnormal or we need to change your treatment, we will call you to review the results.  Follow-Up: At [facility], you and your health needs are our priority.  As part of our continuing mission to provide you with exceptional heart care, we have created designated Provider Care Teams.  These Care Teams include your primary Cardiologist (physician) and Advanced Practice Providers (APPs -  Physician Assistants and Nurse Practitioners) who all work together to provide you with the care you need, when you need it.  Your next appointment:   4-6 week(s)  The format for your next appointment:   In Person  Provider:   APP AND 3 MONTHS WITH DR Stanford Breed

## 2019-09-14 DIAGNOSIS — I8312 Varicose veins of left lower extremity with inflammation: Secondary | ICD-10-CM | POA: Diagnosis not present

## 2019-09-23 DIAGNOSIS — Z23 Encounter for immunization: Secondary | ICD-10-CM | POA: Diagnosis not present

## 2019-09-28 DIAGNOSIS — Q72812 Congenital shortening of left lower limb: Secondary | ICD-10-CM | POA: Diagnosis not present

## 2019-09-28 DIAGNOSIS — M9905 Segmental and somatic dysfunction of pelvic region: Secondary | ICD-10-CM | POA: Diagnosis not present

## 2019-09-28 DIAGNOSIS — M9903 Segmental and somatic dysfunction of lumbar region: Secondary | ICD-10-CM | POA: Diagnosis not present

## 2019-09-28 DIAGNOSIS — M5388 Other specified dorsopathies, sacral and sacrococcygeal region: Secondary | ICD-10-CM | POA: Diagnosis not present

## 2019-09-29 DIAGNOSIS — I8312 Varicose veins of left lower extremity with inflammation: Secondary | ICD-10-CM | POA: Diagnosis not present

## 2019-09-30 DIAGNOSIS — Q72812 Congenital shortening of left lower limb: Secondary | ICD-10-CM | POA: Diagnosis not present

## 2019-09-30 DIAGNOSIS — M5388 Other specified dorsopathies, sacral and sacrococcygeal region: Secondary | ICD-10-CM | POA: Diagnosis not present

## 2019-09-30 DIAGNOSIS — M9903 Segmental and somatic dysfunction of lumbar region: Secondary | ICD-10-CM | POA: Diagnosis not present

## 2019-09-30 DIAGNOSIS — M9905 Segmental and somatic dysfunction of pelvic region: Secondary | ICD-10-CM | POA: Diagnosis not present

## 2019-10-04 ENCOUNTER — Telehealth: Payer: Self-pay | Admitting: *Deleted

## 2019-10-04 DIAGNOSIS — Q72812 Congenital shortening of left lower limb: Secondary | ICD-10-CM | POA: Diagnosis not present

## 2019-10-04 DIAGNOSIS — M5388 Other specified dorsopathies, sacral and sacrococcygeal region: Secondary | ICD-10-CM | POA: Diagnosis not present

## 2019-10-04 DIAGNOSIS — M9905 Segmental and somatic dysfunction of pelvic region: Secondary | ICD-10-CM | POA: Diagnosis not present

## 2019-10-04 DIAGNOSIS — M9903 Segmental and somatic dysfunction of lumbar region: Secondary | ICD-10-CM | POA: Diagnosis not present

## 2019-10-04 NOTE — Telephone Encounter (Signed)
Patient is in [place] hold for 6 mo f/u MRI abdomen w/wo contrast. 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   Spoke with patient. Patient is scheduled for MRI at [facility] on 10/13/19. Advised patient once results are back and reviewed by Dr. Sabra Heck, our office will f/u to review. Patient verbalizes understanding and is agreeable.   Routing to provider for final review. Patient is agreeable to disposition. Will close encounter.  Cc: Magdalene Patricia, Hayley Carder

## 2019-10-06 DIAGNOSIS — Q72812 Congenital shortening of left lower limb: Secondary | ICD-10-CM | POA: Diagnosis not present

## 2019-10-06 DIAGNOSIS — M5388 Other specified dorsopathies, sacral and sacrococcygeal region: Secondary | ICD-10-CM | POA: Diagnosis not present

## 2019-10-06 DIAGNOSIS — M9903 Segmental and somatic dysfunction of lumbar region: Secondary | ICD-10-CM | POA: Diagnosis not present

## 2019-10-06 DIAGNOSIS — M9905 Segmental and somatic dysfunction of pelvic region: Secondary | ICD-10-CM | POA: Diagnosis not present

## 2019-10-07 DIAGNOSIS — H353131 Nonexudative age-related macular degeneration, bilateral, early dry stage: Secondary | ICD-10-CM | POA: Diagnosis not present

## 2019-10-07 DIAGNOSIS — H16143 Punctate keratitis, bilateral: Secondary | ICD-10-CM | POA: Diagnosis not present

## 2019-10-07 DIAGNOSIS — H2513 Age-related nuclear cataract, bilateral: Secondary | ICD-10-CM | POA: Diagnosis not present

## 2019-10-07 DIAGNOSIS — H04123 Dry eye syndrome of bilateral lacrimal glands: Secondary | ICD-10-CM | POA: Diagnosis not present

## 2019-10-11 DIAGNOSIS — M5388 Other specified dorsopathies, sacral and sacrococcygeal region: Secondary | ICD-10-CM | POA: Diagnosis not present

## 2019-10-11 DIAGNOSIS — M5432 Sciatica, left side: Secondary | ICD-10-CM | POA: Diagnosis not present

## 2019-10-11 DIAGNOSIS — M545 Low back pain: Secondary | ICD-10-CM | POA: Diagnosis not present

## 2019-10-11 DIAGNOSIS — M9903 Segmental and somatic dysfunction of lumbar region: Secondary | ICD-10-CM | POA: Diagnosis not present

## 2019-10-11 DIAGNOSIS — M9905 Segmental and somatic dysfunction of pelvic region: Secondary | ICD-10-CM | POA: Diagnosis not present

## 2019-10-11 DIAGNOSIS — Q72812 Congenital shortening of left lower limb: Secondary | ICD-10-CM | POA: Diagnosis not present

## 2019-10-13 DIAGNOSIS — M9905 Segmental and somatic dysfunction of pelvic region: Secondary | ICD-10-CM | POA: Diagnosis not present

## 2019-10-13 DIAGNOSIS — D7389 Other diseases of spleen: Secondary | ICD-10-CM | POA: Diagnosis not present

## 2019-10-13 DIAGNOSIS — M9903 Segmental and somatic dysfunction of lumbar region: Secondary | ICD-10-CM | POA: Diagnosis not present

## 2019-10-13 DIAGNOSIS — M5388 Other specified dorsopathies, sacral and sacrococcygeal region: Secondary | ICD-10-CM | POA: Diagnosis not present

## 2019-10-13 DIAGNOSIS — N261 Atrophy of kidney (terminal): Secondary | ICD-10-CM | POA: Diagnosis not present

## 2019-10-13 DIAGNOSIS — Q72812 Congenital shortening of left lower limb: Secondary | ICD-10-CM | POA: Diagnosis not present

## 2019-10-14 DIAGNOSIS — G8929 Other chronic pain: Secondary | ICD-10-CM | POA: Diagnosis not present

## 2019-10-14 DIAGNOSIS — I10 Essential (primary) hypertension: Secondary | ICD-10-CM | POA: Diagnosis not present

## 2019-10-14 DIAGNOSIS — M5442 Lumbago with sciatica, left side: Secondary | ICD-10-CM | POA: Diagnosis not present

## 2019-10-14 DIAGNOSIS — I428 Other cardiomyopathies: Secondary | ICD-10-CM | POA: Diagnosis not present

## 2019-10-14 MED FILL — AMLODIPINE BESYLATE 10 MG T: 10 | 90 days supply | Qty: 90 | Fill #0

## 2019-10-18 DIAGNOSIS — M5388 Other specified dorsopathies, sacral and sacrococcygeal region: Secondary | ICD-10-CM | POA: Diagnosis not present

## 2019-10-18 DIAGNOSIS — M9905 Segmental and somatic dysfunction of pelvic region: Secondary | ICD-10-CM | POA: Diagnosis not present

## 2019-10-18 DIAGNOSIS — M9903 Segmental and somatic dysfunction of lumbar region: Secondary | ICD-10-CM | POA: Diagnosis not present

## 2019-10-18 DIAGNOSIS — Q72812 Congenital shortening of left lower limb: Secondary | ICD-10-CM | POA: Diagnosis not present

## 2019-10-20 DIAGNOSIS — I8312 Varicose veins of left lower extremity with inflammation: Secondary | ICD-10-CM | POA: Diagnosis not present

## 2019-10-20 DIAGNOSIS — Q72812 Congenital shortening of left lower limb: Secondary | ICD-10-CM | POA: Diagnosis not present

## 2019-10-20 DIAGNOSIS — M9903 Segmental and somatic dysfunction of lumbar region: Secondary | ICD-10-CM | POA: Diagnosis not present

## 2019-10-20 DIAGNOSIS — M9905 Segmental and somatic dysfunction of pelvic region: Secondary | ICD-10-CM | POA: Diagnosis not present

## 2019-10-20 DIAGNOSIS — M5388 Other specified dorsopathies, sacral and sacrococcygeal region: Secondary | ICD-10-CM | POA: Diagnosis not present

## 2019-10-25 DIAGNOSIS — I8312 Varicose veins of left lower extremity with inflammation: Secondary | ICD-10-CM | POA: Diagnosis not present

## 2019-10-25 DIAGNOSIS — M9905 Segmental and somatic dysfunction of pelvic region: Secondary | ICD-10-CM | POA: Diagnosis not present

## 2019-10-25 DIAGNOSIS — Q72812 Congenital shortening of left lower limb: Secondary | ICD-10-CM | POA: Diagnosis not present

## 2019-10-25 DIAGNOSIS — M9903 Segmental and somatic dysfunction of lumbar region: Secondary | ICD-10-CM | POA: Diagnosis not present

## 2019-10-25 DIAGNOSIS — M5388 Other specified dorsopathies, sacral and sacrococcygeal region: Secondary | ICD-10-CM | POA: Diagnosis not present

## 2019-10-26 ENCOUNTER — Other Ambulatory Visit: Payer: Self-pay

## 2019-10-26 ENCOUNTER — Inpatient Hospital Stay: Payer: Medicare Other

## 2019-10-26 ENCOUNTER — Inpatient Hospital Stay: Payer: Medicare Other | Attending: Oncology | Admitting: Oncology

## 2019-10-26 VITALS — BP 159/84 | HR 61 | Temp 97.8°F | Resp 16 | Ht 64.0 in | Wt 146.1 lb

## 2019-10-26 DIAGNOSIS — Z800 Family history of malignant neoplasm of digestive organs: Secondary | ICD-10-CM | POA: Diagnosis not present

## 2019-10-26 DIAGNOSIS — Z79899 Other long term (current) drug therapy: Secondary | ICD-10-CM | POA: Diagnosis not present

## 2019-10-26 DIAGNOSIS — C189 Malignant neoplasm of colon, unspecified: Secondary | ICD-10-CM

## 2019-10-26 DIAGNOSIS — I429 Cardiomyopathy, unspecified: Secondary | ICD-10-CM | POA: Insufficient documentation

## 2019-10-26 DIAGNOSIS — I10 Essential (primary) hypertension: Secondary | ICD-10-CM | POA: Diagnosis not present

## 2019-10-26 DIAGNOSIS — Z853 Personal history of malignant neoplasm of breast: Secondary | ICD-10-CM | POA: Diagnosis not present

## 2019-10-26 DIAGNOSIS — Z803 Family history of malignant neoplasm of breast: Secondary | ICD-10-CM | POA: Insufficient documentation

## 2019-10-26 DIAGNOSIS — M810 Age-related osteoporosis without current pathological fracture: Secondary | ICD-10-CM | POA: Insufficient documentation

## 2019-10-26 LAB — CBC WITH DIFFERENTIAL (CANCER CENTER ONLY)
Abs Immature Granulocytes: 0.02 10*3/uL (ref 0.00–0.07)
Basophils Absolute: 0 10*3/uL (ref 0.0–0.1)
Basophils Relative: 1 %
Eosinophils Absolute: 0.1 10*3/uL (ref 0.0–0.5)
Eosinophils Relative: 1 %
HCT: 39.6 % (ref 36.0–46.0)
Hemoglobin: 12.5 g/dL (ref 12.0–15.0)
Immature Granulocytes: 0 %
Lymphocytes Relative: 22 %
Lymphs Abs: 1.7 10*3/uL (ref 0.7–4.0)
MCH: 28.2 pg (ref 26.0–34.0)
MCHC: 31.6 g/dL (ref 30.0–36.0)
MCV: 89.4 fL (ref 80.0–100.0)
Monocytes Absolute: 0.6 10*3/uL (ref 0.1–1.0)
Monocytes Relative: 7 %
Neutro Abs: 5.4 10*3/uL (ref 1.7–7.7)
Neutrophils Relative %: 69 %
Platelet Count: 226 10*3/uL (ref 150–400)
RBC: 4.43 MIL/uL (ref 3.87–5.11)
RDW: 13.1 % (ref 11.5–15.5)
WBC Count: 7.7 10*3/uL (ref 4.0–10.5)
nRBC: 0 % (ref 0.0–0.2)

## 2019-10-26 LAB — CMP (CANCER CENTER ONLY)
ALT: 26 U/L (ref 0–44)
AST: 23 U/L (ref 15–41)
Albumin: 3.8 g/dL (ref 3.5–5.0)
Alkaline Phosphatase: 66 U/L (ref 38–126)
Anion gap: 7 (ref 5–15)
BUN: 20 mg/dL (ref 8–23)
CO2: 27 mmol/L (ref 22–32)
Calcium: 9.3 mg/dL (ref 8.9–10.3)
Chloride: 107 mmol/L (ref 98–111)
Creatinine: 0.84 mg/dL (ref 0.44–1.00)
GFR, Est AFR Am: >60 mL/min (ref >60)
GFR, Estimated: >60 mL/min (ref >60)
Glucose, Bld: 92 mg/dL (ref 70–99)
Potassium: 4.8 mmol/L (ref 3.5–5.1)
Sodium: 141 mmol/L (ref 135–145)
Total Bilirubin: 0.5 mg/dL (ref 0.3–1.2)
Total Protein: 6.5 g/dL (ref 6.5–8.1)

## 2019-10-26 NOTE — Progress Notes (Signed)
Hematology and Oncology Follow Up Visit  Astara Marocco [id number] July 17, 1946 74 y.o. 10/26/2019 12:10 PM Shirline Frees, MDHarris, [name], MD   Principle Diagnosis:  74 year old woman with:  1. Breast cancer diagnosed in 1995 and received standard therapy as well as high-dose chemotherapy with stem cell transplant and currently in remission since 1999.   2.  Colon cancer diagnosed in 2006.  She was found to have stage IIa disease and currently in remission.  Prior Therapy: 1. She is status post lumpectomy and lymph node dissection followed by CM[facility] and radiation therapy. 2. She is status post 4 cycles of Adriamycin and Taxotere followed by stem cell transplantation at [facility] on 08/16/1997 due to recurrence and the left supraclavicular lymph node. Her tumor is ER negative PR positive. 3. She is status post right colectomy for stage IIA adenocarcinoma of the colon in October of 2006. She is status post 10 cycles of FOLFOX concluded in 06/2005 4. Femara taken between 2001 and 2017.   Current therapy: Active surveillance.  Interim History:  [name] is here for a follow-up visit.  Since her last visit, she reports no major changes in her health.  She was seen in the emergency department for cardiac issues including hypertension dizziness.  She does have cardiomyopathy related to previous treatment for her breast cancer.  He denies any shortness of breath, chest pain or palpitation at this time.  Performance status and quality of life remained reasonable.   Medications: Updated on review. Current Outpatient Medications  Medication Sig Dispense Refill  . acetaminophen (TYLENOL) 500 MG tablet Take 500 mg by mouth every 6 (six) hours as needed for moderate pain or headache.    Marland Kitchen amLODipine (NORVASC) 5 MG tablet Take 1 tablet (5 mg total) by mouth daily. 180 tablet 3  . b complex vitamins tablet Take 1 tablet by mouth 2 (two) times a week.     . Calcium Carb-Cholecalciferol (CALCIUM 500+D  PO) Take 1 tablet by mouth daily.    . colestipol (COLESTID) 1 g tablet Take 1 g by mouth daily after lunch.    . losartan (COZAAR) 100 MG tablet Take 1 tablet (100 mg total) by mouth daily. 90 tablet 3  . metoprolol succinate (TOPROL-XL) 100 MG 24 hr tablet Take 1 tablet (100 mg total) by mouth daily. Must keep scheduled appt for future refills 90 tablet 3  . Multiple Minerals (CALCIUM-MAGNESIUM-ZINC) TABS Take 1 tablet by mouth 2 (two) times a week.     . Multiple Vitamin (MULTIVITAMIN WITH MINERALS) TABS tablet Take 1 tablet by mouth daily.    Marland Kitchen omeprazole (PRILOSEC) 20 MG capsule Take 1 capsule (20 mg total) by mouth daily. 90 capsule 3  . vitamin B-12 (CYANOCOBALAMIN) 500 MCG tablet Take 500 mcg by mouth See admin instructions. Take 500 mcg 5 days weekly, takes on non b complex days    . vitamin C (ASCORBIC ACID) 500 MG tablet Take 500 mg by mouth 2 (two) times daily.     . Vitamin D, Ergocalciferol, (DRISDOL) 50000 UNITS CAPS Take 50,000 Units by mouth every Wednesday.     . vitamin E 400 UNIT capsule Take 400 Units by mouth daily.     No current facility-administered medications for this visit.     Allergies:  Allergies  Allergen Reactions  . Levofloxacin Other (See Comments)    Red streaks to IV site when IV dose infusing and arm feeling very irritated, throbbing, painful  . Tape     Can  use paper tape, adhesive causes redness       Physical Exam:  Blood pressure (!) 159/84, pulse 61, temperature 97.8 F (36.6 C), temperature source Temporal, resp. rate 16, height 5\' 4"  (1.626 m), weight 146 lb 1.6 oz (66.3 kg), last menstrual period 07/30/1995, SpO2 100 %.   ECOG: 0   General appearance: Alert, awake without any distress. Head: Atraumatic without abnormalities Oropharynx: Without any thrush or ulcers. Eyes: No scleral icterus. Lymph nodes: No lymphadenopathy noted in the cervical, supraclavicular, or axillary nodes Heart:regular rate and rhythm, without any murmurs  or gallops.   Lung: Clear to auscultation without any rhonchi, wheezes or dullness to percussion. Abdomin: Soft, nontender without any shifting dullness or ascites. Musculoskeletal: No clubbing or cyanosis. Neurological: No motor or sensory deficits. Skin: No rashes or lesions.   Lab Results: Lab Results  Component Value Date   WBC 7.7 10/26/2019   HGB 12.5 10/26/2019   HCT 39.6 10/26/2019   MCV 89.4 10/26/2019   PLT 226 10/26/2019     Chemistry      Component Value Date/Time   NA 142 08/29/2019 0920   NA 142 03/31/2019 1320   NA 142 03/12/2017 1116   K 3.9 08/29/2019 0920   K 4.2 03/12/2017 1116   CL 108 08/29/2019 0920   CL 100 03/18/2012 1422   CO2 23 08/29/2019 0851   CO2 25 03/12/2017 1116   BUN 19 08/29/2019 0920   BUN 15 03/31/2019 1320   BUN 18.8 03/12/2017 1116   CREATININE 0.60 08/29/2019 0920   CREATININE 0.81 10/21/2017 0954   CREATININE 0.8 03/12/2017 1116      Component Value Date/Time   CALCIUM 9.1 08/29/2019 0851   CALCIUM 9.7 03/12/2017 1116   ALKPHOS 58 08/29/2019 0851   ALKPHOS 71 03/12/2017 1116   AST 23 08/29/2019 0851   AST 23 10/21/2017 0954   AST 27 03/12/2017 1116   ALT 26 08/29/2019 0851   ALT 27 10/21/2017 0954   ALT 32 03/12/2017 1116   BILITOT 0.8 08/29/2019 0851   BILITOT 0.7 10/21/2017 0954   BILITOT 0.64 03/12/2017 1116         Impression and Plan:  74 year old woman with:   1.  Stage II colon cancer diagnosed in 2006.  She is currently in remission after surgical resection and adjuvant therapy without any evidence of relapsed disease..  She is currently on active surveillance at this time with very low risk of relapse at this time.  She is up-to-date on her colonoscopy without any additional intervention is needed.  No signs or symptoms of disease recurrence at this time.  Salvage therapy will only be introduced if she has recurrent disease   2. Breast cancer: Remote and has been in remission since 1999.  She is  up-to-date on her mammography.  3.  Osteoporosis: Currently on Fosamax without any recent issues.  4.  Cardiomyopathy and hypertension: Continues to follow with cardiology regarding this issue.  5. Follow-up: She will return in 4 months for a follow-up.  20  minutes were dedicated to this encounter.  The time was spent on reviewing disease status, management options as well as future plan of care.   Zola Button, MD 3/30/202112:10 PM

## 2019-10-27 ENCOUNTER — Telehealth: Payer: Self-pay | Admitting: Oncology

## 2019-10-27 DIAGNOSIS — M5388 Other specified dorsopathies, sacral and sacrococcygeal region: Secondary | ICD-10-CM | POA: Diagnosis not present

## 2019-10-27 DIAGNOSIS — M9903 Segmental and somatic dysfunction of lumbar region: Secondary | ICD-10-CM | POA: Diagnosis not present

## 2019-10-27 DIAGNOSIS — Q72812 Congenital shortening of left lower limb: Secondary | ICD-10-CM | POA: Diagnosis not present

## 2019-10-27 DIAGNOSIS — M9905 Segmental and somatic dysfunction of pelvic region: Secondary | ICD-10-CM | POA: Diagnosis not present

## 2019-10-27 NOTE — Telephone Encounter (Signed)
Scheduled appt per 3/30 los.  Sent a message to HIM pool to get a calendar mailed out.

## 2019-11-03 DIAGNOSIS — I428 Other cardiomyopathies: Secondary | ICD-10-CM | POA: Diagnosis not present

## 2019-11-03 DIAGNOSIS — E7800 Pure hypercholesterolemia, unspecified: Secondary | ICD-10-CM | POA: Diagnosis not present

## 2019-11-03 DIAGNOSIS — I10 Essential (primary) hypertension: Secondary | ICD-10-CM | POA: Diagnosis not present

## 2019-11-03 DIAGNOSIS — M8588 Other specified disorders of bone density and structure, other site: Secondary | ICD-10-CM | POA: Diagnosis not present

## 2019-11-03 DIAGNOSIS — R946 Abnormal results of thyroid function studies: Secondary | ICD-10-CM | POA: Diagnosis not present

## 2019-11-03 DIAGNOSIS — I83893 Varicose veins of bilateral lower extremities with other complications: Secondary | ICD-10-CM | POA: Diagnosis not present

## 2019-11-03 DIAGNOSIS — Z85038 Personal history of other malignant neoplasm of large intestine: Secondary | ICD-10-CM | POA: Diagnosis not present

## 2019-11-03 DIAGNOSIS — E559 Vitamin D deficiency, unspecified: Secondary | ICD-10-CM | POA: Diagnosis not present

## 2019-11-03 DIAGNOSIS — Z0000 Encounter for general adult medical examination without abnormal findings: Secondary | ICD-10-CM | POA: Diagnosis not present

## 2019-11-03 DIAGNOSIS — Z853 Personal history of malignant neoplasm of breast: Secondary | ICD-10-CM | POA: Diagnosis not present

## 2019-11-03 DIAGNOSIS — M5432 Sciatica, left side: Secondary | ICD-10-CM | POA: Diagnosis not present

## 2019-11-05 ENCOUNTER — Ambulatory Visit: Payer: Medicare Other | Admitting: Obstetrics & Gynecology

## 2019-11-08 DIAGNOSIS — M7918 Myalgia, other site: Secondary | ICD-10-CM | POA: Diagnosis not present

## 2019-11-08 DIAGNOSIS — M47816 Spondylosis without myelopathy or radiculopathy, lumbar region: Secondary | ICD-10-CM | POA: Diagnosis not present

## 2019-11-08 DIAGNOSIS — M545 Low back pain: Secondary | ICD-10-CM | POA: Diagnosis not present

## 2019-11-08 DIAGNOSIS — M5136 Other intervertebral disc degeneration, lumbar region: Secondary | ICD-10-CM | POA: Diagnosis not present

## 2019-11-08 DIAGNOSIS — G894 Chronic pain syndrome: Secondary | ICD-10-CM | POA: Diagnosis not present

## 2019-11-12 DIAGNOSIS — Z85038 Personal history of other malignant neoplasm of large intestine: Secondary | ICD-10-CM | POA: Diagnosis not present

## 2019-11-12 DIAGNOSIS — E785 Hyperlipidemia, unspecified: Secondary | ICD-10-CM | POA: Diagnosis not present

## 2019-11-12 DIAGNOSIS — I10 Essential (primary) hypertension: Secondary | ICD-10-CM | POA: Diagnosis not present

## 2019-11-12 DIAGNOSIS — Z853 Personal history of malignant neoplasm of breast: Secondary | ICD-10-CM | POA: Diagnosis not present

## 2019-11-12 DIAGNOSIS — C50919 Malignant neoplasm of unspecified site of unspecified female breast: Secondary | ICD-10-CM | POA: Diagnosis not present

## 2019-11-12 DIAGNOSIS — D508 Other iron deficiency anemias: Secondary | ICD-10-CM | POA: Diagnosis not present

## 2019-11-12 DIAGNOSIS — M1990 Unspecified osteoarthritis, unspecified site: Secondary | ICD-10-CM | POA: Diagnosis not present

## 2019-11-12 DIAGNOSIS — M19042 Primary osteoarthritis, left hand: Secondary | ICD-10-CM | POA: Diagnosis not present

## 2019-11-12 DIAGNOSIS — C189 Malignant neoplasm of colon, unspecified: Secondary | ICD-10-CM | POA: Diagnosis not present

## 2019-11-12 DIAGNOSIS — E7800 Pure hypercholesterolemia, unspecified: Secondary | ICD-10-CM | POA: Diagnosis not present

## 2019-11-17 ENCOUNTER — Ambulatory Visit: Payer: Medicare Other | Admitting: Cardiology

## 2019-11-22 MED FILL — METOPROLOL SUCCINATE ER 100: 100 | 90 days supply | Qty: 90 | Fill #1

## 2019-11-22 NOTE — Progress Notes (Signed)
HPI: Follow-up cardiomyopathy.Patientpreviously seen for palpitations. Echocardiogram 10/19showed newly reduced LV function with ejection fraction 25 to 30%, mild mitral regurgitation and biatrial enlargement. Carotid Dopplers October 2019 showed 1 to 39% bilateral stenosis.Cardiac catheterization October 2019 showed no coronary disease. Holter monitor November 2019 showed sinus rhythm with PACs, brief PAT, PVCs, occasional couplet and 3 beats nonsustained ventricular tachycardia. Cardiac MRI November 2020 showed ejection fraction 42% with no evidence of infiltrative cardiomyopathy. On January 28 we changed patient's losartan to [facility]. She then went to the emergency room because of near syncope. Her blood pressure was found to be 130/90.  She was noted to have frequent PVCs and 5 beats NSVT in the emergency room. She was seen by Dr. Rayann Heman and episodes felt potentially to be vagally mediated with possible contribution from orthostasis. However given her ectopy he did apparently offer admission for ICD implantation; pt declined. Since last seenshe denies dyspnea, chest pain, palpitations or syncope.  Current Outpatient Medications  Medication Sig Dispense Refill  . acetaminophen (TYLENOL) 500 MG tablet Take 500 mg by mouth every 6 (six) hours as needed for moderate pain or headache.    Marland Kitchen amLODipine (NORVASC) 5 MG tablet Take 1 tablet (5 mg total) by mouth daily. (Patient taking differently: Take 10 mg by mouth daily. ) 180 tablet 3  . b complex vitamins tablet Take 1 tablet by mouth 2 (two) times a week.     . Calcium Carb-Cholecalciferol (CALCIUM 500+D PO) Take 1 tablet by mouth daily.    . colestipol (COLESTID) 1 g tablet Take 1 g by mouth daily after lunch.    . losartan (COZAAR) 100 MG tablet Take 1 tablet (100 mg total) by mouth daily. 90 tablet 3  . metoprolol succinate (TOPROL-XL) 100 MG 24 hr tablet Take 1 tablet (100 mg total) by mouth daily. Must keep scheduled appt for future  refills 90 tablet 3  . Multiple Minerals (CALCIUM-MAGNESIUM-ZINC) TABS Take 1 tablet by mouth 2 (two) times a week.     . Multiple Vitamin (MULTIVITAMIN WITH MINERALS) TABS tablet Take 1 tablet by mouth daily.    Marland Kitchen omeprazole (PRILOSEC) 20 MG capsule Take 1 capsule (20 mg total) by mouth daily. 90 capsule 3  . vitamin B-12 (CYANOCOBALAMIN) 500 MCG tablet Take 500 mcg by mouth See admin instructions. Take 500 mcg 5 days weekly, takes on non b complex days    . vitamin C (ASCORBIC ACID) 500 MG tablet Take 500 mg by mouth 2 (two) times daily.     . Vitamin D, Ergocalciferol, (DRISDOL) 50000 UNITS CAPS Take 50,000 Units by mouth every Wednesday.     . vitamin E 400 UNIT capsule Take 400 Units by mouth daily.     No current facility-administered medications for this visit.     Past Medical History:  Diagnosis Date  . Anemia   . Axillary mass 7/99   Left  . Barrett's esophagus   . Blood transfusion without reported diagnosis   . CARCINOMA, BREAST 1995   left  . Cervical spinal stenosis   . 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  . Headache(784.0)   . HEMORRHOIDS   . Hypertension   . Personal history of chemotherapy   . Personal history of radiation therapy     Past Surgical History:  Procedure Laterality Date  . BIOPSY  08/10/2019   Procedure: BIOPSY;  Surgeon: Mauri Pole, MD;  Location: [facility];  Service: Endoscopy;;  . BONE MARROW  TRANSPLANT  1999  . BREAST LUMPECTOMY Left 11/95   left breast  . BUNIONECTOMY  3/09, 10/09   right and left  . COLON SURGERY    . COLONOSCOPY    . COLONOSCOPY WITH PROPOFOL [name] 08/10/2019   Procedure: COLONOSCOPY WITH PROPOFOL;  Surgeon: Mauri Pole, MD;  Location: [facility];  Service: Endoscopy;  Laterality: [name];  . ERCP  01/21/2012   Procedure: ENDOSCOPIC RETROGRADE CHOLANGIOPANCREATOGRAPHY (ERCP);  Surgeon: Lafayette Dragon, MD;  Location: [facility];  Service: Endoscopy;  Laterality: [name];  . hemicolectomy  01/2005   right  .  HYSTEROSCOPY  08/30/94  . INGUINAL HERNIA REPAIR    . LEFT HEART CATH AND CORONARY ANGIOGRAPHY [name] 05/21/2018   Procedure: LEFT HEART CATH AND CORONARY ANGIOGRAPHY;  Surgeon: Nelva Bush, MD;  Location: [facility];  Service: Cardiovascular;  Laterality: [name];  . NECK MASS EXCISION Left 7/99  . ROTATOR CUFF REPAIR Right 09/08/13   full tear  . TOTAL ABDOMINAL HYSTERECTOMY W/ BILATERAL SALPINGOOPHORECTOMY  1996  . TUNNELED VENOUS CATHETER PLACEMENT  9/98    Social History   Socioeconomic History  . Marital status: Married    Spouse name: Not on file  . Number of children: 2  . Years of education: Not on file  . Highest education level: Not on file  Occupational History  . Not on file  Tobacco Use  . Smoking status: Former Smoker    Quit date: 12/20/1990    Years since quitting: 28.9  . Smokeless tobacco: Never Used  Substance and Sexual Activity  . Alcohol use: Yes    Alcohol/week: 2.0 standard drinks    Types: 2 Standard drinks or equivalent per week  . Drug use: No  . Sexual activity: Not Currently    Birth control/protection: Surgical    Comment: hysterectomy  Other Topics Concern  . Not on file  Social History Narrative  . Not on file   Social Determinants of Health   Financial Resource Strain:   . Difficulty of Paying Living Expenses:   Food Insecurity:   . Worried About Charity fundraiser in the Last Year:   . Arboriculturist in the Last Year:   Transportation Needs:   . Film/video editor (Medical):   Marland Kitchen Lack of Transportation (Non-Medical):   Physical Activity:   . Days of Exercise per Week:   . Minutes of Exercise per Session:   Stress:   . Feeling of Stress :   Social Connections:   . Frequency of Communication with Friends and Family:   . Frequency of Social Gatherings with Friends and Family:   . Attends Religious Services:   . Active Member of Clubs or Organizations:   . Attends Archivist Meetings:   Marland Kitchen Marital Status:     Intimate Partner Violence:   . Fear of Current or Ex-Partner:   . Emotionally Abused:   Marland Kitchen Physically Abused:   . Sexually Abused:     Family History  Problem Relation Age of Onset  . Uterine cancer Mother   . Heart attack Father   . Colon cancer Neg Hx   . Esophageal cancer Neg Hx   . Stomach cancer Neg Hx   . Rectal cancer Neg Hx     ROS: no fevers or chills, productive cough, hemoptysis, dysphasia, odynophagia, melena, hematochezia, dysuria, hematuria, rash, seizure activity, orthopnea, PND, pedal edema, claudication. Remaining systems are negative.  Physical Exam: Well-developed well-nourished in no acute  distress.  Skin is warm and dry.  HEENT is normal.  Neck is supple.  Chest is clear to auscultation with normal expansion.  Cardiovascular exam is regular rate and rhythm.  Abdominal exam nontender or distended. No masses palpated. Extremities show no edema. neuro grossly intact  ECG- personally reviewed  A/P  1 nonischemic cardiomyopathy-etiology remains unclear.  Previous catheterization revealed no coronary disease.  Previous monitor showed ectopy but did not appear to be sufficient to cause cardiomyopathy.  No history of alcohol abuse.  Her blood pressure remains elevated and may be contributing.  Plan to continue losartan (she felt she had an allergic reaction to [facility] and not willing to retry) and Toprol.  She is now willing to increase the Toprol.  She will consider changing to carvedilol in the future once her Toprol expires.  Plan repeat echocardiogram to see if LV function has now improved.  2 hypertension-blood pressure elevated.  However she is not willing to increase her Toprol or add additional medications.  3 PVCs/nonsustained ventricular tachycardia-continue Toprol.  Kirk Ruths, MD

## 2019-11-25 ENCOUNTER — Encounter: Payer: Self-pay | Admitting: Cardiology

## 2019-11-25 ENCOUNTER — Other Ambulatory Visit: Payer: Self-pay

## 2019-11-25 ENCOUNTER — Encounter: Payer: Self-pay | Admitting: *Deleted

## 2019-11-25 ENCOUNTER — Ambulatory Visit (INDEPENDENT_AMBULATORY_CARE_PROVIDER_SITE_OTHER): Payer: Medicare Other | Admitting: Cardiology

## 2019-11-25 VITALS — BP 144/68 | HR 74 | Ht 63.0 in | Wt 145.0 lb

## 2019-11-25 DIAGNOSIS — I428 Other cardiomyopathies: Secondary | ICD-10-CM

## 2019-11-25 DIAGNOSIS — I493 Ventricular premature depolarization: Secondary | ICD-10-CM

## 2019-11-25 DIAGNOSIS — I429 Cardiomyopathy, unspecified: Secondary | ICD-10-CM

## 2019-11-25 DIAGNOSIS — I10 Essential (primary) hypertension: Secondary | ICD-10-CM | POA: Diagnosis not present

## 2019-11-25 NOTE — Patient Instructions (Signed)
Medication Instructions:  NO CHANGE *If you need a refill on your cardiac medications before your next appointment, please call your pharmacy*   Lab Work: If you have labs (blood work) drawn today and your tests are completely normal, you will receive your results only by: . MyChart Message (if you have MyChart) OR . A paper copy in the mail If you have any lab test that is abnormal or we need to change your treatment, we will call you to review the results.   Testing/Procedures:  Your physician has requested that you have an echocardiogram. Echocardiography is a painless test that uses sound waves to create images of your heart. It provides your doctor with information about the size and shape of your heart and how well your heart's chambers and valves are working. This procedure takes approximately one hour. There are no restrictions for this procedure.[address]   Follow-Up: At [facility], you and your health needs are our priority.  As part of our continuing mission to provide you with exceptional heart care, we have created designated Provider Care Teams.  These Care Teams include your primary Cardiologist (physician) and Advanced Practice Providers (APPs -  Physician Assistants and Nurse Practitioners) who all work together to provide you with the care you need, when you need it.  We recommend signing up for the patient portal called "MyChart".  Sign up information is provided on this After Visit Summary.  MyChart is used to connect with patients for Virtual Visits (Telemedicine).  Patients are able to view lab/test results, encounter notes, upcoming appointments, etc.  Non-urgent messages can be sent to your provider as well.   To learn more about what you can do with MyChart, go to [email or web address].    Your next appointment:   6 month(s)  The format for your next appointment:   Either In Person or Virtual  Provider:   You may see Brian Crenshaw, MD or one  of the following Advanced Practice Providers on your designated Care Team:    Luke Kilroy, PA-C  Callie Goodrich, PA-C  Jesse Cleaver, FNP     

## 2019-11-29 ENCOUNTER — Telehealth: Payer: Self-pay | Admitting: *Deleted

## 2019-11-29 NOTE — Telephone Encounter (Signed)
Patient is in [place] hold for 6 mo f/u of MRI abdomen w/wo contrast at [facility] for spleen anomaly.   Per review of Care Everywhere, imaging completed on 10/13/19.   Routing to Dr. Sabra Heck to review.   OK to remove from IMG hold?

## 2019-11-30 DIAGNOSIS — M7981 Nontraumatic hematoma of soft tissue: Secondary | ICD-10-CM | POA: Diagnosis not present

## 2019-11-30 DIAGNOSIS — I824Z2 Acute embolism and thrombosis of unspecified deep veins of left distal lower extremity: Secondary | ICD-10-CM | POA: Diagnosis not present

## 2019-11-30 DIAGNOSIS — I8312 Varicose veins of left lower extremity with inflammation: Secondary | ICD-10-CM | POA: Diagnosis not present

## 2019-11-30 DIAGNOSIS — I8002 Phlebitis and thrombophlebitis of superficial vessels of left lower extremity: Secondary | ICD-10-CM | POA: Diagnosis not present

## 2019-12-01 NOTE — Telephone Encounter (Signed)
MRI showed hemagioma.  No follow up recommended.  Please remove from imaging hold.  Thanks.

## 2019-12-01 NOTE — Telephone Encounter (Signed)
Patient notified of results. Patient verbalizes understanding and is agreeable.   Removed from IMG hold.   Encounter closed.

## 2019-12-07 ENCOUNTER — Ambulatory Visit: Payer: Medicare Other | Admitting: Obstetrics & Gynecology

## 2019-12-14 ENCOUNTER — Ambulatory Visit: Payer: Medicare Other | Admitting: Cardiology

## 2019-12-15 ENCOUNTER — Other Ambulatory Visit: Payer: Self-pay

## 2019-12-15 ENCOUNTER — Ambulatory Visit (HOSPITAL_COMMUNITY): Payer: Medicare Other | Attending: Cardiovascular Disease

## 2019-12-15 ENCOUNTER — Other Ambulatory Visit: Payer: Self-pay | Admitting: Family Medicine

## 2019-12-15 DIAGNOSIS — I428 Other cardiomyopathies: Secondary | ICD-10-CM | POA: Insufficient documentation

## 2019-12-15 DIAGNOSIS — Z1231 Encounter for screening mammogram for malignant neoplasm of breast: Secondary | ICD-10-CM

## 2019-12-28 DIAGNOSIS — M47816 Spondylosis without myelopathy or radiculopathy, lumbar region: Secondary | ICD-10-CM | POA: Diagnosis not present

## 2019-12-29 DIAGNOSIS — I420 Dilated cardiomyopathy: Secondary | ICD-10-CM | POA: Diagnosis not present

## 2019-12-29 DIAGNOSIS — I10 Essential (primary) hypertension: Secondary | ICD-10-CM | POA: Diagnosis not present

## 2019-12-29 DIAGNOSIS — R0602 Shortness of breath: Secondary | ICD-10-CM | POA: Diagnosis not present

## 2019-12-29 DIAGNOSIS — I428 Other cardiomyopathies: Secondary | ICD-10-CM | POA: Insufficient documentation

## 2019-12-29 MED FILL — SPIRONOLACTONE 25 MG TABS: 25 | 30 days supply | Qty: 30 | Fill #0

## 2019-12-30 MED FILL — VIT D2 1.25 MG (50,000 UNIT: 1.25 MG | 84 days supply | Qty: 12 | Fill #1

## 2020-01-10 ENCOUNTER — Other Ambulatory Visit: Payer: Self-pay

## 2020-01-10 ENCOUNTER — Ambulatory Visit
Admission: RE | Admit: 2020-01-10 | Discharge: 2020-01-10 | Disposition: A | Discharge status: Home / Self Care | Payer: Medicare Other | Source: Ambulatory Visit | Attending: Family Medicine | Admitting: Family Medicine

## 2020-01-10 DIAGNOSIS — Z1231 Encounter for screening mammogram for malignant neoplasm of breast: Secondary | ICD-10-CM | POA: Diagnosis not present

## 2020-01-11 DIAGNOSIS — I8312 Varicose veins of left lower extremity with inflammation: Secondary | ICD-10-CM | POA: Diagnosis not present

## 2020-01-14 DIAGNOSIS — R0602 Shortness of breath: Secondary | ICD-10-CM | POA: Diagnosis not present

## 2020-01-14 DIAGNOSIS — I420 Dilated cardiomyopathy: Secondary | ICD-10-CM | POA: Diagnosis not present

## 2020-01-18 DIAGNOSIS — M47816 Spondylosis without myelopathy or radiculopathy, lumbar region: Secondary | ICD-10-CM | POA: Diagnosis not present

## 2020-02-01 DIAGNOSIS — R7989 Other specified abnormal findings of blood chemistry: Secondary | ICD-10-CM | POA: Diagnosis not present

## 2020-02-01 DIAGNOSIS — I10 Essential (primary) hypertension: Secondary | ICD-10-CM | POA: Diagnosis not present

## 2020-02-02 MED FILL — AMLODIPINE BESYLATE 10 MG T: 10 | 90 days supply | Qty: 90 | Fill #0

## 2020-02-02 MED FILL — METOPROLOL SUCCINATE ER 100: 100 | 90 days supply | Qty: 90 | Fill #0

## 2020-02-03 MED FILL — LOSARTAN POTASSIUM 100 MG T: 100 | 90 days supply | Qty: 90 | Fill #0

## 2020-02-09 DIAGNOSIS — I10 Essential (primary) hypertension: Secondary | ICD-10-CM | POA: Diagnosis not present

## 2020-02-09 DIAGNOSIS — I420 Dilated cardiomyopathy: Secondary | ICD-10-CM | POA: Diagnosis not present

## 2020-02-09 MED FILL — CARVEDILOL 12.5 MG TABLET: 12.5 | 15 days supply | Qty: 30 | Fill #0

## 2020-02-09 MED FILL — FUROSEMIDE 20 MG TAB: 20 | 30 days supply | Qty: 60 | Fill #0

## 2020-02-10 ENCOUNTER — Telehealth: Payer: Self-pay

## 2020-02-10 DIAGNOSIS — M47816 Spondylosis without myelopathy or radiculopathy, lumbar region: Secondary | ICD-10-CM | POA: Diagnosis not present

## 2020-02-10 NOTE — Telephone Encounter (Signed)
Spoke with patient. Patient reports intermittent pain and pressure in lower abdomen for approximately 1 year. Seen by urology in 07/2019, GI in 07/2019 and by PCP 09/2019. Patient reports the pain is sharp and increased in intensity on 02/09/20, pain is currently 6/10 on pain scale. Reports normal BMs. Denies vaginal bleeding, N/V, fever/chills, vaginal odor or d/c. Reports urinary frequency at night is not new for her. She is unsure if there is odor in her urine. Reports intermittent dysuria that started last night. Is voiding normal amounts. Hx of hysterectomy.   Last AEX 03/31/19.  Advised patient I will review with Dr. Sabra Heck and return call with recommendations. Patient agreeable.   Dr. Sabra Heck -please review and advise if OV recommended with GYN or PCP.

## 2020-02-10 NOTE — Telephone Encounter (Signed)
Patient is calling in regards to pressure in lower abdomen area. Patient states she is in a lot of pain.

## 2020-02-10 NOTE — Progress Notes (Signed)
GYNECOLOGY  VISIT  CC:   Patient here for pelvic pressure she states is constant.   HPI: 74 y.o. G3P2 Married White or Caucasian female here for pelvic pain/pressure that started a couple of weeks ago.  She reports this is a stinging type of pain.  She reports she is having more urinary frequency and increased frequency at night.  She is now getting up about every two weeks.  She saw a urologist in January at [facility] (she cannot remember the name) but she said there "was nothing found".  She did have an office cystoscopy.  H/o colon cancer with normal colonoscopy (except for one small polyp) 08/10/2019.  Has been seeing Dr. Dorene Ar, NSG, due to spondylosis without and had undergone three injections.  These have helped her low back pain.  She's had this for years.  Pt did have similar symptoms last fall and underwent a CT of abdomen/pelvis   POCT urine- trace RBC's  GYNECOLOGIC HISTORY: Patient's last menstrual period was 07/30/1995. Contraception: Hysterectomy  Menopausal hormone therapy: none  Patient Active Problem List   Diagnosis Date Noted  . Abdominal pain, left upper quadrant   . Polyp of descending colon   . Chronic systolic heart failure ([place]) 05/21/2018  . Cardiomyopathy ([place])   . Hypertension 11/06/2017  . Complete rotator cuff rupture of left shoulder 09/21/2014  . Abdominal pain, epigastric 08/24/2013  . Belching 08/24/2013  . Other abnormal blood chemistry 01/21/2012  . Nonspecific (abnormal) findings on radiological and other examination of biliary tract 01/21/2012  . TIA (transient ischemic attack) 11/05/2011  . PVC's (premature ventricular contractions) 10/11/2011  . Nonspecific abnormal unspecified cardiovascular function study 12/20/2010  . Malignant neoplasm of female breast ([place]) 08/23/2008  . HEMORRHOIDS 08/23/2008  . CONSTIPATION, CHRONIC 08/23/2008  . RECTAL BLEEDING 08/23/2008  . History of colon cancer 08/23/2008  . Malignant neoplasm  of colon ([place]) 01/26/2005    Past Medical History:  Diagnosis Date  . Anemia   . Axillary mass 7/99   Left  . Barrett's esophagus   . Blood transfusion without reported diagnosis   . CARCINOMA, BREAST 1995   left  . Cervical spinal stenosis   . 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  . Headache(784.0)   . HEMORRHOIDS   . Hypertension   . Personal history of chemotherapy   . Personal history of radiation therapy     Past Surgical History:  Procedure Laterality Date  . BIOPSY  08/10/2019   Procedure: BIOPSY;  Surgeon: Mauri Pole, MD;  Location: [facility];  Service: Endoscopy;;  . BONE MARROW TRANSPLANT  1999  . BREAST LUMPECTOMY Left 11/95   left breast  . BUNIONECTOMY  3/09, 10/09   right and left  . COLON SURGERY    . COLONOSCOPY    . COLONOSCOPY WITH PROPOFOL [name] 08/10/2019   Procedure: COLONOSCOPY WITH PROPOFOL;  Surgeon: Mauri Pole, MD;  Location: [facility];  Service: Endoscopy;  Laterality: [name];  . ERCP  01/21/2012   Procedure: ENDOSCOPIC RETROGRADE CHOLANGIOPANCREATOGRAPHY (ERCP);  Surgeon: Lafayette Dragon, MD;  Location: [facility];  Service: Endoscopy;  Laterality: [name];  . hemicolectomy  01/2005   right  . HYSTEROSCOPY  08/30/94  . INGUINAL HERNIA REPAIR    . LEFT HEART CATH AND CORONARY ANGIOGRAPHY [name] 05/21/2018   Procedure: LEFT HEART CATH AND CORONARY ANGIOGRAPHY;  Surgeon: Nelva Bush, MD;  Location: [place] [facility];  Service: Cardiovascular;  Laterality: [name];  . NECK MASS EXCISION  Left 7/99  . ROTATOR CUFF REPAIR Right 09/08/13   full tear  . TOTAL ABDOMINAL HYSTERECTOMY W/ BILATERAL SALPINGOOPHORECTOMY  1996  . TUNNELED VENOUS CATHETER PLACEMENT  9/98    MEDS:   Current Outpatient Medications on File Prior to Visit  Medication Sig Dispense Refill  . acetaminophen (TYLENOL) 500 MG tablet Take 500 mg by mouth every 6 (six) hours as needed for moderate pain or headache.    . b complex vitamins tablet Take 1 tablet by mouth 2 (two)  times a week.     . Calcium Carb-Cholecalciferol (CALCIUM 500+D PO) Take 1 tablet by mouth daily.    . carvedilol (COREG) 12.5 MG tablet Take 12.5 mg by mouth 2 (two) times daily.    . colestipol (COLESTID) 1 g tablet Take 1 g by mouth daily after lunch.    . furosemide (LASIX) 20 MG tablet Take by mouth.    . magnesium oxide (MAG-OX) 400 MG tablet Take by mouth.    . Multiple Minerals (CALCIUM-MAGNESIUM-ZINC) TABS Take 1 tablet by mouth 2 (two) times a week.     . Multiple Vitamin (MULTIVITAMIN WITH MINERALS) TABS tablet Take 1 tablet by mouth daily.    Marland Kitchen omeprazole (PRILOSEC) 20 MG capsule Take 1 capsule (20 mg total) by mouth daily. 90 capsule 3  . vitamin B-12 (CYANOCOBALAMIN) 500 MCG tablet Take 500 mcg by mouth See admin instructions. Take 500 mcg 5 days weekly, takes on non b complex days    . vitamin C (ASCORBIC ACID) 500 MG tablet Take 500 mg by mouth 2 (two) times daily.     . Vitamin D, Ergocalciferol, (DRISDOL) 50000 UNITS CAPS Take 50,000 Units by mouth every Wednesday.     . vitamin E 400 UNIT capsule Take 400 Units by mouth daily.    Marland Kitchen amLODipine (NORVASC) 5 MG tablet Take 1 tablet (5 mg total) by mouth daily. (Patient taking differently: Take 10 mg by mouth daily. ) 180 tablet 3  . losartan (COZAAR) 100 MG tablet Take 1 tablet (100 mg total) by mouth daily. 90 tablet 3   No current facility-administered medications on file prior to visit.    ALLERGIES: Levofloxacin and Tape  Family History  Problem Relation Age of Onset  . Uterine cancer Mother   . Heart attack Father   . Colon cancer Neg Hx   . Esophageal cancer Neg Hx   . Stomach cancer Neg Hx   . Rectal cancer Neg Hx     SH:  Married, non smoker  Review of Systems  Genitourinary: Positive for pelvic pain.  All other systems reviewed and are negative.   PHYSICAL EXAMINATION:    BP 136/82 (BP Location: Right Arm, Patient Position: Sitting, Cuff Size: Normal)   Pulse 70   Resp 14   Ht 5\' 4"  (1.626 m)   Wt  146 lb 4 oz (66.3 kg)   LMP 07/30/1995   BMI 25.10 kg/m     General appearance: alert, cooperative and appears stated age CV:  Regular rate and rhythm Lungs:  clear to auscultation, no wheezes, rales or rhonchi, symmetric air entry Breasts: normal appearance, no masses or tenderness Abdomen: soft, non-tender; bowel sounds normal; no masses,  no organomegaly Lymph:  no inguinal LAD noted  Pelvic: External genitalia:  no lesions              Urethra:  normal appearing urethra with no masses, tenderness or lesions  Bartholins and Skenes: normal                 Vagina: normal appearing vagina with normal color and discharge, no lesions              Cervix: absent               Bimanual Exam:  Uterus:  uterus absent              Adnexa: no mass, fullness, tenderness              Rectovaginal: Yes.  .  Confirms.              Anus:  normal sphincter tone, no lesions Tenderness noted with palpation of pelvic floor.  Chaperone, Joy Johnsonl, CMA, was present for exam.  Assessment: Pelvic pain/pressure Pelvic floor pain Urinary urgency Nocturia  Plan: Urine culture obtained Referral to pelvic PT placed today

## 2020-02-10 NOTE — Telephone Encounter (Signed)
Reviewed with Dr. Sabra Heck, call returned to patient. OV scheduled for 7/16 at 12pm with Dr. Sabra Heck. Patient verbalizes understanding and is agreeable.   Routing to provider for final review. Patient is agreeable to disposition. Will close encounter.

## 2020-02-11 ENCOUNTER — Encounter: Payer: Self-pay | Admitting: Obstetrics & Gynecology

## 2020-02-11 ENCOUNTER — Other Ambulatory Visit: Payer: Self-pay

## 2020-02-11 ENCOUNTER — Ambulatory Visit (INDEPENDENT_AMBULATORY_CARE_PROVIDER_SITE_OTHER): Payer: Medicare Other | Admitting: Obstetrics & Gynecology

## 2020-02-11 VITALS — BP 136/82 | HR 70 | Resp 14 | Ht 64.0 in | Wt 146.2 lb

## 2020-02-11 DIAGNOSIS — M6289 Other specified disorders of muscle: Secondary | ICD-10-CM | POA: Diagnosis not present

## 2020-02-11 DIAGNOSIS — R102 Pelvic and perineal pain: Secondary | ICD-10-CM

## 2020-02-11 DIAGNOSIS — R350 Frequency of micturition: Secondary | ICD-10-CM | POA: Diagnosis not present

## 2020-02-11 LAB — POCT URINALYSIS DIPSTICK
Bilirubin, UA: NEGATIVE
Glucose, UA: NEGATIVE
Ketones, UA: NEGATIVE
Leukocytes, UA: NEGATIVE
Nitrite, UA: NEGATIVE
Protein, UA: NEGATIVE
Urobilinogen, UA: 0.2 E.U./dL
pH, UA: 5 (ref 5.0–8.0)

## 2020-02-12 LAB — URINE CULTURE: Organism ID, Bacteria: NO GROWTH

## 2020-02-17 ENCOUNTER — Other Ambulatory Visit: Payer: Self-pay

## 2020-02-17 ENCOUNTER — Encounter: Payer: Self-pay | Admitting: Obstetrics & Gynecology

## 2020-02-17 ENCOUNTER — Other Ambulatory Visit: Payer: Self-pay | Admitting: *Deleted

## 2020-02-17 ENCOUNTER — Ambulatory Visit (INDEPENDENT_AMBULATORY_CARE_PROVIDER_SITE_OTHER): Payer: Medicare Other

## 2020-02-17 ENCOUNTER — Ambulatory Visit (INDEPENDENT_AMBULATORY_CARE_PROVIDER_SITE_OTHER): Payer: Medicare Other | Admitting: Obstetrics & Gynecology

## 2020-02-17 VITALS — BP 140/90 | HR 70 | Resp 16 | Wt 146.0 lb

## 2020-02-17 DIAGNOSIS — R102 Pelvic and perineal pain: Secondary | ICD-10-CM

## 2020-02-17 MED ORDER — MIRABEGRON ER 25 MG PO TB24: 25.0000 mg | ORAL_TABLET | Freq: Every day | ORAL | 2 refills | Status: DC

## 2020-02-17 NOTE — Progress Notes (Signed)
74 y.o. G3P2 Married White or Caucasian female here for pelvic ultrasound due to low pelvic pressure that has been present for several months.  Has seen urology.  H/o colon cancer with recent and normal colonoscopy.  Denies vaginal bleeding.  Ultrasound recommended as I do not feel CT is going to show [place] anything.  Exam has been normal.  Patient's last menstrual period was 07/30/1995.  Contraception: hysterectomy  Findings:  UTERUS: surgically absent EMS: n/a ADNEXA: Left ovary: surgically absent       Right ovary: surgically absent CUL DE SAC: no free fluid  Discussion:  Ultrasonographer supervised.  Exam is normal except urine in bladder noted and pt voided right before ultrasound.  She has been experiencing increased frequency of nocturia that we discussed at our last visit.  This may be cause of her symptoms.  Urine culture negative at most recent GYN visit within the past week.  results reviewed with pt.  Medication options for treatment of OAB discussed.  Pt would like to try medication..  Assessment:  Urinary retention noted on ultrasound Pelvic pressure nocturia  Plan:  myrbetriq 25mg  daily will be started.  Hypertension risk discussed.  Pt knows to call with any issue.s  Has BP cuff at home and will monitor.  Also, will change medication if cost is too much.  Release signed for urology records as well.  20 minutes spent with pt

## 2020-02-18 ENCOUNTER — Telehealth: Payer: Self-pay

## 2020-02-18 MED ORDER — OXYBUTYNIN CHLORIDE ER 5 MG PO TB24
5.0000 mg | ORAL_TABLET | Freq: Every day | ORAL | 1 refills | Status: DC
Start: 2020-02-18 — End: 2020-03-24

## 2020-02-18 MED FILL — OXYBUTYNIN CL ER 5 MG TAB: 5 | 30 days supply | Qty: 30 | Fill #0

## 2020-02-18 NOTE — Telephone Encounter (Signed)
Ok to send Ditropan XL 5mg  to pharmacy.  We discussed side effects yesterday including constipation, dry eyes, dry mouth.  She needs to stop medication if has any of these.  Ok to send #30/1RF.  She should give update in 3-4 weeks.

## 2020-02-18 NOTE — Telephone Encounter (Signed)
Spoke with patient. Patient is requesting an alternative to Myrbetriq 25 mg tab, her out of pocket cost is $208 for 30 day supply. States the pharmacy could not recommend a covered alternative. Advised patient I will update Dr. Sabra Heck and return call once reviewed, patient agreeable.   Routing to Dr. Sabra Heck to advise on alternative.

## 2020-02-18 NOTE — Telephone Encounter (Signed)
Spoke with patient, advised per Dr. Miller. Rx to verified pharmacy. Patient verbalizes understanding and is agreeable.   Encounter closed.  

## 2020-02-18 NOTE — Telephone Encounter (Signed)
Patient is calling in regards to medication prescribed. Patient has concerns, like to discuss with nurse.

## 2020-03-08 DIAGNOSIS — I420 Dilated cardiomyopathy: Secondary | ICD-10-CM | POA: Diagnosis not present

## 2020-03-08 MED FILL — CARVEDILOL 6.25 MG TABLET: 6.25 | 30 days supply | Qty: 60 | Fill #0

## 2020-03-13 DIAGNOSIS — R52 Pain, unspecified: Secondary | ICD-10-CM | POA: Diagnosis not present

## 2020-03-13 DIAGNOSIS — M25471 Effusion, right ankle: Secondary | ICD-10-CM | POA: Diagnosis not present

## 2020-03-13 DIAGNOSIS — L600 Ingrowing nail: Secondary | ICD-10-CM | POA: Diagnosis not present

## 2020-03-13 MED FILL — traMADol HCL 50 MG TABS: 50 | 2 days supply | Qty: 8 | Fill #0

## 2020-03-15 DIAGNOSIS — M5136 Other intervertebral disc degeneration, lumbar region: Secondary | ICD-10-CM | POA: Diagnosis not present

## 2020-03-15 DIAGNOSIS — M545 Low back pain: Secondary | ICD-10-CM | POA: Diagnosis not present

## 2020-03-15 DIAGNOSIS — M47816 Spondylosis without myelopathy or radiculopathy, lumbar region: Secondary | ICD-10-CM | POA: Diagnosis not present

## 2020-03-21 MED FILL — VIT D2 1.25 MG (50,000 UNIT: 1.25 MG | 84 days supply | Qty: 12 | Fill #0

## 2020-03-23 ENCOUNTER — Telehealth: Payer: Self-pay | Admitting: Obstetrics & Gynecology

## 2020-03-23 NOTE — Telephone Encounter (Signed)
It is ok to increase this to 10mg  of ditropan XL daily and see if this helps at all.  Ok to send in rx for new dosage 10mg  XL, 1 po q day.  #30/1RF.  Would like update again in 2-3 weeks.  Thanks.

## 2020-03-23 NOTE — Telephone Encounter (Signed)
Spoke with pt. Pt calling to give update to Dr Sabra Heck.  Pt states Ditropan XL 5 mg has not resolved OAB sx. Pt has first pelvic PT appt on 04/05/20 with Earlie Counts, PT. Pt aware and agreeable to keep appt.   Advised pt will give update to Dr Sabra Heck and return call with any recommendations or advice. Pt agreeable.  Routing to Dr Sabra Heck   Phone message from 02/18/20: Eileen Salon, MD to Eileen Logan, RN     02/18/20 3:14 PM Note Ok to send Ditropan XL 5mg  to pharmacy.  We discussed side effects yesterday including constipation, dry eyes, dry mouth.  She needs to stop medication if has any of these.  Ok to send #30/1RF.  She should give update in 3-4 weeks.

## 2020-03-23 NOTE — Telephone Encounter (Signed)
Patient says Oxybutnin is not helping her.

## 2020-03-24 MED ORDER — OXYBUTYNIN CHLORIDE ER 10 MG PO TB24: 10.0000 mg | ORAL_TABLET | Freq: Every day | ORAL | 1 refills | Status: DC

## 2020-03-24 MED FILL — OXYBUTYNIN CL ER 10 MG TAB: 10 | 30 days supply | Qty: 30 | Fill #0

## 2020-03-24 NOTE — Telephone Encounter (Signed)
Patient left message returning call.

## 2020-03-24 NOTE — Telephone Encounter (Signed)
Left message for pt to return call to triage RN. 

## 2020-03-24 NOTE — Telephone Encounter (Signed)
Spoke with pt. Pt given recommendations per Dr Sabra Heck. Pt agreeable to increase dosage to 10 mg daily. New Rx sent to pharmacy on file. # 30, 1RF. Pt verbalized understanding.  Pt will call around 9/20 with update.  Encounter closed.

## 2020-03-27 DIAGNOSIS — L600 Ingrowing nail: Secondary | ICD-10-CM | POA: Diagnosis not present

## 2020-03-28 ENCOUNTER — Telehealth: Payer: Self-pay | Admitting: Obstetrics & Gynecology

## 2020-03-28 NOTE — Telephone Encounter (Signed)
Patient would like to speak with nurse about medication and symptoms of over active bladder.

## 2020-03-28 NOTE — Telephone Encounter (Signed)
Spoke with pt. Pt states calling to give update to Dr Sabra Heck. Pt states has not increased Ditropan XL from previous encounter dated 03/23/20. Pt states stopped medication on 8/28 due to having increased urine frequency at night, having to go to bathroom every 1-2 hours and then when stopped, having less frequency.  Pt states still having pelvic pain and pressure in mid to lower abd. Pt would like to discuss with Dr Sabra Heck. Pt states still going to go to pelvic PT on 04/05/20 with Dr Pearline Cables.   Advised pt to have OV for discussion. Pt agreeable. Pt scheduled with Dr Sabra Heck on 04/04/20 at 2 pm. Pt agreeable and verbalized understanding to date and time of appt.   PUS 02/17/20: urinary retention noted.  Previous office cystoscopy at [facility] in Jan 2021. H/o colon cancer 07/2019.  Routing to Dr Sabra Heck for update and review.  Encounter closed

## 2020-03-30 NOTE — Progress Notes (Signed)
GYNECOLOGY  VISIT  CC:   Pelvic pain  HPI: 74 y.o. G3P2 Married White or Caucasian female here for pelvic pain/discomfort.  She has a sensation of pelvic pressure/pain that is not present every day.  She had a CT abd/pelvic 03/2019 and then PUS 02/17/2020.  CT showed 88mm splenic focus.  MRI was done as well.  Pt had a colonoscopy done 07/2019.  Pt has hx of TAH/BSO done in 1996 due to endometrial cancer.  She did have colon cancer with partial colectomy.  She did try oxybutynin but this made her get up more at night.  I suggested she try myrbetriq but she is anxious about this affecting her blood pressure so she did not try this.  She is tearful about this today and how her blood pressure is affecting her   She does have an appt with pelvic PT scheduled on 9/15, 9/22, and 9/27.  She is exercising regularly as well.  She is willing to try this and knows that I do not have any other suggestions for her about this.  She's seen urology (Dr. Matilde Sprang) this year as well as neurosurgery this year.    GYNECOLOGIC HISTORY: Patient's last menstrual period was 07/30/1995. Contraception: hysterectomy Menopausal hormone therapy: none  Patient Active Problem List   Diagnosis Date Noted  . Abdominal pain, left upper quadrant   . Polyp of descending colon   . Chronic systolic heart failure ([place]) 05/21/2018  . Cardiomyopathy ([place])   . Hypertension 11/06/2017  . Complete rotator cuff rupture of left shoulder 09/21/2014  . Abdominal pain, epigastric 08/24/2013  . Belching 08/24/2013  . Other abnormal blood chemistry 01/21/2012  . Nonspecific (abnormal) findings on radiological and other examination of biliary tract 01/21/2012  . TIA (transient ischemic attack) 11/05/2011  . PVC's (premature ventricular contractions) 10/11/2011  . Nonspecific abnormal unspecified cardiovascular function study 12/20/2010  . Malignant neoplasm of female breast ([place]) 08/23/2008  . HEMORRHOIDS 08/23/2008  . CONSTIPATION, CHRONIC  08/23/2008  . RECTAL BLEEDING 08/23/2008  . History of colon cancer 08/23/2008  . Malignant neoplasm of colon ([place]) 01/26/2005    Past Medical History:  Diagnosis Date  . Anemia   . Axillary mass 7/99   Left  . Barrett's esophagus   . Blood transfusion without reported diagnosis   . CARCINOMA, BREAST 1995   left  . Cervical spinal stenosis   . 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  . Headache(784.0)   . HEMORRHOIDS   . Hypertension   . Personal history of chemotherapy   . Personal history of radiation therapy     Past Surgical History:  Procedure Laterality Date  . BIOPSY  08/10/2019   Procedure: BIOPSY;  Surgeon: Mauri Pole, MD;  Location: [facility];  Service: Endoscopy;;  . BONE MARROW TRANSPLANT  1999  . BREAST LUMPECTOMY Left 11/95   left breast  . BUNIONECTOMY  3/09, 10/09   right and left  . COLON SURGERY    . COLONOSCOPY    . COLONOSCOPY WITH PROPOFOL [name] 08/10/2019   Procedure: COLONOSCOPY WITH PROPOFOL;  Surgeon: Mauri Pole, MD;  Location: [facility];  Service: Endoscopy;  Laterality: [name];  . ERCP  01/21/2012   Procedure: ENDOSCOPIC RETROGRADE CHOLANGIOPANCREATOGRAPHY (ERCP);  Surgeon: Lafayette Dragon, MD;  Location: [facility];  Service: Endoscopy;  Laterality: [name];  . hemicolectomy  01/2005   right  . HYSTEROSCOPY  08/30/94  . INGUINAL HERNIA REPAIR    . LEFT HEART CATH AND CORONARY  ANGIOGRAPHY [name] 05/21/2018   Procedure: LEFT HEART CATH AND CORONARY ANGIOGRAPHY;  Surgeon: Nelva Bush, MD;  Location: [place] [facility];  Service: Cardiovascular;  Laterality: [name];  . NECK MASS EXCISION Left 7/99  . ROTATOR CUFF REPAIR Right 09/08/13   full tear  . TOTAL ABDOMINAL HYSTERECTOMY W/ BILATERAL SALPINGOOPHORECTOMY  1996  . TUNNELED VENOUS CATHETER PLACEMENT  9/98    MEDS:   Current Outpatient Medications on File Prior to Visit  Medication Sig Dispense Refill  . acetaminophen (TYLENOL) 500 MG tablet Take 500 mg by mouth every 6 (six) hours as  needed for moderate pain or headache.    Marland Kitchen amLODipine (NORVASC) 10 MG tablet Take 10 mg by mouth daily.    Marland Kitchen b complex vitamins tablet Take 1 tablet by mouth 2 (two) times a week.     . Calcium Carb-Cholecalciferol (CALCIUM 500+D PO) Take 1 tablet by mouth daily.    . carvedilol (COREG) 12.5 MG tablet Take 12.5 mg by mouth daily.    . carvedilol (COREG) 6.25 MG tablet Take by mouth.    . losartan (COZAAR) 100 MG tablet Take 1 tablet (100 mg total) by mouth daily. 90 tablet 3  . magnesium oxide (MAG-OX) 400 MG tablet Take by mouth.    . Multiple Minerals (CALCIUM-MAGNESIUM-ZINC) TABS Take 1 tablet by mouth 2 (two) times a week.     . Multiple Vitamin (MULTIVITAMIN WITH MINERALS) TABS tablet Take 1 tablet by mouth daily.    . vitamin B-12 (CYANOCOBALAMIN) 500 MCG tablet Take 500 mcg by mouth See admin instructions. Take 500 mcg 5 days weekly, takes on non b complex days    . vitamin C (ASCORBIC ACID) 500 MG tablet Take 500 mg by mouth 2 (two) times daily.     . Vitamin D, Ergocalciferol, (DRISDOL) 50000 UNITS CAPS Take 50,000 Units by mouth every Wednesday.     . vitamin E 400 UNIT capsule Take 400 Units by mouth daily.     No current facility-administered medications on file prior to visit.    ALLERGIES: Levofloxacin and Tape  Family History  Problem Relation Age of Onset  . Uterine cancer Mother   . Heart attack Father   . Colon cancer Neg Hx   . Esophageal cancer Neg Hx   . Stomach cancer Neg Hx   . Rectal cancer Neg Hx     SH:  Married, non smoker  Review of Systems  Constitutional: Negative.   HENT: Negative.   Eyes: Negative.   Respiratory: Negative.   Cardiovascular: Negative.   Gastrointestinal: Negative.   Endocrine: Negative.   Genitourinary: Negative.   Musculoskeletal: Negative.   Skin: Negative.   Allergic/Immunologic: Negative.   Neurological: Negative.   Hematological: Negative.   Psychiatric/Behavioral: Negative.     PHYSICAL EXAMINATION:    BP 118/70    Pulse 70   Resp 16   Wt 143 lb (64.9 kg)   LMP 07/30/1995   BMI 24.55 kg/m     General appearance: alert, cooperative and appears stated age No exam performed today  Assessment: Pelvic pressure with concerns that she may have cancer or something else that is being missed.  No evidence of prolapse with exams H/o uterine cancer s/p TAH/BSO 1996 H/o colon cancer with partal colectomy in 2003, colonoscopy 08/10/2019  Plan: Pt's had CT 03/2019, MRI 09/2019 and pelvic ultrasound 01/2021 She's seen urology and failed 2 medications for OAB to see if this would improve symptoms She's had a normal pelvic exam  She has been referred to physical therapy and will start this next week. Pt is clearly aware that I don't know what else to test with her.  I am happy to refer her for second opinion but at this point, I just don't have other suggestions for her.  She clearly understands at this time declines referral for second opinion   25 minutes total spent in discussion with pt.

## 2020-04-04 ENCOUNTER — Encounter: Payer: Self-pay | Admitting: Obstetrics & Gynecology

## 2020-04-04 ENCOUNTER — Ambulatory Visit (INDEPENDENT_AMBULATORY_CARE_PROVIDER_SITE_OTHER): Payer: Medicare Other | Admitting: Obstetrics & Gynecology

## 2020-04-04 ENCOUNTER — Other Ambulatory Visit: Payer: Self-pay

## 2020-04-04 VITALS — BP 118/70 | HR 70 | Resp 16 | Wt 143.0 lb

## 2020-04-04 DIAGNOSIS — R102 Pelvic and perineal pain: Secondary | ICD-10-CM | POA: Diagnosis not present

## 2020-04-04 MED FILL — CARVEDILOL 12.5 MG TABLET: 12.5 | 15 days supply | Qty: 30 | Fill #1

## 2020-04-05 ENCOUNTER — Ambulatory Visit: Payer: Medicare Other | Admitting: Physical Therapy

## 2020-04-08 ENCOUNTER — Encounter: Payer: Self-pay | Admitting: Obstetrics & Gynecology

## 2020-04-12 ENCOUNTER — Encounter: Payer: Self-pay | Admitting: Physical Therapy

## 2020-04-12 ENCOUNTER — Other Ambulatory Visit: Payer: Self-pay

## 2020-04-12 ENCOUNTER — Ambulatory Visit: Payer: Medicare Other | Attending: Obstetrics & Gynecology | Admitting: Physical Therapy

## 2020-04-12 DIAGNOSIS — M6281 Muscle weakness (generalized): Secondary | ICD-10-CM

## 2020-04-12 DIAGNOSIS — R278 Other lack of coordination: Secondary | ICD-10-CM

## 2020-04-12 DIAGNOSIS — M62838 Other muscle spasm: Secondary | ICD-10-CM | POA: Diagnosis not present

## 2020-04-12 NOTE — Therapy (Signed)
[facility] [address], [place], [place], [address] Phone: [phone]   Fax:  [phone]  Physical Therapy Evaluation  Patient Details  Name: Eileen [name] MRN: [id number] Date of Birth: August 17, 1945 Referring Provider (PT): Dr. Hale Bogus   Encounter Date: 04/12/2020   PT End of Session - 04/12/20 1213    Visit Number 1    Date for PT Re-Evaluation 07/05/20    Authorization Type Medicare    Authorization - Visit Number 1    Authorization - Number of Visits 10    PT Start Time 7026    PT Stop Time 1228    PT Time Calculation (min) 43 min    Activity Tolerance Patient tolerated treatment well    Behavior During Therapy [facility] for tasks assessed/performed           Past Medical History:  Diagnosis Date  . Anemia   . Axillary mass 7/99   Left  . Barrett's esophagus   . Blood transfusion without reported diagnosis   . CARCINOMA, BREAST 1995   left  . Cervical spinal stenosis   . 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  . Headache(784.0)   . HEMORRHOIDS   . Hypertension   . Personal history of chemotherapy   . Personal history of radiation therapy     Past Surgical History:  Procedure Laterality Date  . BIOPSY  08/10/2019   Procedure: BIOPSY;  Surgeon: Mauri Pole, MD;  Location: [facility];  Service: Endoscopy;;  . BONE MARROW TRANSPLANT  1999  . BREAST LUMPECTOMY Left 11/95   left breast  . BUNIONECTOMY  3/09, 10/09   right and left  . COLON SURGERY    . COLONOSCOPY    . COLONOSCOPY WITH PROPOFOL [name] 08/10/2019   Procedure: COLONOSCOPY WITH PROPOFOL;  Surgeon: Mauri Pole, MD;  [facility];  Service: Endoscopy;  Laterality: [name];  . ERCP  01/21/2012   Procedure: ENDOSCOPIC RETROGRADE CHOLANGIOPANCREATOGRAPHY (ERCP);  Surgeon: Lafayette Dragon, MD;  Location: [facility];  Service: Endoscopy;  Laterality: [name];  . hemicolectomy  01/2005   right  . HYSTEROSCOPY  08/30/94  . INGUINAL  HERNIA REPAIR    . LEFT HEART CATH AND CORONARY ANGIOGRAPHY [name] 05/21/2018   Procedure: LEFT HEART CATH AND CORONARY ANGIOGRAPHY;  Surgeon: Nelva Bush, MD;  Location: [place] [facility];  Service: Cardiovascular;  Laterality: [name];  . NECK MASS EXCISION Left 7/99  . ROTATOR CUFF REPAIR Right 09/08/13   full tear  . TOTAL ABDOMINAL HYSTERECTOMY W/ BILATERAL SALPINGOOPHORECTOMY  1996  . TUNNELED VENOUS CATHETER PLACEMENT  9/98    There were no vitals filed for this visit.    Subjective Assessment - 04/12/20 1147    Subjective Sometimes I have pain and sometimes I do not. I had radiation for the breast and chemotherapy for the colon cancer. Patient reports pressure in the lower abdominal and uncomfortable. Had the pai for several years. TAkes medication for her heart that has changed her bowel movement. Bowel movement every morning. Does not feel she is emptying her bowels. Patient is able to hold her urine and does not leak. She goes to gym 5 days per week.    Patient Stated Goals reduce the pain    Currently in Pain? Yes    Pain Score 6     Pain Location Abdomen    Pain Orientation Lower    Pain Descriptors / Indicators Pressure;Discomfort    Pain Type  Chronic pain    Pain Onset More than a month ago    Pain Frequency Intermittent    Aggravating Factors  not sure    Pain Relieving Factors not sure    Multiple Pain Sites No              [facility] PT Assessment - 04/12/20 0001      Assessment   Medical Diagnosis R10.2 Pelvic pain; M62.89 Pelvic floor dysfunction    Referring Provider (PT) Dr. Hale Bogus    Onset Date/Surgical Date --   chronic   Prior Therapy no      Precautions   Precautions Other (comment)    Precaution Comments cancer      Restrictions   Weight Bearing Restrictions No      Balance Screen   Has the patient fallen in the past 6 months No    Has the patient had a decrease in activity level because of a fear of falling?  No    Is the patient reluctant  to leave their home because of a fear of falling?  No      Home Ecologist residence      Prior Function   Level of Independence Independent    Leisure gym 5/week      Cognition   Overall Cognitive Status Within Functional Limits for tasks assessed      Posture/Postural Control   Posture/Postural Control No significant limitations      ROM / Strength   AROM / PROM / Strength AROM;PROM;Strength      AROM   Overall AROM Comments lumbar ROM decreased by 25%      PROM   PROM Assessment Site Hip    Right Hip External Rotation  40    Right Hip Internal Rotation  25    Left Hip External Rotation  30    Left Hip Internal Rotation  30      Strength   Right Hip Extension 4/5    Right Hip ABduction 4-/5    Left Hip Extension 4/5    Left Hip ABduction 4-/5      Palpation   SI assessment  left ilium is higher than the right                      Objective measurements completed on examination: See above findings.     Pelvic Floor Special Questions - 04/12/20 0001    Prior Pregnancies Yes   vaginally   Number of Vaginal Deliveries 2    Urinary Leakage No    Fecal incontinence No    Prolapse Anterior Wall   slight drop of the bladder    Pelvic Floor Internal Exam Patient confirms identification and approves PT to asses pelvic floor and treatment    Exam Type Vaginal    Palpation bulbocavernosus and perineal body had decreased movement    Strength weak squeeze, no lift                    PT Education - 04/12/20 1533    Education Details Pelvic floor moisturizers; soft tissue work to the pelvic floor tissue    Person(s) Educated Patient    Methods Explanation;Demonstration;Handout    Comprehension Verbalized understanding            PT Short Term Goals - 04/12/20 1539      PT SHORT TERM GOAL #1   Title Independent with initial HEP  Baseline --    Time 4    Period Weeks    Status New    Target Date 05/10/20        PT SHORT TERM GOAL #2   Title able to bulge the pelvic floor    Baseline --    Time 4    Period Weeks    Status New    Target Date 05/10/20      PT SHORT TERM GOAL #3   Title educated on vaginal health    Baseline --    Time 4    Period Weeks    Status New    Target Date 05/10/20      PT SHORT TERM GOAL #4   Title ---    Baseline --             PT Long Term Goals - 04/12/20 1540      PT LONG TERM GOAL #1   Title Independent with ongoing/advanced HEP    Time 12    Period Weeks    Status New    Target Date 07/05/20      PT LONG TERM GOAL #2   Title pelvic pressure during daily activities decreased >/= 75%    Baseline --    Time 12    Period Weeks    Status New    Target Date 07/05/20      PT LONG TERM GOAL #3   Title pelvic floor strength >/= 3/5 to reduce the pressure feeling    Baseline --    Time 12    Period Weeks    Status New    Target Date 07/05/20      PT LONG TERM GOAL #4   Title able to contract the pelvic floor and fully relax afterwards to improve coordination    Baseline --    Time 12    Period Weeks    Status New    Target Date 07/05/20      PT LONG TERM GOAL #5   Title ------    Baseline -----                  Plan - 04/12/20 1533    Clinical Impression Statement Patient is a 74 year old female with abdominal pain and pressure that started suddenly years ago. Patient reports the intermittent pain is at level 6/10. Patient has tenderness located in the lower abdominal and fascial restrictions. Pelvic floor strength is 2/5 with tightness located in the perineal body, bulbocavernosus, and puborectalis. Vulvar area is dry. She has a slight drop of the bladder when she tries to bear down. Patient is unable to bulge the pelvic floor. Patient will benefit from skilled therapy to elongate the pelvic floor tissue, be able to relax the tissue and reduce the pressure pain.    Personal Factors and Comorbidities Comorbidity 3+;Fitness     Comorbidities Bil. breast carcimoma; colon cancer 2006, Inguinal hernia; total abdominal hysterectomy with bil. salphenoophrectomy    Examination-Participation Restrictions Community Activity    Stability/Clinical Decision Making Evolving/Moderate complexity    Clinical Decision Making Low    Rehab Potential Excellent    PT Frequency 1x / week    PT Duration 12 weeks    PT Treatment/Interventions ADLs/Self Care Home Management;Biofeedback;Therapeutic activities;Therapeutic exercise;Neuromuscular re-education;Manual techniques;Patient/family education;Dry needling    PT Next Visit Plan manual work to the pelvic floor, elongation of pelvic floor with bulging, diaphragmatic breathing, fascial release of the lower abdomen  Consulted and Agree with Plan of Care Patient           Patient will benefit from skilled therapeutic intervention in order to improve the following deficits and impairments:  Decreased coordination, Increased fascial restricitons, Pain, Decreased strength  Visit Diagnosis: Muscle weakness (generalized) - Plan: PT plan of care cert/re-cert  Other lack of coordination - Plan: PT plan of care cert/re-cert  Other muscle spasm - Plan: PT plan of care cert/re-cert     Problem List Patient Active Problem List   Diagnosis Date Noted  . Abdominal pain, left upper quadrant   . Polyp of descending colon   . Chronic systolic heart failure ([place]) 05/21/2018  . Cardiomyopathy ([place])   . Hypertension 11/06/2017  . Complete rotator cuff rupture of left shoulder 09/21/2014  . Abdominal pain, epigastric 08/24/2013  . Belching 08/24/2013  . Other abnormal blood chemistry 01/21/2012  . Nonspecific (abnormal) findings on radiological and other examination of biliary tract 01/21/2012  . TIA (transient ischemic attack) 11/05/2011  . PVC's (premature ventricular contractions) 10/11/2011  . Nonspecific abnormal unspecified cardiovascular function study 12/20/2010  . Malignant  neoplasm of female breast ([place]) 08/23/2008  . HEMORRHOIDS 08/23/2008  . CONSTIPATION, CHRONIC 08/23/2008  . RECTAL BLEEDING 08/23/2008  . History of colon cancer 08/23/2008  . Malignant neoplasm of colon ([place]) 01/26/2005    Earlie Counts, PT 04/12/20 4:11 PM   [facility] [address] [place], [place], [address] Phone: [phone]   Fax:  [phone]  Name: Eileen [name] MRN: [id number] Date of Birth: Nov 17, 1945

## 2020-04-12 NOTE — Patient Instructions (Addendum)
Moisturizers  They are used in the vagina to hydrate the mucous membrane that make up the vaginal canal.  Designed to keep a more normal acid balance (ph)  Once placed in the vagina, it will last between two to three days.   Use 2-3 times per week at bedtime   Ingredients to avoid is glycerin and fragrance, can increase chance of infection  Should not be used just before sex due to causing irritation  Most are gels administered either in a tampon-shaped applicator or as a vaginal suppository. They are non-hormonal.   Types of Moisturizers(internal use)   Vitamin E vaginal suppositories- Whole foods, Amazon  Moist Again  Coconut oil- can break down condoms  Julva- (Do no use if on Tamoxifen) amazon  Yes moisturizer- amazon  NeuEve Silk , NeuEve Silver for menopausal or over 65 (if have severe vaginal atrophy or cancer treatments use NeuEve Silk for  1 month than move to [employer])- [employer], [place].com  Olive and Bee intimate cream- www.oliveandbee.com.au  Mae vaginal moisturizer- Amazon  Aloe     Creams to use externally on the Vulva area  Albertson's (good for for cancer patients that had radiation to the area)- [place] or [employer].FlyingBasics.com.br  V-magic cream - amazon  Julva-amazon  Vital "V [place] Yam salve ( help moisturize and help with thinning vulvar area, does have [place] by Irwin Brakeman labial moisturizer (Amazon,   Coconut or olive oil  aloe   Things to avoid in the vaginal area  Do not use things to irritate the vulvar area  No lotions just specialized creams for the vulva area- Neogyn, V-magic, No soaps; can use Aveeno or Calendula cleanser if needed. Must be gentle  No deodorants  No douches  Good to sleep without underwear to let the vaginal area to air out  No scrubbing: spread the lips to let warm water rinse over labias and pat dry  Lay in a  reclined position Place thumb in the vaginal canal.  Stroke the right side 20 times then the left side 20 times  3-4 times per week  [facility] [address] [place], [place] [address] Phone # [phone] Fax [phone]

## 2020-04-17 ENCOUNTER — Other Ambulatory Visit (HOSPITAL_BASED_OUTPATIENT_CLINIC_OR_DEPARTMENT_OTHER): Payer: Self-pay | Admitting: Cardiology

## 2020-04-17 DIAGNOSIS — I420 Dilated cardiomyopathy: Secondary | ICD-10-CM | POA: Diagnosis not present

## 2020-04-17 DIAGNOSIS — I10 Essential (primary) hypertension: Secondary | ICD-10-CM | POA: Diagnosis not present

## 2020-04-17 MED FILL — hydrALAZINE HCL 25 MG TABS: 25 | 30 days supply | Qty: 60 | Fill #0

## 2020-04-17 MED FILL — CARVEDILOL 12.5 MG TABLET: 12.5 | 15 days supply | Qty: 30 | Fill #2

## 2020-04-19 ENCOUNTER — Encounter: Payer: Self-pay | Admitting: Physical Therapy

## 2020-04-19 ENCOUNTER — Ambulatory Visit: Payer: Medicare Other | Admitting: Physical Therapy

## 2020-04-19 ENCOUNTER — Other Ambulatory Visit: Payer: Self-pay

## 2020-04-19 DIAGNOSIS — M62838 Other muscle spasm: Secondary | ICD-10-CM

## 2020-04-19 DIAGNOSIS — R278 Other lack of coordination: Secondary | ICD-10-CM

## 2020-04-19 DIAGNOSIS — I8312 Varicose veins of left lower extremity with inflammation: Secondary | ICD-10-CM | POA: Diagnosis not present

## 2020-04-19 DIAGNOSIS — M6281 Muscle weakness (generalized): Secondary | ICD-10-CM

## 2020-04-19 NOTE — Therapy (Signed)
[facility] [address], [place], [place], [address] Phone: [phone]   Fax:  [phone]  Physical Therapy Treatment  Patient Details  Name: Eileen [name] MRN: [id number] Date of Birth: 01-15-1946 Referring Provider (PT): Dr. Hale Bogus   Encounter Date: 04/19/2020   PT End of Session - 04/19/20 1226    Visit Number 2    Date for PT Re-Evaluation 07/05/20    Authorization Type Medicare    Authorization - Visit Number 2    Authorization - Number of Visits 10    PT Start Time 5956    PT Stop Time 1223    PT Time Calculation (min) 38 min    Activity Tolerance Patient tolerated treatment well    Behavior During Therapy [facility] for tasks assessed/performed           Past Medical History:  Diagnosis Date   Anemia    Axillary mass 7/99   Left   Barrett's esophagus    Blood transfusion without reported diagnosis    CARCINOMA, BREAST 1995   left   Cervical spinal stenosis    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   Headache(784.0)    HEMORRHOIDS    Hypertension    Personal history of chemotherapy    Personal history of radiation therapy     Past Surgical History:  Procedure Laterality Date   BIOPSY  08/10/2019   Procedure: BIOPSY;  Surgeon: Mauri Pole, MD;  [facility]: [facility];  Service: Endoscopy;;   BONE MARROW TRANSPLANT  1999   BREAST LUMPECTOMY Left 11/95   left breast   BUNIONECTOMY  3/09, 10/09   right and left   COLON SURGERY     COLONOSCOPY     COLONOSCOPY WITH PROPOFOL [name] 08/10/2019   Procedure: COLONOSCOPY WITH PROPOFOL;  Surgeon: Mauri Pole, MD;  [facility]: [facility];  Service: Endoscopy;  Laterality: [name];   ERCP  01/21/2012   Procedure: ENDOSCOPIC RETROGRADE CHOLANGIOPANCREATOGRAPHY (ERCP);  Surgeon: Lafayette Dragon, MD;  Location: [facility];  Service: Endoscopy;  Laterality: [name];   hemicolectomy  01/2005   right   HYSTEROSCOPY  08/30/94   INGUINAL  HERNIA REPAIR     LEFT HEART CATH AND CORONARY ANGIOGRAPHY [name] 05/21/2018   Procedure: LEFT HEART CATH AND CORONARY ANGIOGRAPHY;  Surgeon: Nelva Bush, MD;  Location: [facility];  Service: Cardiovascular;  Laterality: [name];   NECK MASS EXCISION Left 7/99   ROTATOR CUFF REPAIR Right 09/08/13   full tear   TOTAL ABDOMINAL HYSTERECTOMY W/ BILATERAL SALPINGOOPHORECTOMY  1996   TUNNELED VENOUS CATHETER PLACEMENT  9/98    There were no vitals filed for this visit.   Subjective Assessment - 04/19/20 1149    Subjective I feel the same after the initial evaluation. I feel increased pain with riding the recumbent bike    Patient Stated Goals reduce the pain    Currently in Pain? Yes    Pain Score 6     Pain Location Abdomen    Pain Orientation Lower    Pain Descriptors / Indicators Discomfort;Pressure    Pain Type Chronic pain    Pain Onset More than a month ago    Pain Frequency Intermittent    Aggravating Factors  recumbent bike    Pain Relieving Factors the vaginal moisturizer    Multiple Pain Sites No  [place] Adult PT Treatment/Exercise - 04/19/20 0001      Neuro Re-ed    Neuro Re-ed Details  diaphragmatic breathing to expand the lower rib cage then the abdomen  as a unit with tactile anc verbal cues to perform correctly      Lumbar Exercises: Stretches   Active Hamstring Stretch Right;Left;1 rep;30 seconds    Active Hamstring Stretch Limitations supine    Piriformis Stretch Right;Left;1 rep;30 seconds    Piriformis Stretch Limitations supine with pushing on knee    Other Lumbar Stretch Exercise butterfly 1 min      Manual Therapy   Manual Therapy Soft tissue mobilization    Soft tissue mobilization lower abdomen to release the tissue to reduce the fascial restrictions                    PT Short Term Goals - 04/19/20 1230      PT SHORT TERM GOAL #3   Title educated on vaginal health    Time 4     Period Weeks    Status Achieved             PT Long Term Goals - 04/12/20 1540      PT LONG TERM GOAL #1   Title Independent with ongoing/advanced HEP    Time 12    Period Weeks    Status New    Target Date 07/05/20      PT LONG TERM GOAL #2   Title pelvic pressure during daily activities decreased >/= 75%    Baseline --    Time 12    Period Weeks    Status New    Target Date 07/05/20      PT LONG TERM GOAL #3   Title pelvic floor strength >/= 3/5 to reduce the pressure feeling    Baseline --    Time 12    Period Weeks    Status New    Target Date 07/05/20      PT LONG TERM GOAL #4   Title able to contract the pelvic floor and fully relax afterwards to improve coordination    Baseline --    Time 12    Period Weeks    Status New    Target Date 07/05/20      PT LONG TERM GOAL #5   Title ------    Baseline -----                 Plan - 04/19/20 1226    Clinical Impression Statement Patient feels less pain since she has been using the vaginal moisturizer and doing the perineal massage. Patient has difficulty with expanding the lower rib cage and abdomen. Patient had reduction of the fascial restrictions inthe lower abdomen. Patient will benefit from skilled therapy to elongate the pelvic floor tissue, be able to relax the tissue and reduce the pressure pain.    Personal Factors and Comorbidities Comorbidity 3+;Fitness    Comorbidities Bil. breast carcimoma; colon cancer 2006, Inguinal hernia; total abdominal hysterectomy with bil. salphenoophrectomy    Examination-Participation Restrictions Community Activity    Stability/Clinical Decision Making Evolving/Moderate complexity    PT Frequency 1x / week    PT Duration 12 weeks    PT Treatment/Interventions ADLs/Self Care Home Management;Biofeedback;Therapeutic activities;Therapeutic exercise;Neuromuscular re-education;Manual techniques;Patient/family education;Dry needling    PT Next Visit Plan manual work to  the pelvic floor, elongation of pelvic floor with bulging, review diaphragmatic breathing; check on the stretches  PT Home Exercise Plan Access Code: [email or web address]: [email or web address]: 09/22/2021Prepared by: Gretta Cool Figure 4 Piriformis Stretch - 1 x daily - 7 x weekly - 1 sets - 2 reps - 30 sec holdSupine Hamstring Stretch - 1 x daily - 7 x weekly - 1 sets - 2 reps - 30 sec holdSupine Butterfly Groin Stretch - 1 x daily - 7 x weekly - 1 sets - 1 reps - 1 min holdSupine Diaphragmatic Breathing - 3 x daily - 7 x weekly - 1 sets - 10 reps    Recommended Other Services MD has signed the initial eval    Consulted and Agree with Plan of Care Patient           Patient will benefit from skilled therapeutic intervention in order to improve the following deficits and impairments:  Decreased coordination, Increased fascial restricitons, Pain, Decreased strength  Visit Diagnosis: Muscle weakness (generalized)  Other lack of coordination  Other muscle spasm     Problem List Patient Active Problem List   Diagnosis Date Noted   Abdominal pain, left upper quadrant    Polyp of descending colon    Chronic systolic heart failure ([place]) 05/21/2018   Cardiomyopathy ([place])    Hypertension 11/06/2017   Complete rotator cuff rupture of left shoulder 09/21/2014   Abdominal pain, epigastric 08/24/2013   Belching 08/24/2013   Other abnormal blood chemistry 01/21/2012   Nonspecific (abnormal) findings on radiological and other examination of biliary tract 01/21/2012   TIA (transient ischemic attack) 11/05/2011   PVC's (premature ventricular contractions) 10/11/2011   Nonspecific abnormal unspecified cardiovascular function study 12/20/2010   Malignant neoplasm of female breast ([place]) 08/23/2008   HEMORRHOIDS 08/23/2008   CONSTIPATION, CHRONIC 08/23/2008   RECTAL BLEEDING 08/23/2008   History of colon cancer 08/23/2008   Malignant neoplasm of colon  ([place]) 01/26/2005    Earlie Counts, PT 04/19/20 12:30 PM   [facility] [address] [place], [place], [address] Phone: [phone]   Fax:  [phone]  Name: Queen Abbett MRN: [id number] Date of Birth: [birth year]

## 2020-04-26 ENCOUNTER — Other Ambulatory Visit: Payer: Self-pay

## 2020-04-26 ENCOUNTER — Encounter: Payer: Self-pay | Admitting: Physical Therapy

## 2020-04-26 ENCOUNTER — Ambulatory Visit: Payer: Medicare Other | Admitting: Physical Therapy

## 2020-04-26 DIAGNOSIS — R278 Other lack of coordination: Secondary | ICD-10-CM

## 2020-04-26 DIAGNOSIS — M62838 Other muscle spasm: Secondary | ICD-10-CM

## 2020-04-26 DIAGNOSIS — M6281 Muscle weakness (generalized): Secondary | ICD-10-CM

## 2020-04-26 NOTE — Therapy (Signed)
[facility] [address], [place], [place], [address] Phone: [phone]   Fax:  [phone]  Physical Therapy Treatment  Patient Details  Name: Eileen [name] MRN: [id number] Date of Birth: 1946-04-16 Referring Provider (PT): Dr. Hale Bogus   Encounter Date: 04/26/2020   PT End of Session - 04/26/20 1240    Visit Number 3    Date for PT Re-Evaluation 07/05/20    Authorization Type Medicare    Authorization - Visit Number 3    Authorization - Number of Visits 10    PT Start Time 2993    PT Stop Time 1223    PT Time Calculation (min) 38 min    Activity Tolerance Patient tolerated treatment well    Behavior During Therapy [facility] for tasks assessed/performed           Past Medical History:  Diagnosis Date  . Anemia   . Axillary mass 7/99   Left  . Barrett's esophagus   . Blood transfusion without reported diagnosis   . CARCINOMA, BREAST 1995   left  . Cervical spinal stenosis   . 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  . Headache(784.0)   . HEMORRHOIDS   . Hypertension   . Personal history of chemotherapy   . Personal history of radiation therapy     Past Surgical History:  Procedure Laterality Date  . BIOPSY  08/10/2019   Procedure: BIOPSY;  Surgeon: Mauri Pole, MD;  Location: [facility];  Service: Endoscopy;;  . BONE MARROW TRANSPLANT  1999  . BREAST LUMPECTOMY Left 11/95   left breast  . BUNIONECTOMY  3/09, 10/09   right and left  . COLON SURGERY    . COLONOSCOPY    . COLONOSCOPY WITH PROPOFOL [name] 08/10/2019   Procedure: COLONOSCOPY WITH PROPOFOL;  Surgeon: Mauri Pole, MD;  [facility]: [facility];  Service: Endoscopy;  Laterality: [name];  . ERCP  01/21/2012   Procedure: ENDOSCOPIC RETROGRADE CHOLANGIOPANCREATOGRAPHY (ERCP);  Surgeon: Lafayette Dragon, MD;  Location: [facility];  Service: Endoscopy;  Laterality: [name];  . hemicolectomy  01/2005   right  . HYSTEROSCOPY  08/30/94  . INGUINAL  HERNIA REPAIR    . LEFT HEART CATH AND CORONARY ANGIOGRAPHY [name] 05/21/2018   Procedure: LEFT HEART CATH AND CORONARY ANGIOGRAPHY;  Surgeon: Nelva Bush, MD;  Location: [facility];  Service: Cardiovascular;  Laterality: [name];  . NECK MASS EXCISION Left 7/99  . ROTATOR CUFF REPAIR Right 09/08/13   full tear  . TOTAL ABDOMINAL HYSTERECTOMY W/ BILATERAL SALPINGOOPHORECTOMY  1996  . TUNNELED VENOUS CATHETER PLACEMENT  9/98    There were no vitals filed for this visit.   Subjective Assessment - 04/26/20 1143    Subjective I feel better.    Patient Stated Goals reduce the pain    Currently in Pain? 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 Hale Bogus    Onset Date/Surgical Date --   chronic   Prior Therapy no      Precautions   Precautions Other (comment)    Precaution Comments cancer      Restrictions   Weight Bearing Restrictions No      [place] residence      Prior Function   Level of Independence Independent    Leisure gym 5/week      Cognition   Overall Cognitive Status Within Functional Limits for tasks assessed      PROM   Right Hip External Rotation  40    Right Hip Internal Rotation  25    Left Hip External Rotation  30    Left Hip Internal Rotation  30      Strength   Right Hip Extension 4/5    Right Hip ABduction 4-/5    Left Hip Extension 4/5    Left Hip ABduction 4-/5                      Pelvic Floor Special Questions - 04/26/20  0001    Exam Type Deferred   deferred work to the pelvic floor today            [facility] Adult PT Treatment/Exercise - 04/26/20 0001      Manual Therapy   Manual Therapy Soft tissue mobilization;Myofascial release    Soft tissue mobilization circular and [place] massage to promote peristalic massage and reduce her abdominal pain    Myofascial Release tissue rolling to the abdomen, fascial release of the lower abdomen, release of the right abdominal wall along the descending  intestines, release of the  colon and mesenteric root                    PT Short Term Goals - 04/26/20 1243      PT SHORT TERM GOAL #1   Title Independent with initial HEP    Time 4    Period Weeks    Status Achieved      PT SHORT TERM GOAL #2   Title able to bulge the pelvic floor    Time 4    Period Weeks    Status Deferred      PT SHORT TERM GOAL #3   Title educated on vaginal health    Time 4    Period Weeks    Status Achieved             PT Long Term Goals - 04/26/20 1220      PT LONG TERM GOAL #1   Title Independent with ongoing/advanced HEP    Time 12    Period Weeks    Status Achieved      PT LONG TERM GOAL #2   Title pelvic pressure during daily activities decreased >/= 75%    Time 12    Period Weeks    Status Achieved      PT LONG TERM GOAL #3   Title pelvic floor strength >/= 3/5 to reduce the pressure feeling    Time 12    Period Weeks    Status Deferred      PT LONG TERM GOAL #4   Title able to contract the pelvic floor and fully relax afterwards to improve coordination    Time 12    Period Weeks    Status Deferred  Plan - 04/26/20 1240    Clinical Impression Statement Patient reports the pressure feeling in the pelvic area is 75% better. When she rides her bike she is not feeling the abdominal pain. Patient is happy with her progress and does not want work on the pelvic floor due to not having urinary leakasge. Patient has no fascial  restrictions in the abdomen since manual work has been perfromed. Patient is ready for discharge as per her request.    Personal Factors and Comorbidities Comorbidity 3+;Fitness    Comorbidities Bil. breast carcimoma; colon cancer 2006, Inguinal hernia; total abdominal hysterectomy with bil. salphenoophrectomy    Examination-Participation Restrictions Community Activity    Stability/Clinical Decision Making Evolving/Moderate complexity    Rehab Potential Excellent    PT Treatment/Interventions ADLs/Self Care Home Management;Biofeedback;Therapeutic activities;Therapeutic exercise;Neuromuscular re-education;Manual techniques;Patient/family education;Dry needling    PT Next Visit Plan Discharge to HEP    PT Home Exercise Plan Access code: [email or web address]    Consulted and Agree with Plan of Care Patient           Patient will benefit from skilled therapeutic intervention in order to improve the following deficits and impairments:  Decreased coordination, Increased fascial restricitons, Pain, Decreased strength  Visit Diagnosis: Muscle weakness (generalized)  Other lack of coordination  Other muscle spasm     Problem List Patient Active Problem List   Diagnosis Date Noted  . Abdominal pain, left upper quadrant   . Polyp of descending colon   . Chronic systolic heart failure ([place]) 05/21/2018  . Cardiomyopathy ([place])   . Hypertension 11/06/2017  . Complete rotator cuff rupture of left shoulder 09/21/2014  . Abdominal pain, epigastric 08/24/2013  . Belching 08/24/2013  . Other abnormal blood chemistry 01/21/2012  . Nonspecific (abnormal) findings on radiological and other examination of biliary tract 01/21/2012  . TIA (transient ischemic attack) 11/05/2011  . PVC's (premature ventricular contractions) 10/11/2011  . Nonspecific abnormal unspecified cardiovascular function study 12/20/2010  . Malignant neoplasm of female breast ([place]) 08/23/2008  . HEMORRHOIDS 08/23/2008  . CONSTIPATION,  CHRONIC 08/23/2008  . RECTAL BLEEDING 08/23/2008  . History of colon cancer 08/23/2008  . Malignant neoplasm of colon ([place]) 01/26/2005    Earlie Counts, PT 04/26/20 12:44 PM   [facility] [address] [place], [place], [address] Phone: [phone]   Fax:  [phone]  Name: Eileen [name] MRN: [id number] Date of Birth: 1945/11/13 PHYSICAL THERAPY DISCHARGE SUMMARY  Visits from Start of Care: 3  Current functional level related to goals / functional outcomes: See above.    Remaining deficits: See above    Education / Equipment: HEP Plan: Patient agrees to discharge.  Patient goals were partially met. Patient is being discharged due to being pleased with the current functional level.  Thank you for the referral.Sahara Fujimoto Pearline Cables, PT 04/26/20 12:45 PM  ?????

## 2020-05-01 MED FILL — AMLODIPINE BESYLATE 10 MG T: 10 | 90 days supply | Qty: 90 | Fill #1

## 2020-05-05 DIAGNOSIS — I10 Essential (primary) hypertension: Secondary | ICD-10-CM | POA: Diagnosis not present

## 2020-05-05 DIAGNOSIS — K219 Gastro-esophageal reflux disease without esophagitis: Secondary | ICD-10-CM | POA: Diagnosis not present

## 2020-05-05 DIAGNOSIS — E559 Vitamin D deficiency, unspecified: Secondary | ICD-10-CM | POA: Diagnosis not present

## 2020-05-05 DIAGNOSIS — E7800 Pure hypercholesterolemia, unspecified: Secondary | ICD-10-CM | POA: Diagnosis not present

## 2020-05-05 DIAGNOSIS — G2581 Restless legs syndrome: Secondary | ICD-10-CM | POA: Diagnosis not present

## 2020-05-05 DIAGNOSIS — R946 Abnormal results of thyroid function studies: Secondary | ICD-10-CM | POA: Diagnosis not present

## 2020-05-05 DIAGNOSIS — Z85038 Personal history of other malignant neoplasm of large intestine: Secondary | ICD-10-CM | POA: Diagnosis not present

## 2020-05-05 DIAGNOSIS — I428 Other cardiomyopathies: Secondary | ICD-10-CM | POA: Diagnosis not present

## 2020-05-05 DIAGNOSIS — Z23 Encounter for immunization: Secondary | ICD-10-CM | POA: Diagnosis not present

## 2020-05-05 DIAGNOSIS — Z853 Personal history of malignant neoplasm of breast: Secondary | ICD-10-CM | POA: Diagnosis not present

## 2020-05-05 DIAGNOSIS — R600 Localized edema: Secondary | ICD-10-CM | POA: Diagnosis not present

## 2020-05-09 ENCOUNTER — Telehealth: Payer: Self-pay

## 2020-05-09 NOTE — Telephone Encounter (Signed)
Pt calling to give update to Dr Sabra Heck on pelvic PT referral.  Spoke with pt. Pt reports feeling better and not feeling pressure anymore.  Would like to know what to do now?  Advised Dr Sabra Heck not in office until Thursday. Advised will review and return call with any recommendations. Pt agreeable.   Routing to Dr Sabra Heck.

## 2020-05-09 NOTE — Telephone Encounter (Signed)
Patient would like to update Dr. Sabra Heck on physical therapy rehab. Patient states she has been seen three times and is doing the recommended exercises at home. Patient states she is feeling a bit better.

## 2020-05-10 NOTE — Telephone Encounter (Signed)
Please advise pt she should finish her physical therapy as recommended by the therapist.  I do not need to see her for an appt until it's time for her routine follow up.  Thanks.

## 2020-05-10 NOTE — Telephone Encounter (Signed)
Spoke with pt. Pt given update per Dr Sabra Heck. Pt agreeable to make AEX appt after 11/2020. Pt states is going to transfer care to [facility].  Encounter closed.

## 2020-05-16 DIAGNOSIS — Z23 Encounter for immunization: Secondary | ICD-10-CM | POA: Diagnosis not present

## 2020-05-17 DIAGNOSIS — N281 Cyst of kidney, acquired: Secondary | ICD-10-CM | POA: Diagnosis not present

## 2020-05-17 DIAGNOSIS — I10 Essential (primary) hypertension: Secondary | ICD-10-CM | POA: Diagnosis not present

## 2020-05-17 DIAGNOSIS — I420 Dilated cardiomyopathy: Secondary | ICD-10-CM | POA: Diagnosis not present

## 2020-05-18 ENCOUNTER — Other Ambulatory Visit (HOSPITAL_BASED_OUTPATIENT_CLINIC_OR_DEPARTMENT_OTHER): Payer: Self-pay | Admitting: Cardiology

## 2020-05-18 MED FILL — CARVEDILOL 6.25 MG TABLET: 6.25 | 36 days supply | Qty: 90 | Fill #0

## 2020-05-18 MED FILL — hydrALAZINE HCL 25 MG TABS: 25 | 30 days supply | Qty: 60 | Fill #1

## 2020-05-18 MED FILL — CARVEDILOL 12.5 MG TABLET: 12.5 | 15 days supply | Qty: 30 | Fill #3

## 2020-05-19 MED FILL — LOSARTAN POTASSIUM 100 MG T: 100 | 90 days supply | Qty: 90 | Fill #1

## 2020-05-22 MED FILL — AMLODIPINE BESYLATE 10 MG T: 10 | 90 days supply | Qty: 90 | Fill #0

## 2020-05-22 MED FILL — hydrALAZINE HCL 25 MG TABS: 25 | 45 days supply | Qty: 90 | Fill #0

## 2020-05-26 MED FILL — VIT D2 1.25 MG (50,000 UNIT: 1.25 MG | 84 days supply | Qty: 12 | Fill #1

## 2020-05-29 DIAGNOSIS — Z20822 Contact with and (suspected) exposure to covid-19: Secondary | ICD-10-CM | POA: Diagnosis not present

## 2020-05-29 MED FILL — FUROSEMIDE 20 MG TAB: 20 | 30 days supply | Qty: 60 | Fill #1

## 2020-05-29 MED FILL — CARVEDILOL 12.5 MG TABLET: 12.5 | 15 days supply | Qty: 30 | Fill #4

## 2020-08-02 DIAGNOSIS — I10 Essential (primary) hypertension: Secondary | ICD-10-CM | POA: Diagnosis not present

## 2020-08-02 DIAGNOSIS — I420 Dilated cardiomyopathy: Secondary | ICD-10-CM | POA: Diagnosis not present

## 2020-08-21 DIAGNOSIS — L821 Other seborrheic keratosis: Secondary | ICD-10-CM | POA: Diagnosis not present

## 2020-08-21 DIAGNOSIS — L570 Actinic keratosis: Secondary | ICD-10-CM | POA: Diagnosis not present

## 2020-08-21 DIAGNOSIS — L723 Sebaceous cyst: Secondary | ICD-10-CM | POA: Diagnosis not present

## 2020-08-21 DIAGNOSIS — Z85828 Personal history of other malignant neoplasm of skin: Secondary | ICD-10-CM | POA: Diagnosis not present

## 2020-08-21 DIAGNOSIS — D2261 Melanocytic nevi of right upper limb, including shoulder: Secondary | ICD-10-CM | POA: Diagnosis not present

## 2020-10-05 DIAGNOSIS — M25551 Pain in right hip: Secondary | ICD-10-CM | POA: Diagnosis not present

## 2020-10-17 DIAGNOSIS — H2513 Age-related nuclear cataract, bilateral: Secondary | ICD-10-CM | POA: Diagnosis not present

## 2020-10-17 DIAGNOSIS — H353131 Nonexudative age-related macular degeneration, bilateral, early dry stage: Secondary | ICD-10-CM | POA: Diagnosis not present

## 2020-10-17 DIAGNOSIS — H04123 Dry eye syndrome of bilateral lacrimal glands: Secondary | ICD-10-CM | POA: Diagnosis not present

## 2020-10-25 ENCOUNTER — Inpatient Hospital Stay: Payer: Medicare Other

## 2020-10-25 ENCOUNTER — Inpatient Hospital Stay: Payer: Medicare Other | Attending: Oncology | Admitting: Oncology

## 2020-10-25 ENCOUNTER — Other Ambulatory Visit: Payer: Self-pay

## 2020-10-25 VITALS — BP 139/81 | HR 82 | Temp 97.6°F | Resp 20 | Ht 64.0 in | Wt 145.2 lb

## 2020-10-25 DIAGNOSIS — C189 Malignant neoplasm of colon, unspecified: Secondary | ICD-10-CM

## 2020-10-25 DIAGNOSIS — Z853 Personal history of malignant neoplasm of breast: Secondary | ICD-10-CM | POA: Insufficient documentation

## 2020-10-25 DIAGNOSIS — Z9221 Personal history of antineoplastic chemotherapy: Secondary | ICD-10-CM | POA: Diagnosis not present

## 2020-10-25 DIAGNOSIS — Z85038 Personal history of other malignant neoplasm of large intestine: Secondary | ICD-10-CM | POA: Diagnosis not present

## 2020-10-25 DIAGNOSIS — Z9484 Stem cells transplant status: Secondary | ICD-10-CM | POA: Diagnosis not present

## 2020-10-25 DIAGNOSIS — Z79899 Other long term (current) drug therapy: Secondary | ICD-10-CM | POA: Insufficient documentation

## 2020-10-25 LAB — CBC WITH DIFFERENTIAL (CANCER CENTER ONLY)
Abs Immature Granulocytes: 0.02 10*3/uL (ref 0.00–0.07)
Basophils Absolute: 0.1 10*3/uL (ref 0.0–0.1)
Basophils Relative: 1 %
Eosinophils Absolute: 0.1 10*3/uL (ref 0.0–0.5)
Eosinophils Relative: 1 %
HCT: 39.4 % (ref 36.0–46.0)
Hemoglobin: 12.5 g/dL (ref 12.0–15.0)
Immature Granulocytes: 0 %
Lymphocytes Relative: 21 %
Lymphs Abs: 2.3 10*3/uL (ref 0.7–4.0)
MCH: 28.2 pg (ref 26.0–34.0)
MCHC: 31.7 g/dL (ref 30.0–36.0)
MCV: 88.9 fL (ref 80.0–100.0)
Monocytes Absolute: 0.8 10*3/uL (ref 0.1–1.0)
Monocytes Relative: 8 %
Neutro Abs: 7.7 10*3/uL (ref 1.7–7.7)
Neutrophils Relative %: 69 %
Platelet Count: 231 10*3/uL (ref 150–400)
RBC: 4.43 MIL/uL (ref 3.87–5.11)
RDW: 13.2 % (ref 11.5–15.5)
WBC Count: 10.9 10*3/uL — ABNORMAL HIGH (ref 4.0–10.5)
nRBC: 0 % (ref 0.0–0.2)

## 2020-10-25 LAB — CMP (CANCER CENTER ONLY)
ALT: 28 U/L (ref 0–44)
AST: 23 U/L (ref 15–41)
Albumin: 3.9 g/dL (ref 3.5–5.0)
Alkaline Phosphatase: 75 U/L (ref 38–126)
Anion gap: 10 (ref 5–15)
BUN: 19 mg/dL (ref 8–23)
CO2: 26 mmol/L (ref 22–32)
Calcium: 8.8 mg/dL — ABNORMAL LOW (ref 8.9–10.3)
Chloride: 104 mmol/L (ref 98–111)
Creatinine: 0.98 mg/dL (ref 0.44–1.00)
GFR, Estimated: >60 mL/min (ref >60)
Glucose, Bld: 100 mg/dL — ABNORMAL HIGH (ref 70–99)
Potassium: 5 mmol/L (ref 3.5–5.1)
Sodium: 140 mmol/L (ref 135–145)
Total Bilirubin: 0.5 mg/dL (ref 0.3–1.2)
Total Protein: 6.8 g/dL (ref 6.5–8.1)

## 2020-10-25 LAB — CEA (IN HOUSE-CHCC): CEA (CHCC-In House): 2.81 ng/mL (ref 0.00–5.00)

## 2020-10-25 NOTE — Progress Notes (Signed)
Hematology and Oncology Follow Up Visit  Eileen [name] [id number] 1946/05/21 75 y.o. 10/25/2020 1:10 PM Shirline Frees, MDHarris, [name], MD    Principle Diagnosis:  75 year old woman with:    1.  Locally advanced breast cancer that is currently in remission after initially diagnosed in 1995.  She received standard therapy as well as high-dose chemotherapy with stem cell transplant and currently in remission since 1999.     2.  Stage IIa colon cancer diagnosed in 2006.   Prior Therapy: 1. She is status post lumpectomy and lymph node dissection followed by [facility] and radiation therapy. 2. She is status post 4 cycles of Adriamycin and Taxotere followed by stem cell transplantation at [facility] on 08/16/1997 due to recurrence and the left supraclavicular lymph node. Her tumor is ER negative PR positive. 3. She is status post right colectomy for stage IIA adenocarcinoma of the colon in October of 2006. She is status post 10 cycles of FOLFOX concluded in 06/2005 4. Femara taken between 2001 and 2017.   Current therapy: Active surveillance.  Interim History:  Eileen [name] is here for a follow-up evaluation.  Since last visit, she reports feeling well without any major complaints.  He denies any recent hospitalizations or illnesses.  He denies any bone pain or pathological fractures.  Performance status quality of life remain excellent.   Medications: Unchanged on review. Current Outpatient Medications  Medication Sig Dispense Refill  . acetaminophen (TYLENOL) 500 MG tablet Take 500 mg by mouth every 6 (six) hours as needed for moderate pain or headache.    Marland Kitchen amLODipine (NORVASC) 10 MG tablet Take 10 mg by mouth daily.    Marland Kitchen b complex vitamins tablet Take 1 tablet by mouth 2 (two) times a week.     . Calcium Carb-Cholecalciferol (CALCIUM 500+D PO) Take 1 tablet by mouth daily.    . carvedilol (COREG) 12.5 MG tablet Take 12.5 mg by mouth daily.    . carvedilol (COREG) 6.25 MG tablet Take  by mouth.    . losartan (COZAAR) 100 MG tablet Take 1 tablet (100 mg total) by mouth daily. 90 tablet 3  . magnesium oxide (MAG-OX) 400 MG tablet Take by mouth.    . Multiple Minerals (CALCIUM-MAGNESIUM-ZINC) TABS Take 1 tablet by mouth 2 (two) times a week.     . Multiple Vitamin (MULTIVITAMIN WITH MINERALS) TABS tablet Take 1 tablet by mouth daily.    . vitamin B-12 (CYANOCOBALAMIN) 500 MCG tablet Take 500 mcg by mouth See admin instructions. Take 500 mcg 5 days weekly, takes on non b complex days    . vitamin C (ASCORBIC ACID) 500 MG tablet Take 500 mg by mouth 2 (two) times daily.     . Vitamin D, Ergocalciferol, (DRISDOL) 50000 UNITS CAPS Take 50,000 Units by mouth every Wednesday.     . vitamin E 400 UNIT capsule Take 400 Units by mouth daily.     No current facility-administered medications for this visit.     Allergies:  Allergies  Allergen Reactions  . Levofloxacin Other (See Comments)    Red streaks to IV site when IV dose infusing and arm feeling very irritated, throbbing, painful  . Tape     Can use paper tape, adhesive causes redness       Physical Exam:  Blood pressure 139/81, pulse 82, temperature 97.6 F (36.4 C), temperature source Temporal, resp. rate 20, height 5\' 4"  (1.626 m), weight 145 lb 3.2 oz (65.9 kg), last menstrual period 07/30/1995, SpO2 100 %.  ECOG: 1    General appearance: Comfortable appearing without any discomfort Head: Normocephalic without any trauma Oropharynx: Mucous membranes are moist and pink without any thrush or ulcers. Eyes: Pupils are equal and round reactive to light. Lymph nodes: No cervical, supraclavicular, inguinal or axillary lymphadenopathy.   Heart:regular rate and rhythm.  S1 and S2 without leg edema. Lung: Clear without any rhonchi or wheezes.  No dullness to percussion. Abdomin: Soft, nontender, nondistended with good bowel sounds.  No hepatosplenomegaly. Musculoskeletal: No joint deformity or effusion.  Full  range of motion noted. Neurological: No deficits noted on motor, sensory and deep tendon reflex exam. Skin: No petechial rash or dryness.  Appeared moist.    Lab Results: Lab Results  Component Value Date   WBC 7.7 10/26/2019   HGB 12.5 10/26/2019   HCT 39.6 10/26/2019   MCV 89.4 10/26/2019   PLT 226 10/26/2019     Chemistry      Component Value Date/Time   NA 141 10/26/2019 1145   NA 142 03/31/2019 1320   NA 142 03/12/2017 1116   K 4.8 10/26/2019 1145   K 4.2 03/12/2017 1116   CL 107 10/26/2019 1145   CL 100 03/18/2012 1422   CO2 27 10/26/2019 1145   CO2 25 03/12/2017 1116   BUN 20 10/26/2019 1145   BUN 15 03/31/2019 1320   BUN 18.8 03/12/2017 1116   CREATININE 0.84 10/26/2019 1145   CREATININE 0.8 03/12/2017 1116      Component Value Date/Time   CALCIUM 9.3 10/26/2019 1145   CALCIUM 9.7 03/12/2017 1116   ALKPHOS 66 10/26/2019 1145   ALKPHOS 71 03/12/2017 1116   AST 23 10/26/2019 1145   AST 27 03/12/2017 1116   ALT 26 10/26/2019 1145   ALT 32 03/12/2017 1116   BILITOT 0.5 10/26/2019 1145   BILITOT 0.64 03/12/2017 1116         Impression and Plan:  75 year old woman with:   1.  Colon cancer is currently in remission after initial diagnosis in 2006.  She presented with stage II at that time.  Her disease status was updated at this time and she has no evidence suggest relapsed disease.  Risk of relapse is very low at this time I recommended continued colonoscopy only.   2. Breast cancer: She is up-to-date on her mammography which I recommended continuing.  No clinical signs symptoms of disease relapse.  3.  Survivorship issues: She has been exposed to high-dose chemotherapy and stem cell transplant which places her at a high risk for cardiovascular disease as well as hematological malignancies.  Her annual laboratory testing has been within normal range without any evidence to suggest hematological disorder.  4. Follow-up: In 12 months for a  follow-up.  20  minutes were spent on this visit.  The time was dedicated to reviewing disease status, risk of relapse, reviewing laboratory data and future plan of care discussion.   Zola Button, MD 3/30/20221:10 PM

## 2020-11-01 DIAGNOSIS — M79605 Pain in left leg: Secondary | ICD-10-CM | POA: Diagnosis not present

## 2020-11-02 DIAGNOSIS — I10 Essential (primary) hypertension: Secondary | ICD-10-CM | POA: Diagnosis not present

## 2020-11-02 DIAGNOSIS — I420 Dilated cardiomyopathy: Secondary | ICD-10-CM | POA: Diagnosis not present

## 2020-11-14 DIAGNOSIS — Z85038 Personal history of other malignant neoplasm of large intestine: Secondary | ICD-10-CM | POA: Diagnosis not present

## 2020-11-14 DIAGNOSIS — Z0000 Encounter for general adult medical examination without abnormal findings: Secondary | ICD-10-CM | POA: Diagnosis not present

## 2020-11-14 DIAGNOSIS — E559 Vitamin D deficiency, unspecified: Secondary | ICD-10-CM | POA: Diagnosis not present

## 2020-11-14 DIAGNOSIS — R202 Paresthesia of skin: Secondary | ICD-10-CM | POA: Diagnosis not present

## 2020-11-14 DIAGNOSIS — E7800 Pure hypercholesterolemia, unspecified: Secondary | ICD-10-CM | POA: Diagnosis not present

## 2020-11-14 DIAGNOSIS — I428 Other cardiomyopathies: Secondary | ICD-10-CM | POA: Diagnosis not present

## 2020-11-14 DIAGNOSIS — Z9484 Stem cells transplant status: Secondary | ICD-10-CM | POA: Diagnosis not present

## 2020-11-14 DIAGNOSIS — Z853 Personal history of malignant neoplasm of breast: Secondary | ICD-10-CM | POA: Diagnosis not present

## 2020-11-14 DIAGNOSIS — M25512 Pain in left shoulder: Secondary | ICD-10-CM | POA: Diagnosis not present

## 2020-11-14 DIAGNOSIS — I10 Essential (primary) hypertension: Secondary | ICD-10-CM | POA: Diagnosis not present

## 2020-11-14 DIAGNOSIS — R946 Abnormal results of thyroid function studies: Secondary | ICD-10-CM | POA: Diagnosis not present

## 2020-11-20 DIAGNOSIS — I4891 Unspecified atrial fibrillation: Secondary | ICD-10-CM | POA: Diagnosis not present

## 2020-11-20 DIAGNOSIS — M25512 Pain in left shoulder: Secondary | ICD-10-CM | POA: Diagnosis not present

## 2020-11-20 DIAGNOSIS — I420 Dilated cardiomyopathy: Secondary | ICD-10-CM | POA: Diagnosis not present

## 2020-11-20 DIAGNOSIS — M12812 Other specific arthropathies, not elsewhere classified, left shoulder: Secondary | ICD-10-CM | POA: Diagnosis not present

## 2020-11-20 DIAGNOSIS — M7542 Impingement syndrome of left shoulder: Secondary | ICD-10-CM | POA: Diagnosis not present

## 2020-11-20 DIAGNOSIS — I10 Essential (primary) hypertension: Secondary | ICD-10-CM | POA: Diagnosis not present

## 2020-11-26 DIAGNOSIS — Z9012 Acquired absence of left breast and nipple: Secondary | ICD-10-CM | POA: Diagnosis not present

## 2020-11-26 DIAGNOSIS — Z85038 Personal history of other malignant neoplasm of large intestine: Secondary | ICD-10-CM | POA: Diagnosis not present

## 2020-11-26 DIAGNOSIS — Z853 Personal history of malignant neoplasm of breast: Secondary | ICD-10-CM | POA: Diagnosis not present

## 2020-11-26 DIAGNOSIS — I4519 Other right bundle-branch block: Secondary | ICD-10-CM | POA: Diagnosis not present

## 2020-11-26 DIAGNOSIS — Z91048 Other nonmedicinal substance allergy status: Secondary | ICD-10-CM | POA: Diagnosis not present

## 2020-11-26 DIAGNOSIS — I10 Essential (primary) hypertension: Secondary | ICD-10-CM | POA: Diagnosis not present

## 2020-11-26 DIAGNOSIS — Z881 Allergy status to other antibiotic agents status: Secondary | ICD-10-CM | POA: Diagnosis not present

## 2020-11-26 DIAGNOSIS — R079 Chest pain, unspecified: Secondary | ICD-10-CM | POA: Diagnosis not present

## 2020-11-26 DIAGNOSIS — I4510 Unspecified right bundle-branch block: Secondary | ICD-10-CM | POA: Diagnosis not present

## 2020-11-26 DIAGNOSIS — I6789 Other cerebrovascular disease: Secondary | ICD-10-CM | POA: Diagnosis not present

## 2020-11-26 DIAGNOSIS — Z886 Allergy status to analgesic agent status: Secondary | ICD-10-CM | POA: Diagnosis not present

## 2020-11-26 DIAGNOSIS — R0789 Other chest pain: Secondary | ICD-10-CM | POA: Diagnosis not present

## 2020-11-26 DIAGNOSIS — Z90711 Acquired absence of uterus with remaining cervical stump: Secondary | ICD-10-CM | POA: Diagnosis not present

## 2020-11-26 DIAGNOSIS — Z8679 Personal history of other diseases of the circulatory system: Secondary | ICD-10-CM | POA: Diagnosis not present

## 2020-11-26 DIAGNOSIS — I4891 Unspecified atrial fibrillation: Secondary | ICD-10-CM | POA: Diagnosis not present

## 2020-11-26 DIAGNOSIS — Z87891 Personal history of nicotine dependence: Secondary | ICD-10-CM | POA: Diagnosis not present

## 2020-11-26 DIAGNOSIS — R519 Headache, unspecified: Secondary | ICD-10-CM | POA: Diagnosis not present

## 2020-11-26 DIAGNOSIS — Z79899 Other long term (current) drug therapy: Secondary | ICD-10-CM | POA: Diagnosis not present

## 2020-11-26 DIAGNOSIS — Z888 Allergy status to other drugs, medicaments and biological substances status: Secondary | ICD-10-CM | POA: Diagnosis not present

## 2020-11-28 DIAGNOSIS — I420 Dilated cardiomyopathy: Secondary | ICD-10-CM | POA: Diagnosis not present

## 2020-11-28 DIAGNOSIS — I4891 Unspecified atrial fibrillation: Secondary | ICD-10-CM | POA: Diagnosis not present

## 2020-11-28 DIAGNOSIS — I10 Essential (primary) hypertension: Secondary | ICD-10-CM | POA: Diagnosis not present

## 2020-11-29 ENCOUNTER — Telehealth: Payer: Self-pay | Admitting: Oncology

## 2020-11-29 NOTE — Telephone Encounter (Signed)
Scheduled appts per 5/3 sch msg. Pt aware.  

## 2020-12-01 DIAGNOSIS — R208 Other disturbances of skin sensation: Secondary | ICD-10-CM | POA: Diagnosis not present

## 2020-12-04 DIAGNOSIS — I420 Dilated cardiomyopathy: Secondary | ICD-10-CM | POA: Diagnosis not present

## 2020-12-04 DIAGNOSIS — I4891 Unspecified atrial fibrillation: Secondary | ICD-10-CM | POA: Diagnosis not present

## 2020-12-04 DIAGNOSIS — I10 Essential (primary) hypertension: Secondary | ICD-10-CM | POA: Diagnosis not present

## 2020-12-12 DIAGNOSIS — I420 Dilated cardiomyopathy: Secondary | ICD-10-CM | POA: Diagnosis not present

## 2020-12-12 DIAGNOSIS — I4819 Other persistent atrial fibrillation: Secondary | ICD-10-CM | POA: Diagnosis not present

## 2020-12-12 DIAGNOSIS — I10 Essential (primary) hypertension: Secondary | ICD-10-CM | POA: Diagnosis not present

## 2020-12-14 DIAGNOSIS — I4819 Other persistent atrial fibrillation: Secondary | ICD-10-CM | POA: Insufficient documentation

## 2020-12-15 ENCOUNTER — Other Ambulatory Visit: Payer: Self-pay | Admitting: Family Medicine

## 2020-12-15 DIAGNOSIS — Z1231 Encounter for screening mammogram for malignant neoplasm of breast: Secondary | ICD-10-CM

## 2021-01-01 DIAGNOSIS — I4819 Other persistent atrial fibrillation: Secondary | ICD-10-CM | POA: Diagnosis not present

## 2021-01-01 DIAGNOSIS — I10 Essential (primary) hypertension: Secondary | ICD-10-CM | POA: Diagnosis not present

## 2021-01-01 DIAGNOSIS — R0602 Shortness of breath: Secondary | ICD-10-CM | POA: Diagnosis not present

## 2021-01-01 DIAGNOSIS — I420 Dilated cardiomyopathy: Secondary | ICD-10-CM | POA: Diagnosis not present

## 2021-01-02 DIAGNOSIS — M12812 Other specific arthropathies, not elsewhere classified, left shoulder: Secondary | ICD-10-CM | POA: Diagnosis not present

## 2021-01-08 DIAGNOSIS — M19012 Primary osteoarthritis, left shoulder: Secondary | ICD-10-CM | POA: Diagnosis not present

## 2021-01-08 DIAGNOSIS — M12812 Other specific arthropathies, not elsewhere classified, left shoulder: Secondary | ICD-10-CM | POA: Diagnosis not present

## 2021-01-08 DIAGNOSIS — M25522 Pain in left elbow: Secondary | ICD-10-CM | POA: Diagnosis not present

## 2021-01-09 DIAGNOSIS — G6289 Other specified polyneuropathies: Secondary | ICD-10-CM | POA: Diagnosis not present

## 2021-01-09 DIAGNOSIS — Z7901 Long term (current) use of anticoagulants: Secondary | ICD-10-CM | POA: Diagnosis not present

## 2021-01-11 DIAGNOSIS — R6889 Other general symptoms and signs: Secondary | ICD-10-CM | POA: Diagnosis not present

## 2021-01-11 DIAGNOSIS — G629 Polyneuropathy, unspecified: Secondary | ICD-10-CM | POA: Diagnosis not present

## 2021-01-11 DIAGNOSIS — R748 Abnormal levels of other serum enzymes: Secondary | ICD-10-CM | POA: Diagnosis not present

## 2021-01-12 ENCOUNTER — Other Ambulatory Visit: Payer: Self-pay

## 2021-01-12 ENCOUNTER — Ambulatory Visit
Admission: RE | Admit: 2021-01-12 | Discharge: 2021-01-12 | Disposition: A | Discharge status: Home / Self Care | Payer: Medicare Other | Source: Ambulatory Visit | Attending: Family Medicine | Admitting: Family Medicine

## 2021-01-12 DIAGNOSIS — Z1231 Encounter for screening mammogram for malignant neoplasm of breast: Secondary | ICD-10-CM

## 2021-01-17 DIAGNOSIS — L578 Other skin changes due to chronic exposure to nonionizing radiation: Secondary | ICD-10-CM | POA: Diagnosis not present

## 2021-01-17 DIAGNOSIS — C44629 Squamous cell carcinoma of skin of left upper limb, including shoulder: Secondary | ICD-10-CM | POA: Diagnosis not present

## 2021-01-17 DIAGNOSIS — L821 Other seborrheic keratosis: Secondary | ICD-10-CM | POA: Diagnosis not present

## 2021-01-17 DIAGNOSIS — M19012 Primary osteoarthritis, left shoulder: Secondary | ICD-10-CM | POA: Diagnosis not present

## 2021-01-30 DIAGNOSIS — M19012 Primary osteoarthritis, left shoulder: Secondary | ICD-10-CM | POA: Diagnosis not present

## 2021-01-30 DIAGNOSIS — G5793 Unspecified mononeuropathy of bilateral lower limbs: Secondary | ICD-10-CM | POA: Diagnosis not present

## 2021-01-30 DIAGNOSIS — E538 Deficiency of other specified B group vitamins: Secondary | ICD-10-CM | POA: Diagnosis not present

## 2021-01-30 DIAGNOSIS — R202 Paresthesia of skin: Secondary | ICD-10-CM | POA: Diagnosis not present

## 2021-02-01 DIAGNOSIS — Z01812 Encounter for preprocedural laboratory examination: Secondary | ICD-10-CM | POA: Diagnosis not present

## 2021-02-01 DIAGNOSIS — Z7901 Long term (current) use of anticoagulants: Secondary | ICD-10-CM | POA: Diagnosis not present

## 2021-02-01 DIAGNOSIS — I4819 Other persistent atrial fibrillation: Secondary | ICD-10-CM | POA: Diagnosis not present

## 2021-02-02 DIAGNOSIS — Z888 Allergy status to other drugs, medicaments and biological substances status: Secondary | ICD-10-CM | POA: Diagnosis not present

## 2021-02-02 DIAGNOSIS — Z87891 Personal history of nicotine dependence: Secondary | ICD-10-CM | POA: Diagnosis not present

## 2021-02-02 DIAGNOSIS — I340 Nonrheumatic mitral (valve) insufficiency: Secondary | ICD-10-CM | POA: Diagnosis not present

## 2021-02-02 DIAGNOSIS — Z79899 Other long term (current) drug therapy: Secondary | ICD-10-CM | POA: Diagnosis not present

## 2021-02-02 DIAGNOSIS — I4819 Other persistent atrial fibrillation: Secondary | ICD-10-CM | POA: Diagnosis not present

## 2021-02-02 DIAGNOSIS — Z91048 Other nonmedicinal substance allergy status: Secondary | ICD-10-CM | POA: Diagnosis not present

## 2021-02-02 DIAGNOSIS — Z85038 Personal history of other malignant neoplasm of large intestine: Secondary | ICD-10-CM | POA: Diagnosis not present

## 2021-02-02 DIAGNOSIS — I10 Essential (primary) hypertension: Secondary | ICD-10-CM | POA: Diagnosis not present

## 2021-02-02 DIAGNOSIS — K219 Gastro-esophageal reflux disease without esophagitis: Secondary | ICD-10-CM | POA: Diagnosis not present

## 2021-02-02 DIAGNOSIS — I9589 Other hypotension: Secondary | ICD-10-CM | POA: Diagnosis not present

## 2021-02-02 DIAGNOSIS — I517 Cardiomegaly: Secondary | ICD-10-CM | POA: Diagnosis not present

## 2021-02-02 DIAGNOSIS — I420 Dilated cardiomyopathy: Secondary | ICD-10-CM | POA: Diagnosis not present

## 2021-02-02 DIAGNOSIS — Z9481 Bone marrow transplant status: Secondary | ICD-10-CM | POA: Diagnosis not present

## 2021-02-02 DIAGNOSIS — C50912 Malignant neoplasm of unspecified site of left female breast: Secondary | ICD-10-CM | POA: Diagnosis not present

## 2021-02-02 DIAGNOSIS — Z7982 Long term (current) use of aspirin: Secondary | ICD-10-CM | POA: Diagnosis not present

## 2021-02-02 DIAGNOSIS — Z8249 Family history of ischemic heart disease and other diseases of the circulatory system: Secondary | ICD-10-CM | POA: Diagnosis not present

## 2021-02-02 DIAGNOSIS — Z79811 Long term (current) use of aromatase inhibitors: Secondary | ICD-10-CM | POA: Diagnosis not present

## 2021-02-02 DIAGNOSIS — I088 Other rheumatic multiple valve diseases: Secondary | ICD-10-CM | POA: Diagnosis not present

## 2021-02-02 DIAGNOSIS — Z881 Allergy status to other antibiotic agents status: Secondary | ICD-10-CM | POA: Diagnosis not present

## 2021-02-02 DIAGNOSIS — Z7901 Long term (current) use of anticoagulants: Secondary | ICD-10-CM | POA: Diagnosis not present

## 2021-02-02 DIAGNOSIS — I513 Intracardiac thrombosis, not elsewhere classified: Secondary | ICD-10-CM | POA: Diagnosis not present

## 2021-02-10 DIAGNOSIS — Z87892 Personal history of anaphylaxis: Secondary | ICD-10-CM | POA: Diagnosis not present

## 2021-02-10 DIAGNOSIS — M7989 Other specified soft tissue disorders: Secondary | ICD-10-CM | POA: Diagnosis not present

## 2021-02-10 DIAGNOSIS — Z883 Allergy status to other anti-infective agents status: Secondary | ICD-10-CM | POA: Diagnosis not present

## 2021-02-10 DIAGNOSIS — Z853 Personal history of malignant neoplasm of breast: Secondary | ICD-10-CM | POA: Diagnosis not present

## 2021-02-10 DIAGNOSIS — Z888 Allergy status to other drugs, medicaments and biological substances status: Secondary | ICD-10-CM | POA: Diagnosis not present

## 2021-02-10 DIAGNOSIS — Z9889 Other specified postprocedural states: Secondary | ICD-10-CM | POA: Diagnosis not present

## 2021-02-10 DIAGNOSIS — Z91048 Other nonmedicinal substance allergy status: Secondary | ICD-10-CM | POA: Diagnosis not present

## 2021-02-10 DIAGNOSIS — Z79899 Other long term (current) drug therapy: Secondary | ICD-10-CM | POA: Diagnosis not present

## 2021-02-10 DIAGNOSIS — Z87891 Personal history of nicotine dependence: Secondary | ICD-10-CM | POA: Diagnosis not present

## 2021-02-10 DIAGNOSIS — I10 Essential (primary) hypertension: Secondary | ICD-10-CM | POA: Diagnosis not present

## 2021-02-10 DIAGNOSIS — Z7902 Long term (current) use of antithrombotics/antiplatelets: Secondary | ICD-10-CM | POA: Diagnosis not present

## 2021-02-10 DIAGNOSIS — Z5189 Encounter for other specified aftercare: Secondary | ICD-10-CM | POA: Diagnosis not present

## 2021-02-10 DIAGNOSIS — Z85038 Personal history of other malignant neoplasm of large intestine: Secondary | ICD-10-CM | POA: Diagnosis not present

## 2021-02-10 DIAGNOSIS — I4891 Unspecified atrial fibrillation: Secondary | ICD-10-CM | POA: Diagnosis not present

## 2021-02-10 DIAGNOSIS — Z79811 Long term (current) use of aromatase inhibitors: Secondary | ICD-10-CM | POA: Diagnosis not present

## 2021-02-13 ENCOUNTER — Ambulatory Visit: Payer: Medicare Other

## 2021-02-19 DIAGNOSIS — I4891 Unspecified atrial fibrillation: Secondary | ICD-10-CM | POA: Diagnosis not present

## 2021-02-19 DIAGNOSIS — I4892 Unspecified atrial flutter: Secondary | ICD-10-CM | POA: Diagnosis not present

## 2021-02-21 DIAGNOSIS — I428 Other cardiomyopathies: Secondary | ICD-10-CM | POA: Diagnosis not present

## 2021-02-21 DIAGNOSIS — I10 Essential (primary) hypertension: Secondary | ICD-10-CM | POA: Diagnosis not present

## 2021-02-21 DIAGNOSIS — I4819 Other persistent atrial fibrillation: Secondary | ICD-10-CM | POA: Diagnosis not present

## 2021-03-21 DIAGNOSIS — I10 Essential (primary) hypertension: Secondary | ICD-10-CM | POA: Diagnosis not present

## 2021-03-21 DIAGNOSIS — I428 Other cardiomyopathies: Secondary | ICD-10-CM | POA: Diagnosis not present

## 2021-03-21 DIAGNOSIS — I4819 Other persistent atrial fibrillation: Secondary | ICD-10-CM | POA: Diagnosis not present

## 2021-04-03 DIAGNOSIS — I4819 Other persistent atrial fibrillation: Secondary | ICD-10-CM | POA: Diagnosis not present

## 2021-04-03 DIAGNOSIS — I428 Other cardiomyopathies: Secondary | ICD-10-CM | POA: Diagnosis not present

## 2021-04-04 DIAGNOSIS — I8311 Varicose veins of right lower extremity with inflammation: Secondary | ICD-10-CM | POA: Diagnosis not present

## 2021-04-10 DIAGNOSIS — Z23 Encounter for immunization: Secondary | ICD-10-CM | POA: Diagnosis not present

## 2021-04-18 DIAGNOSIS — L821 Other seborrheic keratosis: Secondary | ICD-10-CM | POA: Diagnosis not present

## 2021-04-18 DIAGNOSIS — C44629 Squamous cell carcinoma of skin of left upper limb, including shoulder: Secondary | ICD-10-CM | POA: Diagnosis not present

## 2021-04-24 ENCOUNTER — Ambulatory Visit (HOSPITAL_BASED_OUTPATIENT_CLINIC_OR_DEPARTMENT_OTHER): Payer: Medicare Other | Admitting: Obstetrics & Gynecology

## 2021-04-25 ENCOUNTER — Ambulatory Visit (INDEPENDENT_AMBULATORY_CARE_PROVIDER_SITE_OTHER): Payer: Medicare Other | Admitting: Obstetrics & Gynecology

## 2021-04-25 ENCOUNTER — Encounter (HOSPITAL_BASED_OUTPATIENT_CLINIC_OR_DEPARTMENT_OTHER): Payer: Self-pay | Admitting: Obstetrics & Gynecology

## 2021-04-25 ENCOUNTER — Other Ambulatory Visit: Payer: Self-pay

## 2021-04-25 VITALS — BP 184/88 | HR 71 | Ht 64.0 in | Wt 148.0 lb

## 2021-04-25 DIAGNOSIS — M8588 Other specified disorders of bone density and structure, other site: Secondary | ICD-10-CM

## 2021-04-25 DIAGNOSIS — Z01419 Encounter for gynecological examination (general) (routine) without abnormal findings: Secondary | ICD-10-CM

## 2021-04-25 DIAGNOSIS — C50919 Malignant neoplasm of unspecified site of unspecified female breast: Secondary | ICD-10-CM

## 2021-04-25 DIAGNOSIS — Z85038 Personal history of other malignant neoplasm of large intestine: Secondary | ICD-10-CM | POA: Diagnosis not present

## 2021-04-25 DIAGNOSIS — Z780 Asymptomatic menopausal state: Secondary | ICD-10-CM | POA: Diagnosis not present

## 2021-04-25 DIAGNOSIS — M8580 Other specified disorders of bone density and structure, unspecified site: Secondary | ICD-10-CM

## 2021-04-25 DIAGNOSIS — Z853 Personal history of malignant neoplasm of breast: Secondary | ICD-10-CM

## 2021-04-25 DIAGNOSIS — Z90710 Acquired absence of both cervix and uterus: Secondary | ICD-10-CM | POA: Diagnosis not present

## 2021-04-25 MED ORDER — CARVEDILOL 25 MG PO TABS: 25.0000 mg | ORAL_TABLET | Freq: Every day | ORAL | Status: DC

## 2021-04-25 NOTE — Progress Notes (Signed)
75 y.o. G3P2 Married White or Caucasian female here for breast and pelvic exam.  I am also following her for h/o breast and colon cancer.  Pt has hx of abdominal pain that has been intermittent for the past several years.  Has done colonoscopy (hx of colon cancer), CT and pelvic ultrasound.  Had last episode 8/15 and it lasted for five days.    H/o afib this past year.  On several new medications.  Had cardiac ablation done 02/02/2021.  On eliquis.  She also has a loop recorder.    Denies vaginal bleeding.  Patient's last menstrual period was 07/30/1995.          Sexually active: No.  H/O STD:  no  Health Maintenance: PCP:  Dr. Kenton Kingfisher.  Last wellness appt was 08/2020.  Did blood work at that appt:  yes Vaccines are up to date:  has not done shingles vaccination Colonoscopy:  08/10/2019, Dr. Silverio Decamp MMG:  01/12/2021 Negative BMD:  03/03/2018, osteopenia Last pap smear:  03/31/2019 Negative.   H/o abnormal pap smear:  no   reports that she quit smoking about 30 years ago. Her smoking use included cigarettes. She has never used smokeless tobacco. She reports that she does not currently use alcohol. She reports that she does not use drugs.  Past Medical History:  Diagnosis Date   Anemia    Axillary mass 7/99   Left   Barrett's esophagus    Blood transfusion without reported diagnosis    CARCINOMA, BREAST 1995   left   Cervical spinal stenosis    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   Headache(784.0)    HEMORRHOIDS    Hypertension    Personal history of chemotherapy    Personal history of radiation therapy     Past Surgical History:  Procedure Laterality Date   BIOPSY  08/10/2019   Procedure: BIOPSY;  Surgeon: Mauri Pole, MD;  [facility];  Service: Endoscopy;;   BONE MARROW TRANSPLANT  1999   BREAST LUMPECTOMY Left 11/95   left breast   BUNIONECTOMY  3/09, 10/09   right and left   COLON SURGERY     COLONOSCOPY     COLONOSCOPY WITH PROPOFOL [name] 08/10/2019    Procedure: COLONOSCOPY WITH PROPOFOL;  Surgeon: Mauri Pole, MD;  [facility];  Service: Endoscopy;  Laterality: [name];   ERCP  01/21/2012   Procedure: ENDOSCOPIC RETROGRADE CHOLANGIOPANCREATOGRAPHY (ERCP);  Surgeon: Lafayette Dragon, MD;  Location: [facility];  Service: Endoscopy;  Laterality: [name];   hemicolectomy  01/2005   right   HYSTEROSCOPY  08/30/94   INGUINAL HERNIA REPAIR     LEFT HEART CATH AND CORONARY ANGIOGRAPHY [name] 05/21/2018   Procedure: LEFT HEART CATH AND CORONARY ANGIOGRAPHY;  Surgeon: Nelva Bush, MD;  Location: [facility];  Service: Cardiovascular;  Laterality: [name];   NECK MASS EXCISION Left 7/99   ROTATOR CUFF REPAIR Right 09/08/13   full tear   TOTAL ABDOMINAL HYSTERECTOMY W/ BILATERAL SALPINGOOPHORECTOMY  1996   TUNNELED VENOUS CATHETER PLACEMENT  9/98    Current Outpatient Medications  Medication Sig Dispense Refill   acetaminophen (TYLENOL) 500 MG tablet Take 500 mg by mouth every 6 (six) hours as needed for moderate pain or headache.     amiodarone (PACERONE) 200 MG tablet Take 200 mg by mouth daily.     apixaban (ELIQUIS) 5 MG TABS tablet Take 5 mg by mouth 2 (two) times daily.     b  complex vitamins tablet Take 1 tablet by mouth 2 (two) times a week.      Calcium Carb-Cholecalciferol (CALCIUM 500+D PO) Take 1 tablet by mouth daily.     chlorthalidone (HYGROTON) 25 MG tablet Take 25 mg by mouth daily.     magnesium oxide (MAG-OX) 400 MG tablet Take by mouth.     Multiple Minerals (CALCIUM-MAGNESIUM-ZINC) TABS Take 1 tablet by mouth 2 (two) times a week.      vitamin B-12 (CYANOCOBALAMIN) 500 MCG tablet Take 500 mcg by mouth See admin instructions. Take 500 mcg 5 days weekly, takes on non b complex days     vitamin C (ASCORBIC ACID) 500 MG tablet Take 500 mg by mouth 2 (two) times daily.      Vitamin D, Ergocalciferol, (DRISDOL) 50000 UNITS CAPS Take 50,000 Units by mouth every Wednesday.      vitamin E 400 UNIT capsule Take 400 Units by  mouth daily.     carvedilol (COREG) 25 MG tablet Take 1 tablet (25 mg total) by mouth daily.     No current facility-administered medications for this visit.    Family History  Problem Relation Age of Onset   Uterine cancer Mother    Heart attack Father    Colon cancer Neg Hx    Esophageal cancer Neg Hx    Stomach cancer Neg Hx    Rectal cancer Neg Hx     Review of Systems  All other systems reviewed and are negative.  Exam:   BP (!) 184/88 (BP Location: Right Arm, Patient Position: Sitting, Cuff Size: Small)   Pulse 71   Ht 5\' 4"  (1.626 m)   Wt 148 lb (67.1 kg)   LMP 07/30/1995   BMI 25.40 kg/m   Height: 5\' 4"  (162.6 cm)  General appearance: alert, cooperative and appears stated age Breasts: well healed scars on left breast, no masses, skin changes, LAD; right breast normal without LAD, skin changes, nipple discharge Abdomen: soft, non-tender; bowel sounds normal; no masses,  no organomegaly Lymph nodes: Cervical, supraclavicular, and axillary nodes normal.  No abnormal inguinal nodes palpated Neurologic: Grossly normal  Pelvic: External genitalia:  no lesions              Urethra:  normal appearing urethra with no masses, tenderness or lesions              Bartholins and Skenes: normal                 Vagina: normal appearing vagina with atrophic changesand no discharge, no lesions              Cervix: absent              Pap taken: No. Bimanual Exam:  Uterus:  uterus absent              Adnexa: no mass, fullness, tenderness               Rectovaginal: Confirms               Anus:  normal sphincter tone, no lesions  Chaperone, Octaviano Batty, CMA, was present for exam.  Assessment/Plan: 1. Encntr for gyn exam (general) (routine) w/o abn findings - pap smear not indicated.  Pt asks about having pap smear.  With hx of TAH/BSO, there really is no indication for doing one.  Pt comfortable with this after discussing - MMG 12/2020 - BMD 02/2018, osteopenia - Colonoscopy  08/10/2019 with Dr. Silverio Decamp -  lab work done with Dr. Kenton Kingfisher - care gaps reviewed/updated  2. Post-menopausal  3. Osteopenia, unspecified location - DG BONE DENSITY (DXA); Future  4. H/O total hysterectomy with BSO  5. Malignant neoplasm of female breast, unspecified estrogen receptor status, unspecified laterality, unspecified site of breast ([place]) - 1995  6. History of colon cancer - colonoscopy 2021

## 2021-04-26 DIAGNOSIS — I4891 Unspecified atrial fibrillation: Secondary | ICD-10-CM | POA: Diagnosis not present

## 2021-05-15 DIAGNOSIS — Z23 Encounter for immunization: Secondary | ICD-10-CM | POA: Diagnosis not present

## 2021-05-15 DIAGNOSIS — I10 Essential (primary) hypertension: Secondary | ICD-10-CM | POA: Diagnosis not present

## 2021-05-15 DIAGNOSIS — Z853 Personal history of malignant neoplasm of breast: Secondary | ICD-10-CM | POA: Diagnosis not present

## 2021-05-15 DIAGNOSIS — G2581 Restless legs syndrome: Secondary | ICD-10-CM | POA: Diagnosis not present

## 2021-05-15 DIAGNOSIS — E559 Vitamin D deficiency, unspecified: Secondary | ICD-10-CM | POA: Diagnosis not present

## 2021-05-15 DIAGNOSIS — E7800 Pure hypercholesterolemia, unspecified: Secondary | ICD-10-CM | POA: Diagnosis not present

## 2021-05-15 DIAGNOSIS — Z85038 Personal history of other malignant neoplasm of large intestine: Secondary | ICD-10-CM | POA: Diagnosis not present

## 2021-05-15 DIAGNOSIS — I428 Other cardiomyopathies: Secondary | ICD-10-CM | POA: Diagnosis not present

## 2021-05-21 DIAGNOSIS — I10 Essential (primary) hypertension: Secondary | ICD-10-CM | POA: Diagnosis not present

## 2021-05-21 DIAGNOSIS — I4891 Unspecified atrial fibrillation: Secondary | ICD-10-CM | POA: Diagnosis not present

## 2021-05-21 DIAGNOSIS — I4819 Other persistent atrial fibrillation: Secondary | ICD-10-CM | POA: Diagnosis not present

## 2021-05-21 DIAGNOSIS — I428 Other cardiomyopathies: Secondary | ICD-10-CM | POA: Diagnosis not present

## 2021-05-22 DIAGNOSIS — I4891 Unspecified atrial fibrillation: Secondary | ICD-10-CM | POA: Diagnosis not present

## 2021-05-22 DIAGNOSIS — I428 Other cardiomyopathies: Secondary | ICD-10-CM | POA: Diagnosis not present

## 2021-05-22 DIAGNOSIS — I4819 Other persistent atrial fibrillation: Secondary | ICD-10-CM | POA: Diagnosis not present

## 2021-05-22 DIAGNOSIS — I10 Essential (primary) hypertension: Secondary | ICD-10-CM | POA: Diagnosis not present

## 2021-07-04 DIAGNOSIS — C44629 Squamous cell carcinoma of skin of left upper limb, including shoulder: Secondary | ICD-10-CM | POA: Diagnosis not present

## 2021-07-04 DIAGNOSIS — L578 Other skin changes due to chronic exposure to nonionizing radiation: Secondary | ICD-10-CM | POA: Diagnosis not present

## 2021-07-04 DIAGNOSIS — L570 Actinic keratosis: Secondary | ICD-10-CM | POA: Diagnosis not present

## 2021-07-04 DIAGNOSIS — C44721 Squamous cell carcinoma of skin of unspecified lower limb, including hip: Secondary | ICD-10-CM | POA: Diagnosis not present

## 2021-07-04 DIAGNOSIS — C44722 Squamous cell carcinoma of skin of right lower limb, including hip: Secondary | ICD-10-CM | POA: Diagnosis not present

## 2021-07-04 DIAGNOSIS — L821 Other seborrheic keratosis: Secondary | ICD-10-CM | POA: Diagnosis not present

## 2021-07-18 DIAGNOSIS — C44722 Squamous cell carcinoma of skin of right lower limb, including hip: Secondary | ICD-10-CM | POA: Diagnosis not present

## 2021-07-30 DIAGNOSIS — I4891 Unspecified atrial fibrillation: Secondary | ICD-10-CM | POA: Diagnosis not present

## 2021-08-07 DIAGNOSIS — I4819 Other persistent atrial fibrillation: Secondary | ICD-10-CM | POA: Diagnosis not present

## 2021-08-07 DIAGNOSIS — I428 Other cardiomyopathies: Secondary | ICD-10-CM | POA: Diagnosis not present

## 2021-08-07 DIAGNOSIS — I10 Essential (primary) hypertension: Secondary | ICD-10-CM | POA: Diagnosis not present

## 2021-08-08 DIAGNOSIS — Z87891 Personal history of nicotine dependence: Secondary | ICD-10-CM | POA: Diagnosis not present

## 2021-08-08 DIAGNOSIS — I517 Cardiomegaly: Secondary | ICD-10-CM | POA: Diagnosis not present

## 2021-08-08 DIAGNOSIS — I10 Essential (primary) hypertension: Secondary | ICD-10-CM | POA: Diagnosis not present

## 2021-08-08 DIAGNOSIS — Z79899 Other long term (current) drug therapy: Secondary | ICD-10-CM | POA: Diagnosis not present

## 2021-08-16 DIAGNOSIS — R946 Abnormal results of thyroid function studies: Secondary | ICD-10-CM | POA: Diagnosis not present

## 2021-08-21 ENCOUNTER — Telehealth: Payer: Self-pay | Admitting: Oncology

## 2021-08-21 NOTE — Telephone Encounter (Signed)
Rescheduled 03/28 to 03/29 due to providers schedule, patient has been called and voicemail was left.

## 2021-09-16 IMAGING — MG MM DIGITAL SCREENING BILAT W/ TOMO AND CAD
8 series · 8 of 24 positions shown · non-contrast
Comparison: Previous exam(s).

CLINICAL DATA: Screening.

EXAM:
DIGITAL SCREENING BILATERAL MAMMOGRAM WITH TOMOSYNTHESIS AND CAD
TECHNIQUE: Bilateral screening digital craniocaudal and mediolateral oblique
mammograms were obtained. Bilateral screening digital breast
tomosynthesis was performed. The images were evaluated with
computer-aided detection.

[L CC synth-2D]
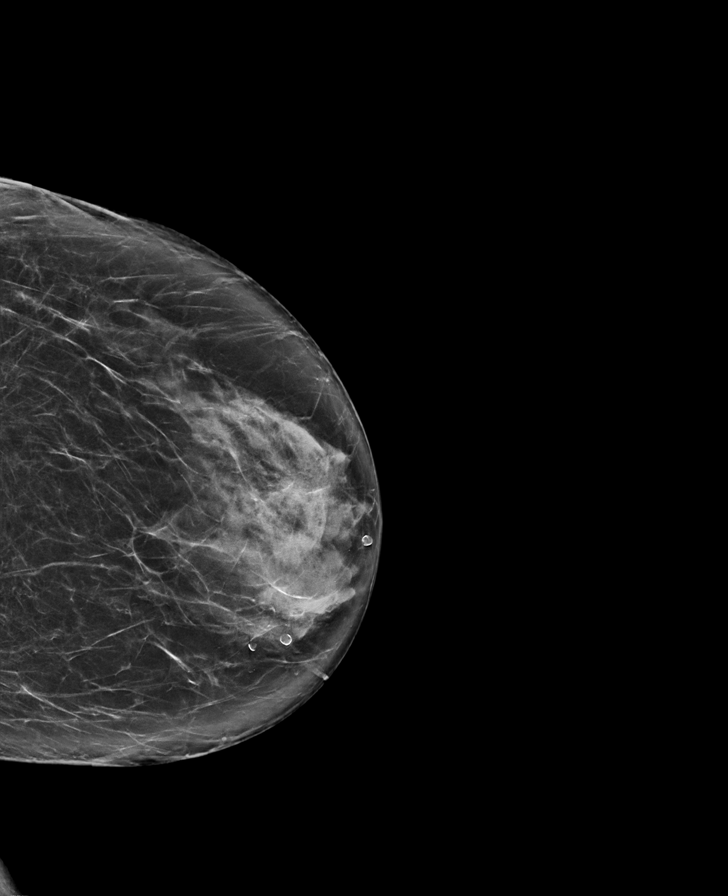

[R CC synth-2D]
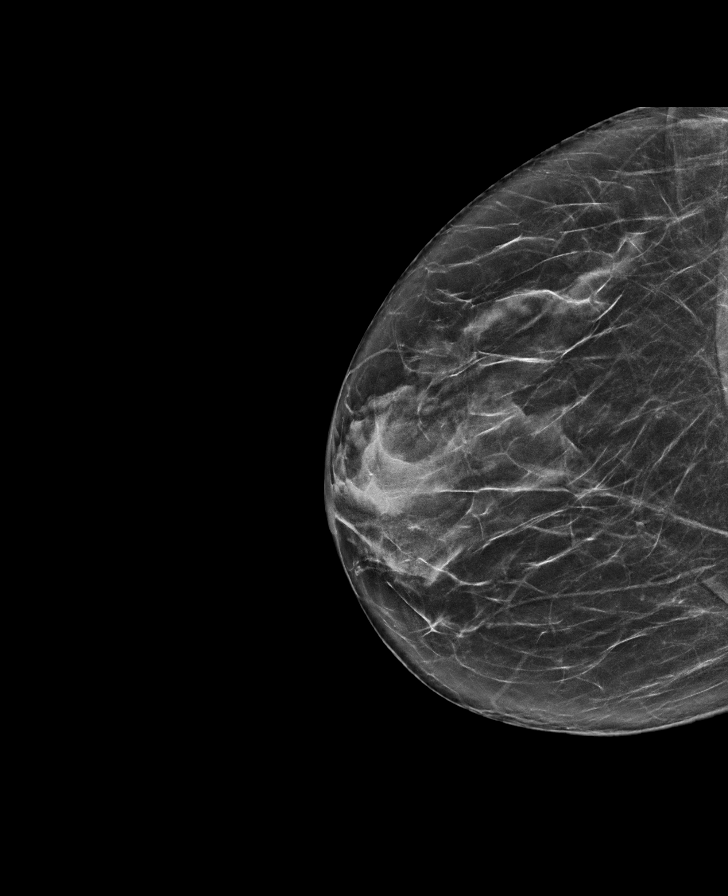

[R MLO synth-2D]
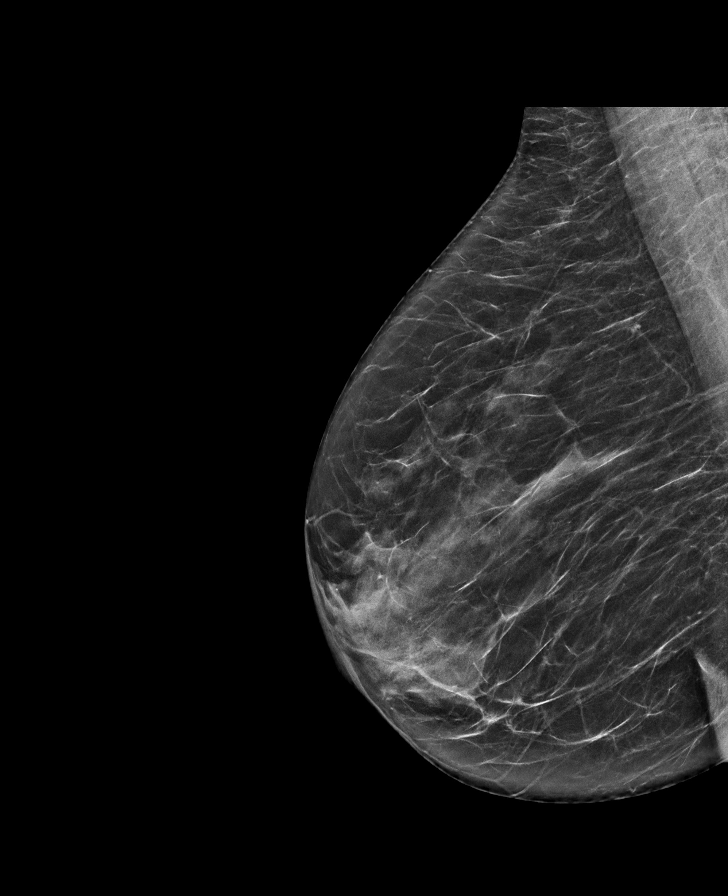

[L MLO synth-2D]
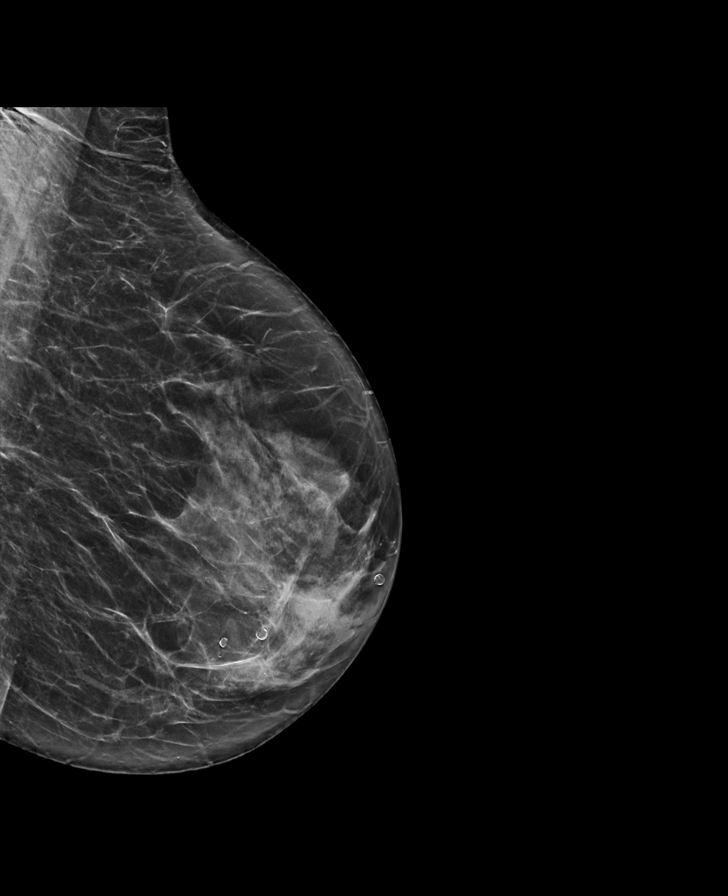

[R CC tomo · tomo slice 34/67.0]
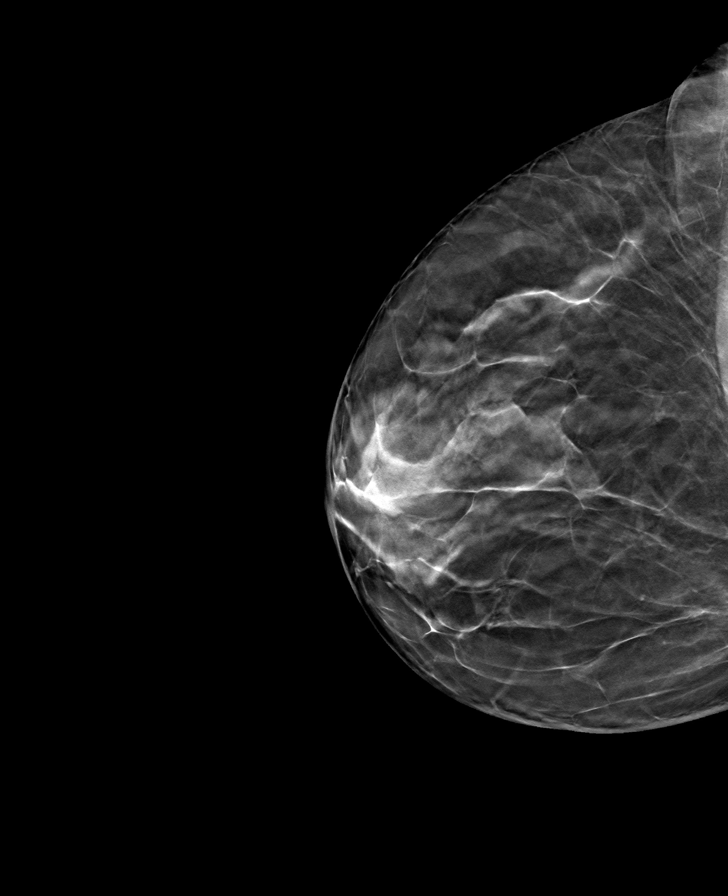

[L CC tomo · tomo slice 39/76.0]
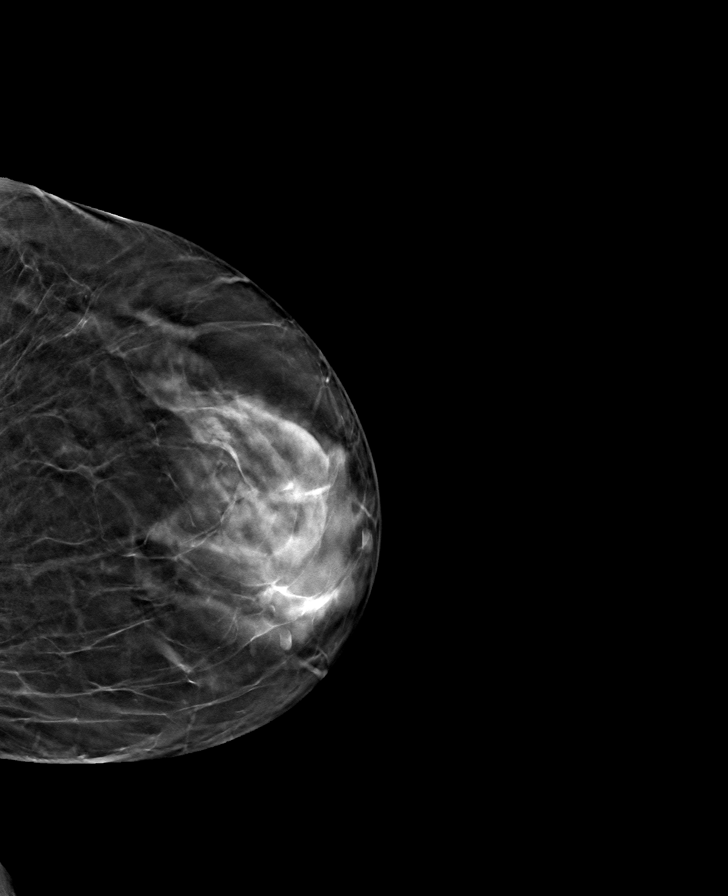

[L MLO tomo · tomo slice 35/68.0]
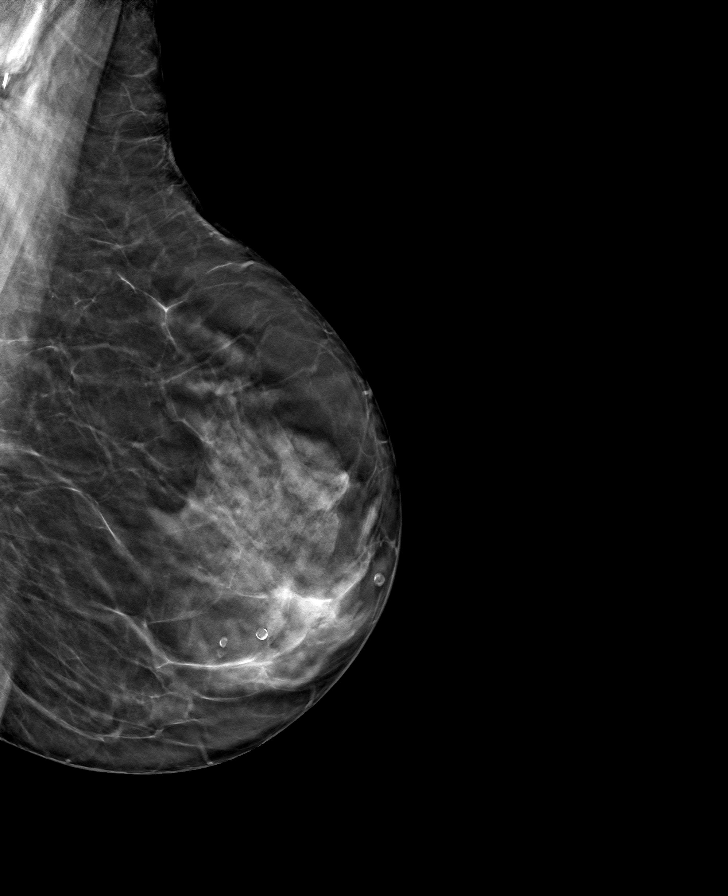

[R MLO tomo · tomo slice 34/67.0]
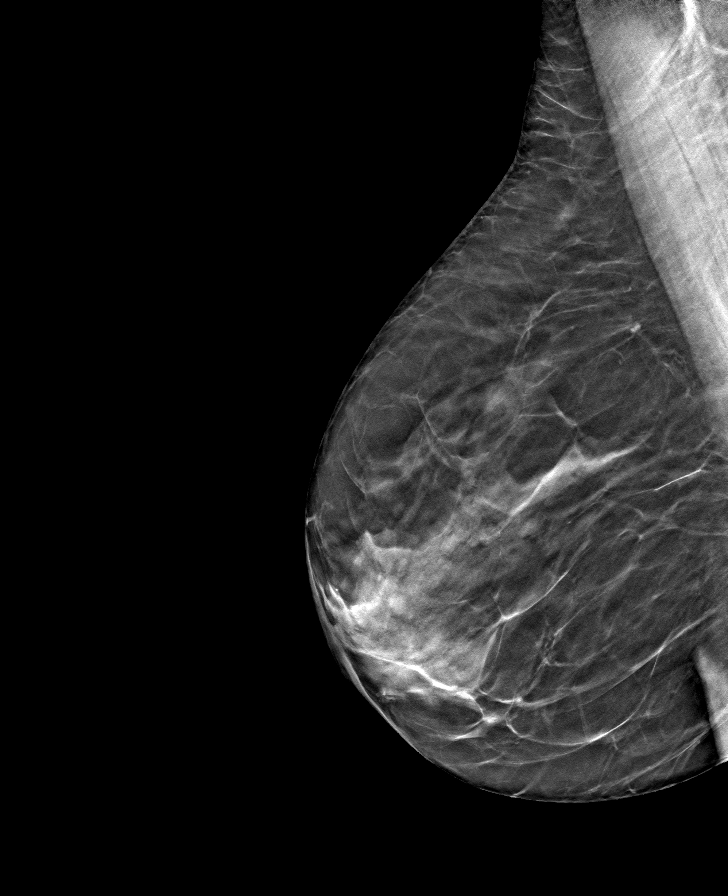

[8 of 24 positions shown; findings below may reference images not displayed]

ACR Breast Density Category c: The breast tissue is heterogeneously
dense, which may obscure small masses.
FINDINGS: There are no findings suspicious for malignancy.
IMPRESSION: No mammographic evidence of malignancy. A result letter of this
screening mammogram will be mailed directly to the patient.

RECOMMENDATION:
Screening mammogram in one year. (Code:Q3-W-BC3)

BI-RADS CATEGORY  1: Negative.

## 2021-09-17 DIAGNOSIS — Z85828 Personal history of other malignant neoplasm of skin: Secondary | ICD-10-CM | POA: Diagnosis not present

## 2021-09-17 DIAGNOSIS — Z23 Encounter for immunization: Secondary | ICD-10-CM | POA: Diagnosis not present

## 2021-09-17 DIAGNOSIS — L578 Other skin changes due to chronic exposure to nonionizing radiation: Secondary | ICD-10-CM | POA: Diagnosis not present

## 2021-09-17 DIAGNOSIS — L570 Actinic keratosis: Secondary | ICD-10-CM | POA: Diagnosis not present

## 2021-09-17 DIAGNOSIS — L821 Other seborrheic keratosis: Secondary | ICD-10-CM | POA: Diagnosis not present

## 2021-10-01 DIAGNOSIS — I5189 Other ill-defined heart diseases: Secondary | ICD-10-CM | POA: Diagnosis not present

## 2021-10-01 DIAGNOSIS — I428 Other cardiomyopathies: Secondary | ICD-10-CM | POA: Diagnosis not present

## 2021-10-01 DIAGNOSIS — I4819 Other persistent atrial fibrillation: Secondary | ICD-10-CM | POA: Diagnosis not present

## 2021-10-01 DIAGNOSIS — I080 Rheumatic disorders of both mitral and aortic valves: Secondary | ICD-10-CM | POA: Diagnosis not present

## 2021-10-01 DIAGNOSIS — I517 Cardiomegaly: Secondary | ICD-10-CM | POA: Diagnosis not present

## 2021-10-08 DIAGNOSIS — I4891 Unspecified atrial fibrillation: Secondary | ICD-10-CM | POA: Diagnosis not present

## 2021-10-17 DIAGNOSIS — E039 Hypothyroidism, unspecified: Secondary | ICD-10-CM | POA: Diagnosis not present

## 2021-10-17 DIAGNOSIS — L570 Actinic keratosis: Secondary | ICD-10-CM | POA: Diagnosis not present

## 2021-10-17 DIAGNOSIS — C44722 Squamous cell carcinoma of skin of right lower limb, including hip: Secondary | ICD-10-CM | POA: Diagnosis not present

## 2021-10-18 ENCOUNTER — Telehealth (HOSPITAL_BASED_OUTPATIENT_CLINIC_OR_DEPARTMENT_OTHER): Payer: Self-pay | Admitting: Obstetrics & Gynecology

## 2021-10-18 ENCOUNTER — Inpatient Hospital Stay: Admission: RE | Admit: 2021-10-18 | Payer: Medicare Other | Source: Ambulatory Visit

## 2021-10-18 NOTE — Telephone Encounter (Signed)
Spoke with pts husband to let him know that bone density is scheduled today at 1:00 with the breast center. He states that she is not at home, so he cant let her know. Advised that he could inform her of the appt time when she returns. She can call the breast center and reschedule the bone density test to be done when she has her mammogram in June ?

## 2021-10-18 NOTE — Telephone Encounter (Signed)
Patient called and would like for the nurse or Dr.Miller to call her about  her mammogram order that was  placed ands where . ?

## 2021-10-23 ENCOUNTER — Other Ambulatory Visit: Payer: Medicare Other

## 2021-10-23 ENCOUNTER — Ambulatory Visit: Payer: Medicare Other | Admitting: Oncology

## 2021-10-23 DIAGNOSIS — H04123 Dry eye syndrome of bilateral lacrimal glands: Secondary | ICD-10-CM | POA: Diagnosis not present

## 2021-10-23 DIAGNOSIS — H353131 Nonexudative age-related macular degeneration, bilateral, early dry stage: Secondary | ICD-10-CM | POA: Diagnosis not present

## 2021-10-23 DIAGNOSIS — H2513 Age-related nuclear cataract, bilateral: Secondary | ICD-10-CM | POA: Diagnosis not present

## 2021-10-24 ENCOUNTER — Inpatient Hospital Stay (HOSPITAL_BASED_OUTPATIENT_CLINIC_OR_DEPARTMENT_OTHER): Payer: Medicare Other | Admitting: Oncology

## 2021-10-24 ENCOUNTER — Other Ambulatory Visit: Payer: Self-pay

## 2021-10-24 ENCOUNTER — Inpatient Hospital Stay: Payer: Medicare Other | Attending: Oncology

## 2021-10-24 VITALS — BP 177/90 | HR 72 | Temp 98.1°F | Resp 18 | Ht 64.0 in | Wt 147.4 lb

## 2021-10-24 DIAGNOSIS — Z85048 Personal history of other malignant neoplasm of rectum, rectosigmoid junction, and anus: Secondary | ICD-10-CM | POA: Diagnosis not present

## 2021-10-24 DIAGNOSIS — Z853 Personal history of malignant neoplasm of breast: Secondary | ICD-10-CM | POA: Diagnosis not present

## 2021-10-24 DIAGNOSIS — Z9221 Personal history of antineoplastic chemotherapy: Secondary | ICD-10-CM | POA: Diagnosis not present

## 2021-10-24 DIAGNOSIS — Z9484 Stem cells transplant status: Secondary | ICD-10-CM | POA: Insufficient documentation

## 2021-10-24 DIAGNOSIS — C189 Malignant neoplasm of colon, unspecified: Secondary | ICD-10-CM | POA: Diagnosis not present

## 2021-10-24 DIAGNOSIS — Z7901 Long term (current) use of anticoagulants: Secondary | ICD-10-CM | POA: Diagnosis not present

## 2021-10-24 LAB — CMP (CANCER CENTER ONLY)
ALT: 28 U/L (ref 0–44)
AST: 25 U/L (ref 15–41)
Albumin: 4.2 g/dL (ref 3.5–5.0)
Alkaline Phosphatase: 63 U/L (ref 38–126)
Anion gap: 8 (ref 5–15)
BUN: 33 mg/dL — ABNORMAL HIGH (ref 8–23)
CO2: 30 mmol/L (ref 22–32)
Calcium: 9.3 mg/dL (ref 8.9–10.3)
Chloride: 100 mmol/L (ref 98–111)
Creatinine: 1.19 mg/dL — ABNORMAL HIGH (ref 0.44–1.00)
GFR, Estimated: 48 mL/min — ABNORMAL LOW (ref >60)
Glucose, Bld: 91 mg/dL (ref 70–99)
Potassium: 3.9 mmol/L (ref 3.5–5.1)
Sodium: 138 mmol/L (ref 135–145)
Total Bilirubin: 0.4 mg/dL (ref 0.3–1.2)
Total Protein: 6.9 g/dL (ref 6.5–8.1)

## 2021-10-24 LAB — CBC WITH DIFFERENTIAL (CANCER CENTER ONLY)
Abs Immature Granulocytes: 0.02 10*3/uL (ref 0.00–0.07)
Basophils Absolute: 0 10*3/uL (ref 0.0–0.1)
Basophils Relative: 1 %
Eosinophils Absolute: 0.2 10*3/uL (ref 0.0–0.5)
Eosinophils Relative: 2 %
HCT: 39.1 % (ref 36.0–46.0)
Hemoglobin: 12.7 g/dL (ref 12.0–15.0)
Immature Granulocytes: 0 %
Lymphocytes Relative: 25 %
Lymphs Abs: 2.1 10*3/uL (ref 0.7–4.0)
MCH: 28.8 pg (ref 26.0–34.0)
MCHC: 32.5 g/dL (ref 30.0–36.0)
MCV: 88.7 fL (ref 80.0–100.0)
Monocytes Absolute: 0.8 10*3/uL (ref 0.1–1.0)
Monocytes Relative: 9 %
Neutro Abs: 5.4 10*3/uL (ref 1.7–7.7)
Neutrophils Relative %: 63 %
Platelet Count: 236 10*3/uL (ref 150–400)
RBC: 4.41 MIL/uL (ref 3.87–5.11)
RDW: 13.1 % (ref 11.5–15.5)
WBC Count: 8.5 10*3/uL (ref 4.0–10.5)
nRBC: 0 % (ref 0.0–0.2)

## 2021-10-24 NOTE — Progress Notes (Signed)
Hematology and Oncology Follow Up Visit ? ?Eileen [name] ?[id number] ?[birth year] 76 y.o. ?10/24/2021 3:34 PM ?Shirline Frees, MDHarris, [name], MD  ? ? ?Principle Diagnosis:  76 year old woman with:  ? ? ?1.  Breast cancer diagnosed in 1995.  She was found to have locally advanced breast cancer treated with high-dose chemotherapy with stem cell transplant. ? ? ?2.  Stage IIa colon cancer diagnosed in 2006.  ? ?Prior Therapy: ?1. She is status post lumpectomy and lymph node dissection followed by CMF and radiation therapy. ?2. She is status post 4 cycles of Adriamycin and Taxotere followed by stem cell transplantation at [facility] on 08/16/1997 due to recurrence and the left supraclavicular lymph node. Her tumor is ER negative PR positive. ?3. She is status post right colectomy for stage IIA adenocarcinoma of the colon in October of 2006. She is status post 10 cycles of FOLFOX concluded in 06/2005 ?4. Femara taken between 2001 and 2017. ? ? ?Current therapy: Active surveillance. ? ?Interim History:  Eileen [name] returns today for a follow-up visit.  Since last visit, she reports no major changes in her health.  She denies any recent hospitalizations or illnesses.  She denies any abdominal pain or weight loss.  She is up-to-date on her mammograms and schedule for the next 20 of this year. ? ? ?Medications: Updated on review. ?Current Outpatient Medications  ?Medication Sig Dispense Refill  ? acetaminophen (TYLENOL) 500 MG tablet Take 500 mg by mouth every 6 (six) hours as needed for moderate pain or headache.    ? amiodarone (PACERONE) 200 MG tablet Take 200 mg by mouth daily.    ? apixaban (ELIQUIS) 5 MG TABS tablet Take 5 mg by mouth 2 (two) times daily.    ? b complex vitamins tablet Take 1 tablet by mouth 2 (two) times a week.     ? Calcium Carb-Cholecalciferol (CALCIUM 500+D PO) Take 1 tablet by mouth daily.    ? carvedilol (COREG) 25 MG tablet Take 1 tablet (25 mg total) by mouth daily.    ? chlorthalidone  (HYGROTON) 25 MG tablet Take 25 mg by mouth daily.    ? magnesium oxide (MAG-OX) 400 MG tablet Take by mouth.    ? Multiple Minerals (CALCIUM-MAGNESIUM-ZINC) TABS Take 1 tablet by mouth 2 (two) times a week.     ? vitamin B-12 (CYANOCOBALAMIN) 500 MCG tablet Take 500 mcg by mouth See admin instructions. Take 500 mcg 5 days weekly, takes on non b complex days    ? vitamin C (ASCORBIC ACID) 500 MG tablet Take 500 mg by mouth 2 (two) times daily.     ? Vitamin D, Ergocalciferol, (DRISDOL) 50000 UNITS CAPS Take 50,000 Units by mouth every Wednesday.     ? vitamin E 400 UNIT capsule Take 400 Units by mouth daily.    ? ?No current facility-administered medications for this visit.  ? ? ? ?Allergies:  ?Allergies  ?Allergen Reactions  ? Sacubitril-Valsartan Anaphylaxis, Diarrhea and Nausea Only  ? Carvedilol Other (See Comments)  ?  Other reaction(s): diarrhea  ? Levofloxacin Other (See Comments)  ?  Red streaks to IV site when IV dose infusing and arm feeling very irritated, throbbing, painful  ? Methocarbamol Rash  ?  Other reaction(s): Other  ? Tape   ?  Can use paper tape, adhesive causes redness  ? ? ? ? ? ?Physical Exam: ? ?Blood pressure (!) 177/90, pulse 72, temperature 98.1 ?F (36.7 ?C), temperature source Temporal, resp. rate 18, height '5\' 4"'$  (  1.626 m), weight 147 lb 6.4 oz (66.9 kg), last menstrual period 07/30/1995, SpO2 99 %. ? ? ? ? ?ECOG: 1 ? ? ?General appearance: Alert, awake without any distress. ?Head: Atraumatic without abnormalities ?Oropharynx: Without any thrush or ulcers. ?Eyes: No scleral icterus. ?Lymph nodes: No lymphadenopathy noted in the cervical, supraclavicular, or axillary nodes ?Heart:regular rate and rhythm, without any murmurs or gallops.   ?Lung: Clear to auscultation without any rhonchi, wheezes or dullness to percussion. ?Abdomin: Soft, nontender without any shifting dullness or ascites. ?Musculoskeletal: No clubbing or cyanosis. ?Neurological: No motor or sensory deficits. ?Skin: No  rashes or lesions. ? ? ? ?Lab Results: ?Lab Results  ?Component Value Date  ? WBC 10.9 (H) 10/25/2020  ? HGB 12.5 10/25/2020  ? HCT 39.4 10/25/2020  ? MCV 88.9 10/25/2020  ? PLT 231 10/25/2020  ? ?  Chemistry   ?   ?Component Value Date/Time  ? NA 140 10/25/2020 1313  ? NA 142 03/31/2019 1320  ? NA 142 03/12/2017 1116  ? K 5.0 10/25/2020 1313  ? K 4.2 03/12/2017 1116  ? CL 104 10/25/2020 1313  ? CL 100 03/18/2012 1422  ? CO2 26 10/25/2020 1313  ? CO2 25 03/12/2017 1116  ? BUN 19 10/25/2020 1313  ? BUN 15 03/31/2019 1320  ? BUN 18.8 03/12/2017 1116  ? CREATININE 0.98 10/25/2020 1313  ? CREATININE 0.8 03/12/2017 1116  ?    ?Component Value Date/Time  ? CALCIUM 8.8 (L) 10/25/2020 1313  ? CALCIUM 9.7 03/12/2017 1116  ? ALKPHOS 75 10/25/2020 1313  ? ALKPHOS 71 03/12/2017 1116  ? AST 23 10/25/2020 1313  ? AST 27 03/12/2017 1116  ? ALT 28 10/25/2020 1313  ? ALT 32 03/12/2017 1116  ? BILITOT 0.5 10/25/2020 1313  ? BILITOT 0.64 03/12/2017 1116  ?  ? ? ? ? ? ?Impression and Plan: ? ?76 year old woman with:  ? ?1.  Stage II colon cancer diagnosed in 2006.  She is currently on active surveillance without any evidence of relapse.  The natural course of this disease was reviewed at this time and no additional oncology evaluation is needed at this time.  I recommended continued colonoscopy as scheduled. ? ? ?2. Breast cancer: She is currently on active surveillance with mammography and she is currently up-to-date. ? ?3.  Survivorship issues: No delayed complications related to chemotherapy at this time.  Cardiovascular complication as well as bone marrow disease continues to be a consideration moving forward. ? ?4. Follow-up: In 1 year for repeat follow-up. ? ?20  minutes were dedicated to this encounter.  The time was spent on reviewing laboratory data, disease status update and outlining future plan of care discussion. ? ? ?Zola Button, MD ?3/29/20233:34 PM ?

## 2022-01-16 ENCOUNTER — Other Ambulatory Visit: Payer: Self-pay | Admitting: Family Medicine

## 2022-01-16 DIAGNOSIS — Z1231 Encounter for screening mammogram for malignant neoplasm of breast: Secondary | ICD-10-CM

## 2022-01-21 DIAGNOSIS — E039 Hypothyroidism, unspecified: Secondary | ICD-10-CM | POA: Diagnosis not present

## 2022-01-28 ENCOUNTER — Ambulatory Visit
Admission: RE | Admit: 2022-01-28 | Discharge: 2022-01-28 | Disposition: A | Discharge status: Home / Self Care | Payer: Medicare Other | Source: Ambulatory Visit | Attending: Family Medicine | Admitting: Family Medicine

## 2022-01-28 DIAGNOSIS — Z1231 Encounter for screening mammogram for malignant neoplasm of breast: Secondary | ICD-10-CM

## 2022-02-04 DIAGNOSIS — I4891 Unspecified atrial fibrillation: Secondary | ICD-10-CM | POA: Diagnosis not present

## 2022-02-27 DIAGNOSIS — M7918 Myalgia, other site: Secondary | ICD-10-CM | POA: Diagnosis not present

## 2022-03-06 ENCOUNTER — Telehealth: Payer: Self-pay | Admitting: Oncology

## 2022-03-06 NOTE — Telephone Encounter (Signed)
Called patient regarding upcoming 2024 appointment, patient is notified.  

## 2022-03-11 DIAGNOSIS — I428 Other cardiomyopathies: Secondary | ICD-10-CM | POA: Diagnosis not present

## 2022-03-11 DIAGNOSIS — Z85038 Personal history of other malignant neoplasm of large intestine: Secondary | ICD-10-CM | POA: Diagnosis not present

## 2022-03-11 DIAGNOSIS — Z0000 Encounter for general adult medical examination without abnormal findings: Secondary | ICD-10-CM | POA: Diagnosis not present

## 2022-03-11 DIAGNOSIS — I10 Essential (primary) hypertension: Secondary | ICD-10-CM | POA: Diagnosis not present

## 2022-03-11 DIAGNOSIS — E7800 Pure hypercholesterolemia, unspecified: Secondary | ICD-10-CM | POA: Diagnosis not present

## 2022-03-11 DIAGNOSIS — E039 Hypothyroidism, unspecified: Secondary | ICD-10-CM | POA: Diagnosis not present

## 2022-03-11 DIAGNOSIS — E559 Vitamin D deficiency, unspecified: Secondary | ICD-10-CM | POA: Diagnosis not present

## 2022-03-11 DIAGNOSIS — Z853 Personal history of malignant neoplasm of breast: Secondary | ICD-10-CM | POA: Diagnosis not present

## 2022-03-11 DIAGNOSIS — I480 Paroxysmal atrial fibrillation: Secondary | ICD-10-CM | POA: Diagnosis not present

## 2022-03-25 DIAGNOSIS — M5385 Other specified dorsopathies, thoracolumbar region: Secondary | ICD-10-CM | POA: Diagnosis not present

## 2022-03-25 DIAGNOSIS — M5136 Other intervertebral disc degeneration, lumbar region: Secondary | ICD-10-CM | POA: Diagnosis not present

## 2022-03-25 DIAGNOSIS — M2578 Osteophyte, vertebrae: Secondary | ICD-10-CM | POA: Diagnosis not present

## 2022-03-25 DIAGNOSIS — M7918 Myalgia, other site: Secondary | ICD-10-CM | POA: Diagnosis not present

## 2022-03-25 DIAGNOSIS — M1611 Unilateral primary osteoarthritis, right hip: Secondary | ICD-10-CM | POA: Diagnosis not present

## 2022-03-25 DIAGNOSIS — M47817 Spondylosis without myelopathy or radiculopathy, lumbosacral region: Secondary | ICD-10-CM | POA: Diagnosis not present

## 2022-03-29 DIAGNOSIS — I4891 Unspecified atrial fibrillation: Secondary | ICD-10-CM | POA: Diagnosis not present

## 2022-03-30 DIAGNOSIS — I5022 Chronic systolic (congestive) heart failure: Secondary | ICD-10-CM | POA: Diagnosis not present

## 2022-03-30 DIAGNOSIS — R29810 Facial weakness: Secondary | ICD-10-CM | POA: Diagnosis not present

## 2022-03-30 DIAGNOSIS — Z90710 Acquired absence of both cervix and uterus: Secondary | ICD-10-CM | POA: Diagnosis not present

## 2022-03-30 DIAGNOSIS — I4891 Unspecified atrial fibrillation: Secondary | ICD-10-CM | POA: Diagnosis not present

## 2022-03-30 DIAGNOSIS — J9691 Respiratory failure, unspecified with hypoxia: Secondary | ICD-10-CM | POA: Diagnosis not present

## 2022-03-30 DIAGNOSIS — Z7982 Long term (current) use of aspirin: Secondary | ICD-10-CM | POA: Diagnosis not present

## 2022-03-30 DIAGNOSIS — I639 Cerebral infarction, unspecified: Secondary | ICD-10-CM | POA: Diagnosis not present

## 2022-03-30 DIAGNOSIS — I10 Essential (primary) hypertension: Secondary | ICD-10-CM | POA: Diagnosis not present

## 2022-03-30 DIAGNOSIS — Z4659 Encounter for fitting and adjustment of other gastrointestinal appliance and device: Secondary | ICD-10-CM | POA: Diagnosis not present

## 2022-03-30 DIAGNOSIS — R0689 Other abnormalities of breathing: Secondary | ICD-10-CM | POA: Diagnosis not present

## 2022-03-30 DIAGNOSIS — I63433 Cerebral infarction due to embolism of bilateral posterior cerebral arteries: Secondary | ICD-10-CM | POA: Diagnosis not present

## 2022-03-30 DIAGNOSIS — R404 Transient alteration of awareness: Secondary | ICD-10-CM | POA: Diagnosis not present

## 2022-03-30 DIAGNOSIS — R402431 Glasgow coma scale score 3-8, in the field [EMT or ambulance]: Secondary | ICD-10-CM | POA: Diagnosis not present

## 2022-03-30 DIAGNOSIS — E119 Type 2 diabetes mellitus without complications: Secondary | ICD-10-CM | POA: Diagnosis not present

## 2022-03-30 DIAGNOSIS — I110 Hypertensive heart disease with heart failure: Secondary | ICD-10-CM | POA: Diagnosis not present

## 2022-03-30 DIAGNOSIS — Z781 Physical restraint status: Secondary | ICD-10-CM | POA: Diagnosis not present

## 2022-03-30 DIAGNOSIS — I440 Atrioventricular block, first degree: Secondary | ICD-10-CM | POA: Diagnosis not present

## 2022-03-30 DIAGNOSIS — I491 Atrial premature depolarization: Secondary | ICD-10-CM | POA: Diagnosis not present

## 2022-03-30 DIAGNOSIS — I480 Paroxysmal atrial fibrillation: Secondary | ICD-10-CM | POA: Diagnosis not present

## 2022-03-30 DIAGNOSIS — I6322 Cerebral infarction due to unspecified occlusion or stenosis of basilar arteries: Secondary | ICD-10-CM | POA: Diagnosis not present

## 2022-03-30 DIAGNOSIS — I4892 Unspecified atrial flutter: Secondary | ICD-10-CM | POA: Diagnosis not present

## 2022-03-30 DIAGNOSIS — Z85038 Personal history of other malignant neoplasm of large intestine: Secondary | ICD-10-CM | POA: Diagnosis not present

## 2022-03-30 DIAGNOSIS — E785 Hyperlipidemia, unspecified: Secondary | ICD-10-CM | POA: Diagnosis not present

## 2022-03-30 DIAGNOSIS — Z79899 Other long term (current) drug therapy: Secondary | ICD-10-CM | POA: Diagnosis not present

## 2022-03-30 DIAGNOSIS — I272 Pulmonary hypertension, unspecified: Secondary | ICD-10-CM | POA: Diagnosis not present

## 2022-03-30 DIAGNOSIS — E039 Hypothyroidism, unspecified: Secondary | ICD-10-CM | POA: Diagnosis not present

## 2022-03-30 DIAGNOSIS — I63342 Cerebral infarction due to thrombosis of left cerebellar artery: Secondary | ICD-10-CM | POA: Diagnosis not present

## 2022-03-30 DIAGNOSIS — Z87891 Personal history of nicotine dependence: Secondary | ICD-10-CM | POA: Diagnosis not present

## 2022-03-30 DIAGNOSIS — I63332 Cerebral infarction due to thrombosis of left posterior cerebral artery: Secondary | ICD-10-CM | POA: Diagnosis not present

## 2022-03-30 DIAGNOSIS — J984 Other disorders of lung: Secondary | ICD-10-CM | POA: Diagnosis not present

## 2022-03-30 DIAGNOSIS — D72829 Elevated white blood cell count, unspecified: Secondary | ICD-10-CM | POA: Diagnosis not present

## 2022-03-30 DIAGNOSIS — H538 Other visual disturbances: Secondary | ICD-10-CM | POA: Diagnosis not present

## 2022-03-30 DIAGNOSIS — R29721 NIHSS score 21: Secondary | ICD-10-CM | POA: Diagnosis not present

## 2022-03-30 DIAGNOSIS — Z853 Personal history of malignant neoplasm of breast: Secondary | ICD-10-CM | POA: Diagnosis not present

## 2022-03-30 DIAGNOSIS — R471 Dysarthria and anarthria: Secondary | ICD-10-CM | POA: Diagnosis not present

## 2022-03-30 DIAGNOSIS — R4781 Slurred speech: Secondary | ICD-10-CM | POA: Diagnosis not present

## 2022-03-30 DIAGNOSIS — I082 Rheumatic disorders of both aortic and tricuspid valves: Secondary | ICD-10-CM | POA: Diagnosis not present

## 2022-03-30 HISTORY — DX: Cerebral infarction, unspecified: I63.9

## 2022-04-02 DIAGNOSIS — I10 Essential (primary) hypertension: Secondary | ICD-10-CM | POA: Diagnosis not present

## 2022-04-02 DIAGNOSIS — I6389 Other cerebral infarction: Secondary | ICD-10-CM | POA: Diagnosis not present

## 2022-04-02 DIAGNOSIS — I110 Hypertensive heart disease with heart failure: Secondary | ICD-10-CM | POA: Diagnosis not present

## 2022-04-02 DIAGNOSIS — E7800 Pure hypercholesterolemia, unspecified: Secondary | ICD-10-CM | POA: Diagnosis not present

## 2022-04-02 DIAGNOSIS — E785 Hyperlipidemia, unspecified: Secondary | ICD-10-CM | POA: Diagnosis not present

## 2022-04-02 DIAGNOSIS — H5340 Unspecified visual field defects: Secondary | ICD-10-CM | POA: Diagnosis not present

## 2022-04-02 DIAGNOSIS — Z741 Need for assistance with personal care: Secondary | ICD-10-CM | POA: Diagnosis not present

## 2022-04-02 DIAGNOSIS — R1310 Dysphagia, unspecified: Secondary | ICD-10-CM | POA: Diagnosis not present

## 2022-04-02 DIAGNOSIS — H538 Other visual disturbances: Secondary | ICD-10-CM | POA: Diagnosis not present

## 2022-04-02 DIAGNOSIS — Z7982 Long term (current) use of aspirin: Secondary | ICD-10-CM | POA: Diagnosis not present

## 2022-04-02 DIAGNOSIS — Z853 Personal history of malignant neoplasm of breast: Secondary | ICD-10-CM | POA: Diagnosis not present

## 2022-04-02 DIAGNOSIS — E876 Hypokalemia: Secondary | ICD-10-CM | POA: Diagnosis not present

## 2022-04-02 DIAGNOSIS — E039 Hypothyroidism, unspecified: Secondary | ICD-10-CM | POA: Diagnosis not present

## 2022-04-02 DIAGNOSIS — I5020 Unspecified systolic (congestive) heart failure: Secondary | ICD-10-CM | POA: Diagnosis not present

## 2022-04-02 DIAGNOSIS — Z79899 Other long term (current) drug therapy: Secondary | ICD-10-CM | POA: Diagnosis not present

## 2022-04-02 DIAGNOSIS — I69891 Dysphagia following other cerebrovascular disease: Secondary | ICD-10-CM | POA: Diagnosis not present

## 2022-04-02 DIAGNOSIS — Z85038 Personal history of other malignant neoplasm of large intestine: Secondary | ICD-10-CM | POA: Diagnosis not present

## 2022-04-02 DIAGNOSIS — R269 Unspecified abnormalities of gait and mobility: Secondary | ICD-10-CM | POA: Diagnosis not present

## 2022-04-02 DIAGNOSIS — I63439 Cerebral infarction due to embolism of unspecified posterior cerebral artery: Secondary | ICD-10-CM | POA: Diagnosis not present

## 2022-04-02 DIAGNOSIS — I4892 Unspecified atrial flutter: Secondary | ICD-10-CM | POA: Diagnosis not present

## 2022-04-02 DIAGNOSIS — R531 Weakness: Secondary | ICD-10-CM | POA: Diagnosis not present

## 2022-04-02 DIAGNOSIS — I480 Paroxysmal atrial fibrillation: Secondary | ICD-10-CM | POA: Diagnosis not present

## 2022-04-02 DIAGNOSIS — I69393 Ataxia following cerebral infarction: Secondary | ICD-10-CM | POA: Diagnosis not present

## 2022-04-02 DIAGNOSIS — I639 Cerebral infarction, unspecified: Secondary | ICD-10-CM | POA: Insufficient documentation

## 2022-04-02 DIAGNOSIS — I69391 Dysphagia following cerebral infarction: Secondary | ICD-10-CM | POA: Diagnosis not present

## 2022-04-02 DIAGNOSIS — R42 Dizziness and giddiness: Secondary | ICD-10-CM | POA: Diagnosis not present

## 2022-04-02 DIAGNOSIS — I69398 Other sequelae of cerebral infarction: Secondary | ICD-10-CM | POA: Diagnosis not present

## 2022-04-02 DIAGNOSIS — K5900 Constipation, unspecified: Secondary | ICD-10-CM | POA: Diagnosis not present

## 2022-04-02 DIAGNOSIS — I69322 Dysarthria following cerebral infarction: Secondary | ICD-10-CM | POA: Diagnosis not present

## 2022-04-02 DIAGNOSIS — H532 Diplopia: Secondary | ICD-10-CM | POA: Diagnosis not present

## 2022-04-02 DIAGNOSIS — Z7409 Other reduced mobility: Secondary | ICD-10-CM | POA: Diagnosis not present

## 2022-04-02 DIAGNOSIS — I4891 Unspecified atrial fibrillation: Secondary | ICD-10-CM | POA: Diagnosis not present

## 2022-04-08 ENCOUNTER — Other Ambulatory Visit: Payer: Medicare Other

## 2022-04-11 DIAGNOSIS — M461 Sacroiliitis, not elsewhere classified: Secondary | ICD-10-CM | POA: Diagnosis not present

## 2022-04-15 DIAGNOSIS — Z09 Encounter for follow-up examination after completed treatment for conditions other than malignant neoplasm: Secondary | ICD-10-CM | POA: Diagnosis not present

## 2022-04-15 DIAGNOSIS — I10 Essential (primary) hypertension: Secondary | ICD-10-CM | POA: Diagnosis not present

## 2022-04-15 DIAGNOSIS — I6350 Cerebral infarction due to unspecified occlusion or stenosis of unspecified cerebral artery: Secondary | ICD-10-CM | POA: Diagnosis not present

## 2022-04-15 DIAGNOSIS — E782 Mixed hyperlipidemia: Secondary | ICD-10-CM | POA: Diagnosis not present

## 2022-04-15 DIAGNOSIS — I480 Paroxysmal atrial fibrillation: Secondary | ICD-10-CM | POA: Diagnosis not present

## 2022-04-18 ENCOUNTER — Ambulatory Visit
Admission: RE | Admit: 2022-04-18 | Discharge: 2022-04-18 | Disposition: A | Discharge status: Home / Self Care | Payer: Medicare Other | Source: Ambulatory Visit | Attending: Obstetrics & Gynecology | Admitting: Obstetrics & Gynecology

## 2022-04-18 DIAGNOSIS — Z780 Asymptomatic menopausal state: Secondary | ICD-10-CM | POA: Diagnosis not present

## 2022-04-18 DIAGNOSIS — M810 Age-related osteoporosis without current pathological fracture: Secondary | ICD-10-CM | POA: Diagnosis not present

## 2022-04-18 DIAGNOSIS — M8588 Other specified disorders of bone density and structure, other site: Secondary | ICD-10-CM | POA: Diagnosis not present

## 2022-04-18 DIAGNOSIS — M8580 Other specified disorders of bone density and structure, unspecified site: Secondary | ICD-10-CM

## 2022-04-23 DIAGNOSIS — I4891 Unspecified atrial fibrillation: Secondary | ICD-10-CM | POA: Diagnosis not present

## 2022-04-23 DIAGNOSIS — Z8673 Personal history of transient ischemic attack (TIA), and cerebral infarction without residual deficits: Secondary | ICD-10-CM | POA: Diagnosis not present

## 2022-04-29 DIAGNOSIS — I428 Other cardiomyopathies: Secondary | ICD-10-CM | POA: Diagnosis not present

## 2022-04-29 DIAGNOSIS — I4819 Other persistent atrial fibrillation: Secondary | ICD-10-CM | POA: Diagnosis not present

## 2022-04-29 DIAGNOSIS — I10 Essential (primary) hypertension: Secondary | ICD-10-CM | POA: Diagnosis not present

## 2022-04-30 DIAGNOSIS — I639 Cerebral infarction, unspecified: Secondary | ICD-10-CM | POA: Diagnosis not present

## 2022-04-30 DIAGNOSIS — R29898 Other symptoms and signs involving the musculoskeletal system: Secondary | ICD-10-CM | POA: Diagnosis not present

## 2022-04-30 DIAGNOSIS — Z23 Encounter for immunization: Secondary | ICD-10-CM | POA: Diagnosis not present

## 2022-05-01 ENCOUNTER — Ambulatory Visit (INDEPENDENT_AMBULATORY_CARE_PROVIDER_SITE_OTHER): Payer: Medicare Other | Admitting: Obstetrics & Gynecology

## 2022-05-01 ENCOUNTER — Encounter (HOSPITAL_BASED_OUTPATIENT_CLINIC_OR_DEPARTMENT_OTHER): Payer: Self-pay | Admitting: Obstetrics & Gynecology

## 2022-05-01 VITALS — BP 165/63 | HR 55 | Ht 64.0 in | Wt 143.2 lb

## 2022-05-01 DIAGNOSIS — M810 Age-related osteoporosis without current pathological fracture: Secondary | ICD-10-CM

## 2022-05-02 DIAGNOSIS — R29898 Other symptoms and signs involving the musculoskeletal system: Secondary | ICD-10-CM | POA: Diagnosis not present

## 2022-05-02 DIAGNOSIS — I639 Cerebral infarction, unspecified: Secondary | ICD-10-CM | POA: Diagnosis not present

## 2022-05-04 NOTE — Progress Notes (Signed)
GYNECOLOGY  VISIT  CC:   discuss BMD  HPI: 76 y.o. G3P0012 Married White or Caucasian female here for discussing of BMD obtained 04/18/2022 showing t score -2.6 in right femur.  Pt has had stroke since I saw her last.  She is using a walker today.  Husband accompanies her.  Was [facility] to [facility] on 03/30/2022.  I was unaware of this until today.  I did review medication list with pt and spouse but am not completley sure this is accurate as they do not have a list with them of her medications.  She is on amiodarone, a statin, eliquis, carvedilol and chlorthalidone as well.  She is really not interested in starting a new medication at this time.  Sh eis taking calcium.  Vit D recommended as well.  Discussed with pt and spouse importance of not falling.  Feel we should keep a close eye on this and repeat in 2 years.  She is doing PT now and hopes to get risk of the walker.       Past Medical History:  Diagnosis Date   Anemia    Axillary mass 7/99   Left   Barrett's esophagus    Blood transfusion without reported diagnosis    CARCINOMA, BREAST 1995   left   Cervical spinal stenosis    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   Headache(784.0)    HEMORRHOIDS    Hypertension    Personal history of chemotherapy    Personal history of radiation therapy     MEDS:   Current Outpatient Medications on File Prior to Visit  Medication Sig Dispense Refill   acetaminophen (TYLENOL) 500 MG tablet Take 500 mg by mouth every 6 (six) hours as needed for moderate pain or headache.     amiodarone (PACERONE) 200 MG tablet Take 200 mg by mouth daily.     apixaban (ELIQUIS) 5 MG TABS tablet Take 5 mg by mouth 2 (two) times daily.     b complex vitamins tablet Take 1 tablet by mouth 2 (two) times a week.      carvedilol (COREG) 25 MG tablet Take 1 tablet (25 mg total) by mouth daily.     chlorthalidone (HYGROTON) 25 MG tablet Take 25 mg by mouth daily.     magnesium oxide (MAG-OX) 400 MG tablet Take by mouth.      Multiple Minerals (CALCIUM-MAGNESIUM-ZINC) TABS Take 1 tablet by mouth 2 (two) times a week.      vitamin B-12 (CYANOCOBALAMIN) 500 MCG tablet Take 500 mcg by mouth See admin instructions. Take 500 mcg 5 days weekly, takes on non b complex days     vitamin C (ASCORBIC ACID) 500 MG tablet Take 500 mg by mouth 2 (two) times daily.      Vitamin D, Ergocalciferol, (DRISDOL) 50000 UNITS CAPS Take 50,000 Units by mouth every Wednesday.      vitamin E 400 UNIT capsule Take 400 Units by mouth daily.     No current facility-administered medications on file prior to visit.    ALLERGIES: Sacubitril-valsartan, Carvedilol, Levofloxacin, Methocarbamol, and Tape  SH:  married, non smoker  Review of Systems  Constitutional: Negative.     PHYSICAL EXAMINATION:    BP (!) 165/63 (BP Location: Right Arm, Patient Position: Sitting, Cuff Size: Normal)   Pulse (!) 55   Ht '5\' 4"'$  (1.626 m) Comment: Reported  Wt 143 lb 3.2 oz (65 kg)   LMP 07/30/1995   BMI 24.58 kg/m  General appearance: alert, cooperative and appears stated age No exam performed today  Assessment/Plan: 1. Age-related osteoporosis without current pathological fracture - will repeat BMD in 2 years.  Pt will continue to take calcium and add Vit D.  Knows to call with any changes so we can readdress this earlier if needed.

## 2022-05-07 DIAGNOSIS — I6350 Cerebral infarction due to unspecified occlusion or stenosis of unspecified cerebral artery: Secondary | ICD-10-CM | POA: Diagnosis not present

## 2022-05-07 DIAGNOSIS — R41841 Cognitive communication deficit: Secondary | ICD-10-CM | POA: Diagnosis not present

## 2022-05-10 DIAGNOSIS — I6350 Cerebral infarction due to unspecified occlusion or stenosis of unspecified cerebral artery: Secondary | ICD-10-CM | POA: Diagnosis not present

## 2022-05-10 DIAGNOSIS — R41841 Cognitive communication deficit: Secondary | ICD-10-CM | POA: Diagnosis not present

## 2022-05-14 DIAGNOSIS — R41841 Cognitive communication deficit: Secondary | ICD-10-CM | POA: Diagnosis not present

## 2022-05-14 DIAGNOSIS — I6350 Cerebral infarction due to unspecified occlusion or stenosis of unspecified cerebral artery: Secondary | ICD-10-CM | POA: Diagnosis not present

## 2022-05-15 DIAGNOSIS — I639 Cerebral infarction, unspecified: Secondary | ICD-10-CM | POA: Diagnosis not present

## 2022-05-15 DIAGNOSIS — Z8673 Personal history of transient ischemic attack (TIA), and cerebral infarction without residual deficits: Secondary | ICD-10-CM | POA: Diagnosis not present

## 2022-05-15 DIAGNOSIS — I10 Essential (primary) hypertension: Secondary | ICD-10-CM | POA: Diagnosis not present

## 2022-05-15 DIAGNOSIS — Z79899 Other long term (current) drug therapy: Secondary | ICD-10-CM | POA: Diagnosis not present

## 2022-05-15 DIAGNOSIS — R42 Dizziness and giddiness: Secondary | ICD-10-CM | POA: Diagnosis not present

## 2022-05-16 DIAGNOSIS — R41841 Cognitive communication deficit: Secondary | ICD-10-CM | POA: Diagnosis not present

## 2022-05-16 DIAGNOSIS — I6350 Cerebral infarction due to unspecified occlusion or stenosis of unspecified cerebral artery: Secondary | ICD-10-CM | POA: Diagnosis not present

## 2022-05-20 DIAGNOSIS — R41841 Cognitive communication deficit: Secondary | ICD-10-CM | POA: Diagnosis not present

## 2022-05-20 DIAGNOSIS — I6350 Cerebral infarction due to unspecified occlusion or stenosis of unspecified cerebral artery: Secondary | ICD-10-CM | POA: Diagnosis not present

## 2022-05-22 DIAGNOSIS — I6350 Cerebral infarction due to unspecified occlusion or stenosis of unspecified cerebral artery: Secondary | ICD-10-CM | POA: Diagnosis not present

## 2022-05-23 DIAGNOSIS — M461 Sacroiliitis, not elsewhere classified: Secondary | ICD-10-CM | POA: Diagnosis not present

## 2022-05-27 DIAGNOSIS — I6350 Cerebral infarction due to unspecified occlusion or stenosis of unspecified cerebral artery: Secondary | ICD-10-CM | POA: Diagnosis not present

## 2022-05-29 DIAGNOSIS — S0990XA Unspecified injury of head, initial encounter: Secondary | ICD-10-CM | POA: Diagnosis not present

## 2022-05-29 DIAGNOSIS — Z041 Encounter for examination and observation following transport accident: Secondary | ICD-10-CM | POA: Diagnosis not present

## 2022-05-29 DIAGNOSIS — I6350 Cerebral infarction due to unspecified occlusion or stenosis of unspecified cerebral artery: Secondary | ICD-10-CM | POA: Diagnosis not present

## 2022-06-04 DIAGNOSIS — I6350 Cerebral infarction due to unspecified occlusion or stenosis of unspecified cerebral artery: Secondary | ICD-10-CM | POA: Diagnosis not present

## 2022-06-06 DIAGNOSIS — I6350 Cerebral infarction due to unspecified occlusion or stenosis of unspecified cerebral artery: Secondary | ICD-10-CM | POA: Diagnosis not present

## 2022-06-13 DIAGNOSIS — I480 Paroxysmal atrial fibrillation: Secondary | ICD-10-CM | POA: Diagnosis not present

## 2022-06-13 DIAGNOSIS — I10 Essential (primary) hypertension: Secondary | ICD-10-CM | POA: Diagnosis not present

## 2022-06-13 DIAGNOSIS — I6350 Cerebral infarction due to unspecified occlusion or stenosis of unspecified cerebral artery: Secondary | ICD-10-CM | POA: Diagnosis not present

## 2022-06-13 DIAGNOSIS — I5022 Chronic systolic (congestive) heart failure: Secondary | ICD-10-CM | POA: Diagnosis not present

## 2022-06-25 DIAGNOSIS — I4891 Unspecified atrial fibrillation: Secondary | ICD-10-CM | POA: Diagnosis not present

## 2022-06-27 DIAGNOSIS — H04123 Dry eye syndrome of bilateral lacrimal glands: Secondary | ICD-10-CM | POA: Diagnosis not present

## 2022-06-27 DIAGNOSIS — I69398 Other sequelae of cerebral infarction: Secondary | ICD-10-CM | POA: Diagnosis not present

## 2022-06-27 DIAGNOSIS — H25813 Combined forms of age-related cataract, bilateral: Secondary | ICD-10-CM | POA: Diagnosis not present

## 2022-06-27 DIAGNOSIS — I639 Cerebral infarction, unspecified: Secondary | ICD-10-CM | POA: Diagnosis not present

## 2022-06-27 DIAGNOSIS — H353132 Nonexudative age-related macular degeneration, bilateral, intermediate dry stage: Secondary | ICD-10-CM | POA: Diagnosis not present

## 2022-06-27 DIAGNOSIS — Z87891 Personal history of nicotine dependence: Secondary | ICD-10-CM | POA: Diagnosis not present

## 2022-06-27 DIAGNOSIS — H538 Other visual disturbances: Secondary | ICD-10-CM | POA: Diagnosis not present

## 2022-07-04 DIAGNOSIS — I10 Essential (primary) hypertension: Secondary | ICD-10-CM | POA: Diagnosis not present

## 2022-07-04 DIAGNOSIS — I480 Paroxysmal atrial fibrillation: Secondary | ICD-10-CM | POA: Diagnosis not present

## 2022-07-04 DIAGNOSIS — I5022 Chronic systolic (congestive) heart failure: Secondary | ICD-10-CM | POA: Diagnosis not present

## 2022-07-08 ENCOUNTER — Encounter: Payer: Self-pay | Admitting: Physician Assistant

## 2022-07-08 DIAGNOSIS — Z79899 Other long term (current) drug therapy: Secondary | ICD-10-CM | POA: Diagnosis not present

## 2022-07-08 DIAGNOSIS — Z853 Personal history of malignant neoplasm of breast: Secondary | ICD-10-CM | POA: Diagnosis not present

## 2022-07-08 DIAGNOSIS — Z888 Allergy status to other drugs, medicaments and biological substances status: Secondary | ICD-10-CM | POA: Diagnosis not present

## 2022-07-08 DIAGNOSIS — Z7901 Long term (current) use of anticoagulants: Secondary | ICD-10-CM | POA: Diagnosis not present

## 2022-07-08 DIAGNOSIS — I10 Essential (primary) hypertension: Secondary | ICD-10-CM | POA: Diagnosis not present

## 2022-07-08 DIAGNOSIS — I4891 Unspecified atrial fibrillation: Secondary | ICD-10-CM | POA: Diagnosis not present

## 2022-07-08 DIAGNOSIS — R102 Pelvic and perineal pain: Secondary | ICD-10-CM | POA: Diagnosis not present

## 2022-07-08 DIAGNOSIS — Z87891 Personal history of nicotine dependence: Secondary | ICD-10-CM | POA: Diagnosis not present

## 2022-07-08 DIAGNOSIS — Z79890 Hormone replacement therapy (postmenopausal): Secondary | ICD-10-CM | POA: Diagnosis not present

## 2022-07-08 DIAGNOSIS — Z85038 Personal history of other malignant neoplasm of large intestine: Secondary | ICD-10-CM | POA: Diagnosis not present

## 2022-07-08 DIAGNOSIS — L02215 Cutaneous abscess of perineum: Secondary | ICD-10-CM | POA: Diagnosis not present

## 2022-07-08 DIAGNOSIS — Z7982 Long term (current) use of aspirin: Secondary | ICD-10-CM | POA: Diagnosis not present

## 2022-07-12 DIAGNOSIS — E039 Hypothyroidism, unspecified: Secondary | ICD-10-CM | POA: Diagnosis not present

## 2022-07-12 DIAGNOSIS — Z23 Encounter for immunization: Secondary | ICD-10-CM | POA: Diagnosis not present

## 2022-07-12 DIAGNOSIS — I679 Cerebrovascular disease, unspecified: Secondary | ICD-10-CM | POA: Diagnosis not present

## 2022-07-12 DIAGNOSIS — I428 Other cardiomyopathies: Secondary | ICD-10-CM | POA: Diagnosis not present

## 2022-07-12 DIAGNOSIS — L02214 Cutaneous abscess of groin: Secondary | ICD-10-CM | POA: Diagnosis not present

## 2022-07-12 DIAGNOSIS — I10 Essential (primary) hypertension: Secondary | ICD-10-CM | POA: Diagnosis not present

## 2022-07-17 DIAGNOSIS — R0989 Other specified symptoms and signs involving the circulatory and respiratory systems: Secondary | ICD-10-CM | POA: Diagnosis not present

## 2022-07-17 DIAGNOSIS — H9313 Tinnitus, bilateral: Secondary | ICD-10-CM | POA: Diagnosis not present

## 2022-07-17 DIAGNOSIS — F419 Anxiety disorder, unspecified: Secondary | ICD-10-CM | POA: Insufficient documentation

## 2022-07-17 DIAGNOSIS — E7841 Elevated Lipoprotein(a): Secondary | ICD-10-CM | POA: Diagnosis not present

## 2022-07-17 DIAGNOSIS — I10 Essential (primary) hypertension: Secondary | ICD-10-CM | POA: Diagnosis not present

## 2022-07-17 DIAGNOSIS — I639 Cerebral infarction, unspecified: Secondary | ICD-10-CM | POA: Diagnosis not present

## 2022-07-30 DIAGNOSIS — I4819 Other persistent atrial fibrillation: Secondary | ICD-10-CM | POA: Diagnosis not present

## 2022-07-30 DIAGNOSIS — I428 Other cardiomyopathies: Secondary | ICD-10-CM | POA: Diagnosis not present

## 2022-07-30 DIAGNOSIS — I10 Essential (primary) hypertension: Secondary | ICD-10-CM | POA: Diagnosis not present

## 2022-07-31 DIAGNOSIS — I10 Essential (primary) hypertension: Secondary | ICD-10-CM | POA: Diagnosis not present

## 2022-08-02 DIAGNOSIS — I4819 Other persistent atrial fibrillation: Secondary | ICD-10-CM | POA: Diagnosis not present

## 2022-08-02 DIAGNOSIS — Z8673 Personal history of transient ischemic attack (TIA), and cerebral infarction without residual deficits: Secondary | ICD-10-CM | POA: Diagnosis not present

## 2022-08-02 DIAGNOSIS — I428 Other cardiomyopathies: Secondary | ICD-10-CM | POA: Diagnosis not present

## 2022-08-04 DIAGNOSIS — Z87891 Personal history of nicotine dependence: Secondary | ICD-10-CM | POA: Diagnosis not present

## 2022-08-04 DIAGNOSIS — Z888 Allergy status to other drugs, medicaments and biological substances status: Secondary | ICD-10-CM | POA: Diagnosis not present

## 2022-08-04 DIAGNOSIS — R739 Hyperglycemia, unspecified: Secondary | ICD-10-CM | POA: Diagnosis not present

## 2022-08-04 DIAGNOSIS — Z8673 Personal history of transient ischemic attack (TIA), and cerebral infarction without residual deficits: Secondary | ICD-10-CM | POA: Diagnosis not present

## 2022-08-04 DIAGNOSIS — Z79899 Other long term (current) drug therapy: Secondary | ICD-10-CM | POA: Diagnosis not present

## 2022-08-04 DIAGNOSIS — Z885 Allergy status to narcotic agent status: Secondary | ICD-10-CM | POA: Diagnosis not present

## 2022-08-04 DIAGNOSIS — Z79890 Hormone replacement therapy (postmenopausal): Secondary | ICD-10-CM | POA: Diagnosis not present

## 2022-08-04 DIAGNOSIS — R202 Paresthesia of skin: Secondary | ICD-10-CM | POA: Diagnosis not present

## 2022-08-04 DIAGNOSIS — G319 Degenerative disease of nervous system, unspecified: Secondary | ICD-10-CM | POA: Diagnosis not present

## 2022-08-04 DIAGNOSIS — E876 Hypokalemia: Secondary | ICD-10-CM | POA: Diagnosis not present

## 2022-08-04 DIAGNOSIS — R42 Dizziness and giddiness: Secondary | ICD-10-CM | POA: Diagnosis not present

## 2022-08-04 DIAGNOSIS — Z91048 Other nonmedicinal substance allergy status: Secondary | ICD-10-CM | POA: Diagnosis not present

## 2022-08-04 DIAGNOSIS — R200 Anesthesia of skin: Secondary | ICD-10-CM | POA: Diagnosis not present

## 2022-08-04 DIAGNOSIS — Z7901 Long term (current) use of anticoagulants: Secondary | ICD-10-CM | POA: Diagnosis not present

## 2022-08-04 DIAGNOSIS — E039 Hypothyroidism, unspecified: Secondary | ICD-10-CM | POA: Insufficient documentation

## 2022-08-04 DIAGNOSIS — I480 Paroxysmal atrial fibrillation: Secondary | ICD-10-CM | POA: Insufficient documentation

## 2022-08-04 DIAGNOSIS — E871 Hypo-osmolality and hyponatremia: Secondary | ICD-10-CM | POA: Diagnosis not present

## 2022-08-04 DIAGNOSIS — Z7982 Long term (current) use of aspirin: Secondary | ICD-10-CM | POA: Diagnosis not present

## 2022-08-04 DIAGNOSIS — R29818 Other symptoms and signs involving the nervous system: Secondary | ICD-10-CM | POA: Diagnosis not present

## 2022-08-04 DIAGNOSIS — R9082 White matter disease, unspecified: Secondary | ICD-10-CM | POA: Diagnosis not present

## 2022-08-04 DIAGNOSIS — E038 Other specified hypothyroidism: Secondary | ICD-10-CM | POA: Diagnosis not present

## 2022-08-04 DIAGNOSIS — Z881 Allergy status to other antibiotic agents status: Secondary | ICD-10-CM | POA: Diagnosis not present

## 2022-08-04 DIAGNOSIS — Z1152 Encounter for screening for COVID-19: Secondary | ICD-10-CM | POA: Diagnosis not present

## 2022-08-04 DIAGNOSIS — I6781 Acute cerebrovascular insufficiency: Secondary | ICD-10-CM | POA: Diagnosis not present

## 2022-08-04 DIAGNOSIS — I10 Essential (primary) hypertension: Secondary | ICD-10-CM | POA: Diagnosis not present

## 2022-08-04 DIAGNOSIS — I63433 Cerebral infarction due to embolism of bilateral posterior cerebral arteries: Secondary | ICD-10-CM | POA: Diagnosis not present

## 2022-08-05 DIAGNOSIS — I480 Paroxysmal atrial fibrillation: Secondary | ICD-10-CM | POA: Diagnosis not present

## 2022-08-05 DIAGNOSIS — E038 Other specified hypothyroidism: Secondary | ICD-10-CM | POA: Diagnosis not present

## 2022-08-05 DIAGNOSIS — I6781 Acute cerebrovascular insufficiency: Secondary | ICD-10-CM | POA: Diagnosis not present

## 2022-08-05 DIAGNOSIS — I10 Essential (primary) hypertension: Secondary | ICD-10-CM | POA: Diagnosis not present

## 2022-08-05 DIAGNOSIS — G319 Degenerative disease of nervous system, unspecified: Secondary | ICD-10-CM | POA: Diagnosis not present

## 2022-08-05 DIAGNOSIS — Z8673 Personal history of transient ischemic attack (TIA), and cerebral infarction without residual deficits: Secondary | ICD-10-CM | POA: Diagnosis not present

## 2022-08-05 DIAGNOSIS — R9082 White matter disease, unspecified: Secondary | ICD-10-CM | POA: Diagnosis not present

## 2022-08-05 DIAGNOSIS — I4891 Unspecified atrial fibrillation: Secondary | ICD-10-CM | POA: Diagnosis not present

## 2022-08-08 DIAGNOSIS — I4891 Unspecified atrial fibrillation: Secondary | ICD-10-CM | POA: Diagnosis not present

## 2022-08-08 DIAGNOSIS — I749 Embolism and thrombosis of unspecified artery: Secondary | ICD-10-CM | POA: Diagnosis not present

## 2022-08-08 DIAGNOSIS — I69322 Dysarthria following cerebral infarction: Secondary | ICD-10-CM | POA: Diagnosis not present

## 2022-08-08 DIAGNOSIS — G459 Transient cerebral ischemic attack, unspecified: Secondary | ICD-10-CM | POA: Diagnosis not present

## 2022-08-08 DIAGNOSIS — I10 Essential (primary) hypertension: Secondary | ICD-10-CM | POA: Diagnosis not present

## 2022-08-09 ENCOUNTER — Encounter: Payer: Self-pay | Admitting: Physician Assistant

## 2022-08-09 ENCOUNTER — Ambulatory Visit: Payer: Medicare Other | Admitting: Physician Assistant

## 2022-08-09 VITALS — BP 120/68 | HR 68 | Ht 64.0 in | Wt 144.0 lb

## 2022-08-09 DIAGNOSIS — K5903 Drug induced constipation: Secondary | ICD-10-CM

## 2022-08-09 DIAGNOSIS — Z85038 Personal history of other malignant neoplasm of large intestine: Secondary | ICD-10-CM

## 2022-08-09 NOTE — Progress Notes (Signed)
Subjective:    Patient ID: Eileen [name], female    DOB: 1946/07/22, 77 y.o.   MRN: [id number]  HPI  Eileen [name] is a 77 year old female, established with Dr. Silverio Decamp. She was last seen here in January 2021, and has history of adenocarcinoma of the colon stage IIa diagnosed 2015. She comes in today to discuss follow-up colonoscopy.  Her last colonoscopy was done in January 2021 with finding of a 1 mm polyp in the descending colon which path showed to be benign colonic mucosa, and was noted to have a patent end-to-side ileocolonic anastomosis and internal hemorrhoids.  She is indicated at this time for 5-year interval follow-up.  She does have significant comorbidities with history of previous TIAs and CVAs.  She did have an acute CVA in September 2023 and is currently on Eliquis and aspirin. She had an ER visit last week on 08/04/2018 for with neurologic symptoms concerning for mild CVA.  She had workup done through the [facility] ER with CT and MRI with no definite new infarct found and was advised to continue on Eliquis and aspirin. She also has history of atrial fibrillation, prior ablation, cardiomyopathy with most recent echo in 2023 showing an EF of 50% and PFO.  History of congestive heart failure and hypertension.  She is not having any current GI issues other than constipation which has developed since she started on Eliquis.  Says she has a small bowel movement every day but does not feel that she is evacuating her bowel well.  She has been eating very well, says she eats plenty of fiber, has not noted any melena or hematochezia. She has tried MiraLAX in the past without any benefit but had not tried taking it regularly. She says she worries about developing a recurrent colon cancer as her previous colon cancer developed about 5 years after a prior colonoscopy.  Review of Systems. Pertinent positive and negative review of systems were noted in the above HPI section.  All other review of  systems was otherwise negative.   Outpatient Encounter Medications as of 08/09/2022  Medication Sig   apixaban (ELIQUIS) 5 MG TABS tablet Take 5 mg by mouth 2 (two) times daily.   b complex vitamins tablet Take 1 tablet by mouth 2 (two) times a week.    carvedilol (COREG) 25 MG tablet Take 1 tablet (25 mg total) by mouth daily.   losartan (COZAAR) 50 MG tablet Take 50 mg by mouth daily.   vitamin B-12 (CYANOCOBALAMIN) 500 MCG tablet Take 500 mcg by mouth See admin instructions. Take 500 mcg 5 days weekly, takes on non b complex days   vitamin C (ASCORBIC ACID) 500 MG tablet Take 500 mg by mouth 2 (two) times daily.    Vitamin D, Ergocalciferol, (DRISDOL) 50000 UNITS CAPS Take 50,000 Units by mouth every Wednesday.    vitamin E 400 UNIT capsule Take 400 Units by mouth daily.   acetaminophen (TYLENOL) 500 MG tablet Take 500 mg by mouth every 6 (six) hours as needed for moderate pain or headache. (Patient not taking: Reported on 08/09/2022)   amiodarone (PACERONE) 200 MG tablet Take 200 mg by mouth daily. (Patient not taking: Reported on 08/09/2022)   atorvastatin (LIPITOR) 80 MG tablet Take 80 mg by mouth daily.   chlorthalidone (HYGROTON) 25 MG tablet Take 25 mg by mouth daily. (Patient not taking: Reported on 08/09/2022)   magnesium oxide (MAG-OX) 400 MG tablet Take by mouth. (Patient not taking: Reported on 08/09/2022)   Multiple Minerals (  CALCIUM-MAGNESIUM-ZINC) TABS Take 1 tablet by mouth 2 (two) times a week.  (Patient not taking: Reported on 08/09/2022)   No facility-administered encounter medications on file as of 08/09/2022.   Allergies  Allergen Reactions   Sacubitril-Valsartan Anaphylaxis, Diarrhea and Nausea Only   Carvedilol Other (See Comments)    Other reaction(s): diarrhea   Levofloxacin Other (See Comments)    Red streaks to IV site when IV dose infusing and arm feeling very irritated, throbbing, painful   Methocarbamol Rash    Other reaction(s): Other   Tape     Can use paper  tape, adhesive causes redness   Patient Active Problem List   Diagnosis Date Noted   Polyp of descending colon    Chronic systolic heart failure ([place]) 05/21/2018   Cardiomyopathy ([place])    Hypertension 11/06/2017   Complete rotator cuff rupture of left shoulder 09/21/2014   Abdominal pain, epigastric 08/24/2013   Belching 08/24/2013   Other abnormal blood chemistry 01/21/2012   Nonspecific (abnormal) findings on radiological and other examination of biliary tract 01/21/2012   TIA (transient ischemic attack) 11/05/2011   PVC's (premature ventricular contractions) 10/11/2011   Nonspecific abnormal unspecified cardiovascular function study 12/20/2010   Malignant neoplasm of female breast ([place]) 08/23/2008   HEMORRHOIDS 08/23/2008   CONSTIPATION, CHRONIC 08/23/2008   RECTAL BLEEDING 08/23/2008   History of colon cancer 08/23/2008   Malignant neoplasm of colon ([place]) 01/26/2005   Social History   Socioeconomic History   Marital status: Married    Spouse name: Not on file   Number of children: 2   Years of education: Not on file   Highest education level: Not on file  Occupational History   Not on file  Tobacco Use   Smoking status: Former    Types: Cigarettes    Quit date: 12/20/1990    Years since quitting: 31.6   Smokeless tobacco: Never  Vaping Use   Vaping Use: Never used  Substance and Sexual Activity   Alcohol use: Not Currently   Drug use: No   Sexual activity: Not Currently    Birth control/protection: Surgical    Comment: hysterectomy  Other Topics Concern   Not on file  Social History Narrative   Not on file   Social Determinants of Health   Financial Resource Strain: Not on file  Food Insecurity: Not on file  Transportation Needs: Not on file  Physical Activity: Not on file  Stress: Not on file  Social Connections: Not on file  Intimate Partner Violence: Not on file    [name] family history includes Heart attack in her father; Uterine cancer in her  mother.      Objective:    Vitals:   08/09/22 1350  BP: 120/68  Pulse: 68    Physical Exam Well-developed well-nourished elderly white female in no acu 144 te distress.  Height, Weight, BMI 24.7  HEENT; nontraumatic normocephalic, EOMI, PE R LA, sclera anicteric. Oropharynx; not examined today Neck; supple, no JVD Cardiovascular; regular rate and rhythm with S1-S2, no murmur rub or gallop Pulmonary; Clear bilaterally Abdomen; soft, nontender, nondistended, no palpable mass or hepatosplenomegaly, bowel sounds are active Rectal; not done today Skin; benign exam, no jaundice rash or appreciable lesions Extremities; no clubbing cyanosis or edema skin warm and dry Neuro/Psych; alert and oriented x4, grossly nonfocal mood and affect appropriate        Assessment & Plan:   #15 77 year old white female with history of adenocarcinoma of the colon stage IIa  diagnosed 2015, status postresection. Colonoscopy in 2019, no recurrent polyps and last colonoscopy January 2021 negative other than a 1 mm polyp removed from the descending colon which by path was benign colonic mucosa. She is currently indicated for 5-year interval follow-up.   #2 constipation medication related secondary to Eliquis  #3 history of recent CVA September 2023-started on Eliquis and aspirin, and a very recent ER visit January 2024 with possible TIA/negative imaging.  #4 cardiomyopathy with EF 50% #5 PFO #6.  History of atrial fibrillation #7.  Hypertension  Plan; I do not think that she needs to have colonoscopy at this time, and given very recent CVA and then possible TIA last week we certainly would not want to interrupt Eliquis.  For constipation she was advised to start a trial of MiraLAX 17 g in 8 ounces of water every day or every other day. Increase fiber consumption, she asks about prunes which would be great, generally advice is to consume 4-5 dried prunes daily constipation. I have asked her to call if  she develops any other concerning symptoms, otherwise would plan to see her back in 1 year  with Dr Silverio Decamp and can reassess at that time for appropriateness for a follow-up colonoscopy.   Fawzi Melman Genia Harold PA-C 08/09/2022   Cc: Shirline Frees, MD

## 2022-08-09 NOTE — Patient Instructions (Signed)
_______________________________________________________  If your blood pressure at your visit was 140/90 or greater, please contact your primary care physician to follow up on this.  _______________________________________________________  If you are age 76 or older, your body mass index should be between 23-30. Your Body mass index is 24.72 kg/m. If this is out of the aforementioned range listed, please consider follow up with your Primary Care Provider.  If you are age 35 or younger, your body mass index should be between 19-25. Your Body mass index is 24.72 kg/m. If this is out of the aformentioned range listed, please consider follow up with your Primary Care Provider.   ________________________________________________________  The [place] [facility] providers would like to encourage you to use [facility] to communicate with providers for non-urgent requests or questions.  Due to long hold times on the telephone, sending your provider a message by [facility] may be a faster and more efficient way to get a response.  Please allow 48 business hours for a response.  Please remember that this is for non-urgent requests.  _______________________________________________________  Start Miralax 17grams in [place] of water or juice daily or every other day.  Follow up in 1 year with Dr Silverio Decamp, or sooner if needed.

## 2022-08-13 ENCOUNTER — Telehealth: Payer: Self-pay | Admitting: Adult Health

## 2022-08-13 NOTE — Telephone Encounter (Signed)
Called patient regarding upcoming providers departure, patient is notified. Patient rescheduled with new provider.

## 2022-08-15 DIAGNOSIS — I480 Paroxysmal atrial fibrillation: Secondary | ICD-10-CM | POA: Diagnosis not present

## 2022-08-15 DIAGNOSIS — I428 Other cardiomyopathies: Secondary | ICD-10-CM | POA: Diagnosis not present

## 2022-08-15 DIAGNOSIS — I4819 Other persistent atrial fibrillation: Secondary | ICD-10-CM | POA: Diagnosis not present

## 2022-08-15 DIAGNOSIS — I10 Essential (primary) hypertension: Secondary | ICD-10-CM | POA: Diagnosis not present

## 2022-08-19 DIAGNOSIS — R531 Weakness: Secondary | ICD-10-CM | POA: Diagnosis not present

## 2022-08-28 DIAGNOSIS — L821 Other seborrheic keratosis: Secondary | ICD-10-CM | POA: Diagnosis not present

## 2022-08-28 DIAGNOSIS — C44722 Squamous cell carcinoma of skin of right lower limb, including hip: Secondary | ICD-10-CM | POA: Diagnosis not present

## 2022-09-09 DIAGNOSIS — I4891 Unspecified atrial fibrillation: Secondary | ICD-10-CM | POA: Diagnosis not present

## 2022-09-11 DIAGNOSIS — I639 Cerebral infarction, unspecified: Secondary | ICD-10-CM | POA: Diagnosis not present

## 2022-09-11 DIAGNOSIS — F419 Anxiety disorder, unspecified: Secondary | ICD-10-CM | POA: Diagnosis not present

## 2022-09-11 DIAGNOSIS — I10 Essential (primary) hypertension: Secondary | ICD-10-CM | POA: Diagnosis not present

## 2022-09-17 DIAGNOSIS — I749 Embolism and thrombosis of unspecified artery: Secondary | ICD-10-CM | POA: Diagnosis not present

## 2022-09-17 DIAGNOSIS — I480 Paroxysmal atrial fibrillation: Secondary | ICD-10-CM | POA: Diagnosis not present

## 2022-09-17 DIAGNOSIS — G459 Transient cerebral ischemic attack, unspecified: Secondary | ICD-10-CM | POA: Diagnosis not present

## 2022-09-17 DIAGNOSIS — I10 Essential (primary) hypertension: Secondary | ICD-10-CM | POA: Diagnosis not present

## 2022-09-18 DIAGNOSIS — I480 Paroxysmal atrial fibrillation: Secondary | ICD-10-CM | POA: Diagnosis not present

## 2022-09-18 DIAGNOSIS — I10 Essential (primary) hypertension: Secondary | ICD-10-CM | POA: Diagnosis not present

## 2022-09-23 DIAGNOSIS — I679 Cerebrovascular disease, unspecified: Secondary | ICD-10-CM | POA: Diagnosis not present

## 2022-09-23 DIAGNOSIS — E7800 Pure hypercholesterolemia, unspecified: Secondary | ICD-10-CM | POA: Diagnosis not present

## 2022-09-23 DIAGNOSIS — Z853 Personal history of malignant neoplasm of breast: Secondary | ICD-10-CM | POA: Diagnosis not present

## 2022-09-23 DIAGNOSIS — E559 Vitamin D deficiency, unspecified: Secondary | ICD-10-CM | POA: Diagnosis not present

## 2022-09-23 DIAGNOSIS — E039 Hypothyroidism, unspecified: Secondary | ICD-10-CM | POA: Diagnosis not present

## 2022-09-23 DIAGNOSIS — I428 Other cardiomyopathies: Secondary | ICD-10-CM | POA: Diagnosis not present

## 2022-09-23 DIAGNOSIS — Z85038 Personal history of other malignant neoplasm of large intestine: Secondary | ICD-10-CM | POA: Diagnosis not present

## 2022-09-23 DIAGNOSIS — I10 Essential (primary) hypertension: Secondary | ICD-10-CM | POA: Diagnosis not present

## 2022-09-23 DIAGNOSIS — I480 Paroxysmal atrial fibrillation: Secondary | ICD-10-CM | POA: Diagnosis not present

## 2022-09-23 DIAGNOSIS — R351 Nocturia: Secondary | ICD-10-CM | POA: Diagnosis not present

## 2022-09-26 DIAGNOSIS — H04123 Dry eye syndrome of bilateral lacrimal glands: Secondary | ICD-10-CM | POA: Diagnosis not present

## 2022-09-26 DIAGNOSIS — H353132 Nonexudative age-related macular degeneration, bilateral, intermediate dry stage: Secondary | ICD-10-CM | POA: Diagnosis not present

## 2022-09-26 DIAGNOSIS — H25813 Combined forms of age-related cataract, bilateral: Secondary | ICD-10-CM | POA: Diagnosis not present

## 2022-09-29 DIAGNOSIS — H25813 Combined forms of age-related cataract, bilateral: Secondary | ICD-10-CM | POA: Insufficient documentation

## 2022-09-30 DIAGNOSIS — R5383 Other fatigue: Secondary | ICD-10-CM | POA: Diagnosis not present

## 2022-09-30 DIAGNOSIS — Z881 Allergy status to other antibiotic agents status: Secondary | ICD-10-CM | POA: Diagnosis not present

## 2022-09-30 DIAGNOSIS — R918 Other nonspecific abnormal finding of lung field: Secondary | ICD-10-CM | POA: Diagnosis not present

## 2022-09-30 DIAGNOSIS — R0602 Shortness of breath: Secondary | ICD-10-CM | POA: Diagnosis not present

## 2022-09-30 DIAGNOSIS — Z86718 Personal history of other venous thrombosis and embolism: Secondary | ICD-10-CM | POA: Diagnosis not present

## 2022-09-30 DIAGNOSIS — I482 Chronic atrial fibrillation, unspecified: Secondary | ICD-10-CM | POA: Diagnosis not present

## 2022-09-30 DIAGNOSIS — R000 Tachycardia, unspecified: Secondary | ICD-10-CM | POA: Diagnosis not present

## 2022-09-30 DIAGNOSIS — Z853 Personal history of malignant neoplasm of breast: Secondary | ICD-10-CM | POA: Diagnosis not present

## 2022-09-30 DIAGNOSIS — Z885 Allergy status to narcotic agent status: Secondary | ICD-10-CM | POA: Diagnosis not present

## 2022-09-30 DIAGNOSIS — Z7901 Long term (current) use of anticoagulants: Secondary | ICD-10-CM | POA: Diagnosis not present

## 2022-09-30 DIAGNOSIS — I110 Hypertensive heart disease with heart failure: Secondary | ICD-10-CM | POA: Diagnosis not present

## 2022-09-30 DIAGNOSIS — I4891 Unspecified atrial fibrillation: Secondary | ICD-10-CM | POA: Diagnosis not present

## 2022-09-30 DIAGNOSIS — E039 Hypothyroidism, unspecified: Secondary | ICD-10-CM | POA: Diagnosis not present

## 2022-09-30 DIAGNOSIS — I10 Essential (primary) hypertension: Secondary | ICD-10-CM | POA: Diagnosis not present

## 2022-09-30 DIAGNOSIS — Z888 Allergy status to other drugs, medicaments and biological substances status: Secondary | ICD-10-CM | POA: Diagnosis not present

## 2022-09-30 DIAGNOSIS — Z8673 Personal history of transient ischemic attack (TIA), and cerebral infarction without residual deficits: Secondary | ICD-10-CM | POA: Diagnosis not present

## 2022-09-30 DIAGNOSIS — R531 Weakness: Secondary | ICD-10-CM | POA: Diagnosis not present

## 2022-09-30 DIAGNOSIS — Z7982 Long term (current) use of aspirin: Secondary | ICD-10-CM | POA: Diagnosis not present

## 2022-09-30 DIAGNOSIS — Z87891 Personal history of nicotine dependence: Secondary | ICD-10-CM | POA: Diagnosis not present

## 2022-09-30 DIAGNOSIS — I5022 Chronic systolic (congestive) heart failure: Secondary | ICD-10-CM | POA: Diagnosis not present

## 2022-09-30 DIAGNOSIS — Z79899 Other long term (current) drug therapy: Secondary | ICD-10-CM | POA: Diagnosis not present

## 2022-09-30 DIAGNOSIS — Z85038 Personal history of other malignant neoplasm of large intestine: Secondary | ICD-10-CM | POA: Diagnosis not present

## 2022-10-01 DIAGNOSIS — I4891 Unspecified atrial fibrillation: Secondary | ICD-10-CM | POA: Diagnosis not present

## 2022-10-01 DIAGNOSIS — I082 Rheumatic disorders of both aortic and tricuspid valves: Secondary | ICD-10-CM | POA: Diagnosis not present

## 2022-10-08 DIAGNOSIS — I428 Other cardiomyopathies: Secondary | ICD-10-CM | POA: Diagnosis not present

## 2022-10-08 DIAGNOSIS — I480 Paroxysmal atrial fibrillation: Secondary | ICD-10-CM | POA: Diagnosis not present

## 2022-10-08 DIAGNOSIS — G459 Transient cerebral ischemic attack, unspecified: Secondary | ICD-10-CM | POA: Diagnosis not present

## 2022-10-08 DIAGNOSIS — I749 Embolism and thrombosis of unspecified artery: Secondary | ICD-10-CM | POA: Diagnosis not present

## 2022-10-08 DIAGNOSIS — I10 Essential (primary) hypertension: Secondary | ICD-10-CM | POA: Diagnosis not present

## 2022-10-11 DIAGNOSIS — E7800 Pure hypercholesterolemia, unspecified: Secondary | ICD-10-CM | POA: Diagnosis not present

## 2022-10-21 DIAGNOSIS — I4891 Unspecified atrial fibrillation: Secondary | ICD-10-CM | POA: Diagnosis not present

## 2022-10-23 ENCOUNTER — Other Ambulatory Visit: Payer: Self-pay | Admitting: *Deleted

## 2022-10-23 DIAGNOSIS — C189 Malignant neoplasm of colon, unspecified: Secondary | ICD-10-CM

## 2022-10-24 ENCOUNTER — Inpatient Hospital Stay: Payer: Medicare Other | Attending: Adult Health

## 2022-10-24 ENCOUNTER — Ambulatory Visit: Payer: Medicare Other | Admitting: Nurse Practitioner

## 2022-10-24 ENCOUNTER — Inpatient Hospital Stay: Payer: Medicare Other

## 2022-10-24 ENCOUNTER — Other Ambulatory Visit: Payer: Medicare Other

## 2022-10-24 ENCOUNTER — Ambulatory Visit: Payer: Medicare Other | Admitting: Oncology

## 2022-10-24 ENCOUNTER — Other Ambulatory Visit: Payer: Self-pay

## 2022-10-24 ENCOUNTER — Inpatient Hospital Stay (HOSPITAL_BASED_OUTPATIENT_CLINIC_OR_DEPARTMENT_OTHER): Payer: Medicare Other | Admitting: Adult Health

## 2022-10-24 VITALS — BP 162/74 | HR 60 | Temp 97.7°F | Resp 18 | Ht 64.0 in | Wt 146.1 lb

## 2022-10-24 DIAGNOSIS — Z9484 Stem cells transplant status: Secondary | ICD-10-CM | POA: Diagnosis not present

## 2022-10-24 DIAGNOSIS — Z923 Personal history of irradiation: Secondary | ICD-10-CM | POA: Insufficient documentation

## 2022-10-24 DIAGNOSIS — Z85038 Personal history of other malignant neoplasm of large intestine: Secondary | ICD-10-CM | POA: Insufficient documentation

## 2022-10-24 DIAGNOSIS — C189 Malignant neoplasm of colon, unspecified: Secondary | ICD-10-CM

## 2022-10-24 DIAGNOSIS — Z87891 Personal history of nicotine dependence: Secondary | ICD-10-CM | POA: Diagnosis not present

## 2022-10-24 DIAGNOSIS — I110 Hypertensive heart disease with heart failure: Secondary | ICD-10-CM | POA: Insufficient documentation

## 2022-10-24 DIAGNOSIS — Z9221 Personal history of antineoplastic chemotherapy: Secondary | ICD-10-CM | POA: Diagnosis not present

## 2022-10-24 DIAGNOSIS — I5022 Chronic systolic (congestive) heart failure: Secondary | ICD-10-CM | POA: Insufficient documentation

## 2022-10-24 DIAGNOSIS — C50919 Malignant neoplasm of unspecified site of unspecified female breast: Secondary | ICD-10-CM

## 2022-10-24 DIAGNOSIS — Z8049 Family history of malignant neoplasm of other genital organs: Secondary | ICD-10-CM | POA: Diagnosis not present

## 2022-10-24 DIAGNOSIS — Z853 Personal history of malignant neoplasm of breast: Secondary | ICD-10-CM

## 2022-10-24 LAB — CMP (CANCER CENTER ONLY)
ALT: 24 U/L (ref 0–44)
AST: 22 U/L (ref 15–41)
Albumin: 4.1 g/dL (ref 3.5–5.0)
Alkaline Phosphatase: 76 U/L (ref 38–126)
Anion gap: 6 (ref 5–15)
BUN: 25 mg/dL — ABNORMAL HIGH (ref 8–23)
CO2: 32 mmol/L (ref 22–32)
Calcium: 9.3 mg/dL (ref 8.9–10.3)
Chloride: 101 mmol/L (ref 98–111)
Creatinine: 1.03 mg/dL — ABNORMAL HIGH (ref 0.44–1.00)
GFR, Estimated: 57 mL/min — ABNORMAL LOW (ref >60)
Glucose, Bld: 101 mg/dL — ABNORMAL HIGH (ref 70–99)
Potassium: 4.2 mmol/L (ref 3.5–5.1)
Sodium: 139 mmol/L (ref 135–145)
Total Bilirubin: 0.7 mg/dL (ref 0.3–1.2)
Total Protein: 6.6 g/dL (ref 6.5–8.1)

## 2022-10-24 LAB — CBC WITH DIFFERENTIAL (CANCER CENTER ONLY)
Abs Immature Granulocytes: 0.02 10*3/uL (ref 0.00–0.07)
Basophils Absolute: 0.1 10*3/uL (ref 0.0–0.1)
Basophils Relative: 1 %
Eosinophils Absolute: 0.1 10*3/uL (ref 0.0–0.5)
Eosinophils Relative: 1 %
HCT: 39.9 % (ref 36.0–46.0)
Hemoglobin: 13 g/dL (ref 12.0–15.0)
Immature Granulocytes: 0 %
Lymphocytes Relative: 28 %
Lymphs Abs: 2.3 10*3/uL (ref 0.7–4.0)
MCH: 28.6 pg (ref 26.0–34.0)
MCHC: 32.6 g/dL (ref 30.0–36.0)
MCV: 87.9 fL (ref 80.0–100.0)
Monocytes Absolute: 0.6 10*3/uL (ref 0.1–1.0)
Monocytes Relative: 8 %
Neutro Abs: 5.1 10*3/uL (ref 1.7–7.7)
Neutrophils Relative %: 62 %
Platelet Count: 218 10*3/uL (ref 150–400)
RBC: 4.54 MIL/uL (ref 3.87–5.11)
RDW: 13.1 % (ref 11.5–15.5)
WBC Count: 8.1 10*3/uL (ref 4.0–10.5)
nRBC: 0 % (ref 0.0–0.2)

## 2022-10-24 LAB — CEA (ACCESS): CEA (CHCC): 2.7 ng/mL (ref 0.00–5.00)

## 2022-10-24 NOTE — Progress Notes (Signed)
[facility] Follow up:    Eileen Frees, MD [address] [place] [address]   DIAGNOSIS:   1.  Breast cancer diagnosed in 1995.  She was found to have locally advanced breast cancer treated with high-dose chemotherapy with stem cell transplant.     2.  Stage IIa colon cancer diagnosed in 2006.   SUMMARY OF ONCOLOGIC HISTORY: 1. She is status post lumpectomy and lymph node dissection followed by [facility] and radiation therapy. 2. She is status post 4 cycles of Adriamycin and Taxotere followed by stem cell transplantation at [facility] on 08/16/1997 due to recurrence and the left supraclavicular lymph node. Her tumor is ER negative PR positive. 3. She is status post right colectomy for stage IIA adenocarcinoma of the colon in October of 2006. She is status post 10 cycles of FOLFOX concluded in 06/2005 4. Femara taken between 2001 and 2017.  CURRENT THERAPY: Observation  INTERVAL HISTORY: Eileen [name] 77 y.o. female returns for follow-up of her history of breast cancer from 61 and colon cancer from 2006.  Her most recent bone density testing occurred on April 18, 2022 demonstrating osteoporosis with a T-score of -2.6 in the right femur.  Underwent bilateral breast screening mammogram on January 30, 2022 that demonstrated no mammographic evidence of malignancy and breast density category C.  Her most recent colonoscopy occurred on August 10, 2019 with Dr. Silverio Decamp.    She tells me that she is doing well today.  She requests to undergo tumor marker testing with CA27-29 and CEA.     Patient Active Problem List   Diagnosis Date Noted   Polyp of descending colon    Chronic systolic heart failure ([place]) 05/21/2018   Cardiomyopathy ([place])    Hypertension 11/06/2017   Complete rotator cuff rupture of left shoulder 09/21/2014   Abdominal pain, epigastric 08/24/2013   Belching 08/24/2013   Other abnormal blood chemistry 01/21/2012   Nonspecific  (abnormal) findings on radiological and other examination of biliary tract 01/21/2012   TIA (transient ischemic attack) 11/05/2011   PVC's (premature ventricular contractions) 10/11/2011   Nonspecific abnormal unspecified cardiovascular function study 12/20/2010   History of breast cancer 08/23/2008   HEMORRHOIDS 08/23/2008   CONSTIPATION, CHRONIC 08/23/2008   RECTAL BLEEDING 08/23/2008   History of colon cancer 08/23/2008    is allergic to sacubitril-valsartan, carvedilol, levofloxacin, methocarbamol, tape, and tramadol.  MEDICAL HISTORY: Past Medical History:  Diagnosis Date   Anemia    Axillary mass 01/1998   Left   Barrett's esophagus    Blood transfusion without reported diagnosis    CARCINOMA, BREAST 1995   left   Cervical spinal stenosis    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   Headache(784.0)    HEMORRHOIDS    Hypertension    Malignant neoplasm of colon ([place]) 01/26/2005   Qualifier: History of   By: Sharol Roussel       Personal history of chemotherapy    Personal history of radiation therapy     SURGICAL HISTORY: Past Surgical History:  Procedure Laterality Date   BIOPSY  08/10/2019   Procedure: BIOPSY;  Surgeon: Mauri Pole, MD;  Location: [facility];  Service: Endoscopy;;   BONE MARROW TRANSPLANT  1999   BREAST LUMPECTOMY Left 11/95   left breast   BUNIONECTOMY  3/09, 10/09   right and left   COLON SURGERY     COLONOSCOPY     COLONOSCOPY WITH PROPOFOL [name]  08/10/2019   Procedure: COLONOSCOPY WITH PROPOFOL;  Surgeon: Mauri Pole, MD;  Location: [facility];  Service: Endoscopy;  Laterality: [name];   ERCP  01/21/2012   Procedure: ENDOSCOPIC RETROGRADE CHOLANGIOPANCREATOGRAPHY (ERCP);  Surgeon: Lafayette Dragon, MD;  Location: [facility];  Service: Endoscopy;  Laterality: [name];   hemicolectomy  01/2005   right   HYSTEROSCOPY  08/30/94   INGUINAL HERNIA REPAIR     LEFT HEART CATH AND CORONARY ANGIOGRAPHY [name] 05/21/2018   Procedure: LEFT HEART  CATH AND CORONARY ANGIOGRAPHY;  Surgeon: Nelva Bush, MD;  Location: [facility];  Service: Cardiovascular;  Laterality: [name];   NECK MASS EXCISION Left 7/99   ROTATOR CUFF REPAIR Right 09/08/13   full tear   TOTAL ABDOMINAL HYSTERECTOMY W/ BILATERAL SALPINGOOPHORECTOMY  1996   TUNNELED VENOUS CATHETER PLACEMENT  9/98    SOCIAL HISTORY: Social History   Socioeconomic History   Marital status: Married    Spouse name: Not on file   Number of children: 2   Years of education: Not on file   Highest education level: Not on file  Occupational History   Not on file  Tobacco Use   Smoking status: Former    Types: Cigarettes    Quit date: 12/20/1990    Years since quitting: 31.8   Smokeless tobacco: Never  Vaping Use   Vaping Use: Never used  Substance and Sexual Activity   Alcohol use: Not Currently   Drug use: No   Sexual activity: Not Currently    Birth control/protection: Surgical    Comment: hysterectomy  Other Topics Concern   Not on file  Social History Narrative   Not on file   Social Determinants of Health   Financial Resource Strain: Not on file  Food Insecurity: Not on file  Transportation Needs: Not on file  Physical Activity: Not on file  Stress: Not on file  Social Connections: Not on file  Intimate Partner Violence: Not on file    FAMILY HISTORY: Family History  Problem Relation Age of Onset   Uterine cancer Mother    Heart attack Father    Colon cancer Neg Hx    Esophageal cancer Neg Hx    Stomach cancer Neg Hx    Rectal cancer Neg Hx     Review of Systems  Constitutional:  Negative for appetite change, chills, fatigue, fever and unexpected weight change.  HENT:   Negative for hearing loss, lump/mass and trouble swallowing.   Eyes:  Negative for eye problems and icterus.  Respiratory:  Negative for chest tightness, cough and shortness of breath.   Cardiovascular:  Negative for chest pain, leg swelling and palpitations.   Gastrointestinal:  Negative for abdominal distention, abdominal pain, constipation, diarrhea, nausea and vomiting.  Endocrine: Negative for hot flashes.  Genitourinary:  Negative for difficulty urinating.   Musculoskeletal:  Negative for arthralgias.  Skin:  Negative for itching and rash.  Neurological:  Negative for dizziness, extremity weakness, headaches and numbness.  Hematological:  Negative for adenopathy. Does not bruise/bleed easily.  Psychiatric/Behavioral:  Negative for depression. The patient is not nervous/anxious.       PHYSICAL EXAMINATION  ECOG PERFORMANCE STATUS: 0 - Asymptomatic  Vitals:   10/24/22 1352  BP: (!) 162/74  Pulse: 60  Resp: 18  Temp: 97.7 F (36.5 C)  SpO2: 97%    Physical Exam Constitutional:      General: She is not in acute distress.    Appearance: Normal appearance. She is not  toxic-appearing.  HENT:     Head: Normocephalic and atraumatic.  Eyes:     General: No scleral icterus. Cardiovascular:     Rate and Rhythm: Normal rate and regular rhythm.     Pulses: Normal pulses.     Heart sounds: Normal heart sounds.  Pulmonary:     Effort: Pulmonary effort is normal.     Breath sounds: Normal breath sounds.  Chest:     Comments: Bilateral breast examined, no nodules, masses, axillary adenopathy or sign of breast cancer recurrence. Abdominal:     General: Abdomen is flat. Bowel sounds are normal. There is no distension.     Palpations: Abdomen is soft.     Tenderness: There is no abdominal tenderness.  Musculoskeletal:        General: No swelling.     Cervical back: Neck supple.  Lymphadenopathy:     Cervical: No cervical adenopathy.  Skin:    General: Skin is warm and dry.     Findings: No rash.  Neurological:     General: No focal deficit present.     Mental Status: She is alert.  Psychiatric:        Mood and Affect: Mood normal.        Behavior: Behavior normal.     LABORATORY DATA:  CBC    Component Value Date/Time    WBC 8.1 10/24/2022 1313   WBC 8.9 08/29/2019 0851   RBC 4.54 10/24/2022 1313   HGB 13.0 10/24/2022 1313   HGB 13.3 05/19/2018 1153   HGB 12.9 03/12/2017 1116   HCT 39.9 10/24/2022 1313   HCT 41.3 05/19/2018 1153   HCT 39.2 03/12/2017 1116   PLT 218 10/24/2022 1313   PLT 240 05/19/2018 1153   MCV 87.9 10/24/2022 1313   MCV 87 05/19/2018 1153   MCV 86.6 03/12/2017 1116   MCH 28.6 10/24/2022 1313   MCHC 32.6 10/24/2022 1313   RDW 13.1 10/24/2022 1313   RDW 12.2 (L) 05/19/2018 1153   RDW 13.3 03/12/2017 1116   LYMPHSABS 2.3 10/24/2022 1313   LYMPHSABS 1.6 03/12/2017 1116   MONOABS 0.6 10/24/2022 1313   MONOABS 0.5 03/12/2017 1116   EOSABS 0.1 10/24/2022 1313   EOSABS 0.1 03/12/2017 1116   BASOSABS 0.1 10/24/2022 1313   BASOSABS 0.0 03/12/2017 1116    CMP     Component Value Date/Time   NA 139 10/24/2022 1313   NA 142 03/31/2019 1320   NA 142 03/12/2017 1116   K 4.2 10/24/2022 1313   K 4.2 03/12/2017 1116   CL 101 10/24/2022 1313   CL 100 03/18/2012 1422   CO2 32 10/24/2022 1313   CO2 25 03/12/2017 1116   GLUCOSE 101 (H) 10/24/2022 1313   GLUCOSE 87 03/12/2017 1116   GLUCOSE 92 03/18/2012 1422   BUN 25 (H) 10/24/2022 1313   BUN 15 03/31/2019 1320   BUN 18.8 03/12/2017 1116   CREATININE 1.03 (H) 10/24/2022 1313   CREATININE 0.8 03/12/2017 1116   CALCIUM 9.3 10/24/2022 1313   CALCIUM 9.7 03/12/2017 1116   PROT 6.6 10/24/2022 1313   PROT 6.5 03/12/2017 1116   ALBUMIN 4.1 10/24/2022 1313   ALBUMIN 3.6 03/12/2017 1116   AST 22 10/24/2022 1313   AST 27 03/12/2017 1116   ALT 24 10/24/2022 1313   ALT 32 03/12/2017 1116   ALKPHOS 76 10/24/2022 1313   ALKPHOS 71 03/12/2017 1116   BILITOT 0.7 10/24/2022 1313   BILITOT 0.64 03/12/2017 1116   GFRNONAA 57 (  L) 10/24/2022 1313   GFRAA >60 10/26/2019 1145       ASSESSMENT and THERAPY PLAN:   History of breast cancer Eileen [name] is a 77 year old woman with history of breast cancer diagnosed in the late 1990s s/p  chemotherapy, radiation, and stem cell transplant.    She has no clinical or radiographic sign of breast cancer recurrence.  She will continue on observation alone.  I counseled her on annual mammograms and bone density testing. She requested we add on a CA 27 29 to today's labs which I did. She understands that this test is not always accurate and that if she has any signs of breast cancer recurrence she should still be evaluated promptly.   I recommended continued healthy diet and exercise.    She would like to return in 1 year for continued f/u and surveillance.   History of colon cancer She has h/o stage IIA colon cancer diagnosed in 2006 s/p right colectomy, adjuvant chemotherapy.    She has no clinical signs of recurrence. I added on CEA today.  She was recommended to continue to f/u with GI for colonoscopies, which she is undergoing every 5 years.    She will return in 1 year for continued long term surveillance.  I recommended healthy diet and exercise today.      All questions were answered. The patient knows to call the clinic with any problems, questions or concerns. We can certainly see the patient much sooner if necessary.  Total encounter time:30 minutes*in face-to-face visit time, chart review, lab review, care coordination, order entry, and documentation of the encounter time.    Wilber Bihari, NP 10/27/22 4:42 PM Medical Oncology and Hematology [facility] [place], [place] [address] Tel. [phone]    Fax. [phone]  *Total Encounter Time as defined by the Centers for Medicare and Medicaid Services includes, in addition to the face-to-face time of a patient visit (documented in the note above) non-face-to-face time: obtaining and reviewing outside history, ordering and reviewing medications, tests or procedures, care coordination (communications with other health care professionals or caregivers) and documentation in the medical  record.

## 2022-10-25 ENCOUNTER — Telehealth: Payer: Self-pay | Admitting: Adult Health

## 2022-10-25 LAB — CANCER ANTIGEN 27.29: CA 27.29: 23.2 U/mL (ref 0.0–38.6)

## 2022-10-25 NOTE — Telephone Encounter (Signed)
Scheduled appointments per 3/28 los. Left voicemail.

## 2022-10-27 ENCOUNTER — Encounter: Payer: Self-pay | Admitting: Adult Health

## 2022-10-27 NOTE — Assessment & Plan Note (Signed)
Eileen [name] is a 77 year old woman with history of breast cancer diagnosed in the late 1990s s/p chemotherapy, radiation, and stem cell transplant.    She has no clinical or radiographic sign of breast cancer recurrence.  She will continue on observation alone.  I counseled her on annual mammograms and bone density testing. She requested we add on a CA 27 29 to today's labs which I did. She understands that this test is not always accurate and that if she has any signs of breast cancer recurrence she should still be evaluated promptly.   I recommended continued healthy diet and exercise.    She would like to return in 1 year for continued f/u and surveillance.

## 2022-10-27 NOTE — Assessment & Plan Note (Signed)
She has h/o stage IIA colon cancer diagnosed in 2006 s/p right colectomy, adjuvant chemotherapy.    She has no clinical signs of recurrence. I added on CEA today.  She was recommended to continue to f/u with GI for colonoscopies, which she is undergoing every 5 years.    She will return in 1 year for continued long term surveillance.  I recommended healthy diet and exercise today.

## 2022-10-28 ENCOUNTER — Telehealth: Payer: Self-pay

## 2022-10-28 NOTE — Telephone Encounter (Signed)
Called and given below message. She verbalized understanding and appreciated the call. Verified address and will mail printed copy of lab results per her request.

## 2022-10-28 NOTE — Telephone Encounter (Signed)
-----   Message from Gardenia Phlegm, NP sent at 10/27/2022  4:21 PM EDT ----- Please call patient to give her results and please also mail to her.   Thanks, LC ----- Message ----- From: Buel Ream, Lab In [place] Sent: 10/24/2022   5:56 PM EDT To: Gardenia Phlegm, NP

## 2022-10-30 DIAGNOSIS — R351 Nocturia: Secondary | ICD-10-CM | POA: Diagnosis not present

## 2022-10-30 DIAGNOSIS — N390 Urinary tract infection, site not specified: Secondary | ICD-10-CM | POA: Diagnosis not present

## 2022-11-06 DIAGNOSIS — I1A0 Resistant hypertension: Secondary | ICD-10-CM | POA: Diagnosis not present

## 2022-11-06 DIAGNOSIS — L578 Other skin changes due to chronic exposure to nonionizing radiation: Secondary | ICD-10-CM | POA: Diagnosis not present

## 2022-11-06 DIAGNOSIS — T733XXS Exhaustion due to excessive exertion, sequela: Secondary | ICD-10-CM | POA: Diagnosis not present

## 2022-11-06 DIAGNOSIS — L821 Other seborrheic keratosis: Secondary | ICD-10-CM | POA: Diagnosis not present

## 2022-11-06 DIAGNOSIS — I10 Essential (primary) hypertension: Secondary | ICD-10-CM | POA: Diagnosis not present

## 2022-11-08 DIAGNOSIS — H25813 Combined forms of age-related cataract, bilateral: Secondary | ICD-10-CM | POA: Diagnosis not present

## 2022-11-18 DIAGNOSIS — H25811 Combined forms of age-related cataract, right eye: Secondary | ICD-10-CM | POA: Diagnosis not present

## 2022-11-18 DIAGNOSIS — I10 Essential (primary) hypertension: Secondary | ICD-10-CM | POA: Diagnosis not present

## 2022-11-18 DIAGNOSIS — Z9841 Cataract extraction status, right eye: Secondary | ICD-10-CM | POA: Insufficient documentation

## 2022-11-19 DIAGNOSIS — Z9841 Cataract extraction status, right eye: Secondary | ICD-10-CM | POA: Diagnosis not present

## 2022-11-19 DIAGNOSIS — E785 Hyperlipidemia, unspecified: Secondary | ICD-10-CM | POA: Diagnosis not present

## 2022-11-19 DIAGNOSIS — I10 Essential (primary) hypertension: Secondary | ICD-10-CM | POA: Diagnosis not present

## 2022-11-19 DIAGNOSIS — H04123 Dry eye syndrome of bilateral lacrimal glands: Secondary | ICD-10-CM | POA: Diagnosis not present

## 2022-11-19 DIAGNOSIS — Z79899 Other long term (current) drug therapy: Secondary | ICD-10-CM | POA: Diagnosis not present

## 2022-11-19 DIAGNOSIS — H269 Unspecified cataract: Secondary | ICD-10-CM | POA: Diagnosis not present

## 2022-11-19 DIAGNOSIS — H353132 Nonexudative age-related macular degeneration, bilateral, intermediate dry stage: Secondary | ICD-10-CM | POA: Diagnosis not present

## 2022-11-19 DIAGNOSIS — Z48810 Encounter for surgical aftercare following surgery on the sense organs: Secondary | ICD-10-CM | POA: Diagnosis not present

## 2022-11-26 DIAGNOSIS — H04123 Dry eye syndrome of bilateral lacrimal glands: Secondary | ICD-10-CM | POA: Diagnosis not present

## 2022-11-26 DIAGNOSIS — H353132 Nonexudative age-related macular degeneration, bilateral, intermediate dry stage: Secondary | ICD-10-CM | POA: Diagnosis not present

## 2022-11-26 DIAGNOSIS — H268 Other specified cataract: Secondary | ICD-10-CM | POA: Diagnosis not present

## 2022-11-26 DIAGNOSIS — Z9841 Cataract extraction status, right eye: Secondary | ICD-10-CM | POA: Diagnosis not present

## 2022-12-04 DIAGNOSIS — R350 Frequency of micturition: Secondary | ICD-10-CM | POA: Diagnosis not present

## 2022-12-04 DIAGNOSIS — R351 Nocturia: Secondary | ICD-10-CM | POA: Diagnosis not present

## 2022-12-12 DIAGNOSIS — I10 Essential (primary) hypertension: Secondary | ICD-10-CM | POA: Diagnosis not present

## 2022-12-12 DIAGNOSIS — I4819 Other persistent atrial fibrillation: Secondary | ICD-10-CM | POA: Diagnosis not present

## 2022-12-24 DIAGNOSIS — Z9841 Cataract extraction status, right eye: Secondary | ICD-10-CM | POA: Diagnosis not present

## 2022-12-24 DIAGNOSIS — H25812 Combined forms of age-related cataract, left eye: Secondary | ICD-10-CM | POA: Diagnosis not present

## 2023-01-01 DIAGNOSIS — F419 Anxiety disorder, unspecified: Secondary | ICD-10-CM | POA: Diagnosis not present

## 2023-01-01 DIAGNOSIS — G9332 Myalgic encephalomyelitis/chronic fatigue syndrome: Secondary | ICD-10-CM | POA: Diagnosis not present

## 2023-01-01 DIAGNOSIS — I4819 Other persistent atrial fibrillation: Secondary | ICD-10-CM | POA: Diagnosis not present

## 2023-01-01 DIAGNOSIS — I872 Venous insufficiency (chronic) (peripheral): Secondary | ICD-10-CM | POA: Insufficient documentation

## 2023-01-01 DIAGNOSIS — R0989 Other specified symptoms and signs involving the circulatory and respiratory systems: Secondary | ICD-10-CM | POA: Insufficient documentation

## 2023-01-01 DIAGNOSIS — G9089 Other disorders of autonomic nervous system: Secondary | ICD-10-CM | POA: Insufficient documentation

## 2023-01-01 DIAGNOSIS — G908 Other disorders of autonomic nervous system: Secondary | ICD-10-CM | POA: Diagnosis not present

## 2023-01-01 DIAGNOSIS — I10 Essential (primary) hypertension: Secondary | ICD-10-CM | POA: Diagnosis not present

## 2023-01-13 DIAGNOSIS — H25812 Combined forms of age-related cataract, left eye: Secondary | ICD-10-CM | POA: Diagnosis not present

## 2023-01-13 DIAGNOSIS — I10 Essential (primary) hypertension: Secondary | ICD-10-CM | POA: Diagnosis not present

## 2023-01-14 ENCOUNTER — Other Ambulatory Visit: Payer: Self-pay | Admitting: Family Medicine

## 2023-01-14 DIAGNOSIS — Z9842 Cataract extraction status, left eye: Secondary | ICD-10-CM | POA: Insufficient documentation

## 2023-01-14 DIAGNOSIS — H04123 Dry eye syndrome of bilateral lacrimal glands: Secondary | ICD-10-CM | POA: Diagnosis not present

## 2023-01-14 DIAGNOSIS — H353132 Nonexudative age-related macular degeneration, bilateral, intermediate dry stage: Secondary | ICD-10-CM | POA: Diagnosis not present

## 2023-01-14 DIAGNOSIS — Z1231 Encounter for screening mammogram for malignant neoplasm of breast: Secondary | ICD-10-CM

## 2023-01-14 DIAGNOSIS — Z48810 Encounter for surgical aftercare following surgery on the sense organs: Secondary | ICD-10-CM | POA: Diagnosis not present

## 2023-01-14 DIAGNOSIS — Z961 Presence of intraocular lens: Secondary | ICD-10-CM | POA: Diagnosis not present

## 2023-01-14 DIAGNOSIS — Z9841 Cataract extraction status, right eye: Secondary | ICD-10-CM | POA: Diagnosis not present

## 2023-01-17 ENCOUNTER — Encounter: Payer: Self-pay | Admitting: Adult Health

## 2023-01-31 ENCOUNTER — Ambulatory Visit: Payer: Medicare Other

## 2023-02-06 ENCOUNTER — Ambulatory Visit: Payer: Medicare Other

## 2023-02-11 DIAGNOSIS — Z9842 Cataract extraction status, left eye: Secondary | ICD-10-CM | POA: Diagnosis not present

## 2023-02-11 DIAGNOSIS — Z9841 Cataract extraction status, right eye: Secondary | ICD-10-CM | POA: Diagnosis not present

## 2023-02-11 DIAGNOSIS — H3530 Unspecified macular degeneration: Secondary | ICD-10-CM | POA: Diagnosis not present

## 2023-02-11 DIAGNOSIS — H353132 Nonexudative age-related macular degeneration, bilateral, intermediate dry stage: Secondary | ICD-10-CM | POA: Diagnosis not present

## 2023-02-20 DIAGNOSIS — I428 Other cardiomyopathies: Secondary | ICD-10-CM | POA: Diagnosis not present

## 2023-02-20 DIAGNOSIS — I10 Essential (primary) hypertension: Secondary | ICD-10-CM | POA: Diagnosis not present

## 2023-02-20 DIAGNOSIS — I4819 Other persistent atrial fibrillation: Secondary | ICD-10-CM | POA: Diagnosis not present

## 2023-02-28 DIAGNOSIS — H3530 Unspecified macular degeneration: Secondary | ICD-10-CM | POA: Diagnosis not present

## 2023-02-28 DIAGNOSIS — Z9842 Cataract extraction status, left eye: Secondary | ICD-10-CM | POA: Diagnosis not present

## 2023-02-28 DIAGNOSIS — H353132 Nonexudative age-related macular degeneration, bilateral, intermediate dry stage: Secondary | ICD-10-CM | POA: Diagnosis not present

## 2023-02-28 DIAGNOSIS — Z9841 Cataract extraction status, right eye: Secondary | ICD-10-CM | POA: Diagnosis not present

## 2023-03-18 DIAGNOSIS — Z8673 Personal history of transient ischemic attack (TIA), and cerebral infarction without residual deficits: Secondary | ICD-10-CM | POA: Diagnosis not present

## 2023-03-18 DIAGNOSIS — I480 Paroxysmal atrial fibrillation: Secondary | ICD-10-CM | POA: Diagnosis not present

## 2023-03-18 DIAGNOSIS — I10 Essential (primary) hypertension: Secondary | ICD-10-CM | POA: Diagnosis not present

## 2023-03-20 DIAGNOSIS — R739 Hyperglycemia, unspecified: Secondary | ICD-10-CM | POA: Diagnosis not present

## 2023-03-20 DIAGNOSIS — Z0000 Encounter for general adult medical examination without abnormal findings: Secondary | ICD-10-CM | POA: Diagnosis not present

## 2023-03-20 DIAGNOSIS — I679 Cerebrovascular disease, unspecified: Secondary | ICD-10-CM | POA: Diagnosis not present

## 2023-03-20 DIAGNOSIS — F5101 Primary insomnia: Secondary | ICD-10-CM | POA: Diagnosis not present

## 2023-03-20 DIAGNOSIS — E559 Vitamin D deficiency, unspecified: Secondary | ICD-10-CM | POA: Diagnosis not present

## 2023-03-20 DIAGNOSIS — I480 Paroxysmal atrial fibrillation: Secondary | ICD-10-CM | POA: Diagnosis not present

## 2023-03-20 DIAGNOSIS — Z1211 Encounter for screening for malignant neoplasm of colon: Secondary | ICD-10-CM | POA: Diagnosis not present

## 2023-03-20 DIAGNOSIS — E039 Hypothyroidism, unspecified: Secondary | ICD-10-CM | POA: Diagnosis not present

## 2023-03-20 DIAGNOSIS — I428 Other cardiomyopathies: Secondary | ICD-10-CM | POA: Diagnosis not present

## 2023-03-20 DIAGNOSIS — I10 Essential (primary) hypertension: Secondary | ICD-10-CM | POA: Diagnosis not present

## 2023-03-20 DIAGNOSIS — Z85038 Personal history of other malignant neoplasm of large intestine: Secondary | ICD-10-CM | POA: Diagnosis not present

## 2023-03-20 DIAGNOSIS — E7800 Pure hypercholesterolemia, unspecified: Secondary | ICD-10-CM | POA: Diagnosis not present

## 2023-03-23 DIAGNOSIS — Z85038 Personal history of other malignant neoplasm of large intestine: Secondary | ICD-10-CM | POA: Diagnosis not present

## 2023-04-01 DIAGNOSIS — Z7901 Long term (current) use of anticoagulants: Secondary | ICD-10-CM | POA: Diagnosis not present

## 2023-04-01 DIAGNOSIS — R9431 Abnormal electrocardiogram [ECG] [EKG]: Secondary | ICD-10-CM | POA: Diagnosis not present

## 2023-04-01 DIAGNOSIS — H539 Unspecified visual disturbance: Secondary | ICD-10-CM | POA: Diagnosis not present

## 2023-04-01 DIAGNOSIS — I440 Atrioventricular block, first degree: Secondary | ICD-10-CM | POA: Diagnosis not present

## 2023-04-01 DIAGNOSIS — Z87891 Personal history of nicotine dependence: Secondary | ICD-10-CM | POA: Diagnosis not present

## 2023-04-01 DIAGNOSIS — Z91048 Other nonmedicinal substance allergy status: Secondary | ICD-10-CM | POA: Diagnosis not present

## 2023-04-01 DIAGNOSIS — I69398 Other sequelae of cerebral infarction: Secondary | ICD-10-CM | POA: Diagnosis not present

## 2023-04-01 DIAGNOSIS — G319 Degenerative disease of nervous system, unspecified: Secondary | ICD-10-CM | POA: Diagnosis not present

## 2023-04-01 DIAGNOSIS — R519 Headache, unspecified: Secondary | ICD-10-CM | POA: Diagnosis not present

## 2023-04-01 DIAGNOSIS — Z885 Allergy status to narcotic agent status: Secondary | ICD-10-CM | POA: Diagnosis not present

## 2023-04-01 DIAGNOSIS — R2689 Other abnormalities of gait and mobility: Secondary | ICD-10-CM | POA: Diagnosis not present

## 2023-04-01 DIAGNOSIS — I10 Essential (primary) hypertension: Secondary | ICD-10-CM | POA: Diagnosis not present

## 2023-04-01 DIAGNOSIS — R9082 White matter disease, unspecified: Secondary | ICD-10-CM | POA: Diagnosis not present

## 2023-04-01 DIAGNOSIS — R001 Bradycardia, unspecified: Secondary | ICD-10-CM | POA: Diagnosis not present

## 2023-04-07 DIAGNOSIS — M479 Spondylosis, unspecified: Secondary | ICD-10-CM | POA: Diagnosis not present

## 2023-04-07 DIAGNOSIS — Z978 Presence of other specified devices: Secondary | ICD-10-CM | POA: Diagnosis not present

## 2023-04-07 DIAGNOSIS — Z95818 Presence of other cardiac implants and grafts: Secondary | ICD-10-CM | POA: Diagnosis not present

## 2023-04-07 DIAGNOSIS — M419 Scoliosis, unspecified: Secondary | ICD-10-CM | POA: Diagnosis not present

## 2023-04-07 DIAGNOSIS — Z79899 Other long term (current) drug therapy: Secondary | ICD-10-CM | POA: Diagnosis not present

## 2023-04-07 DIAGNOSIS — M25511 Pain in right shoulder: Secondary | ICD-10-CM | POA: Diagnosis not present

## 2023-04-07 DIAGNOSIS — I517 Cardiomegaly: Secondary | ICD-10-CM | POA: Diagnosis not present

## 2023-04-09 DIAGNOSIS — I872 Venous insufficiency (chronic) (peripheral): Secondary | ICD-10-CM | POA: Diagnosis not present

## 2023-04-09 DIAGNOSIS — I4891 Unspecified atrial fibrillation: Secondary | ICD-10-CM | POA: Diagnosis not present

## 2023-04-09 DIAGNOSIS — Z8673 Personal history of transient ischemic attack (TIA), and cerebral infarction without residual deficits: Secondary | ICD-10-CM | POA: Diagnosis not present

## 2023-04-09 DIAGNOSIS — Z4509 Encounter for adjustment and management of other cardiac device: Secondary | ICD-10-CM | POA: Diagnosis not present

## 2023-04-09 DIAGNOSIS — I82891 Chronic embolism and thrombosis of other specified veins: Secondary | ICD-10-CM | POA: Diagnosis not present

## 2023-04-09 DIAGNOSIS — G908 Other disorders of autonomic nervous system: Secondary | ICD-10-CM | POA: Diagnosis not present

## 2023-04-10 ENCOUNTER — Ambulatory Visit
Admission: RE | Admit: 2023-04-10 | Discharge: 2023-04-10 | Disposition: A | Discharge status: Home / Self Care | Payer: Medicare Other | Source: Ambulatory Visit | Attending: Family Medicine | Admitting: Family Medicine

## 2023-04-10 DIAGNOSIS — Z1231 Encounter for screening mammogram for malignant neoplasm of breast: Secondary | ICD-10-CM

## 2023-04-15 DIAGNOSIS — H04123 Dry eye syndrome of bilateral lacrimal glands: Secondary | ICD-10-CM | POA: Diagnosis not present

## 2023-04-15 DIAGNOSIS — Z9842 Cataract extraction status, left eye: Secondary | ICD-10-CM | POA: Diagnosis not present

## 2023-04-15 DIAGNOSIS — H353132 Nonexudative age-related macular degeneration, bilateral, intermediate dry stage: Secondary | ICD-10-CM | POA: Diagnosis not present

## 2023-04-15 DIAGNOSIS — Z9841 Cataract extraction status, right eye: Secondary | ICD-10-CM | POA: Diagnosis not present

## 2023-04-24 DIAGNOSIS — H5462 Unqualified visual loss, left eye, normal vision right eye: Secondary | ICD-10-CM | POA: Diagnosis not present

## 2023-04-24 DIAGNOSIS — H3530 Unspecified macular degeneration: Secondary | ICD-10-CM | POA: Diagnosis not present

## 2023-04-24 DIAGNOSIS — H469 Unspecified optic neuritis: Secondary | ICD-10-CM | POA: Diagnosis not present

## 2023-04-24 DIAGNOSIS — Z8673 Personal history of transient ischemic attack (TIA), and cerebral infarction without residual deficits: Secondary | ICD-10-CM | POA: Diagnosis not present

## 2023-04-24 DIAGNOSIS — I63531 Cerebral infarction due to unspecified occlusion or stenosis of right posterior cerebral artery: Secondary | ICD-10-CM | POA: Diagnosis not present

## 2023-04-29 DIAGNOSIS — Z23 Encounter for immunization: Secondary | ICD-10-CM | POA: Diagnosis not present

## 2023-04-30 DIAGNOSIS — L309 Dermatitis, unspecified: Secondary | ICD-10-CM | POA: Diagnosis not present

## 2023-05-06 DIAGNOSIS — H5213 Myopia, bilateral: Secondary | ICD-10-CM | POA: Insufficient documentation

## 2023-05-14 DIAGNOSIS — I4892 Unspecified atrial flutter: Secondary | ICD-10-CM | POA: Diagnosis not present

## 2023-05-14 DIAGNOSIS — I4891 Unspecified atrial fibrillation: Secondary | ICD-10-CM | POA: Diagnosis not present

## 2023-05-14 DIAGNOSIS — Z95818 Presence of other cardiac implants and grafts: Secondary | ICD-10-CM | POA: Diagnosis not present

## 2023-06-18 DIAGNOSIS — Z95818 Presence of other cardiac implants and grafts: Secondary | ICD-10-CM | POA: Diagnosis not present

## 2023-06-18 DIAGNOSIS — I4891 Unspecified atrial fibrillation: Secondary | ICD-10-CM | POA: Diagnosis not present

## 2023-06-18 DIAGNOSIS — I4892 Unspecified atrial flutter: Secondary | ICD-10-CM | POA: Diagnosis not present

## 2023-06-23 DIAGNOSIS — I10 Essential (primary) hypertension: Secondary | ICD-10-CM | POA: Diagnosis not present

## 2023-06-23 DIAGNOSIS — I480 Paroxysmal atrial fibrillation: Secondary | ICD-10-CM | POA: Diagnosis not present

## 2023-06-24 DIAGNOSIS — Z9842 Cataract extraction status, left eye: Secondary | ICD-10-CM | POA: Diagnosis not present

## 2023-06-24 DIAGNOSIS — H353132 Nonexudative age-related macular degeneration, bilateral, intermediate dry stage: Secondary | ICD-10-CM | POA: Diagnosis not present

## 2023-06-24 DIAGNOSIS — Z9841 Cataract extraction status, right eye: Secondary | ICD-10-CM | POA: Diagnosis not present

## 2023-07-10 ENCOUNTER — Telehealth: Payer: Self-pay | Admitting: Adult Health

## 2023-07-14 DIAGNOSIS — R52 Pain, unspecified: Secondary | ICD-10-CM | POA: Diagnosis not present

## 2023-07-14 DIAGNOSIS — L600 Ingrowing nail: Secondary | ICD-10-CM | POA: Diagnosis not present

## 2023-07-23 DIAGNOSIS — I4891 Unspecified atrial fibrillation: Secondary | ICD-10-CM | POA: Diagnosis not present

## 2023-07-23 DIAGNOSIS — Z95818 Presence of other cardiac implants and grafts: Secondary | ICD-10-CM | POA: Diagnosis not present

## 2023-07-23 DIAGNOSIS — I4892 Unspecified atrial flutter: Secondary | ICD-10-CM | POA: Diagnosis not present

## 2023-07-29 DIAGNOSIS — I428 Other cardiomyopathies: Secondary | ICD-10-CM | POA: Diagnosis not present

## 2023-07-29 DIAGNOSIS — I4891 Unspecified atrial fibrillation: Secondary | ICD-10-CM | POA: Diagnosis not present

## 2023-07-29 DIAGNOSIS — I10 Essential (primary) hypertension: Secondary | ICD-10-CM | POA: Diagnosis not present

## 2023-07-29 DIAGNOSIS — I480 Paroxysmal atrial fibrillation: Secondary | ICD-10-CM | POA: Diagnosis not present

## 2023-07-29 DIAGNOSIS — I4819 Other persistent atrial fibrillation: Secondary | ICD-10-CM | POA: Diagnosis not present

## 2023-10-09 ENCOUNTER — Ambulatory Visit (INDEPENDENT_AMBULATORY_CARE_PROVIDER_SITE_OTHER): Payer: Medicare Other | Admitting: Gastroenterology

## 2023-10-09 ENCOUNTER — Encounter: Payer: Self-pay | Admitting: Gastroenterology

## 2023-10-09 VITALS — BP 120/64 | HR 52 | Ht 64.0 in | Wt 138.0 lb

## 2023-10-09 DIAGNOSIS — Z8673 Personal history of transient ischemic attack (TIA), and cerebral infarction without residual deficits: Secondary | ICD-10-CM

## 2023-10-09 DIAGNOSIS — K5909 Other constipation: Secondary | ICD-10-CM

## 2023-10-09 DIAGNOSIS — I639 Cerebral infarction, unspecified: Secondary | ICD-10-CM

## 2023-10-09 DIAGNOSIS — Z860100 Personal history of colon polyps, unspecified: Secondary | ICD-10-CM

## 2023-10-09 DIAGNOSIS — I4891 Unspecified atrial fibrillation: Secondary | ICD-10-CM | POA: Diagnosis not present

## 2023-10-09 DIAGNOSIS — Z85038 Personal history of other malignant neoplasm of large intestine: Secondary | ICD-10-CM | POA: Diagnosis not present

## 2023-10-09 MED ORDER — SUFLAVE 178.7 G PO SOLR: 1.0000 | Freq: Once | ORAL | 0 refills | Status: AC

## 2023-10-09 NOTE — Patient Instructions (Addendum)
 You have been scheduled for a colonoscopy. Please follow written instructions given to you at your visit today.   If you use inhalers (even only as needed), please bring them with you on the day of your procedure.  DO NOT TAKE 7 DAYS PRIOR TO TEST- Trulicity (dulaglutide) Ozempic, Wegovy (semaglutide) Mounjaro (tirzepatide) Bydureon Bcise (exanatide extended release)  DO NOT TAKE 1 DAY PRIOR TO YOUR TEST Rybelsus (semaglutide) Adlyxin (lixisenatide) Victoza (liraglutide) Byetta (exanatide) ___________________________________________________________________________  Bonita Quin will receive your bowel preparation through [facility], which ensures the lowest copay and home delivery, with outreach via text or call from an [phone] number. Please respond promptly to avoid rescheduling of your procedure. If you are interested in alternative options or have any questions regarding your prep, please contact them at [phone] ____________________________________________________________________________  Your Provider Has Sent Your Bowel Prep Regimen To [facility]   [facility] will contact you to verify your information and collect your copay, if applicable. Enjoy the comfort of your home while your prescription is mailed to you, FREE of any shipping charges.   [facility] accepts all major insurance benefits and applies discounts & coupons.  Have additional questions?   Chat: [email or web address] Call: [phone] Email: [email or web address] NCPDP: [id number]  How will [facility] contact you?  With a Welcome phone call,  a Welcome text and a checkout link in text form.  Texts you receive from [phone] Are NOT Spam.  *To set up delivery, you must complete the checkout process via link or speak to one of the patient care representatives. If [facility] is unable to reach you, your prescription may be delayed.  To avoid long hold times on the phone, you may also utilize the secure chat  feature on the [facility] website to request that they call you back for transaction completion or to expedite your concerns.  Due to recent changes in healthcare laws, you may see the results of your imaging and laboratory studies on MyChart before your provider has had a chance to review them.  We understand that in some cases there may be results that are confusing or concerning to you. Not all laboratory results come back in the same time frame and the provider may be waiting for multiple results in order to interpret others.  Please give [place] 48 hours in order for your provider to thoroughly review all the results before contacting the office for clarification of your results.   Thank you for trusting me with your gastrointestinal care!   Boone Master, PA

## 2023-10-09 NOTE — Progress Notes (Signed)
 Chief Complaint: Repeat colonoscopy Primary GI MD: Dr. Lavon Paganini  HPI: 78 year old female history of TIA, CHF, cardiomyopathy, A-fib s/p loop recorder (on Eliquis), breast cancer, history of adenocarcinoma of the colon stage IIa diagnosed 2015, status postresection, presents for repeat colonoscopy  History of ablation 01/2021. 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  Echocardiogram 09/2022 with EF 55 to 60% Last seen by cardiology 06/2023 and was doing well at that time  Last seen by Mike Gip, PA-C 07/2022.  At that time she had recently had CVA September 2023 and was started on Eliquis and aspirin.  Given her recent CVA and then possible TIA January 2024 colonoscopy was deferred in order to continue Eliquis uninterrupted.  ----------TODAY-------------  Patient presents today to discuss repeat colonoscopy.  At her last office visit she was struggling with constipation and was recommended to take MiraLAX.  She states that MiraLAX did not provide adequate relief so she tried stool softeners.  She states she continues to struggle with constipation despite MiraLAX and stool softeners.  Denies weight loss, rectal bleeding, abdominal pain.  Last colonoscopy in January 2021 with a 5-year recall.  PREVIOUS GI WORKUP   last colonoscopy January 2021 negative other than a 1 mm polyp removed from the descending colon which by path was benign colonic mucosa. She is currently indicated for 5-year interval follow-up  Past Medical History:  Diagnosis Date   Anemia    Axillary mass 01/1998   Left   Barrett's esophagus    Blood transfusion without reported diagnosis    CARCINOMA, BREAST 1995   left   Cervical spinal stenosis    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   Headache(784.0)    HEMORRHOIDS    Hypertension    Malignant neoplasm of colon (HCC) 01/26/2005   Qualifier: History of   By: Joselyn Glassman       Personal history of  chemotherapy    Personal history of radiation therapy     Past Surgical History:  Procedure Laterality Date   BIOPSY  08/10/2019   Procedure: BIOPSY;  Surgeon: Napoleon Form, MD;  Location: [facility];  Service: Endoscopy;;   BONE MARROW TRANSPLANT  1999   BREAST LUMPECTOMY Left 11/95   left breast   BUNIONECTOMY  3/09, 10/09   right and left   COLON SURGERY     COLONOSCOPY     COLONOSCOPY WITH PROPOFOL [name] 08/10/2019   Procedure: COLONOSCOPY WITH PROPOFOL;  Surgeon: Napoleon Form, MD;  [facility];  Service: Endoscopy;  Laterality: [name];   ERCP  01/21/2012   Procedure: ENDOSCOPIC RETROGRADE CHOLANGIOPANCREATOGRAPHY (ERCP);  Surgeon: Hart Carwin, MD;  Location: [facility];  Service: Endoscopy;  Laterality: [name];   hemicolectomy  01/2005   right   HYSTEROSCOPY  08/30/94   INGUINAL HERNIA REPAIR     LEFT HEART CATH AND CORONARY ANGIOGRAPHY [name] 05/21/2018   Procedure: LEFT HEART CATH AND CORONARY ANGIOGRAPHY;  Surgeon: Yvonne Kendall, MD;  [facility];  Service: Cardiovascular;  Laterality: [name];   NECK MASS EXCISION Left 7/99   ROTATOR CUFF REPAIR Right 09/08/13   full tear   TOTAL ABDOMINAL HYSTERECTOMY W/ BILATERAL SALPINGOOPHORECTOMY  1996   TUNNELED VENOUS CATHETER PLACEMENT  9/98    Current Outpatient Medications  Medication Sig Dispense Refill   acetaminophen (TYLENOL) 500 MG tablet Take 500 mg by mouth every 6 (six) hours as needed for moderate pain or headache.  amiodarone (PACERONE) 200 MG tablet Take 200 mg by mouth daily. (Patient not taking: Reported on 08/09/2022)     apixaban (ELIQUIS) 5 MG TABS tablet Take 5 mg by mouth 2 (two) times daily.     atorvastatin (LIPITOR) 80 MG tablet Take 80 mg by mouth daily.     b complex vitamins tablet Take 1 tablet by mouth 2 (two) times a week.      carvedilol (COREG) 25 MG tablet Take 1 tablet (25 mg total) by mouth daily.     chlorthalidone (HYGROTON) 25 MG tablet Take 25 mg by mouth  daily. (Patient not taking: Reported on 08/09/2022)     losartan (COZAAR) 50 MG tablet Take 50 mg by mouth daily.     magnesium oxide (MAG-OX) 400 MG tablet Take by mouth.     Multiple Minerals (CALCIUM-MAGNESIUM-ZINC) TABS Take 1 tablet by mouth 2 (two) times a week.     POTASSIUM PO Take by mouth.     vitamin B-12 (CYANOCOBALAMIN) 500 MCG tablet Take 500 mcg by mouth See admin instructions. Take 500 mcg 5 days weekly, takes on non b complex days     vitamin C (ASCORBIC ACID) 500 MG tablet Take 500 mg by mouth 2 (two) times daily.      Vitamin D, Ergocalciferol, (DRISDOL) 50000 UNITS CAPS Take 50,000 Units by mouth every Wednesday.      vitamin E 400 UNIT capsule Take 400 Units by mouth daily.     No current facility-administered medications for this visit.    Allergies as of 10/09/2023 - Review Complete 10/24/2022  Allergen Reaction Noted   Sacubitril-valsartan Anaphylaxis, Diarrhea, and Nausea Only 01/01/2021   Carvedilol Other (See Comments) 05/05/2020   Levofloxacin Other (See Comments) 11/05/2011   Methocarbamol Rash 05/05/2020   Tape  08/27/2013   Tramadol Other (See Comments) 03/25/2022    Family History  Problem Relation Age of Onset   Uterine cancer Mother    Heart attack Father    Colon cancer Neg Hx    Esophageal cancer Neg Hx    Stomach cancer Neg Hx    Rectal cancer Neg Hx     Social History   Socioeconomic History   Marital status: Married    Spouse name: Not on file   Number of children: 2   Years of education: Not on file   Highest education level: Not on file  Occupational History   Not on file  Tobacco Use   Smoking status: Former    Current packs/day: 0.00    Types: Cigarettes    Quit date: 12/20/1990    Years since quitting: 32.8   Smokeless tobacco: Never  Vaping Use   Vaping status: Never Used  Substance and Sexual Activity   Alcohol use: Not Currently   Drug use: No   Sexual activity: Not Currently    Birth control/protection: Surgical     Comment: hysterectomy  Other Topics Concern   Not on file  Social History Narrative   ** Merged History Encounter **       Social Drivers of Health   Financial Resource Strain: Low Risk  (08/15/2022)   Received from [employer], [facility]   Overall Financial Resource Strain (CARDIA)    Difficulty of Paying Living Expenses: Not hard at all  Food Insecurity: No Food Insecurity (08/15/2022)   Received from [facility], [facility]   Hunger Vital Sign    Worried About Running Out of Food in the Last Year: Never true  Ran Out of Food in the Last Year: Never true  Transportation Needs: No Transportation Needs (08/15/2022)   Received from [facility], [facility]   PRAPARE - Transportation    Lack of Transportation (Medical): No    Lack of Transportation (Non-Medical): No  Physical Activity: Insufficiently Active (10/10/2022)   Received from [facility], [facility]   Exercise Vital Sign    Days of Exercise per Week: 3 days    Minutes of Exercise per Session: 20 min  Stress: No Stress Concern Present (08/04/2022)   Received from [facility], [facility] of Occupational Health - Occupational Stress Questionnaire    Feeling of Stress : Not at all  Social Connections: Unknown (12/07/2021)   Received from [facility], [facility]   Social Network    Social Network: Not on file  Intimate Partner Violence: Not At Risk (04/01/2023)   Received from [facility]   HITS    Over the last 12 months how often did your partner physically hurt you?: Never    Over the last 12 months how often did your partner insult you or talk down to you?: Never    Over the last 12 months how often did your partner threaten you with physical harm?: Never    Over the last 12 months how often did your partner scream or curse at you?: Never    Review of Systems:    Constitutional: No weight loss, fever, chills, weakness or fatigue HEENT: Eyes: No change in vision                Ears, Nose, Throat:  No change in hearing or congestion Skin: No rash or itching Cardiovascular: No chest pain, chest pressure or palpitations   Respiratory: No SOB or cough Gastrointestinal: See HPI and otherwise negative Genitourinary: No dysuria or change in urinary frequency Neurological: No headache, dizziness or syncope Musculoskeletal: No new muscle or joint pain Hematologic: No bleeding or bruising Psychiatric: No history of depression or anxiety    Physical Exam:  Vital signs: LMP 07/30/1995   Constitutional: NAD, Well developed, Well nourished, alert and cooperative Head:  Normocephalic and atraumatic. Eyes:   PEERL, EOMI. No icterus. Conjunctiva pink. Respiratory: Respirations even and unlabored. Lungs clear to auscultation bilaterally.   No wheezes, crackles, or rhonchi.  Cardiovascular:  Regular rate and rhythm. No peripheral edema, cyanosis or pallor.  Gastrointestinal:  Soft, nondistended, nontender. No rebound or guarding. Normal bowel sounds. No appreciable masses or hepatomegaly. Rectal:  Not performed.  Msk:  Symmetrical without gross deformities. Without edema, no deformity or joint abnormality.  Neurologic:  Alert and  oriented x4;  grossly normal neurologically.  Skin:   Dry and intact without significant lesions or rashes. Psychiatric: Oriented to person, place and time. Demonstrates good judgement and reason without abnormal affect or behaviors.   RELEVANT LABS AND IMAGING: CBC    Component Value Date/Time   WBC 8.1 10/24/2022 1313   WBC 8.9 08/29/2019 0851   RBC 4.54 10/24/2022 1313   HGB 13.0 10/24/2022 1313   HGB 13.3 05/19/2018 1153   HGB 12.9 03/12/2017 1116   HCT 39.9 10/24/2022 1313   HCT 41.3 05/19/2018 1153   HCT 39.2 03/12/2017 1116   PLT 218 10/24/2022 1313   PLT 240 05/19/2018 1153   MCV 87.9 10/24/2022 1313   MCV 87 05/19/2018 1153   MCV 86.6 03/12/2017 1116   MCH 28.6 10/24/2022 1313   MCHC 32.6 10/24/2022 1313  RDW 13.1  10/24/2022 1313   RDW 12.2 (L) 05/19/2018 1153   RDW 13.3 03/12/2017 1116   LYMPHSABS 2.3 10/24/2022 1313   LYMPHSABS 1.6 03/12/2017 1116   MONOABS 0.6 10/24/2022 1313   MONOABS 0.5 03/12/2017 1116   EOSABS 0.1 10/24/2022 1313   EOSABS 0.1 03/12/2017 1116   BASOSABS 0.1 10/24/2022 1313   BASOSABS 0.0 03/12/2017 1116    CMP     Component Value Date/Time   NA 139 10/24/2022 1313   NA 142 03/31/2019 1320   NA 142 03/12/2017 1116   K 4.2 10/24/2022 1313   K 4.2 03/12/2017 1116   CL 101 10/24/2022 1313   CL 100 03/18/2012 1422   CO2 32 10/24/2022 1313   CO2 25 03/12/2017 1116   GLUCOSE 101 (H) 10/24/2022 1313   GLUCOSE 87 03/12/2017 1116   GLUCOSE 92 03/18/2012 1422   BUN 25 (H) 10/24/2022 1313   BUN 15 03/31/2019 1320   BUN 18.8 03/12/2017 1116   CREATININE 1.03 (H) 10/24/2022 1313   CREATININE 0.8 03/12/2017 1116   CALCIUM 9.3 10/24/2022 1313   CALCIUM 9.7 03/12/2017 1116   PROT 6.6 10/24/2022 1313   PROT 6.5 03/12/2017 1116   ALBUMIN 4.1 10/24/2022 1313   ALBUMIN 3.6 03/12/2017 1116   AST 22 10/24/2022 1313   AST 27 03/12/2017 1116   ALT 24 10/24/2022 1313   ALT 32 03/12/2017 1116   ALKPHOS 76 10/24/2022 1313   ALKPHOS 71 03/12/2017 1116   BILITOT 0.7 10/24/2022 1313   BILITOT 0.64 03/12/2017 1116   GFRNONAA 57 (L) 10/24/2022 1313   GFRAA >60 10/26/2019 1145     Assessment/Plan:   History of colon cancer s/p resection 2015 History of colon polyps Last colonoscopy in 2021 with 1 mm polyp that had pathology for benign colonic mucosa.  Although she did have a 5-year recall she continues to struggle with constipation which is a change in bowel habits for her and she would like a colonoscopy completed early.  I feel this is reasonable. - Schedule colonoscopy - I thoroughly discussed the procedure with the patient (at bedside) to include nature of the procedure, alternatives, benefits, and risks (including but not limited to bleeding, infection, perforation,  anesthesia/cardiac pulmonary complications).  Patient verbalized understanding and gave verbal consent to proceed with procedure.    Constipation Chronic constipation not improved on MiraLAX and stool softeners.  Thought to be Eliquis induced. - Provided samples of Linzess 145 mcg - Increase water, increase fiber, increase exercise - Evaluate during colonoscopy - Follow-up per colonoscopy  A-fib, CVA, TIA, CAD History of A-fib on Eliquis and stroke 03/2022 with possible TIA 07/2022 and cardioversion 09/2022.  At last appointment colonoscopy was deferred due to recent stroke.  She is currently over 1 year out from her stroke and 1 year out from her cardioversion.  Ejection fraction adequate on recent echo. - Cardiac clearance - Will hold Eliquis 2 days prior to endoscopic procedures - will instruct when and how to resume after procedure. Benefits and risks of procedure explained including risks of bleeding, perforation, infection, missed lesions, reactions to medications and possible need for hospitalization and surgery for complications. Additional rare but real risk of stroke or other vascular clotting events off Eliquis also explained and need to seek urgent help if any signs of these problems occur. Will communicate by phone or EMR with patient's  prescribing provider to confirm that holding Eliquis is reasonable in this case. - [facility] candidate  Eileen Esch, PA-C  [facility] 10/09/2023, 8:25 AM  Cc: Noberto Retort, MD

## 2023-10-13 ENCOUNTER — Telehealth: Payer: Self-pay

## 2023-10-13 NOTE — Telephone Encounter (Signed)
 Attempted to reach patient in regards to Eliquis hold for procedure and left a voicemail.

## 2023-10-14 NOTE — Telephone Encounter (Signed)
 Contacted patient and patient verbalized understanding to hold Eliquis 2 days prior to her procedure per Cardiology.

## 2023-10-16 ENCOUNTER — Other Ambulatory Visit (HOSPITAL_COMMUNITY): Payer: Self-pay

## 2023-10-24 ENCOUNTER — Other Ambulatory Visit: Payer: Self-pay

## 2023-10-24 ENCOUNTER — Other Ambulatory Visit: Payer: Medicare Other

## 2023-10-24 ENCOUNTER — Ambulatory Visit: Payer: Medicare Other | Admitting: Adult Health

## 2023-10-24 DIAGNOSIS — Z853 Personal history of malignant neoplasm of breast: Secondary | ICD-10-CM

## 2023-10-27 ENCOUNTER — Inpatient Hospital Stay: Payer: Medicare Other | Attending: Adult Health | Admitting: Adult Health

## 2023-10-27 ENCOUNTER — Inpatient Hospital Stay: Payer: Medicare Other

## 2023-10-27 ENCOUNTER — Other Ambulatory Visit: Payer: Self-pay

## 2023-10-27 VITALS — BP 163/76 | HR 61 | Temp 97.7°F | Resp 17 | Wt 138.1 lb

## 2023-10-27 DIAGNOSIS — Z9221 Personal history of antineoplastic chemotherapy: Secondary | ICD-10-CM | POA: Insufficient documentation

## 2023-10-27 DIAGNOSIS — Z87891 Personal history of nicotine dependence: Secondary | ICD-10-CM | POA: Insufficient documentation

## 2023-10-27 DIAGNOSIS — Z8673 Personal history of transient ischemic attack (TIA), and cerebral infarction without residual deficits: Secondary | ICD-10-CM | POA: Insufficient documentation

## 2023-10-27 DIAGNOSIS — Z853 Personal history of malignant neoplasm of breast: Secondary | ICD-10-CM | POA: Diagnosis present

## 2023-10-27 DIAGNOSIS — R5383 Other fatigue: Secondary | ICD-10-CM | POA: Diagnosis not present

## 2023-10-27 DIAGNOSIS — Z860100 Personal history of colon polyps, unspecified: Secondary | ICD-10-CM | POA: Insufficient documentation

## 2023-10-27 DIAGNOSIS — E876 Hypokalemia: Secondary | ICD-10-CM | POA: Insufficient documentation

## 2023-10-27 DIAGNOSIS — I5022 Chronic systolic (congestive) heart failure: Secondary | ICD-10-CM | POA: Insufficient documentation

## 2023-10-27 DIAGNOSIS — Z85038 Personal history of other malignant neoplasm of large intestine: Secondary | ICD-10-CM | POA: Insufficient documentation

## 2023-10-27 DIAGNOSIS — I110 Hypertensive heart disease with heart failure: Secondary | ICD-10-CM | POA: Diagnosis not present

## 2023-10-27 DIAGNOSIS — Z7901 Long term (current) use of anticoagulants: Secondary | ICD-10-CM | POA: Insufficient documentation

## 2023-10-27 LAB — CBC WITH DIFFERENTIAL (CANCER CENTER ONLY)
Abs Immature Granulocytes: 0.02 10*3/uL (ref 0.00–0.07)
Basophils Absolute: 0 10*3/uL (ref 0.0–0.1)
Basophils Relative: 0 %
Eosinophils Absolute: 0 10*3/uL (ref 0.0–0.5)
Eosinophils Relative: 0 %
HCT: 39 % (ref 36.0–46.0)
Hemoglobin: 12.7 g/dL (ref 12.0–15.0)
Immature Granulocytes: 0 %
Lymphocytes Relative: 11 %
Lymphs Abs: 1.1 10*3/uL (ref 0.7–4.0)
MCH: 29.2 pg (ref 26.0–34.0)
MCHC: 32.6 g/dL (ref 30.0–36.0)
MCV: 89.7 fL (ref 80.0–100.0)
Monocytes Absolute: 0.8 10*3/uL (ref 0.1–1.0)
Monocytes Relative: 9 %
Neutro Abs: 7.5 10*3/uL (ref 1.7–7.7)
Neutrophils Relative %: 80 %
Platelet Count: 212 10*3/uL (ref 150–400)
RBC: 4.35 MIL/uL (ref 3.87–5.11)
RDW: 14 % (ref 11.5–15.5)
WBC Count: 9.5 10*3/uL (ref 4.0–10.5)
nRBC: 0 % (ref 0.0–0.2)

## 2023-10-27 LAB — CMP (CANCER CENTER ONLY)
ALT: 21 U/L (ref 0–44)
AST: 21 U/L (ref 15–41)
Albumin: 4.1 g/dL (ref 3.5–5.0)
Alkaline Phosphatase: 107 U/L (ref 38–126)
Anion gap: 7 (ref 5–15)
BUN: 23 mg/dL (ref 8–23)
CO2: 32 mmol/L (ref 22–32)
Calcium: 9.2 mg/dL (ref 8.9–10.3)
Chloride: 98 mmol/L (ref 98–111)
Creatinine: 1.03 mg/dL — ABNORMAL HIGH (ref 0.44–1.00)
GFR, Estimated: 56 mL/min — ABNORMAL LOW (ref >60)
Glucose, Bld: 95 mg/dL (ref 70–99)
Potassium: 3.3 mmol/L — ABNORMAL LOW (ref 3.5–5.1)
Sodium: 137 mmol/L (ref 135–145)
Total Bilirubin: 0.8 mg/dL (ref 0.0–1.2)
Total Protein: 6.5 g/dL (ref 6.5–8.1)

## 2023-10-27 LAB — CEA (ACCESS): CEA (CHCC): 5.7 ng/mL — ABNORMAL HIGH (ref 0.00–5.00)

## 2023-10-27 NOTE — Progress Notes (Unsigned)
 [facility] Cancer Follow up:    Eileen Retort, MD [address] [place] [address]   DIAGNOSIS: 1.  Breast cancer diagnosed in 1995.  She was found to have locally advanced breast cancer treated with high-dose chemotherapy with stem cell transplant.     2.  Stage IIa colon cancer diagnosed in 2006.   SUMMARY OF ONCOLOGIC HISTORY: 1. She is status post lumpectomy and lymph node dissection followed by [facility] and radiation therapy. 2. She is status post 4 cycles of Adriamycin and Taxotere followed by stem cell transplantation at [facility] on 08/16/1997 due to recurrence and the left supraclavicular lymph node. Her tumor is ER negative PR positive. 3. She is status post right colectomy for stage IIA adenocarcinoma of the colon in October of 2006. She is status post 10 cycles of FOLFOX concluded in 06/2005 4. Femara taken between 2001 and 2017.  CURRENT THERAPY:  INTERVAL HISTORY: Eileen [name] 78 y.o. female returns for    Patient Active Problem List   Diagnosis Date Noted  . Polyp of descending colon   . Chronic systolic heart failure (HCC) 05/21/2018  . Cardiomyopathy (HCC)   . Hypertension 11/06/2017  . Complete rotator cuff rupture of left shoulder 09/21/2014  . Abdominal pain, epigastric 08/24/2013  . Belching 08/24/2013  . Other specified abnormal findings of blood chemistry 01/21/2012  . Nonspecific (abnormal) findings on radiological and other examination of biliary tract 01/21/2012  . TIA (transient ischemic attack) 11/05/2011  . PVC's (premature ventricular contractions) 10/11/2011  . Nonspecific abnormal results of cardiovascular function study 12/20/2010  . History of breast cancer 08/23/2008  . HEMORRHOIDS 08/23/2008  . CONSTIPATION, CHRONIC 08/23/2008  . RECTAL BLEEDING 08/23/2008  . History of colon cancer 08/23/2008    is allergic to sacubitril-valsartan, carvedilol, levofloxacin, methocarbamol, tape, and  tramadol.  MEDICAL HISTORY: Past Medical History:  Diagnosis Date  . Anemia   . Axillary mass 01/1998   Left  . Barrett's esophagus   . Blood transfusion without reported diagnosis   . CARCINOMA, BREAST 1995   left  . Cervical spinal stenosis   . 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  . Headache(784.0)   . HEMORRHOIDS   . Hypertension   . Malignant neoplasm of colon (HCC) 01/26/2005   Qualifier: History of   By: Joselyn Glassman      . Personal history of chemotherapy   . Personal history of radiation therapy   . Stroke [facility]) 03/30/2022    SURGICAL HISTORY: Past Surgical History:  Procedure Laterality Date  . BIOPSY  08/10/2019   Procedure: BIOPSY;  Surgeon: Napoleon Form, MD;  Location: [facility];  Service: Endoscopy;;  . BONE MARROW TRANSPLANT  1999  . BREAST LUMPECTOMY Left 11/95   left breast  . BUNIONECTOMY  3/09, 10/09   right and left  . COLON SURGERY    . COLONOSCOPY    . COLONOSCOPY WITH PROPOFOL [name] 08/10/2019   Procedure: COLONOSCOPY WITH PROPOFOL;  Surgeon: Napoleon Form, MD;  Location: [facility];  Service: Endoscopy;  Laterality: [name];  . ERCP  01/21/2012   Procedure: ENDOSCOPIC RETROGRADE CHOLANGIOPANCREATOGRAPHY (ERCP);  Surgeon: Hart Carwin, MD;  Location: [facility];  Service: Endoscopy;  Laterality: [name];  . hemicolectomy  01/2005   right  . HYSTEROSCOPY  08/30/94  . INGUINAL HERNIA REPAIR    . LEFT HEART CATH AND CORONARY ANGIOGRAPHY [name] 05/21/2018   Procedure: LEFT HEART CATH AND CORONARY ANGIOGRAPHY;  Surgeon: Yvonne Kendall, MD;  [facility];  Service: Cardiovascular;  Laterality: [name];  . NECK MASS EXCISION Left 7/99  . ROTATOR CUFF REPAIR Right 09/08/13   full tear  . TOTAL ABDOMINAL HYSTERECTOMY W/ BILATERAL SALPINGOOPHORECTOMY  1996  . TUNNELED VENOUS CATHETER PLACEMENT  9/98    SOCIAL HISTORY: Social History   Socioeconomic History  . Marital status: Married    Spouse name: Not on file  . Number of  children: 2  . Years of education: Not on file  . Highest education level: Not on file  Occupational History  . Not on file  Tobacco Use  . Smoking status: Former    Current packs/day: 0.00    Types: Cigarettes    Quit date: 12/20/1990    Years since quitting: 32.8  . Smokeless tobacco: Never  Vaping Use  . Vaping status: Never Used  Substance and Sexual Activity  . Alcohol use: Not Currently  . Drug use: Never  . Sexual activity: Not Currently    Birth control/protection: Surgical    Comment: hysterectomy  Other Topics Concern  . Not on file  Social History Narrative   ** Merged History Encounter **       Social Drivers of Health   Financial Resource Strain: Low Risk  (08/15/2022)   Received from [facility], [facility]   Overall Financial Resource Strain (CARDIA)   . Difficulty of Paying Living Expenses: Not hard at all  Food Insecurity: No Food Insecurity (08/15/2022)   Received from [facility], [facility]   Hunger Vital Sign   . Worried About Programme researcher, broadcasting/film/video in the Last Year: Never true   . Ran Out of Food in the Last Year: Never true  Transportation Needs: No Transportation Needs (08/15/2022)   Received from [facility], [facility]   [facility] - Transportation   . Lack of Transportation (Medical): No   . Lack of Transportation (Non-Medical): No  Physical Activity: Insufficiently Active (10/10/2022)   Received from [facility], [facility]   Exercise Vital Sign   . Days of Exercise per Week: 3 days   . Minutes of Exercise per Session: 20 min  Stress: No Stress Concern Present (08/04/2022)   Received from [facility], [facility] of Occupational Health - Occupational Stress Questionnaire   . Feeling of Stress : Not at all  Social Connections: Unknown (12/07/2021)   Received from [facility], [facility]   Social Network   . Social Network: Not on file  Intimate Partner Violence: Not At Risk (04/01/2023)   Received from  [facility]   HITS   . Over the last 12 months how often did your partner physically hurt you?: Never   . Over the last 12 months how often did your partner insult you or talk down to you?: Never   . Over the last 12 months how often did your partner threaten you with physical harm?: Never   . Over the last 12 months how often did your partner scream or curse at you?: Never    FAMILY HISTORY: Family History  Problem Relation Age of Onset  . Uterine cancer Mother   . Heart attack Father   . Colon cancer Neg Hx   . Esophageal cancer Neg Hx   . Stomach cancer Neg Hx   . Rectal cancer Neg Hx     Review of Systems - Oncology    PHYSICAL EXAMINATION    Vitals:  10/27/23 1252  BP: (!) 163/76  Pulse: 61  Resp: 17  Temp: 97.7 F (36.5 C)  SpO2: 99%    Physical Exam  LABORATORY DATA:  CBC    Component Value Date/Time   WBC 9.5 10/27/2023 1223   WBC 8.9 08/29/2019 0851   RBC 4.35 10/27/2023 1223   HGB 12.7 10/27/2023 1223   HGB 13.3 05/19/2018 1153   HGB 12.9 03/12/2017 1116   HCT 39.0 10/27/2023 1223   HCT 41.3 05/19/2018 1153   HCT 39.2 03/12/2017 1116   PLT 212 10/27/2023 1223   PLT 240 05/19/2018 1153   MCV 89.7 10/27/2023 1223   MCV 87 05/19/2018 1153   MCV 86.6 03/12/2017 1116   MCH 29.2 10/27/2023 1223   MCHC 32.6 10/27/2023 1223   RDW 14.0 10/27/2023 1223   RDW 12.2 (L) 05/19/2018 1153   RDW 13.3 03/12/2017 1116   LYMPHSABS 1.1 10/27/2023 1223   LYMPHSABS 1.6 03/12/2017 1116   MONOABS 0.8 10/27/2023 1223   MONOABS 0.5 03/12/2017 1116   EOSABS 0.0 10/27/2023 1223   EOSABS 0.1 03/12/2017 1116   BASOSABS 0.0 10/27/2023 1223   BASOSABS 0.0 03/12/2017 1116    CMP     Component Value Date/Time   NA 137 10/27/2023 1223   NA 142 03/31/2019 1320   NA 142 03/12/2017 1116   K 3.3 (L) 10/27/2023 1223   K 4.2 03/12/2017 1116   CL 98 10/27/2023 1223   CL 100 03/18/2012 1422   CO2 32 10/27/2023 1223   CO2 25 03/12/2017 1116   GLUCOSE 95 10/27/2023  1223   GLUCOSE 87 03/12/2017 1116   GLUCOSE 92 03/18/2012 1422   BUN 23 10/27/2023 1223   BUN 15 03/31/2019 1320   BUN 18.8 03/12/2017 1116   CREATININE 1.03 (H) 10/27/2023 1223   CREATININE 0.8 03/12/2017 1116   CALCIUM 9.2 10/27/2023 1223   CALCIUM 9.7 03/12/2017 1116   PROT 6.5 10/27/2023 1223   PROT 6.5 03/12/2017 1116   ALBUMIN 4.1 10/27/2023 1223   ALBUMIN 3.6 03/12/2017 1116   AST 21 10/27/2023 1223   AST 27 03/12/2017 1116   ALT 21 10/27/2023 1223   ALT 32 03/12/2017 1116   ALKPHOS 107 10/27/2023 1223   ALKPHOS 71 03/12/2017 1116   BILITOT 0.8 10/27/2023 1223   BILITOT 0.64 03/12/2017 1116   GFRNONAA 56 (L) 10/27/2023 1223   GFRAA >60 10/26/2019 1145       PENDING LABS:   RADIOGRAPHIC STUDIES:  No results found.   PATHOLOGY:     ASSESSMENT and THERAPY PLAN:   No problem-specific Assessment & Plan notes found for this encounter.   No orders of the defined types were placed in this encounter.   All questions were answered. The patient knows to call the clinic with any problems, questions or concerns. We can certainly see the patient much sooner if necessary. This note was electronically signed. Noreene Filbert, NP 10/27/2023

## 2023-10-28 ENCOUNTER — Encounter: Payer: Self-pay | Admitting: Adult Health

## 2023-10-28 ENCOUNTER — Telehealth: Payer: Self-pay | Admitting: Adult Health

## 2023-10-28 DIAGNOSIS — Z85038 Personal history of other malignant neoplasm of large intestine: Secondary | ICD-10-CM

## 2023-10-28 DIAGNOSIS — E876 Hypokalemia: Secondary | ICD-10-CM | POA: Insufficient documentation

## 2023-10-28 DIAGNOSIS — Z853 Personal history of malignant neoplasm of breast: Secondary | ICD-10-CM

## 2023-10-28 DIAGNOSIS — R978 Other abnormal tumor markers: Secondary | ICD-10-CM

## 2023-10-28 LAB — CANCER ANTIGEN 27.29: CA 27.29: 40.7 U/mL — ABNORMAL HIGH (ref 0.0–38.6)

## 2023-10-28 NOTE — Telephone Encounter (Signed)
 Called patient to review elevated CA 2729 and CEA testing.  Unable to leave message.  My chart message sent.  Lillard Anes, NP 10/28/23 11:20 AM Medical Oncology and Hematology [facility] [address] [place], [place] [address] Tel. [phone]    Fax. [phone]

## 2023-10-28 NOTE — Assessment & Plan Note (Signed)
 Eileen [name] is a 78 year old woman with history of breast cancer and colon cancer.   Breast cancer surveillance Undergoing routine surveillance for breast cancer recurrence. Mammogram from September was normal, and the current breast exam did not reveal any concerning findings. She expressed concern about the possibility of breast cancer recurrence after many years. Labs including [phone] and CEA have been added to monitor for recurrence. - Continue annual mammograms - Perform routine breast exams - Notify her of lab results once available - Colonoscopy scheduled 11/06/2023 with Dr. Lavon Paganini  Stroke Experienced a stroke, resulting in residual balance issues. Currently on Eliquis for stroke prevention. No new neurological symptoms reported. - Continue Eliquis for stroke prevention  Fatigue Reports persistent fatigue, which may be related to her medications or other underlying conditions. Denies unintentional weight loss. No bowel or bladder changes noted except for constipation managed with diet. - Evaluate current medications for potential side effects contributing to fatigue - Consider further evaluation if fatigue persists  Hypokalemia Potassium level is slightly low at 3.3 mmol/L. Consuming potassium-rich foods since her stroke. Potassium-rich foods include bananas, watermelon, sweet potatoes, regular potatoes, and broccoli. - Encourage increased intake of potassium-rich foods - Send lab results to Dr. Tiburcio Pea for further evaluation  RTC in 1 year for long-term

## 2023-10-29 DIAGNOSIS — R531 Weakness: Secondary | ICD-10-CM | POA: Insufficient documentation

## 2023-10-30 ENCOUNTER — Telehealth: Payer: Self-pay | Admitting: Adult Health

## 2023-10-30 DIAGNOSIS — A045 Campylobacter enteritis: Secondary | ICD-10-CM | POA: Insufficient documentation

## 2023-10-30 NOTE — Telephone Encounter (Signed)
 Spoke with patient's husband who informed me patient was admitted at [facility].  I conveyed elevated tumor marker results and the need to obtain imaging once she is discharged from the hospital.  He agrees to call once she is discharged so we can order.  Lillard Anes, NP 10/30/23 10:22 AM Medical Oncology and Hematology [facility] [address] [place], [place] [address] Tel. [phone]    Fax. [phone]

## 2023-11-04 ENCOUNTER — Telehealth: Payer: Self-pay | Admitting: *Deleted

## 2023-11-04 NOTE — Telephone Encounter (Signed)
 Recently pt tumor markers were elevated. Provider called to speak with pt but pt had been admitted to [facility] for hypokalemia. Pt was discharged and was made aware of elevated tumor markers and provider wanting to obtain scans to further evaluate. Pt OK with provider recommendations. Advised pt once scans are approved we will schedule. Pt verbalize understanding.

## 2023-11-05 ENCOUNTER — Telehealth: Payer: Self-pay | Admitting: Gastroenterology

## 2023-11-05 ENCOUNTER — Encounter (INDEPENDENT_AMBULATORY_CARE_PROVIDER_SITE_OTHER): Admitting: Ophthalmology

## 2023-11-05 ENCOUNTER — Ambulatory Visit (INDEPENDENT_AMBULATORY_CARE_PROVIDER_SITE_OTHER): Admitting: Ophthalmology

## 2023-11-05 ENCOUNTER — Encounter (INDEPENDENT_AMBULATORY_CARE_PROVIDER_SITE_OTHER): Payer: Self-pay | Admitting: Ophthalmology

## 2023-11-05 DIAGNOSIS — Z961 Presence of intraocular lens: Secondary | ICD-10-CM | POA: Diagnosis not present

## 2023-11-05 DIAGNOSIS — Z8673 Personal history of transient ischemic attack (TIA), and cerebral infarction without residual deficits: Secondary | ICD-10-CM

## 2023-11-05 DIAGNOSIS — H35033 Hypertensive retinopathy, bilateral: Secondary | ICD-10-CM

## 2023-11-05 DIAGNOSIS — I10 Essential (primary) hypertension: Secondary | ICD-10-CM | POA: Diagnosis not present

## 2023-11-05 DIAGNOSIS — H353131 Nonexudative age-related macular degeneration, bilateral, early dry stage: Secondary | ICD-10-CM | POA: Diagnosis not present

## 2023-11-05 NOTE — Telephone Encounter (Signed)
 Patient's instructions were corrected and mailed. Attempted to contact patient and left her a voicemail to return call.

## 2023-11-05 NOTE — Telephone Encounter (Signed)
 Inbound call from patient stating that she wanted to schedule her colonoscopy that she had but had to cancel due to being in the hospital. Patient was rescheduled for 5/ 12 at 1:30 with Dr. Lavon Paganini and is requesting a call back tomorrow to discuss new prep instructions. Please advise.

## 2023-11-05 NOTE — Progress Notes (Signed)
 [facility] - Clinic Note  11/05/2023   CHIEF COMPLAINT Patient presents for Retina Evaluation  HISTORY OF PRESENT ILLNESS: Eileen [name] is a 78 y.o. female who presents to the clinic today for:  HPI     Retina Evaluation   In both eyes.  This started 2 weeks ago.  Duration of 2 weeks.  I, the attending physician,  performed the HPI with the patient and updated documentation appropriately.        Comments   Retina eval per Dr Dione Booze AMD OU pt is reporting she had a stroke 2023 and she states it effected her vision she denies any flashes or floaters       Last edited by Rennis Chris, MD on 11/06/2023  3:14 PM.    Pt is here on the referral of Dr. Dione Booze for non-exu ARMD OU, pt is being followed there for a visual field defect following a stroke in 2023, she states she has some parts of her vision missing peripherally in her left  Referring physician: Ernesto Rutherford, MD [address] [place],  [place] [address]  HISTORICAL INFORMATION:  Selected notes from the MEDICAL RECORD NUMBER Referred by Dr. Ernesto Rutherford for ARMD eval LEE:  Ocular Hx- h/o CVA w/ visual field defect PMH-   CURRENT MEDICATIONS: No current outpatient medications on file. (Ophthalmic Drugs)   No current facility-administered medications for this visit. (Ophthalmic Drugs)   Current Outpatient Medications (Other)  Medication Sig   acetaminophen (TYLENOL) 500 MG tablet Take 500 mg by mouth every 6 (six) hours as needed for moderate pain or headache.   amiodarone (PACERONE) 200 MG tablet Take 200 mg by mouth. Takes Mon-Fri, not on weekends   apixaban (ELIQUIS) 5 MG TABS tablet Take 5 mg by mouth 2 (two) times daily.   atorvastatin (LIPITOR) 80 MG tablet Take 80 mg by mouth daily.   b complex vitamins tablet Take 1 tablet by mouth 2 (two) times a week.    carvedilol (COREG) 25 MG tablet Take 1 tablet (25 mg total) by mouth daily.   chlorthalidone (HYGROTON) 25 MG tablet Take 12.5 mg by  mouth daily.   losartan (COZAAR) 50 MG tablet Take 50 mg by mouth daily.   magnesium oxide (MAG-OX) 400 MG tablet Take by mouth.   Multiple Minerals (CALCIUM-MAGNESIUM-ZINC) TABS Take 1 tablet by mouth 2 (two) times a week.   POTASSIUM PO Take by mouth.   vitamin B-12 (CYANOCOBALAMIN) 500 MCG tablet Take 500 mcg by mouth See admin instructions. Take 500 mcg 5 days weekly, takes on non b complex days   vitamin C (ASCORBIC ACID) 500 MG tablet Take 500 mg by mouth 2 (two) times daily.    Vitamin D, Ergocalciferol, (DRISDOL) 50000 UNITS CAPS Take 50,000 Units by mouth every Wednesday.    Vitamin D, Ergocalciferol, 50 MCG (2000 UT) CAPS Take by mouth. Daily except on Wednesday   vitamin E 400 UNIT capsule Take 400 Units by mouth daily.   No current facility-administered medications for this visit. (Other)   REVIEW OF SYSTEMS: ROS   Positive for: Neurological, Cardiovascular, Eyes Last edited by Etheleen Mayhew, COT on 11/05/2023  2:11 PM.     ALLERGIES Allergies  Allergen Reactions   Sacubitril-Valsartan Anaphylaxis, Diarrhea and Nausea Only   Carvedilol Other (See Comments)    Other reaction(s): diarrhea   Levofloxacin Other (See Comments)    Red streaks to IV site when IV dose infusing and arm feeling  very irritated, throbbing, painful   Methocarbamol Rash    Other reaction(s): Other   Tape     Can use paper tape, adhesive causes redness   Tramadol Other (See Comments)   PAST MEDICAL HISTORY Past Medical History:  Diagnosis Date   Anemia    Axillary mass 01/1998   Left   Barrett's esophagus    Blood transfusion without reported diagnosis    CARCINOMA, BREAST 1995   left   Cervical spinal stenosis    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   Headache(784.0)    HEMORRHOIDS    Hypertension    Malignant neoplasm of colon (HCC) 01/26/2005   Qualifier: History of   By: Dorian Pod, Pam       Personal history of chemotherapy    Personal history of radiation therapy    Stroke  (HCC) 03/30/2022   Past Surgical History:  Procedure Laterality Date   BIOPSY  08/10/2019   Procedure: BIOPSY;  Surgeon: Napoleon Form, MD;  Location: [facility];  Service: Endoscopy;;   BONE MARROW TRANSPLANT  1999   BREAST LUMPECTOMY Left 11/95   left breast   BUNIONECTOMY  3/09, 10/09   right and left   COLON SURGERY     COLONOSCOPY     COLONOSCOPY WITH PROPOFOL [name] 08/10/2019   Procedure: COLONOSCOPY WITH PROPOFOL;  Surgeon: Napoleon Form, MD;  Location: [facility];  Service: Endoscopy;  Laterality: [name];   ERCP  01/21/2012   Procedure: ENDOSCOPIC RETROGRADE CHOLANGIOPANCREATOGRAPHY (ERCP);  Surgeon: Hart Carwin, MD;  Location: [facility];  Service: Endoscopy;  Laterality: [name];   hemicolectomy  01/2005   right   HYSTEROSCOPY  08/30/94   INGUINAL HERNIA REPAIR     LEFT HEART CATH AND CORONARY ANGIOGRAPHY [name] 05/21/2018   Procedure: LEFT HEART CATH AND CORONARY ANGIOGRAPHY;  Surgeon: Yvonne Kendall, MD;  Location: [facility];  Service: Cardiovascular;  Laterality: [name];   NECK MASS EXCISION Left 7/99   ROTATOR CUFF REPAIR Right 09/08/13   full tear   TOTAL ABDOMINAL HYSTERECTOMY W/ BILATERAL SALPINGOOPHORECTOMY  1996   TUNNELED VENOUS CATHETER PLACEMENT  9/98   FAMILY HISTORY Family History  Problem Relation Age of Onset   Uterine cancer Mother    Heart attack Father    Colon cancer Neg Hx    Esophageal cancer Neg Hx    Stomach cancer Neg Hx    Rectal cancer Neg Hx    SOCIAL HISTORY Social History   Tobacco Use   Smoking status: Former    Current packs/day: 0.00    Types: Cigarettes    Quit date: 12/20/1990    Years since quitting: 32.9   Smokeless tobacco: Never  Vaping Use   Vaping status: Never Used  Substance Use Topics   Alcohol use: Not Currently   Drug use: Never       OPHTHALMIC EXAM:  Base Eye Exam     Visual Acuity (Snellen - Linear)       Right Left   Dist [place] 20/30 -2 20/40 -1   Dist ph [place] 20/20 -3 20/25 -3          Tonometry (Tonopen, 2:19 PM)       Right Left   Pressure 13 11         Pupils       Pupils Dark Light Shape React APD   Right PERRL 3 2 Round Brisk None   Left PERRL 3 2 Round Brisk None  Visual Fields       Left Right    Full Full         Extraocular Movement       Right Left    Full, Ortho Full, Ortho         Neuro/Psych     Oriented x3: Yes   Mood/Affect: Normal         Dilation     Both eyes: 2.5% Phenylephrine @ 2:19 PM           Slit Lamp and Fundus Exam     Slit Lamp Exam       Right Left   Lids/Lashes Dermatochalasis - upper lid Dermatochalasis - upper lid   Conjunctiva/Sclera White and quiet White and quiet   Cornea well healed cataract wound, tear film debris, mild corneal verticillata well healed cataract wound, tear film debris, mild corneal verticillata   Anterior Chamber deep and clear deep and clear   Iris Round and dilated Round and dilated   Lens PC IOL in good position, trace posterior capsular opacification PC IOL in good position, trace posterior capsular opacification         Fundus Exam       Right Left   Disc Pink and Sharp Pink and Sharp, no heme   C/D Ratio 0.3 0.3   Macula Flat, Good foveal reflex, fine drusen, RPE mottling and clumping, No heme or edema Flat, blunted foveal reflex, drusen, central PEDs, No heme or edema   Vessels attenuated attenuated, mild tortuosity   Periphery Attached, No heme Attached, No heme           IMAGING AND PROCEDURES  Imaging and Procedures for 11/05/2023  OCT, Retina - OU - Both Eyes       Right Eye Quality was good. Central Foveal Thickness: 276. Progression has no prior data. Findings include normal foveal contour, no IRF, no SRF, retinal drusen , pigment epithelial detachment, vitreomacular adhesion .   Left Eye Quality was good. Central Foveal Thickness: 281. Progression has no prior data. Findings include normal foveal contour, no IRF, no SRF, retinal  drusen , pigment epithelial detachment (Moderate PED centrally).   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 CME - cystoid macular edema. PED - pigment epithelial detachment. IRF - intraretinal fluid. SRF - subretinal fluid. EZ - ellipsoid zone. ERM - epiretinal membrane. ORA - outer retinal atrophy. ORT - outer retinal tubulation. SRHM - subretinal hyper-reflective material. IRHM - intraretinal hyper-reflective material           ASSESSMENT/PLAN:   ICD-10-CM   1. Early dry stage nonexudative age-related macular degeneration of both eyes  H35.3131 OCT, Retina - OU - Both Eyes    2. Essential hypertension  I10     3. Hypertensive retinopathy of both eyes  H35.033     4. Pseudophakia, both eyes  Z96.1     5. History of CVA (cerebrovascular accident)  Z29.73      Age related macular degeneration, non-exudative, both eyes  - The incidence, anatomy, and pathology of dry AMD, risk of progression, and the AREDS and AREDS 2 study including smoking risks discussed with patient.  - Recommend amsler grid monitoring  - f/u 3-4 months, DFE, OCT  2,3. Hypertensive retinopathy OU - discussed importance of tight BP control - monitor  4. Pseudophakia OU  -  s/p CE/IOL OU (Dr. Soyla Murphy -- [facility], June 2024)  - IOL in good position, doing well  - monitor  5. 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    Return in about 4 months (around 03/06/2024) for nonex ARMD OU, Dilated Exam, OCT.  There are no Patient Instructions on file for this visit.  Explained the diagnoses, plan, and follow up with the patient and they expressed understanding.  Patient expressed  understanding of the importance of proper follow up care.   This document serves as a record of services personally performed by Karie Chimera, MD, PhD. It was created on their behalf by Glee Arvin. Manson Passey, OA an ophthalmic technician. The creation of this record is the provider's dictation and/or activities during the visit.    Electronically signed by: Glee Arvin. Manson Passey, OA 11/06/23 3:23 PM  Karie Chimera, M.D., Ph.D. Diseases & Surgery of the Retina and Vitreous [facility] 11/05/2023  I have reviewed the above documentation for accuracy and completeness, and I agree with the above. Karie Chimera, M.D., Ph.D. 11/06/23 3:23 PM   Abbreviations: M myopia (nearsighted); A astigmatism; H hyperopia (farsighted); P presbyopia; Mrx spectacle prescription;  CTL contact lenses; OD right eye; OS left eye; OU both eyes  XT exotropia; ET esotropia; PEK punctate epithelial keratitis; PEE punctate epithelial erosions; DES dry eye syndrome; MGD meibomian gland dysfunction; ATs artificial tears; PFAT's preservative free artificial tears; NSC nuclear sclerotic cataract; PSC posterior subcapsular cataract; ERM epi-retinal membrane; PVD posterior vitreous detachment; RD retinal detachment; DM diabetes mellitus; DR diabetic retinopathy; NPDR non-proliferative diabetic retinopathy; PDR proliferative diabetic retinopathy; CSME clinically significant macular edema; DME diabetic macular edema; dbh dot blot hemorrhages; CWS cotton wool spot; POAG primary open angle glaucoma; C/D cup-to-disc ratio; HVF humphrey visual field; GVF goldmann visual field; OCT optical coherence tomography; IOP intraocular pressure; BRVO Branch retinal vein occlusion; CRVO central retinal vein occlusion; CRAO central retinal artery occlusion; BRAO branch retinal artery occlusion; RT retinal tear; SB scleral buckle; PPV pars plana vitrectomy; VH Vitreous hemorrhage; PRP panretinal laser photocoagulation; IVK intravitreal  kenalog; VMT vitreomacular traction; MH Macular hole;  NVD neovascularization of the disc; NVE neovascularization elsewhere; AREDS age related eye disease study; ARMD age related macular degeneration; POAG primary open angle glaucoma; EBMD epithelial/anterior basement membrane dystrophy; ACIOL anterior chamber intraocular lens; IOL intraocular lens; PCIOL posterior chamber intraocular lens; Phaco/IOL phacoemulsification with intraocular lens placement; PRK photorefractive keratectomy; LASIK laser assisted in situ keratomileusis; HTN hypertension; DM diabetes mellitus; COPD chronic obstructive pulmonary disease

## 2023-11-06 ENCOUNTER — Encounter: Admitting: Gastroenterology

## 2023-11-06 ENCOUNTER — Telehealth: Payer: Self-pay | Admitting: *Deleted

## 2023-11-06 ENCOUNTER — Encounter (INDEPENDENT_AMBULATORY_CARE_PROVIDER_SITE_OTHER): Payer: Self-pay | Admitting: Ophthalmology

## 2023-11-06 NOTE — Telephone Encounter (Signed)
 Called pt to make aware that NM bone scan and CT scan has been scheduled for 4/18, advised to be NPO 4 hrs prior to appt which is 7am. Arrival time 11 am at [facility]. Pt verbalized understanding.

## 2023-11-06 NOTE — Telephone Encounter (Signed)
 Patient returned call. Patient is aware that new instructions were mailed out and to hold Eliquis starting on May 9th.

## 2023-11-14 ENCOUNTER — Encounter (HOSPITAL_COMMUNITY)
Admission: RE | Admit: 2023-11-14 | Discharge: 2023-11-14 | Disposition: A | Discharge status: Home / Self Care | Source: Ambulatory Visit | Attending: Adult Health | Admitting: Adult Health

## 2023-11-14 ENCOUNTER — Ambulatory Visit (HOSPITAL_COMMUNITY)
Admission: RE | Admit: 2023-11-14 | Discharge: 2023-11-14 | Disposition: A | Discharge status: Home / Self Care | Source: Ambulatory Visit | Attending: Adult Health | Admitting: Adult Health

## 2023-11-14 DIAGNOSIS — Z853 Personal history of malignant neoplasm of breast: Secondary | ICD-10-CM | POA: Insufficient documentation

## 2023-11-14 DIAGNOSIS — Z85038 Personal history of other malignant neoplasm of large intestine: Secondary | ICD-10-CM | POA: Insufficient documentation

## 2023-11-14 DIAGNOSIS — R978 Other abnormal tumor markers: Secondary | ICD-10-CM | POA: Diagnosis present

## 2023-11-14 MED ORDER — IOHEXOL 9 MG/ML PO SOLN
500.0000 mL | ORAL | Status: AC
  Administered 2023-11-14 (×2): 500 mL via ORAL

## 2023-11-14 MED ORDER — TECHNETIUM TC 99M MEDRONATE IV KIT
20.0000 | PACK | Freq: Once | INTRAVENOUS | Status: DC
Start: 2023-11-14 — End: 2023-11-14

## 2023-11-14 MED ORDER — TECHNETIUM TC 99M MEDRONATE IV KIT
20.0000 | PACK | Freq: Once | INTRAVENOUS | Status: AC | PRN
  Administered 2023-11-14: 21 via INTRAVENOUS

## 2023-11-14 MED ORDER — IOHEXOL 300 MG/ML  SOLN
100.0000 mL | Freq: Once | INTRAMUSCULAR | Status: AC | PRN
  Administered 2023-11-14: 100 mL via INTRAVENOUS

## 2023-11-20 ENCOUNTER — Telehealth: Payer: Self-pay | Admitting: *Deleted

## 2023-11-20 NOTE — Telephone Encounter (Signed)
-----   Message from Conway Dennis sent at 11/20/2023  9:52 AM EDT ----- Please tell patient  the good news that scans do not show any signs of cancer.  I would like ot touch base with her after her colonoscopy if that is ok with her. ----- Message ----- From: Interface, Rad Results In Sent: 11/17/2023   1:28 PM EDT To: Percival Brace, NP

## 2023-11-20 NOTE — Telephone Encounter (Signed)
 LM to call Lezlie Reddish nurse

## 2023-11-25 ENCOUNTER — Telehealth: Payer: Self-pay | Admitting: Adult Health

## 2023-11-25 ENCOUNTER — Telehealth: Payer: Self-pay | Admitting: *Deleted

## 2023-11-25 NOTE — Telephone Encounter (Signed)
 Spoke with patient confirming upcoming appointment

## 2023-11-25 NOTE — Telephone Encounter (Signed)
 Returned pt call from last week to f/u with results of CT scan. Made pt aware that scans were good which is good news. Advised that [place], NP, would like to f/u with her after colonoscopy. Pt agreed and verbalized understanding.

## 2023-11-28 ENCOUNTER — Encounter: Admitting: Gastroenterology

## 2023-12-07 ENCOUNTER — Telehealth: Payer: Self-pay | Admitting: Physician Assistant

## 2023-12-07 NOTE — Telephone Encounter (Signed)
 Patient called 12:30 PM, stating that she was scheduled for colonoscopy tomorrow and has been off of her blood thinner over the past few days. She says that she is "not feeling right in her head" today and relates that she has had a previous stroke.  Advised patient that we would cancel her colonoscopy for tomorrow, and that she should come to the emergency room to be evaluated.  She asked about resuming her Eliquis which is fine but was told that she still needs to come to the emergency room to be evaluated.  She will need to be contacted later this week to be rescheduled for follow-up colonoscopy with history of remote colon cancer.

## 2023-12-08 ENCOUNTER — Encounter: Admitting: Gastroenterology

## 2023-12-08 NOTE — Telephone Encounter (Signed)
 ok

## 2023-12-09 ENCOUNTER — Encounter: Payer: Self-pay | Admitting: Gastroenterology

## 2023-12-12 DIAGNOSIS — E7800 Pure hypercholesterolemia, unspecified: Secondary | ICD-10-CM | POA: Insufficient documentation

## 2023-12-12 DIAGNOSIS — M5432 Sciatica, left side: Secondary | ICD-10-CM | POA: Insufficient documentation

## 2023-12-12 DIAGNOSIS — F5101 Primary insomnia: Secondary | ICD-10-CM | POA: Insufficient documentation

## 2023-12-12 DIAGNOSIS — M9390 Osteochondropathy, unspecified of unspecified site: Secondary | ICD-10-CM | POA: Insufficient documentation

## 2023-12-12 DIAGNOSIS — M5442 Lumbago with sciatica, left side: Secondary | ICD-10-CM | POA: Insufficient documentation

## 2023-12-12 DIAGNOSIS — L03319 Cellulitis of trunk, unspecified: Secondary | ICD-10-CM | POA: Insufficient documentation

## 2023-12-12 DIAGNOSIS — Z9484 Stem cells transplant status: Secondary | ICD-10-CM | POA: Insufficient documentation

## 2023-12-12 DIAGNOSIS — C50919 Malignant neoplasm of unspecified site of unspecified female breast: Secondary | ICD-10-CM | POA: Insufficient documentation

## 2023-12-12 DIAGNOSIS — G8929 Other chronic pain: Secondary | ICD-10-CM | POA: Insufficient documentation

## 2023-12-12 DIAGNOSIS — D649 Anemia, unspecified: Secondary | ICD-10-CM | POA: Insufficient documentation

## 2023-12-12 DIAGNOSIS — E785 Hyperlipidemia, unspecified: Secondary | ICD-10-CM | POA: Insufficient documentation

## 2023-12-12 DIAGNOSIS — M8588 Other specified disorders of bone density and structure, other site: Secondary | ICD-10-CM | POA: Insufficient documentation

## 2023-12-12 DIAGNOSIS — G2581 Restless legs syndrome: Secondary | ICD-10-CM | POA: Insufficient documentation

## 2023-12-12 DIAGNOSIS — M19042 Primary osteoarthritis, left hand: Secondary | ICD-10-CM | POA: Insufficient documentation

## 2023-12-12 DIAGNOSIS — I83893 Varicose veins of bilateral lower extremities with other complications: Secondary | ICD-10-CM | POA: Insufficient documentation

## 2023-12-12 DIAGNOSIS — M1990 Unspecified osteoarthritis, unspecified site: Secondary | ICD-10-CM | POA: Insufficient documentation

## 2023-12-12 DIAGNOSIS — E559 Vitamin D deficiency, unspecified: Secondary | ICD-10-CM | POA: Insufficient documentation

## 2023-12-12 DIAGNOSIS — K219 Gastro-esophageal reflux disease without esophagitis: Secondary | ICD-10-CM | POA: Insufficient documentation

## 2023-12-12 DIAGNOSIS — R202 Paresthesia of skin: Secondary | ICD-10-CM | POA: Insufficient documentation

## 2023-12-23 ENCOUNTER — Ambulatory Visit: Admitting: Adult Health

## 2023-12-24 ENCOUNTER — Ambulatory Visit (AMBULATORY_SURGERY_CENTER): Admitting: *Deleted

## 2023-12-24 VITALS — Ht 64.0 in | Wt 140.0 lb

## 2023-12-24 DIAGNOSIS — Z860100 Personal history of colon polyps, unspecified: Secondary | ICD-10-CM

## 2023-12-24 DIAGNOSIS — Z85038 Personal history of other malignant neoplasm of large intestine: Secondary | ICD-10-CM

## 2023-12-24 NOTE — Progress Notes (Signed)
 Pt's name and DOB verified at the beginning of the pre-visit wit 2 identifiers  Permission given to speak with  Pt denies any difficulty with ambulating,sitting, laying down or rolling side to side  Pt has no issues moving head neck or swallowing  No egg or soy allergy known to patient   No issues known to pt with past sedation with any surgeries or procedures  Patient denies ever being intubated  No FH of Malignant Hyperthermia  Pt is not on home 02   Pt is not on blood thinners   Pt has frequent issues with constipation RN instructed pt to use Miralax  per bottles instructions a week before prep days. Pt states they will  Pt is not on dialysis  Pt HX OF aFIB  Pt denies any upcoming cardiac testing  Patient's chart reviewed by Rogena Class CNRA prior to pre-visit and patient appropriate for the [facility].  Pre-visit completed and red dot placed by patient's name on their procedure day (on provider's schedule).     Visit in person  Pt states weight is 140 LB  IInstructions reviewed. Pt given  both [facility] main # and MD on call # prior to instructions.  Pt states understanding of instructions. Instructed pt to review instructions again prior to procedure and call main # given if has questions.. Pt states they will.   Instructed pt on where to find instructions on My Chart.

## 2023-12-30 ENCOUNTER — Ambulatory Visit: Admitting: Adult Health

## 2024-01-15 ENCOUNTER — Other Ambulatory Visit: Payer: Self-pay | Admitting: Gastroenterology

## 2024-01-15 ENCOUNTER — Ambulatory Visit: Admitting: Gastroenterology

## 2024-01-15 ENCOUNTER — Encounter: Payer: Self-pay | Admitting: Gastroenterology

## 2024-01-15 VITALS — BP 166/69 | HR 50 | Temp 97.3°F | Resp 14 | Ht 64.0 in | Wt 133.0 lb

## 2024-01-15 DIAGNOSIS — D123 Benign neoplasm of transverse colon: Secondary | ICD-10-CM

## 2024-01-15 DIAGNOSIS — D125 Benign neoplasm of sigmoid colon: Secondary | ICD-10-CM

## 2024-01-15 DIAGNOSIS — K5909 Other constipation: Secondary | ICD-10-CM

## 2024-01-15 DIAGNOSIS — Z1211 Encounter for screening for malignant neoplasm of colon: Secondary | ICD-10-CM | POA: Diagnosis not present

## 2024-01-15 DIAGNOSIS — K634 Enteroptosis: Secondary | ICD-10-CM

## 2024-01-15 DIAGNOSIS — Z860100 Personal history of colon polyps, unspecified: Secondary | ICD-10-CM

## 2024-01-15 DIAGNOSIS — K635 Polyp of colon: Secondary | ICD-10-CM | POA: Diagnosis not present

## 2024-01-15 DIAGNOSIS — K644 Residual hemorrhoidal skin tags: Secondary | ICD-10-CM | POA: Diagnosis not present

## 2024-01-15 DIAGNOSIS — Z85038 Personal history of other malignant neoplasm of large intestine: Secondary | ICD-10-CM

## 2024-01-15 DIAGNOSIS — K5903 Drug induced constipation: Secondary | ICD-10-CM

## 2024-01-15 DIAGNOSIS — I639 Cerebral infarction, unspecified: Secondary | ICD-10-CM

## 2024-01-15 DIAGNOSIS — K648 Other hemorrhoids: Secondary | ICD-10-CM

## 2024-01-15 DIAGNOSIS — I4891 Unspecified atrial fibrillation: Secondary | ICD-10-CM

## 2024-01-15 MED ORDER — SODIUM CHLORIDE 0.9 % IV SOLN: 500.0000 mL | INTRAVENOUS | Status: DC

## 2024-01-15 NOTE — Op Note (Signed)
 [facility] Patient Name: Eileen [name] Procedure Date: 01/15/2024 3:36 PM MRN: [id number] Endoscopist: Sergio Dandy , MD, [id number] Age: 78 Referring MD:  Date of Birth: 04/10/1946 Gender: Female Account #: [id number] Procedure:                Colonoscopy Indications:              High risk colon cancer surveillance: Personal                            history of colon cancer Medicines:                Monitored Anesthesia Care Procedure:                Pre-Anesthesia Assessment:                           - Prior to the procedure, a History and Physical                            was performed, and patient medications and                            allergies were reviewed. The patient's tolerance of                            previous anesthesia was also reviewed. The risks                            and benefits of the procedure and the sedation                            options and risks were discussed with the patient.                            All questions were answered, and informed consent                            was obtained. Prior Anticoagulants: The patient                            last took Eliquis (apixaban) 2 days prior to the                            procedure. ASA Grade Assessment: III - A patient                            with severe systemic disease. After reviewing the                            risks and benefits, the patient was deemed in                            satisfactory condition to undergo the procedure.  After obtaining informed consent, the colonoscope                            was passed under direct vision. Throughout the                            procedure, the patient's blood pressure, pulse, and                            oxygen saturations were monitored continuously. The                            [id number]-[id number] Colonoscope [id number] was introduced                            through the anus and  advanced to the the cecum,                            identified by appendiceal orifice and ileocecal                            valve. The colonoscopy was performed without                            difficulty. The patient tolerated the procedure                            well. The quality of the bowel preparation was                            good. The ileocecal valve, appendiceal orifice, and                            rectum were photographed. Scope In: 3:46:45 PM Scope Out: 4:01:45 PM Scope Withdrawal Time: 0 hours 9 minutes 59 seconds  Total Procedure Duration: 0 hours 15 minutes 0 seconds  Findings:                 The perianal and digital rectal examinations were                            normal.                           Two sessile polyps were found in the sigmoid colon                            and transverse colon. The polyps were 3 to 5 mm in                            size. These polyps were removed with a cold snare.                            Resection and retrieval were complete.  Non-bleeding external and internal hemorrhoids were                            found during retroflexion. The hemorrhoids were                            medium-sized. Complications:            No immediate complications. Estimated Blood Loss:     Estimated blood loss was minimal. Impression:               - Two 3 to 5 mm polyps in the sigmoid colon and in                            the transverse colon, removed with a cold snare.                            Resected and retrieved.                           - Non-bleeding external and internal hemorrhoids. Recommendation:           - Patient has a contact number available for                            emergencies. The signs and symptoms of potential                            delayed complications were discussed with the                            patient. Return to normal activities tomorrow.                             Written discharge instructions were provided to the                            patient.                           - Resume previous diet.                           - Continue present medications.                           - Await pathology results.                           - No repeat colonoscopy due to age.                           - Resume Eliquis (apixaban) at prior dose tomorrow.                            Refer to managing physician for further adjustment  of therapy. Eileen Parke V. Jnai Snellgrove, MD 01/15/2024 4:10:27 PM This report has been signed electronically.

## 2024-01-15 NOTE — Progress Notes (Unsigned)
 Sedate, gd SR, tolerated procedure well, VSS, report to RN

## 2024-01-15 NOTE — Progress Notes (Unsigned)
 Called to room to assist during endoscopic procedure.  Patient ID and intended procedure confirmed with present staff. Received instructions for my participation in the procedure from the performing physician.

## 2024-01-15 NOTE — Progress Notes (Unsigned)
 [facility] History and Physical   Primary Care Physician:  Roselind Congo, MD   Reason for Procedure:  History of colon cancer  Plan:    Surveillance colonoscopy with possible interventions as needed     HPI: Eileen [name] is a very pleasant 78 y.o. female here for surveillance colonoscopy. Denies any nausea, vomiting, abdominal pain, melena or bright red blood per rectum  The risks and benefits as well as alternatives of endoscopic procedure(s) have been discussed and reviewed. All questions answered. The patient agrees to proceed.    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   Headache(784.0)    HEMORRHOIDS    Hypertension    Malignant neoplasm of colon (HCC) 01/26/2005   Qualifier: History of   By: Tonita Frater, Pam       Personal history of chemotherapy    Personal history of radiation therapy    Stroke (HCC) 03/30/2022   Thyroid  disease     Past Surgical History:  Procedure Laterality Date   BIOPSY  08/10/2019   Procedure: BIOPSY;  Surgeon: Sergio Dandy, MD;  Location: [facility];  Service: Endoscopy;;   BONE MARROW TRANSPLANT  1999   BREAST LUMPECTOMY Left 11/95   left breast   BUNIONECTOMY  3/09, 10/09   right and left   COLON SURGERY     COLONOSCOPY     COLONOSCOPY WITH PROPOFOL  [name] 08/10/2019   Procedure: COLONOSCOPY WITH PROPOFOL ;  Surgeon: Sergio Dandy, MD;  Location: [facility];  Service: Endoscopy;  Laterality: [name];   ERCP  01/21/2012   Procedure: ENDOSCOPIC RETROGRADE CHOLANGIOPANCREATOGRAPHY (ERCP);  Surgeon: Pietro Bridegroom, MD;  Location: [facility];  Service: Endoscopy;  Laterality: [name];   hemicolectomy  01/2005   right   HYSTEROSCOPY   08/30/94   INGUINAL HERNIA REPAIR     LEFT HEART CATH AND CORONARY ANGIOGRAPHY [name] 05/21/2018   Procedure: LEFT HEART CATH AND CORONARY ANGIOGRAPHY;  Surgeon: Sammy Crisp, MD;  Location: [facility];  Service: Cardiovascular;  Laterality: [name];   NECK MASS EXCISION Left 7/99   ROTATOR CUFF REPAIR Right 09/08/13   full tear   TOTAL ABDOMINAL HYSTERECTOMY W/ BILATERAL SALPINGOOPHORECTOMY  1996   TUNNELED VENOUS CATHETER PLACEMENT  9/98    Prior to Admission medications   Medication Sig Start Date End Date Taking? Authorizing Provider  acetaminophen  (TYLENOL ) 500 MG tablet Take 500 mg by mouth every 6 (six) hours as needed for moderate pain or headache.   Yes [provider]  amiodarone  (PACERONE ) 200 MG tablet Take 200 mg by mouth. Takes Mon-Fri, not on weekends   Yes [provider]  apixaban (ELIQUIS) 5 MG TABS tablet Take 5 mg by mouth 2 (two) times daily.   Yes [provider]  aspirin  EC 81 MG tablet 1 tablet Orally Once a day   Yes [provider]  atorvastatin (LIPITOR) 80 MG tablet Take 80 mg by mouth daily.   Yes [provider]  calcium carbonate (SUPER CALCIUM) 1500 (600 Ca) MG TABS tablet Take 600 mg by mouth.   Yes [provider]  carvedilol  (COREG ) 6.25 MG tablet  Take 6.25 mg by mouth. 08/07/22  Yes [provider]  chlorthalidone (HYGROTON) 25 MG tablet Take 12.5 mg by mouth daily.   Yes [provider]  Cholecalciferol 50 MCG (2000 UT) TABS Take 2,000 Units by mouth.   Yes [provider]  levothyroxine (SYNTHROID) 88 MCG tablet TAKE ONE TABLET BY MOUTH EVERY MORNING ON AN EMPTY STOMACH for 90   Yes [provider]  losartan  (COZAAR ) 50 MG tablet Take 50 mg by mouth daily. 05/15/22  Yes [provider]  magnesium oxide (MAG-OX) 400 MG tablet Take by mouth.   Yes [provider]  Multiple Vitamin (MULTIVITAMIN) tablet Take 1 tablet by mouth daily.   Yes [provider]  potassium chloride  (KLOR-CON  M) 10 MEQ tablet Take 20 mEq by mouth. 10/31/23  Yes [provider]  spironolactone (ALDACTONE) 25 MG tablet Take 25 mg by mouth daily. 01/14/24  Yes [provider]  vitamin B-12 (CYANOCOBALAMIN ) 500 MCG tablet Take 500 mcg by mouth See admin instructions. Take 500 mcg 5 days weekly, takes on non b complex days   Yes [provider]  vitamin C (ASCORBIC ACID) 500 MG tablet Take 500 mg by mouth 2 (two) times daily.    Yes [provider]  Vitamin D , Ergocalciferol , (DRISDOL ) 50000 UNITS CAPS Take 50,000 Units by mouth every Wednesday.  08/28/11  Yes [provider]  vitamin E 400 UNIT capsule Take 400 Units by mouth daily.   Yes [provider]  SUFLAVE  178.7 g SOLR Take 1 Dose by mouth. Patient not taking: Reported on 01/15/2024 10/16/23   [provider]  Vitamin D , Ergocalciferol , 50 MCG (2000 UT) CAPS Take by mouth. Daily except on Wednesday    [provider]    Current Outpatient Medications  Medication Sig Dispense Refill   acetaminophen  (TYLENOL ) 500 MG tablet Take 500 mg by mouth every 6 (six) hours as needed for moderate pain or headache.     amiodarone  (PACERONE ) 200 MG tablet Take 200 mg by mouth. Takes Mon-Fri, not on weekends     apixaban (ELIQUIS) 5 MG TABS tablet Take 5 mg by mouth 2 (two) times daily.     aspirin  EC 81 MG tablet 1 tablet Orally Once a day     atorvastatin (LIPITOR) 80 MG tablet Take 80 mg by mouth daily.     calcium carbonate (SUPER CALCIUM) 1500 (600 Ca) MG TABS tablet Take 600 mg by mouth.     carvedilol  (COREG ) 6.25 MG tablet Take 6.25 mg by mouth.     chlorthalidone (HYGROTON) 25 MG tablet Take 12.5 mg by mouth daily.     Cholecalciferol 50 MCG (2000 UT) TABS Take 2,000 Units by mouth.     levothyroxine (SYNTHROID) 88 MCG tablet TAKE ONE TABLET BY MOUTH EVERY MORNING ON AN EMPTY STOMACH for 90     losartan  (COZAAR ) 50 MG tablet Take 50 mg by  mouth daily.     magnesium oxide (MAG-OX) 400 MG tablet Take by mouth.     Multiple Vitamin (MULTIVITAMIN) tablet Take 1 tablet by mouth daily.     potassium chloride  (KLOR-CON  M) 10 MEQ tablet Take 20 mEq by mouth.     spironolactone (ALDACTONE) 25 MG tablet Take 25 mg by mouth daily.     vitamin B-12 (CYANOCOBALAMIN ) 500 MCG tablet Take 500 mcg by mouth See admin instructions. Take 500 mcg 5 days weekly, takes on non b complex days     vitamin C (ASCORBIC  ACID) 500 MG tablet Take 500 mg by mouth 2 (two) times daily.      Vitamin D , Ergocalciferol , (DRISDOL ) 50000 UNITS CAPS Take 50,000 Units by mouth every Wednesday.      vitamin E 400 UNIT capsule Take 400 Units by mouth daily.     SUFLAVE  178.7 g SOLR Take 1 Dose by mouth. (Patient not taking: Reported on 01/15/2024)     Vitamin D , Ergocalciferol , 50 MCG (2000 UT) CAPS Take by mouth. Daily except on Wednesday     Current Facility-Administered Medications  Medication Dose Route Frequency Provider Last Rate Last Admin   0.9 %  sodium chloride  infusion  500 mL Intravenous Continuous Mykel Mohl V, MD        Allergies as of 01/15/2024 - Review Complete 01/15/2024  Allergen Reaction Noted   Sacubitril-valsartan Anaphylaxis, Diarrhea, and Nausea Only 01/01/2021   Carvedilol  Other (See Comments) 05/05/2020   Levofloxacin  Other (See Comments) 11/05/2011   Methocarbamol Rash 05/05/2020   Tape  08/27/2013   Tramadol Other (See Comments) 03/25/2022    Family History  Problem Relation Age of Onset   Uterine cancer Mother    Heart attack Father    Colon polyps Son    Colon cancer Neg Hx    Esophageal cancer Neg Hx    Stomach cancer Neg Hx    Rectal cancer Neg Hx     Social History   Socioeconomic History   Marital status: Married    Spouse name: Not on file   Number of children: 2   Years of education: Not on file   Highest education level: Not on file  Occupational History   Not on file  Tobacco Use   Smoking status:  Former    Current packs/day: 0.00    Types: Cigarettes    Quit date: 12/20/1990    Years since quitting: 33.0   Smokeless tobacco: Never  Vaping Use   Vaping status: Never Used  Substance and Sexual Activity   Alcohol use: Not Currently   Drug use: Never   Sexual activity: Not Currently    Birth control/protection: Surgical    Comment: hysterectomy  Other Topics Concern   Not on file  Social History Narrative   ** Merged History Encounter **       Social Drivers of Health   Financial Resource Strain: Low Risk  (08/15/2022)   Received from [facility]   Overall Financial Resource Strain (CARDIA)    Difficulty of Paying Living Expenses: Not hard at all  Food Insecurity: No Food Insecurity (12/07/2023)   Received from [facility]   Hunger Vital Sign    Within the past 12 months, you worried that your food would run out before you got the money to buy more.: Never true    Within the past 12 months, the food you bought just didn't last and you didn't have money to get more.: Never true  Transportation Needs: No Transportation Needs (12/07/2023)   Received from [facility] - Transportation    Lack of Transportation (Medical): No    Lack of Transportation (Non-Medical): No  Physical Activity: Insufficiently Active (10/10/2022)   Received from [facility]   Exercise Vital Sign    On average, how many days per week do you engage in moderate to strenuous exercise (like a brisk walk)?: 3 days    On average, how many minutes do you engage in exercise at this level?: 20 min  Stress: No Stress Concern Present (  12/07/2023)   Received from [facility] - Occupational Stress Questionnaire    Feeling of Stress : Not at all  Social Connections: Unknown (12/07/2021)   Received from [facility]   Social Network    Social Network: Not on file  Intimate Partner Violence: Not At Risk (12/07/2023)   Received from [facility]   HITS     Over the last 12 months how often did your partner physically hurt you?: Never    Over the last 12 months how often did your partner insult you or talk down to you?: Never    Over the last 12 months how often did your partner threaten you with physical harm?: Never    Over the last 12 months how often did your partner scream or curse at you?: Never    Review of Systems:  All other review of systems negative except as mentioned in the HPI.  Physical Exam: Vital signs in last 24 hours: BP (!) 195/96   Pulse 60   Temp (!) 97.3 F (36.3 C)   Ht 5' 4 (1.626 m)   Wt 133 lb (60.3 kg)   LMP 07/30/1995   SpO2 100%   BMI 22.83 kg/m  General:   Alert, NAD Lungs:  Clear .   Heart:  Regular rate and rhythm Abdomen:  Soft, nontender and nondistended. Neuro/Psych:  Alert and cooperative. Normal mood and affect. A and O x 3  Reviewed labs, radiology imaging, old records and pertinent past GI work up  Patient is appropriate for planned procedure(s) and anesthesia in an ambulatory setting   K. Veena Shery [place]un[place]a , MD [phone]

## 2024-01-15 NOTE — Patient Instructions (Signed)
 Resume previous diet Continue present medications Await pathology results No repeat colonoscopy due to age Resume Eliquis at prior dose tomorrow    YOU HAD AN ENDOSCOPIC PROCEDURE TODAY AT THE [facility]:   Refer to the procedure report that was given to you for any specific questions about what was found during the examination.  If the procedure report does not answer your questions, please call your gastroenterologist to clarify.  If you requested that your care partner not be given the details of your procedure findings, then the procedure report has been included in a sealed envelope for you to review at your convenience later.  YOU SHOULD EXPECT: Some feelings of bloating in the abdomen. Passage of more gas than usual.  Walking can help get rid of the air that was put into your GI tract during the procedure and reduce the bloating. If you had a lower endoscopy (such as a colonoscopy or flexible sigmoidoscopy) you may notice spotting of blood in your stool or on the toilet paper. If you underwent a bowel prep for your procedure, you may not have a normal bowel movement for a few days.  Please Note:  You might notice some irritation and congestion in your nose or some drainage.  This is from the oxygen used during your procedure.  There is no need for concern and it should clear up in a day or so.  SYMPTOMS TO REPORT IMMEDIATELY:  Following lower endoscopy (colonoscopy or flexible sigmoidoscopy):  Excessive amounts of blood in the stool  Significant tenderness or worsening of abdominal pains  Swelling of the abdomen that is new, acute  Fever of 100F or higher   For urgent or emergent issues, a gastroenterologist can be reached at any hour by calling [phone]. Do not use MyChart messaging for urgent concerns.    DIET:  We do recommend a small meal at first, but then you may proceed to your regular diet.  Drink plenty of fluids but you should avoid alcoholic beverages  for 24 hours.  ACTIVITY:  You should plan to take it easy for the rest of today and you should NOT DRIVE or use heavy machinery until tomorrow (because of the sedation medicines used during the test).    FOLLOW UP: Our staff will call the number listed on your records the next business day following your procedure.  We will call around 7:15- 8:00 am to check on you and address any questions or concerns that you may have regarding the information given to you following your procedure. If we do not reach you, we will leave a message.     If any biopsies were taken you will be contacted by phone or by letter within the next 1-3 weeks.  Please call us  at [phone] if you have not heard about the biopsies in 3 weeks.    SIGNATURES/CONFIDENTIALITY: You and/or your care partner have signed paperwork which will be entered into your electronic medical record.  These signatures attest to the fact that that the information above on your After Visit Summary has been reviewed and is understood.  Full responsibility of the confidentiality of this discharge information lies with you and/or your care-partner.

## 2024-01-16 ENCOUNTER — Telehealth: Payer: Self-pay

## 2024-01-16 NOTE — Telephone Encounter (Signed)
  Follow up Call-     01/15/2024    3:04 PM  Call back number  Post procedure Call Back phone  # [phone]  Permission to leave phone message Yes     Patient questions:  Do you have a fever, pain , or abdominal swelling? No. Pain Score  0 *  Have you tolerated food without any problems? Yes.    Have you been able to return to your normal activities? Yes.    Do you have any questions about your discharge instructions: Diet   No. Medications  No. Follow up visit  No.  Do you have questions or concerns about your Care? No.  Actions: * If pain score is 4 or above: No action needed, pain <4.

## 2024-01-19 ENCOUNTER — Telehealth: Payer: Self-pay | Admitting: *Deleted

## 2024-01-19 NOTE — Telephone Encounter (Signed)
 Ms. Jinkins LVM. She said she'd received a [facility]. She stated she has another appt with at same time and needs to change this appt.  Contacted patient at number provided (Home number). No answer and no voice mail. Schedule message sent with request to contact patient to reschedule appt.

## 2024-01-20 LAB — SURGICAL PATHOLOGY

## 2024-01-26 ENCOUNTER — Inpatient Hospital Stay: Admitting: Adult Health

## 2024-02-02 ENCOUNTER — Inpatient Hospital Stay: Attending: Adult Health | Admitting: Adult Health

## 2024-02-02 VITALS — BP 155/69 | HR 56 | Temp 98.3°F | Resp 16 | Wt 133.6 lb

## 2024-02-02 DIAGNOSIS — K219 Gastro-esophageal reflux disease without esophagitis: Secondary | ICD-10-CM | POA: Diagnosis not present

## 2024-02-02 DIAGNOSIS — D649 Anemia, unspecified: Secondary | ICD-10-CM | POA: Insufficient documentation

## 2024-02-02 DIAGNOSIS — I5022 Chronic systolic (congestive) heart failure: Secondary | ICD-10-CM | POA: Diagnosis not present

## 2024-02-02 DIAGNOSIS — R978 Other abnormal tumor markers: Secondary | ICD-10-CM | POA: Insufficient documentation

## 2024-02-02 DIAGNOSIS — I10 Essential (primary) hypertension: Secondary | ICD-10-CM | POA: Insufficient documentation

## 2024-02-02 DIAGNOSIS — G2581 Restless legs syndrome: Secondary | ICD-10-CM | POA: Diagnosis not present

## 2024-02-02 DIAGNOSIS — E7800 Pure hypercholesterolemia, unspecified: Secondary | ICD-10-CM | POA: Insufficient documentation

## 2024-02-02 DIAGNOSIS — Z923 Personal history of irradiation: Secondary | ICD-10-CM | POA: Insufficient documentation

## 2024-02-02 DIAGNOSIS — E039 Hypothyroidism, unspecified: Secondary | ICD-10-CM | POA: Insufficient documentation

## 2024-02-02 DIAGNOSIS — Z860100 Personal history of colon polyps, unspecified: Secondary | ICD-10-CM | POA: Insufficient documentation

## 2024-02-02 DIAGNOSIS — I480 Paroxysmal atrial fibrillation: Secondary | ICD-10-CM | POA: Diagnosis not present

## 2024-02-02 DIAGNOSIS — L03319 Cellulitis of trunk, unspecified: Secondary | ICD-10-CM | POA: Diagnosis not present

## 2024-02-02 DIAGNOSIS — E785 Hyperlipidemia, unspecified: Secondary | ICD-10-CM | POA: Insufficient documentation

## 2024-02-02 DIAGNOSIS — I110 Hypertensive heart disease with heart failure: Secondary | ICD-10-CM | POA: Diagnosis not present

## 2024-02-02 DIAGNOSIS — G4700 Insomnia, unspecified: Secondary | ICD-10-CM | POA: Insufficient documentation

## 2024-02-02 DIAGNOSIS — M5442 Lumbago with sciatica, left side: Secondary | ICD-10-CM | POA: Insufficient documentation

## 2024-02-02 DIAGNOSIS — Z853 Personal history of malignant neoplasm of breast: Secondary | ICD-10-CM | POA: Diagnosis not present

## 2024-02-02 DIAGNOSIS — Z9221 Personal history of antineoplastic chemotherapy: Secondary | ICD-10-CM | POA: Diagnosis not present

## 2024-02-02 DIAGNOSIS — Z87891 Personal history of nicotine dependence: Secondary | ICD-10-CM | POA: Diagnosis not present

## 2024-02-02 DIAGNOSIS — M8580 Other specified disorders of bone density and structure, unspecified site: Secondary | ICD-10-CM | POA: Diagnosis not present

## 2024-02-02 DIAGNOSIS — Z9484 Stem cells transplant status: Secondary | ICD-10-CM | POA: Insufficient documentation

## 2024-02-02 DIAGNOSIS — G8929 Other chronic pain: Secondary | ICD-10-CM | POA: Diagnosis not present

## 2024-02-02 DIAGNOSIS — Z8673 Personal history of transient ischemic attack (TIA), and cerebral infarction without residual deficits: Secondary | ICD-10-CM | POA: Insufficient documentation

## 2024-02-02 DIAGNOSIS — Z85038 Personal history of other malignant neoplasm of large intestine: Secondary | ICD-10-CM | POA: Diagnosis not present

## 2024-02-02 DIAGNOSIS — E559 Vitamin D deficiency, unspecified: Secondary | ICD-10-CM | POA: Diagnosis not present

## 2024-02-02 DIAGNOSIS — C189 Malignant neoplasm of colon, unspecified: Secondary | ICD-10-CM

## 2024-02-02 NOTE — Progress Notes (Signed)
 [facility] Cancer Follow up:    Arloa Elsie SAUNDERS, MD [phone] [address] [place] [address]   DIAGNOSIS: Cancer Staging  History of breast cancer Staging form: Breast, AJCC 7th Edition - Clinical: Stage Unknown (TX, NX, cM0) - Signed by Amadeo Windell SAILOR, MD on 03/23/2014    SUMMARY OF ONCOLOGIC HISTORY: 1. She is status post lumpectomy and lymph node dissection followed by [facility] and radiation therapy. 2. She is status post 4 cycles of Adriamycin and Taxotere followed by stem cell transplantation at [facility] on 08/16/1997 due to recurrence and the left supraclavicular lymph node. 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 3. She is status post right colectomy for stage IIA adenocarcinoma of the colon in October of 2006. She is status post 10 cycles of FOLFOX concluded in 06/2005 4. Femara  taken between 2001 and 2017.  CURRENT THERAPY: observation  INTERVAL HISTORY:  Discussed the use of AI scribe software for clinical note transcription with the patient, who gave verbal consent to proceed.  History of Present Illness Eileen [name] is a 78 year old female with a history of breast and colon cancer who presents for follow-up after elevated tumor markers.  Earlier this year, elevated CA 27-29 and CEA levels were noted, leading to further investigation. A colonoscopy on January 15, 2024, revealed two polyps in the sigmoid colon, which were removed. Imaging, including a CT scan of the chest, abdomen, and pelvis, and a bone scan, showed no evidence of metastasis.  She reports no new symptoms or significant changes in her health since the last visit. She continues to monitor her breast health regularly.      Patient Active Problem List   Diagnosis Date Noted   Absolute anemia 12/12/2023   Cellulitis and abscess of trunk 12/12/2023   Gastro-esophageal reflux disease without esophagitis 12/12/2023   Hyperlipidemia 12/12/2023   Lumbago with sciatica, left side 12/12/2023    Osteoarthritis 12/12/2023   Osteochondropathy 12/12/2023   Osteopenia of spine 12/12/2023   Other chronic pain 12/12/2023   Primary insomnia 12/12/2023   Paresthesia 12/12/2023   Primary osteoarthritis of left hand 12/12/2023   Pure hypercholesterolemia 12/12/2023   RLS (restless legs syndrome) 12/12/2023   Sciatica of left side 12/12/2023   Stem cells transplant status (HCC) 12/12/2023   Varicose veins of bilateral lower extremities with other complications 12/12/2023   Vitamin D  deficiency 12/12/2023   Cancer of breast (HCC) 12/12/2023   Campylobacter diarrhea 10/30/2023   Weakness 10/29/2023   Hypokalemia 10/28/2023   Myopia with presbyopia, bilateral 05/06/2023   Status post left cataract extraction 01/14/2023   Dysautonomia-like disorder 01/01/2023   Labile blood pressure 01/01/2023   Venous insufficiency 01/01/2023   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   Status post right cataract extraction 11/18/2022   Unspecified atrial fibrillation (HCC) 10/01/2022   Combined forms of age-related cataract of both eyes 09/29/2022   TIA due to embolism (HCC) 08/08/2022   Acquired hypothyroidism 08/04/2022   History of stroke 08/04/2022   Paroxysmal A-fib (HCC) 08/04/2022   Anxiety 07/17/2022   Cerebral infarction, unspecified (HCC) 04/02/2022   Acute cerebrovascular accident (CVA) (HCC) 04/02/2022   Persistent atrial fibrillation (HCC) 12/14/2020   Nonischemic cardiomyopathy (HCC) 12/29/2019   Polyp of descending colon    Chronic systolic heart failure (HCC) 05/21/2018   Cardiomyopathy (HCC)    Hypertension 11/06/2017   Primary hypertension 11/06/2017   Tinnitus of both ears 03/14/2017   TMJ dysfunction 03/14/2017   Complete rotator  cuff rupture of left shoulder 09/21/2014   Abdominal pain, epigastric 08/24/2013   Belching 08/24/2013   Other specified abnormal findings of blood chemistry 01/21/2012   Nonspecific (abnormal) findings on  radiological and other examination of biliary tract 01/21/2012   TIA (transient ischemic attack) 11/05/2011   PVC's (premature ventricular contractions) 10/11/2011   Nonspecific abnormal results of cardiovascular function study 12/20/2010   History of breast cancer 08/23/2008   HEMORRHOIDS 08/23/2008   CONSTIPATION, CHRONIC 08/23/2008   RECTAL BLEEDING 08/23/2008   History of colon cancer 08/23/2008   Malignant neoplasm of female breast (HCC) 08/23/2008   Malignant neoplasm of colon (HCC) 01/26/2005    is allergic to sacubitril-valsartan, carvedilol , levofloxacin , methocarbamol, tape, and tramadol.  MEDICAL HISTORY: 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   Headache(784.0)    HEMORRHOIDS    Hypertension    Malignant neoplasm of colon (HCC) 01/26/2005   Qualifier: History of   By: Drucilla BANANA, Pam       Personal history of chemotherapy    Personal history of radiation therapy    Stroke (HCC) 03/30/2022   Thyroid  disease     SURGICAL HISTORY: Past Surgical History:  Procedure Laterality Date   BIOPSY  08/10/2019   Procedure: BIOPSY;  Surgeon: Shila Gustav GAILS, MD;  Location: [facility];  Service: Endoscopy;;   BONE MARROW TRANSPLANT  1999   BREAST LUMPECTOMY Left 11/95   left breast   BUNIONECTOMY  3/09, 10/09   right and left   COLON SURGERY     COLONOSCOPY     COLONOSCOPY WITH PROPOFOL  [name] 08/10/2019   Procedure: COLONOSCOPY WITH PROPOFOL ;  Surgeon: Shila Gustav GAILS, MD;  Location: [facility];  Service: Endoscopy;  Laterality: [name];   ERCP  01/21/2012   Procedure: ENDOSCOPIC RETROGRADE CHOLANGIOPANCREATOGRAPHY (ERCP);  Surgeon: Princella CHRISTELLA Nida, MD;  Location: [facility];  Service:  Endoscopy;  Laterality: [name];   hemicolectomy  01/2005   right   HYSTEROSCOPY  08/30/94   INGUINAL HERNIA REPAIR     LEFT HEART CATH AND CORONARY ANGIOGRAPHY [name] 05/21/2018   Procedure: LEFT HEART CATH AND CORONARY ANGIOGRAPHY;  Surgeon: Mady Bruckner, MD;  [facility];  Service: Cardiovascular;  Laterality: [name];   NECK MASS EXCISION Left 7/99   ROTATOR CUFF REPAIR Right 09/08/13   full tear   TOTAL ABDOMINAL HYSTERECTOMY W/ BILATERAL SALPINGOOPHORECTOMY  1996   TUNNELED VENOUS CATHETER PLACEMENT  9/98    SOCIAL HISTORY: Social History   Socioeconomic History   Marital status: Married    Spouse name: Not on file   Number of children: 2   Years of education: Not on file   Highest education level: Not on file  Occupational History   Not on file  Tobacco Use   Smoking status: Former    Current packs/day: 0.00    Types: Cigarettes    Quit date: 12/20/1990    Years since quitting: 33.1   Smokeless tobacco: Never  Vaping Use   Vaping status: Never Used  Substance and Sexual Activity   Alcohol use: Not Currently   Drug use: Never   Sexual activity: Not Currently  Birth control/protection: Surgical    Comment: hysterectomy  Other Topics Concern   Not on file  Social History Narrative   ** Merged History Encounter **       Social Drivers of Health   Financial Resource Strain: Low Risk  (08/15/2022)   Received from [facility]   Overall Financial Resource Strain (CARDIA)    Difficulty of Paying Living Expenses: Not hard at all  Food Insecurity: No Food Insecurity (12/07/2023)   Received from [facility]   Hunger Vital Sign    Within the past 12 months, you worried that your food would run out before you got the money to buy more.: Never true    Within the past 12 months, the food you bought just didn't last and you didn't have money to get more.: Never true  Transportation Needs: No Transportation Needs (12/07/2023)   Received from [facility] - Transportation    Lack of Transportation (Medical): No    Lack of Transportation (Non-Medical): No  Physical Activity: Insufficiently Active (10/10/2022)   Received from [facility]   Exercise Vital Sign    On average, how many days per week do you engage in moderate to strenuous exercise (like a brisk walk)?: 3 days    On average, how many minutes do you engage in exercise at this level?: 20 min  Stress: No Stress Concern Present (12/07/2023)   Received from [facility] - Occupational Stress Questionnaire    Feeling of Stress : Not at all  Social Connections: Unknown (12/07/2021)   Received [facility]   Social Network    Social Network: Not on file  Intimate Partner Violence: Not At Risk (12/07/2023)   Received from [facility]   HITS    Over the last 12 months how often did your partner physically hurt you?: Never    Over the last 12 months how often did your partner insult you or talk down to you?: Never    Over the last 12 months how often did your partner threaten you with physical harm?: Never    Over the last 12 months how often did your partner scream or curse at you?: Never    FAMILY HISTORY: Family History  Problem Relation Age of Onset   Uterine cancer Mother    Heart attack Father    Colon polyps Son    Colon cancer Neg Hx    Esophageal cancer Neg Hx    Stomach cancer Neg Hx    Rectal cancer Neg Hx     Review of Systems  Constitutional:  Negative for appetite change, chills, fatigue, fever and unexpected weight change.  HENT:   Negative for hearing loss, lump/mass and trouble swallowing.   Eyes:  Negative for eye problems and icterus.  Respiratory:  Negative for chest tightness, cough and shortness of breath.   Cardiovascular:  Negative for chest pain, leg swelling and palpitations.  Gastrointestinal:  Negative for abdominal distention, abdominal pain, constipation, diarrhea, nausea and vomiting.   Endocrine: Negative for hot flashes.  Genitourinary:  Negative for difficulty urinating.   Musculoskeletal:  Negative for arthralgias.  Skin:  Negative for itching and rash.  Neurological:  Negative for dizziness, extremity weakness, headaches and numbness.  Hematological:  Negative for adenopathy. Does not bruise/bleed easily.  Psychiatric/Behavioral:  Negative for depression. The patient is not nervous/anxious.       PHYSICAL EXAMINATION    Vitals:   02/02/24  1332 02/02/24 1333  BP: (!) 185/79 (!) 155/69  Pulse: (!) 56   Resp: 16   Temp: 98.3 F (36.8 C)   SpO2: 100%     Physical Exam Constitutional:      General: She is not in acute distress.    Appearance: Normal appearance. She is not toxic-appearing.  HENT:     Head: Normocephalic and atraumatic.  Eyes:     General: No scleral icterus. Cardiovascular:     Rate and Rhythm: Normal rate and regular rhythm.  Musculoskeletal:        General: No swelling.  Skin:    General: Skin is warm and dry.     Findings: No rash.  Neurological:     General: No focal deficit present.     Mental Status: She is alert.  Psychiatric:        Mood and Affect: Mood normal.        Behavior: Behavior normal.     LABORATORY DATA:  CBC    Component Value Date/Time   WBC 9.5 10/27/2023 1223   WBC 8.9 08/29/2019 0851   RBC 4.35 10/27/2023 1223   HGB 12.7 10/27/2023 1223   HGB 13.3 05/19/2018 1153   HGB 12.9 03/12/2017 1116   HCT 39.0 10/27/2023 1223   HCT 41.3 05/19/2018 1153   HCT 39.2 03/12/2017 1116   PLT 212 10/27/2023 1223   PLT 240 05/19/2018 1153   MCV 89.7 10/27/2023 1223   MCV 87 05/19/2018 1153   MCV 86.6 03/12/2017 1116   MCH 29.2 10/27/2023 1223   MCHC 32.6 10/27/2023 1223   RDW 14.0 10/27/2023 1223   RDW 12.2 (L) 05/19/2018 1153   RDW 13.3 03/12/2017 1116   LYMPHSABS 1.1 10/27/2023 1223   LYMPHSABS 1.6 03/12/2017 1116   MONOABS 0.8 10/27/2023 1223   MONOABS 0.5 03/12/2017 1116   EOSABS 0.0 10/27/2023  1223   EOSABS 0.1 03/12/2017 1116   BASOSABS 0.0 10/27/2023 1223   BASOSABS 0.0 03/12/2017 1116    CMP     Component Value Date/Time   NA 137 10/27/2023 1223   NA 142 03/31/2019 1320   NA 142 03/12/2017 1116   K 3.3 (L) 10/27/2023 1223   K 4.2 03/12/2017 1116   CL 98 10/27/2023 1223   CL 100 03/18/2012 1422   CO2 32 10/27/2023 1223   CO2 25 03/12/2017 1116   GLUCOSE 95 10/27/2023 1223   GLUCOSE 87 03/12/2017 1116   GLUCOSE 92 03/18/2012 1422   BUN 23 10/27/2023 1223   BUN 15 03/31/2019 1320   BUN 18.8 03/12/2017 1116   CREATININE 1.03 (H) 10/27/2023 1223   CREATININE 0.8 03/12/2017 1116   CALCIUM 9.2 10/27/2023 1223   CALCIUM 9.7 03/12/2017 1116   PROT 6.5 10/27/2023 1223   PROT 6.5 03/12/2017 1116   ALBUMIN 4.1 10/27/2023 1223   ALBUMIN 3.6 03/12/2017 1116   AST 21 10/27/2023 1223   AST 27 03/12/2017 1116   ALT 21 10/27/2023 1223   ALT 32 03/12/2017 1116   ALKPHOS 107 10/27/2023 1223   ALKPHOS 71 03/12/2017 1116   BILITOT 0.8 10/27/2023 1223   BILITOT 0.64 03/12/2017 1116   GFRNONAA 56 (L) 10/27/2023 1223   GFRAA >60 10/26/2019 1145     ASSESSMENT and THERAPY PLAN:   Assessment and Plan Assessment & Plan Estrogen-positive breast cancer Treated with Femara . Imaging and tumor markers negative for recurrence. Discussed potential for recurrence, which decreases over time. Explained Guardant Reveal test for early detection of recurrence  by detecting circulating tumor DNA. - Consider Guardant Reveal test for early detection of recurrence-declines at this time  Colon cancer Recent colonoscopy showed two small polyps in the sigmoid colon, removed with no adenomas or concerning findings. Imaging and tumor markers negative for recurrence. Discussed Guardant Reveal test for early detection of recurrence by detecting circulating tumor DNA. - Consider Guardant Reveal test for early detection of recurrence-declines at this time  Hypertension Chronic hypertension with  consistently elevated blood pressure readings. - Continue current medication regimen taken in the morning and evening. - continue f/u with PCP for HTN management.  Stroke Residual balance and vision issues. No new symptoms reported.   RTC in 10/2024 as scheduled for f/u and continued surveillance.    All questions were answered. The patient knows to call the clinic with any problems, questions or concerns. We can certainly see the patient much sooner if necessary.  Total encounter time:20 minutes*in face-to-face visit time, chart review, lab review, care coordination, order entry, and documentation of the encounter time.    Morna Kendall, NP 02/02/24 1:41 PM Medical Oncology and Hematology [facility] [address] [place], [place] [address] Tel. [phone]    Fax. [phone]  *Total Encounter Time as defined by the Centers for Medicare and Medicaid Services includes, in addition to the face-to-face time of a patient visit (documented in the note above) non-face-to-face time: obtaining and reviewing outside history, ordering and reviewing medications, tests or procedures, care coordination (communications with other health care professionals or caregivers) and documentation in the medical record.

## 2024-02-23 NOTE — Progress Notes (Shared)
 [facility] - Clinic Note  03/03/2024   CHIEF COMPLAINT Patient presents for No chief complaint on file.  HISTORY OF PRESENT ILLNESS: Eileen [name] is a 78 y.o. female who presents to the clinic today for:   Pt states  Referring physician: Arloa Elsie SAUNDERS, MD [phone] [address] [place],  [place] [address]  HISTORICAL INFORMATION:  Selected notes from the MEDICAL RECORD NUMBER Referred by Dr. Lamar Gaudy for ARMD eval LEE:  Ocular Hx- h/o CVA w/ visual field defect PMH-   CURRENT MEDICATIONS: No current outpatient medications on file. (Ophthalmic Drugs)   No current facility-administered medications for this visit. (Ophthalmic Drugs)   Current Outpatient Medications (Other)  Medication Sig   acetaminophen  (TYLENOL ) 500 MG tablet Take 500 mg by mouth every 6 (six) hours as needed for moderate pain or headache.   amiodarone  (PACERONE ) 200 MG tablet Take 200 mg by mouth. Takes Mon-Fri, not on weekends   apixaban (ELIQUIS) 5 MG TABS tablet Take 5 mg by mouth 2 (two) times daily.   aspirin  EC 81 MG tablet 1 tablet Orally Once a day   atorvastatin (LIPITOR) 80 MG tablet Take 80 mg by mouth daily.   calcium carbonate (SUPER CALCIUM) 1500 (600 Ca) MG TABS tablet Take 600 mg by mouth.   carvedilol  (COREG ) 6.25 MG tablet Take 6.25 mg by mouth.   chlorthalidone (HYGROTON) 25 MG tablet Take 12.5 mg by mouth daily.   Cholecalciferol 50 MCG (2000 UT) TABS Take 2,000 Units by mouth.   levothyroxine (SYNTHROID) 88 MCG tablet TAKE ONE TABLET BY MOUTH EVERY MORNING ON AN EMPTY STOMACH for 90   losartan  (COZAAR ) 50 MG tablet Take 50 mg by mouth daily.   magnesium oxide (MAG-OX) 400 MG tablet Take by mouth.   Multiple Vitamin (MULTIVITAMIN) tablet Take 1 tablet by mouth daily.   potassium chloride  (KLOR-CON  M) 10 MEQ tablet Take 20 mEq by mouth. (Patient not taking: Reported on 02/02/2024)   spironolactone (ALDACTONE) 25 MG tablet Take 25 mg by mouth daily.   SUFLAVE   178.7 g SOLR Take 1 Dose by mouth. (Patient not taking: Reported on 02/02/2024)   vitamin B-12 (CYANOCOBALAMIN ) 500 MCG tablet Take 500 mcg by mouth See admin instructions. Take 500 mcg 5 days weekly, takes on non b complex days   vitamin C (ASCORBIC ACID) 500 MG tablet Take 500 mg by mouth 2 (two) times daily.    Vitamin D , Ergocalciferol , (DRISDOL ) 50000 UNITS CAPS Take 50,000 Units by mouth every Wednesday.    Vitamin D , Ergocalciferol , 50 MCG (2000 UT) CAPS Take by mouth. Daily except on Wednesday   vitamin E 400 UNIT capsule Take 400 Units by mouth daily.   No current facility-administered medications for this visit. (Other)   REVIEW OF SYSTEMS:   ALLERGIES Allergies  Allergen Reactions   Sacubitril-Valsartan Anaphylaxis, Diarrhea and Nausea Only   Carvedilol  Other (See Comments)    Other reaction(s): diarrhea   Levofloxacin  Other (See Comments)    Red streaks to IV site when IV dose infusing and arm feeling very irritated, throbbing, painful   Methocarbamol Rash    Other reaction(s): Other   Tape     Can use paper tape, adhesive causes redness   Tramadol Other (See Comments)   PAST MEDICAL HISTORY Past Medical History:  Diagnosis Date   Anemia    Atrial fibrillation (HCC)    Axillary mass 01/1998   Left   Barrett's esophagus    Blood transfusion without  reported diagnosis    CARCINOMA, BREAST 1995   left   Cervical spinal stenosis    CHF (congestive heart failure) (HCC)    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   Headache(784.0)    HEMORRHOIDS    Hypertension    Malignant neoplasm of colon (HCC) 01/26/2005   Qualifier: History of   By: Drucilla BANANA, Pam       Personal history of chemotherapy    Personal history of radiation therapy    Stroke (HCC) 03/30/2022   Thyroid  disease    Past Surgical History:  Procedure Laterality Date   BIOPSY  08/10/2019   Procedure: BIOPSY;  Surgeon: Shila Gustav GAILS, MD;  Location: [facility];  Service: Endoscopy;;   BONE MARROW  TRANSPLANT  1999   BREAST LUMPECTOMY Left 11/95   left breast   BUNIONECTOMY  3/09, 10/09   right and left   COLON SURGERY     COLONOSCOPY     COLONOSCOPY WITH PROPOFOL  [name] 08/10/2019   Procedure: COLONOSCOPY WITH PROPOFOL ;  Surgeon: Shila Gustav GAILS, MD;  Location: [facility];  Service: Endoscopy;  Laterality: [name];   ERCP  01/21/2012   Procedure: ENDOSCOPIC RETROGRADE CHOLANGIOPANCREATOGRAPHY (ERCP);  Surgeon: Princella CHRISTELLA Nida, MD;  Location: [facility];  Service: Endoscopy;  Laterality: [name];   hemicolectomy  01/2005   right   HYSTEROSCOPY  08/30/94   INGUINAL HERNIA REPAIR     LEFT HEART CATH AND CORONARY ANGIOGRAPHY [name] 05/21/2018   Procedure: LEFT HEART CATH AND CORONARY ANGIOGRAPHY;  Surgeon: Mady Bruckner, MD;  [facility];  Service: Cardiovascular;  Laterality: [name];   NECK MASS EXCISION Left 7/99   ROTATOR CUFF REPAIR Right 09/08/13   full tear   TOTAL ABDOMINAL HYSTERECTOMY W/ BILATERAL SALPINGOOPHORECTOMY  1996   TUNNELED VENOUS CATHETER PLACEMENT  9/98   FAMILY HISTORY Family History  Problem Relation Age of Onset   Uterine cancer Mother    Heart attack Father    Colon polyps Son    Colon cancer Neg Hx    Esophageal cancer Neg Hx    Stomach cancer Neg Hx    Rectal cancer Neg Hx    SOCIAL HISTORY Social History   Tobacco Use   Smoking status: Former    Current packs/day: 0.00    Types: Cigarettes    Quit date: 12/20/1990    Years since quitting: 33.2   Smokeless tobacco: Never  Vaping Use   Vaping status: Never Used  Substance Use Topics   Alcohol use: Not Currently   Drug use: Never       OPHTHALMIC EXAM:  Not recorded    IMAGING AND PROCEDURES  Imaging and Procedures for 03/03/2024         ASSESSMENT/PLAN: No diagnosis found.  Age related macular degeneration, non-exudative, both eyes  - The incidence, anatomy, and pathology of dry AMD, risk of progression, and the AREDS and AREDS 2 study including smoking risks discussed  with patient.  - Recommend amsler grid monitoring  - f/u 3-4 months, DFE, OCT  2,3. Hypertensive retinopathy OU - discussed importance of tight BP control - monitor  4. Pseudophakia OU  - s/p CE/IOL OU (Dr. Deborrah -- [facility], June 2024)  - IOL in good position, doing well  - monitor  5. 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    No follow-ups on file.  There are no Patient Instructions on file for this visit.  Explained the diagnoses, plan, and follow up with the patient and they expressed understanding.  Patient expressed understanding of the importance of proper follow up care.   This document serves as a record of services personally performed by Redell JUDITHANN Hans, MD, PhD. It was created on their behalf by Almetta Pesa, an ophthalmic technician. The creation of this record is the provider's dictation and/or activities during the visit.    Electronically signed by: Almetta Pesa, OA, 02/23/24  2:38 PM   Redell JUDITHANN Hans, M.D., Ph.D. Diseases & Surgery of the Retina and Vitreous [facility] 03/03/2024   Abbreviations: M myopia (nearsighted); A astigmatism; H hyperopia (farsighted); P presbyopia; Mrx spectacle prescription;  CTL contact lenses; OD right eye; OS left eye; OU both eyes  XT exotropia; ET esotropia; PEK punctate epithelial keratitis; PEE punctate epithelial erosions; DES dry eye syndrome; MGD meibomian gland dysfunction; ATs artificial tears; PFAT's preservative free artificial tears; NSC nuclear sclerotic cataract; PSC posterior subcapsular cataract; ERM epi-retinal membrane; PVD posterior vitreous detachment; RD retinal detachment; DM diabetes mellitus; DR diabetic retinopathy; NPDR non-proliferative diabetic retinopathy; PDR proliferative diabetic  retinopathy; CSME clinically significant macular edema; DME diabetic macular edema; dbh dot blot hemorrhages; CWS cotton wool spot; POAG primary open angle glaucoma; C/D cup-to-disc ratio; HVF humphrey visual field; GVF goldmann visual field; OCT optical coherence tomography; IOP intraocular pressure; BRVO Branch retinal vein occlusion; CRVO central retinal vein occlusion; CRAO central retinal artery occlusion; BRAO branch retinal artery occlusion; RT retinal tear; SB scleral buckle; PPV pars plana vitrectomy; VH Vitreous hemorrhage; PRP panretinal laser photocoagulation; IVK intravitreal kenalog ; VMT vitreomacular traction; MH Macular hole;  NVD neovascularization of the disc; NVE neovascularization elsewhere; AREDS age related eye disease study; ARMD age related macular degeneration; POAG primary open angle glaucoma; EBMD epithelial/anterior basement membrane dystrophy; ACIOL anterior chamber intraocular lens; IOL intraocular lens; PCIOL posterior chamber intraocular lens; Phaco/IOL phacoemulsification with intraocular lens placement; PRK photorefractive keratectomy; LASIK laser assisted in situ keratomileusis; HTN hypertension; DM diabetes mellitus; COPD chronic obstructive pulmonary disease

## 2024-03-03 ENCOUNTER — Encounter (INDEPENDENT_AMBULATORY_CARE_PROVIDER_SITE_OTHER): Payer: Self-pay

## 2024-03-03 ENCOUNTER — Encounter (INDEPENDENT_AMBULATORY_CARE_PROVIDER_SITE_OTHER): Admitting: Ophthalmology

## 2024-03-08 ENCOUNTER — Other Ambulatory Visit: Payer: Self-pay | Admitting: Family Medicine

## 2024-03-08 DIAGNOSIS — Z1231 Encounter for screening mammogram for malignant neoplasm of breast: Secondary | ICD-10-CM

## 2024-03-17 ENCOUNTER — Ambulatory Visit: Payer: Self-pay | Admitting: Gastroenterology

## 2024-03-30 NOTE — Progress Notes (Signed)
 [facility] - Clinic Note  04/13/2024   CHIEF COMPLAINT Patient presents for Retina Follow Up  HISTORY OF PRESENT ILLNESS: Eileen [name] is a 78 y.o. female who presents to the clinic today for:  HPI     Retina Follow Up   Patient presents with  Dry AMD.  In both eyes.  This started 5 months ago.  Severity is moderate.  Duration of 5 months.  I, the attending physician,  performed the HPI with the patient and updated documentation appropriately.        Comments   Pt states vision has been the same. Pt denies FOL/pain. Pt does have some floaters that go back and forth. Pt is using ats 3-4 times per day.      Last edited by Valdemar Rogue, MD on 04/20/2024  6:17 PM.     Pt states she's doing well no changes. Taking Lutein and zinc  Referring physician: Arloa Elsie SAUNDERS, MD [phone] [address] [place],  [place] [address]  HISTORICAL INFORMATION:  Selected notes from the MEDICAL RECORD NUMBER Referred by Dr. Lamar Gaudy for ARMD eval LEE:  Ocular Hx- h/o CVA w/ visual field defect PMH-   CURRENT MEDICATIONS: No current outpatient medications on file. (Ophthalmic Drugs)   No current facility-administered medications for this visit. (Ophthalmic Drugs)   Current Outpatient Medications (Other)  Medication Sig   acetaminophen  (TYLENOL ) 500 MG tablet Take 500 mg by mouth every 6 (six) hours as needed for moderate pain or headache.   amiodarone  (PACERONE ) 200 MG tablet Take 200 mg by mouth. Takes Mon-Fri, not on weekends   apixaban (ELIQUIS) 5 MG TABS tablet Take 5 mg by mouth 2 (two) times daily.   aspirin  EC 81 MG tablet 1 tablet Orally Once a day   atorvastatin (LIPITOR) 80 MG tablet Take 80 mg by mouth daily.   calcium carbonate (SUPER CALCIUM) 1500 (600 Ca) MG TABS tablet Take 600 mg by mouth.   carvedilol  (COREG ) 6.25 MG tablet Take 6.25 mg by mouth.   chlorthalidone (HYGROTON) 25 MG tablet Take 12.5 mg by mouth daily.   Cholecalciferol 50 MCG  (2000 UT) TABS Take 2,000 Units by mouth.   levothyroxine (SYNTHROID) 88 MCG tablet TAKE ONE TABLET BY MOUTH EVERY MORNING ON AN EMPTY STOMACH for 90   losartan  (COZAAR ) 50 MG tablet Take 50 mg by mouth daily.   magnesium oxide (MAG-OX) 400 MG tablet Take by mouth.   Multiple Vitamin (MULTIVITAMIN) tablet Take 1 tablet by mouth daily.   potassium chloride  (KLOR-CON  M) 10 MEQ tablet Take 20 mEq by mouth. (Patient not taking: Reported on 02/02/2024)   spironolactone (ALDACTONE) 25 MG tablet Take 25 mg by mouth daily.   SUFLAVE  178.7 g SOLR Take 1 Dose by mouth. (Patient not taking: Reported on 02/02/2024)   vitamin B-12 (CYANOCOBALAMIN ) 500 MCG tablet Take 500 mcg by mouth See admin instructions. Take 500 mcg 5 days weekly, takes on non b complex days   vitamin C (ASCORBIC ACID) 500 MG tablet Take 500 mg by mouth 2 (two) times daily.    Vitamin D , Ergocalciferol , (DRISDOL ) 50000 UNITS CAPS Take 50,000 Units by mouth every Wednesday.    Vitamin D , Ergocalciferol , 50 MCG (2000 UT) CAPS Take by mouth. Daily except on Wednesday   vitamin E 400 UNIT capsule Take 400 Units by mouth daily.   No current facility-administered medications for this visit. (Other)   REVIEW OF SYSTEMS: ROS   Positive for: Neurological,  Cardiovascular, Eyes Last edited by Elnor Avelina RAMAN, COT on 04/13/2024  2:05 PM.      ALLERGIES Allergies  Allergen Reactions   Sacubitril-Valsartan Anaphylaxis, Diarrhea and Nausea Only   Carvedilol  Other (See Comments)    Other reaction(s): diarrhea   Levofloxacin  Other (See Comments)    Red streaks to IV site when IV dose infusing and arm feeling very irritated, throbbing, painful   Methocarbamol Rash    Other reaction(s): Other   Tape     Can use paper tape, adhesive causes redness   Tramadol Other (See Comments)   PAST MEDICAL HISTORY Past Medical History:  Diagnosis Date   Anemia    Atrial fibrillation (HCC)    Axillary mass 01/1998   Left   Barrett's esophagus    Blood  transfusion without reported diagnosis    CARCINOMA, BREAST 1995   left   Cervical spinal stenosis    CHF (congestive heart failure) (HCC)    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   Headache(784.0)    HEMORRHOIDS    Hypertension    Malignant neoplasm of colon (HCC) 01/26/2005   Qualifier: History of   By: Drucilla BANANA, Pam       Personal history of chemotherapy    Personal history of radiation therapy    Stroke (HCC) 03/30/2022   Thyroid  disease    Past Surgical History:  Procedure Laterality Date   BIOPSY  08/10/2019   Procedure: BIOPSY;  Surgeon: Shila Gustav GAILS, MD;  Location: [facility];  Service: Endoscopy;;   BONE MARROW TRANSPLANT  1999   BREAST LUMPECTOMY Left 11/95   left breast   BUNIONECTOMY  3/09, 10/09   right and left   COLON SURGERY     COLONOSCOPY     COLONOSCOPY WITH PROPOFOL  [name] 08/10/2019   Procedure: COLONOSCOPY WITH PROPOFOL ;  Surgeon: Shila Gustav GAILS, MD;  Location: [facility];  Service: Endoscopy;  Laterality: [name];   ERCP  01/21/2012   Procedure: ENDOSCOPIC RETROGRADE CHOLANGIOPANCREATOGRAPHY (ERCP);  Surgeon: Princella CHRISTELLA Nida, MD;  Location: [facility];  Service: Endoscopy;  Laterality: [name];   hemicolectomy  01/2005   right   HYSTEROSCOPY  08/30/94   INGUINAL HERNIA REPAIR     LEFT HEART CATH AND CORONARY ANGIOGRAPHY [name] 05/21/2018   Procedure: LEFT HEART CATH AND CORONARY ANGIOGRAPHY;  Surgeon: Mady Bruckner, MD;  Location: [facility];  Service: Cardiovascular;  Laterality: [name];   NECK MASS EXCISION Left 7/99   ROTATOR CUFF REPAIR Right 09/08/13   full tear   TOTAL ABDOMINAL HYSTERECTOMY W/ BILATERAL SALPINGOOPHORECTOMY  1996   TUNNELED VENOUS CATHETER PLACEMENT  9/98   FAMILY HISTORY Family History  Problem Relation Age of Onset   Uterine cancer Mother    Heart attack Father    Colon polyps Son    Colon cancer Neg Hx    Esophageal cancer Neg Hx    Stomach cancer Neg Hx    Rectal cancer Neg Hx    Breast cancer Neg Hx     SOCIAL HISTORY Social History   Tobacco Use   Smoking status: Former    Current packs/day: 0.00    Types: Cigarettes    Quit date: 12/20/1990    Years since quitting: 33.3   Smokeless tobacco: Never  Vaping Use   Vaping status: Never Used  Substance Use Topics   Alcohol use: Not Currently   Drug use: Never       OPHTHALMIC EXAM:  Base Eye Exam  Visual Acuity (Snellen - Linear)       Right Left   Dist [place] 20/25 -1 20/40   Dist ph [place] 20/25 +2 20/30 -1         Tonometry (Tonopen, 2:11 PM)       Right Left   Pressure 18 18         Pupils       Pupils Dark Light Shape React APD   Right PERRL 3 2 Round Brisk None   Left PERRL 3 2 Round Brisk None         Visual Fields       Left Right    Full Full         Extraocular Movement       Right Left    Full, Ortho Full, Ortho         Neuro/Psych     Oriented x3: Yes   Mood/Affect: Normal         Dilation     Both eyes: 1.0% Mydriacyl, 2.5% Phenylephrine  @ 2:12 PM           Slit Lamp and Fundus Exam     Slit Lamp Exam       Right Left   Lids/Lashes Dermatochalasis - upper lid Dermatochalasis - upper lid   Conjunctiva/Sclera White and quiet White and quiet   Cornea well healed cataract wound, tear film debris, mild corneal verticillata, 2+ PEE well healed cataract wound, tear film debris, mild corneal verticillata, 2+ fine PEE   Anterior Chamber deep and clear deep and clear   Iris Round and dilated Round and dilated   Lens PC IOL in good position, trace posterior capsular opacification PC IOL in good position, trace posterior capsular opacification   Anterior Vitreous mild syneresis mild syneresis         Fundus Exam       Right Left   Disc Pink and Sharp Pink and Sharp, no heme   C/D Ratio 0.3 0.3   Macula Flat, Good foveal reflex, fine drusen, RPE mottling and clumping, No heme or edema Flat, blunted foveal reflex, drusen, central PEDs, No heme or edema   Vessels  attenuated attenuated, mild tortuosity   Periphery Attached, No heme Attached, No heme           IMAGING AND PROCEDURES  Imaging and Procedures for 04/13/2024  OCT, Retina - OU - Both Eyes       Right Eye Quality was good. Central Foveal Thickness: 275. Progression has been stable. Findings include normal foveal contour, no IRF, no SRF, retinal drusen , pigment epithelial detachment, vitreomacular adhesion .   Left Eye Quality was good. Central Foveal Thickness: 281. Progression has been stable. Findings include normal foveal contour, no IRF, no SRF, retinal drusen , pigment epithelial detachment (Moderate PED centrally).   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 CME - cystoid macular edema. PED - pigment epithelial detachment. IRF - intraretinal fluid. SRF - subretinal fluid. EZ - ellipsoid zone. ERM - epiretinal membrane. ORA - outer retinal atrophy. ORT - outer retinal tubulation. SRHM - subretinal hyper-reflective material. IRHM - intraretinal hyper-reflective material           ASSESSMENT/PLAN:   ICD-10-CM   1. Early dry stage nonexudative age-related macular degeneration of both eyes  H35.3131 OCT, Retina - OU -  Both Eyes    2. Essential hypertension  I10     3. Hypertensive retinopathy of both eyes  H35.033     4. Pseudophakia, both eyes  Z96.1     5. History of CVA (cerebrovascular accident)  Z41.73      Age related macular degeneration, non-exudative, both eyes  - The incidence, anatomy, and pathology of dry AMD, risk of progression, and the AREDS and AREDS 2 study including smoking risks discussed with patient.  - Recommend amsler grid monitoring  - f/u 6-9 months, DFE, OCT  2,3. Hypertensive retinopathy OU - discussed importance of tight BP control - monitor  4. Pseudophakia OU  - s/p CE/IOL OU  (Dr. Deborrah -- [facility], June 2024)  - IOL in good position, doing well  - monitor  5. 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    Return for 6-9 months non exu ARMD OU, DFE, OCT.  There are no Patient Instructions on file for this visit.  Explained the diagnoses, plan, and follow up with the patient and they expressed understanding.  Patient expressed understanding of the importance of proper follow up care.   This document serves as a record of services personally performed by Redell JUDITHANN Hans, MD, PhD. It was created on their behalf by Wanda GEANNIE Keens, COT an ophthalmic technician. The creation of this record is the provider's dictation and/or activities during the visit.    Electronically signed by:  Wanda GEANNIE Keens, COT  04/20/24 6:19 PM  This document serves as a record of services personally performed by Redell JUDITHANN Hans, MD, PhD. It was created on their behalf by Almetta Pesa, an ophthalmic technician. The creation of this record is the provider's dictation and/or activities during the visit.    Electronically signed by: Almetta Pesa, OA, 04/20/24  6:19 PM  Redell JUDITHANN Hans, M.D., Ph.D. Diseases & Surgery of the Retina and Vitreous [facility] 04/13/2024  I have reviewed the above documentation for accuracy and completeness, and I agree with the above. Redell JUDITHANN Hans, M.D., Ph.D. 04/20/24 6:20 PM   Abbreviations: M myopia (nearsighted); A astigmatism; H hyperopia (farsighted); P presbyopia; Mrx spectacle prescription;  CTL contact lenses; OD right eye; OS left eye; OU both eyes  XT exotropia; ET esotropia; PEK punctate epithelial keratitis; PEE punctate epithelial erosions; DES dry eye syndrome; MGD meibomian gland dysfunction; ATs artificial tears; PFAT's  preservative free artificial tears; NSC nuclear sclerotic cataract; PSC posterior subcapsular cataract; ERM epi-retinal membrane; PVD posterior vitreous detachment; RD retinal detachment; DM diabetes mellitus; DR diabetic retinopathy; NPDR non-proliferative diabetic retinopathy; PDR proliferative diabetic retinopathy; CSME clinically significant macular edema; DME diabetic macular edema; dbh dot blot hemorrhages; CWS cotton wool spot; POAG primary open angle glaucoma; C/D cup-to-disc ratio; HVF humphrey visual field; GVF goldmann visual field; OCT optical coherence tomography; IOP intraocular pressure; BRVO Branch retinal vein occlusion; CRVO central retinal vein occlusion; CRAO central retinal artery occlusion; BRAO branch retinal artery occlusion; RT retinal tear; SB scleral buckle; PPV pars plana vitrectomy; VH Vitreous hemorrhage; PRP panretinal laser photocoagulation; IVK intravitreal kenalog ; VMT vitreomacular traction; MH Macular hole;  NVD neovascularization of the disc; NVE neovascularization elsewhere; AREDS age related eye disease study; ARMD age related macular degeneration; POAG primary open angle glaucoma; EBMD epithelial/anterior basement membrane dystrophy; ACIOL anterior  chamber intraocular lens; IOL intraocular lens; PCIOL posterior chamber intraocular lens; Phaco/IOL phacoemulsification with intraocular lens placement; PRK photorefractive keratectomy; LASIK laser assisted in situ keratomileusis; HTN hypertension; DM diabetes mellitus; COPD chronic obstructive pulmonary disease

## 2024-04-12 ENCOUNTER — Ambulatory Visit
Admission: RE | Admit: 2024-04-12 | Discharge: 2024-04-12 | Disposition: A | Discharge status: Home / Self Care | Source: Ambulatory Visit | Attending: Family Medicine | Admitting: Family Medicine

## 2024-04-12 DIAGNOSIS — Z1231 Encounter for screening mammogram for malignant neoplasm of breast: Secondary | ICD-10-CM

## 2024-04-13 ENCOUNTER — Ambulatory Visit (INDEPENDENT_AMBULATORY_CARE_PROVIDER_SITE_OTHER): Admitting: Ophthalmology

## 2024-04-13 ENCOUNTER — Encounter (INDEPENDENT_AMBULATORY_CARE_PROVIDER_SITE_OTHER): Payer: Self-pay | Admitting: Ophthalmology

## 2024-04-13 DIAGNOSIS — Z961 Presence of intraocular lens: Secondary | ICD-10-CM | POA: Diagnosis not present

## 2024-04-13 DIAGNOSIS — I10 Essential (primary) hypertension: Secondary | ICD-10-CM | POA: Diagnosis not present

## 2024-04-13 DIAGNOSIS — H353131 Nonexudative age-related macular degeneration, bilateral, early dry stage: Secondary | ICD-10-CM

## 2024-04-13 DIAGNOSIS — H35033 Hypertensive retinopathy, bilateral: Secondary | ICD-10-CM

## 2024-04-13 DIAGNOSIS — Z8673 Personal history of transient ischemic attack (TIA), and cerebral infarction without residual deficits: Secondary | ICD-10-CM

## 2024-04-15 ENCOUNTER — Other Ambulatory Visit: Payer: Self-pay | Admitting: Family Medicine

## 2024-04-15 DIAGNOSIS — R928 Other abnormal and inconclusive findings on diagnostic imaging of breast: Secondary | ICD-10-CM

## 2024-04-20 ENCOUNTER — Encounter (INDEPENDENT_AMBULATORY_CARE_PROVIDER_SITE_OTHER): Payer: Self-pay | Admitting: Ophthalmology

## 2024-04-27 ENCOUNTER — Ambulatory Visit
Admission: RE | Admit: 2024-04-27 | Discharge: 2024-04-27 | Disposition: A | Discharge status: Home / Self Care | Source: Ambulatory Visit | Attending: Family Medicine | Admitting: Family Medicine

## 2024-04-27 ENCOUNTER — Ambulatory Visit

## 2024-04-27 DIAGNOSIS — R928 Other abnormal and inconclusive findings on diagnostic imaging of breast: Secondary | ICD-10-CM

## 2024-08-12 ENCOUNTER — Telehealth: Payer: Self-pay

## 2024-08-12 NOTE — Telephone Encounter (Signed)
 Pt and spouse stopped by requesting a complete copy/letter for the insurance company to support their appeal of a denial decision. Documentation from the pts oncology history Thermon Lesches, MD; Amadeo Windell SAILOR., MD; and Morna Kendall) was faxed to demonstrate that pt is currently followed at the cancer center for observation only for prior cancers diagnosed between 1990-2006. Documentation faxed to [phone] twice.

## 2024-10-11 ENCOUNTER — Encounter (INDEPENDENT_AMBULATORY_CARE_PROVIDER_SITE_OTHER): Admitting: Ophthalmology

## 2024-10-28 ENCOUNTER — Inpatient Hospital Stay

## 2024-10-28 ENCOUNTER — Other Ambulatory Visit

## 2024-10-28 ENCOUNTER — Encounter: Admitting: Adult Health
